# Patient Record
Sex: Female | Born: 1955
Health system: Southern US, Community
[De-identification: ages and names within clinical notes are randomized; demographics above are authoritative.]

## PROBLEM LIST (undated history)

## (undated) DIAGNOSIS — T4145XA Adverse effect of unspecified anesthetic, initial encounter: Secondary | ICD-10-CM

## (undated) DIAGNOSIS — Z9889 Other specified postprocedural states: Secondary | ICD-10-CM

## (undated) DIAGNOSIS — G839 Paralytic syndrome, unspecified: Secondary | ICD-10-CM

## (undated) DIAGNOSIS — G5620 Lesion of ulnar nerve, unspecified upper limb: Secondary | ICD-10-CM

## (undated) DIAGNOSIS — H269 Unspecified cataract: Secondary | ICD-10-CM

## (undated) DIAGNOSIS — F329 Major depressive disorder, single episode, unspecified: Secondary | ICD-10-CM

## (undated) DIAGNOSIS — G14 Postpolio syndrome: Secondary | ICD-10-CM

## (undated) DIAGNOSIS — R2241 Localized swelling, mass and lump, right lower limb: Secondary | ICD-10-CM

## (undated) DIAGNOSIS — G709 Myoneural disorder, unspecified: Secondary | ICD-10-CM

## (undated) DIAGNOSIS — M359 Systemic involvement of connective tissue, unspecified: Secondary | ICD-10-CM

## (undated) DIAGNOSIS — F32A Depression, unspecified: Secondary | ICD-10-CM

## (undated) DIAGNOSIS — G5600 Carpal tunnel syndrome, unspecified upper limb: Secondary | ICD-10-CM

## (undated) DIAGNOSIS — Z8781 Personal history of (healed) traumatic fracture: Secondary | ICD-10-CM

## (undated) DIAGNOSIS — M8580 Other specified disorders of bone density and structure, unspecified site: Secondary | ICD-10-CM

## (undated) DIAGNOSIS — M542 Cervicalgia: Secondary | ICD-10-CM

## (undated) DIAGNOSIS — K219 Gastro-esophageal reflux disease without esophagitis: Secondary | ICD-10-CM

## (undated) DIAGNOSIS — E785 Hyperlipidemia, unspecified: Secondary | ICD-10-CM

## (undated) DIAGNOSIS — R011 Cardiac murmur, unspecified: Secondary | ICD-10-CM

## (undated) DIAGNOSIS — M1990 Unspecified osteoarthritis, unspecified site: Secondary | ICD-10-CM

## (undated) DIAGNOSIS — I2510 Atherosclerotic heart disease of native coronary artery without angina pectoris: Secondary | ICD-10-CM

## (undated) DIAGNOSIS — Z780 Asymptomatic menopausal state: Secondary | ICD-10-CM

## (undated) DIAGNOSIS — J45998 Other asthma: Secondary | ICD-10-CM

## (undated) DIAGNOSIS — Z952 Presence of prosthetic heart valve: Secondary | ICD-10-CM

## (undated) DIAGNOSIS — I350 Nonrheumatic aortic (valve) stenosis: Secondary | ICD-10-CM

## (undated) DIAGNOSIS — G4700 Insomnia, unspecified: Secondary | ICD-10-CM

## (undated) DIAGNOSIS — F419 Anxiety disorder, unspecified: Secondary | ICD-10-CM

## (undated) DIAGNOSIS — Z9989 Dependence on other enabling machines and devices: Secondary | ICD-10-CM

## (undated) DIAGNOSIS — G4733 Obstructive sleep apnea (adult) (pediatric): Secondary | ICD-10-CM

## (undated) DIAGNOSIS — K5730 Diverticulosis of large intestine without perforation or abscess without bleeding: Secondary | ICD-10-CM

## (undated) DIAGNOSIS — K5792 Diverticulitis of intestine, part unspecified, without perforation or abscess without bleeding: Secondary | ICD-10-CM

## (undated) DIAGNOSIS — G4730 Sleep apnea, unspecified: Secondary | ICD-10-CM

## (undated) DIAGNOSIS — T7840XA Allergy, unspecified, initial encounter: Secondary | ICD-10-CM

## (undated) DIAGNOSIS — R609 Edema, unspecified: Secondary | ICD-10-CM

## (undated) DIAGNOSIS — R112 Nausea with vomiting, unspecified: Secondary | ICD-10-CM

## (undated) DIAGNOSIS — T8859XA Other complications of anesthesia, initial encounter: Secondary | ICD-10-CM

## (undated) HISTORY — DX: Asymptomatic menopausal state: Z78.0

## (undated) HISTORY — PX: APPENDECTOMY: SHX54

## (undated) HISTORY — PX: ANKLE FUSION: SHX881

## (undated) HISTORY — DX: Lesion of ulnar nerve, unspecified upper limb: G56.20

## (undated) HISTORY — DX: Allergy, unspecified, initial encounter: T78.40XA

## (undated) HISTORY — PX: UMBILICAL HERNIA REPAIR: SHX196

## (undated) HISTORY — DX: Postpolio syndrome: G14

## (undated) HISTORY — DX: Anxiety disorder, unspecified: F41.9

## (undated) HISTORY — DX: Hyperlipidemia, unspecified: E78.5

## (undated) HISTORY — DX: Carpal tunnel syndrome, unspecified upper limb: G56.00

## (undated) HISTORY — DX: Other asthma: J45.998

## (undated) HISTORY — DX: Unspecified cataract: H26.9

## (undated) HISTORY — DX: Cervicalgia: M54.2

## (undated) HISTORY — DX: Systemic involvement of connective tissue, unspecified: M35.9

## (undated) HISTORY — DX: Insomnia, unspecified: G47.00

## (undated) HISTORY — DX: Edema, unspecified: R60.9

## (undated) HISTORY — PX: KNEE ARTHROPLASTY: SHX992

## (undated) HISTORY — DX: Personal history of (healed) traumatic fracture: Z87.81

## (undated) HISTORY — PX: COLONOSCOPY: SHX174

## (undated) HISTORY — DX: Diverticulitis of intestine, part unspecified, without perforation or abscess without bleeding: K57.92

## (undated) HISTORY — DX: Unspecified osteoarthritis, unspecified site: M19.90

## (undated) HISTORY — PX: CHOLECYSTECTOMY: SHX55

## (undated) HISTORY — PX: LEG SURGERY: SHX1003

## (undated) HISTORY — DX: Diverticulosis of large intestine without perforation or abscess without bleeding: K57.30

## (undated) HISTORY — DX: Sleep apnea, unspecified: G47.30

## (undated) HISTORY — PX: TOTAL HIP ARTHROPLASTY: SHX124

## (undated) HISTORY — DX: Depression, unspecified: F32.A

## (undated) HISTORY — DX: Other specified disorders of bone density and structure, unspecified site: M85.80

## (undated) HISTORY — DX: Major depressive disorder, single episode, unspecified: F32.9

## (undated) HISTORY — DX: Gastro-esophageal reflux disease without esophagitis: K21.9

---
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
## 1999-04-02 ENCOUNTER — Encounter: Payer: Self-pay | Admitting: Orthopedic Surgery

## 1999-04-06 ENCOUNTER — Encounter: Payer: Self-pay | Admitting: Orthopedic Surgery

## 1999-04-06 ENCOUNTER — Inpatient Hospital Stay (HOSPITAL_COMMUNITY): Admission: RE | Admit: 1999-04-06 | Discharge: 1999-04-09 | Payer: Self-pay | Admitting: Orthopedic Surgery

## 1999-04-09 ENCOUNTER — Inpatient Hospital Stay (HOSPITAL_COMMUNITY)
Admission: RE | Admit: 1999-04-09 | Discharge: 1999-04-21 | Payer: Self-pay | Admitting: Physical Medicine & Rehabilitation

## 2000-01-14 ENCOUNTER — Emergency Department (HOSPITAL_COMMUNITY): Admission: EM | Admit: 2000-01-14 | Discharge: 2000-01-14 | Payer: Self-pay | Admitting: Emergency Medicine

## 2000-01-14 ENCOUNTER — Encounter: Payer: Self-pay | Admitting: Emergency Medicine

## 2000-03-29 ENCOUNTER — Other Ambulatory Visit: Admission: RE | Admit: 2000-03-29 | Discharge: 2000-03-29 | Payer: Self-pay | Admitting: Internal Medicine

## 2000-10-05 ENCOUNTER — Inpatient Hospital Stay (HOSPITAL_COMMUNITY): Admission: EM | Admit: 2000-10-05 | Discharge: 2000-10-07 | Payer: Self-pay | Admitting: Emergency Medicine

## 2000-10-05 ENCOUNTER — Encounter: Payer: Self-pay | Admitting: Emergency Medicine

## 2000-10-05 ENCOUNTER — Encounter: Payer: Self-pay | Admitting: Orthopedic Surgery

## 2001-03-23 ENCOUNTER — Observation Stay (HOSPITAL_COMMUNITY): Admission: EM | Admit: 2001-03-23 | Discharge: 2001-03-24 | Payer: Self-pay | Admitting: Emergency Medicine

## 2001-03-23 ENCOUNTER — Encounter: Payer: Self-pay | Admitting: Orthopedic Surgery

## 2001-03-23 ENCOUNTER — Encounter: Payer: Self-pay | Admitting: *Deleted

## 2001-06-14 HISTORY — PX: JOINT REPLACEMENT: SHX530

## 2001-07-05 ENCOUNTER — Encounter: Payer: Self-pay | Admitting: Orthopedic Surgery

## 2001-07-05 ENCOUNTER — Inpatient Hospital Stay (HOSPITAL_COMMUNITY): Admission: RE | Admit: 2001-07-05 | Discharge: 2001-07-08 | Payer: Self-pay | Admitting: Orthopedic Surgery

## 2001-07-08 ENCOUNTER — Inpatient Hospital Stay (HOSPITAL_COMMUNITY)
Admission: RE | Admit: 2001-07-08 | Discharge: 2001-07-14 | Payer: Self-pay | Admitting: Physical Medicine & Rehabilitation

## 2002-01-27 ENCOUNTER — Inpatient Hospital Stay (HOSPITAL_COMMUNITY): Admission: EM | Admit: 2002-01-27 | Discharge: 2002-01-30 | Payer: Self-pay | Admitting: Emergency Medicine

## 2002-01-27 ENCOUNTER — Encounter: Payer: Self-pay | Admitting: Emergency Medicine

## 2002-03-14 ENCOUNTER — Encounter: Payer: Self-pay | Admitting: Physical Medicine & Rehabilitation

## 2002-03-14 ENCOUNTER — Ambulatory Visit (HOSPITAL_COMMUNITY)
Admission: RE | Admit: 2002-03-14 | Discharge: 2002-03-14 | Payer: Self-pay | Admitting: Physical Medicine & Rehabilitation

## 2002-03-19 ENCOUNTER — Encounter
Admission: RE | Admit: 2002-03-19 | Discharge: 2002-04-11 | Payer: Self-pay | Admitting: Physical Medicine & Rehabilitation

## 2002-07-26 ENCOUNTER — Encounter: Admission: RE | Admit: 2002-07-26 | Discharge: 2002-10-24 | Payer: Self-pay | Admitting: Psychology

## 2002-09-27 ENCOUNTER — Encounter: Payer: Self-pay | Admitting: Orthopedic Surgery

## 2002-10-03 ENCOUNTER — Ambulatory Visit (HOSPITAL_COMMUNITY): Admission: RE | Admit: 2002-10-03 | Discharge: 2002-10-04 | Payer: Self-pay | Admitting: Orthopedic Surgery

## 2002-11-09 ENCOUNTER — Encounter: Payer: Self-pay | Admitting: Gastroenterology

## 2002-11-09 ENCOUNTER — Ambulatory Visit (HOSPITAL_COMMUNITY): Admission: RE | Admit: 2002-11-09 | Discharge: 2002-11-09 | Payer: Self-pay | Admitting: Gastroenterology

## 2002-11-26 ENCOUNTER — Encounter: Payer: Self-pay | Admitting: Gastroenterology

## 2002-11-26 ENCOUNTER — Ambulatory Visit (HOSPITAL_COMMUNITY): Admission: RE | Admit: 2002-11-26 | Discharge: 2002-11-26 | Payer: Self-pay | Admitting: Gastroenterology

## 2002-12-03 ENCOUNTER — Encounter
Admission: RE | Admit: 2002-12-03 | Discharge: 2003-03-03 | Payer: Self-pay | Admitting: Physical Medicine & Rehabilitation

## 2002-12-14 ENCOUNTER — Encounter
Admission: RE | Admit: 2002-12-14 | Discharge: 2002-12-14 | Payer: Self-pay | Admitting: Physical Medicine & Rehabilitation

## 2003-01-22 ENCOUNTER — Encounter
Admission: RE | Admit: 2003-01-22 | Discharge: 2003-02-20 | Payer: Self-pay | Admitting: Physical Medicine & Rehabilitation

## 2003-08-16 ENCOUNTER — Encounter
Admission: RE | Admit: 2003-08-16 | Discharge: 2003-11-14 | Payer: Self-pay | Admitting: Physical Medicine & Rehabilitation

## 2003-09-25 ENCOUNTER — Other Ambulatory Visit: Admission: RE | Admit: 2003-09-25 | Discharge: 2003-09-25 | Payer: Self-pay | Admitting: Obstetrics and Gynecology

## 2004-05-30 ENCOUNTER — Encounter: Admission: RE | Admit: 2004-05-30 | Discharge: 2004-05-30 | Payer: Self-pay | Admitting: Orthopedic Surgery

## 2004-06-23 ENCOUNTER — Ambulatory Visit (HOSPITAL_COMMUNITY): Admission: RE | Admit: 2004-06-23 | Discharge: 2004-06-23 | Payer: Self-pay | Admitting: Gastroenterology

## 2004-06-23 ENCOUNTER — Encounter (INDEPENDENT_AMBULATORY_CARE_PROVIDER_SITE_OTHER): Payer: Self-pay | Admitting: *Deleted

## 2004-07-23 ENCOUNTER — Encounter
Admission: RE | Admit: 2004-07-23 | Discharge: 2004-10-21 | Payer: Self-pay | Admitting: Physical Medicine & Rehabilitation

## 2004-07-23 ENCOUNTER — Ambulatory Visit: Payer: Self-pay | Admitting: Physical Medicine & Rehabilitation

## 2004-10-29 ENCOUNTER — Encounter
Admission: RE | Admit: 2004-10-29 | Discharge: 2005-01-27 | Payer: Self-pay | Admitting: Physical Medicine & Rehabilitation

## 2004-11-02 ENCOUNTER — Ambulatory Visit: Payer: Self-pay | Admitting: Physical Medicine & Rehabilitation

## 2004-11-11 ENCOUNTER — Inpatient Hospital Stay (HOSPITAL_COMMUNITY): Admission: EM | Admit: 2004-11-11 | Discharge: 2004-11-17 | Payer: Self-pay | Admitting: Emergency Medicine

## 2004-11-11 ENCOUNTER — Ambulatory Visit: Payer: Self-pay | Admitting: Physical Medicine & Rehabilitation

## 2004-11-17 ENCOUNTER — Ambulatory Visit: Payer: Self-pay | Admitting: Physical Medicine & Rehabilitation

## 2004-11-17 ENCOUNTER — Inpatient Hospital Stay
Admission: RE | Admit: 2004-11-17 | Discharge: 2004-11-28 | Payer: Self-pay | Admitting: Physical Medicine & Rehabilitation

## 2004-12-17 ENCOUNTER — Encounter: Admission: RE | Admit: 2004-12-17 | Discharge: 2004-12-17 | Payer: Self-pay | Admitting: Orthopedic Surgery

## 2005-03-21 ENCOUNTER — Emergency Department (HOSPITAL_COMMUNITY): Admission: EM | Admit: 2005-03-21 | Discharge: 2005-03-21 | Payer: Self-pay | Admitting: Emergency Medicine

## 2005-04-14 ENCOUNTER — Other Ambulatory Visit: Admission: RE | Admit: 2005-04-14 | Discharge: 2005-04-14 | Payer: Self-pay | Admitting: Obstetrics and Gynecology

## 2005-04-15 ENCOUNTER — Ambulatory Visit: Admission: RE | Admit: 2005-04-15 | Discharge: 2005-04-15 | Payer: Self-pay | Admitting: Orthopedic Surgery

## 2005-05-26 ENCOUNTER — Emergency Department (HOSPITAL_COMMUNITY): Admission: EM | Admit: 2005-05-26 | Discharge: 2005-05-26 | Payer: Self-pay | Admitting: Emergency Medicine

## 2005-11-15 ENCOUNTER — Encounter: Admission: RE | Admit: 2005-11-15 | Discharge: 2005-11-15 | Payer: Self-pay | Admitting: Gastroenterology

## 2005-11-22 ENCOUNTER — Ambulatory Visit (HOSPITAL_COMMUNITY): Admission: RE | Admit: 2005-11-22 | Discharge: 2005-11-22 | Payer: Self-pay | Admitting: Gastroenterology

## 2006-03-23 ENCOUNTER — Inpatient Hospital Stay (HOSPITAL_COMMUNITY): Admission: AD | Admit: 2006-03-23 | Discharge: 2006-03-23 | Payer: Self-pay | Admitting: Obstetrics & Gynecology

## 2006-03-25 ENCOUNTER — Emergency Department (HOSPITAL_COMMUNITY): Admission: EM | Admit: 2006-03-25 | Discharge: 2006-03-25 | Payer: Self-pay | Admitting: Emergency Medicine

## 2006-06-01 ENCOUNTER — Encounter: Admission: RE | Admit: 2006-06-01 | Discharge: 2006-06-01 | Payer: Self-pay | Admitting: Internal Medicine

## 2006-07-04 ENCOUNTER — Encounter: Admission: RE | Admit: 2006-07-04 | Discharge: 2006-07-04 | Payer: Self-pay | Admitting: Internal Medicine

## 2006-07-24 ENCOUNTER — Emergency Department (HOSPITAL_COMMUNITY): Admission: EM | Admit: 2006-07-24 | Discharge: 2006-07-24 | Payer: Self-pay | Admitting: Emergency Medicine

## 2006-08-08 ENCOUNTER — Emergency Department (HOSPITAL_COMMUNITY): Admission: EM | Admit: 2006-08-08 | Discharge: 2006-08-08 | Payer: Self-pay | Admitting: Emergency Medicine

## 2006-08-16 ENCOUNTER — Ambulatory Visit: Payer: Self-pay | Admitting: Critical Care Medicine

## 2006-08-19 ENCOUNTER — Ambulatory Visit (HOSPITAL_COMMUNITY): Admission: RE | Admit: 2006-08-19 | Discharge: 2006-08-19 | Payer: Self-pay | Admitting: Critical Care Medicine

## 2006-08-29 ENCOUNTER — Inpatient Hospital Stay (HOSPITAL_COMMUNITY): Admission: EM | Admit: 2006-08-29 | Discharge: 2006-09-05 | Payer: Self-pay | Admitting: Emergency Medicine

## 2006-08-29 ENCOUNTER — Encounter (INDEPENDENT_AMBULATORY_CARE_PROVIDER_SITE_OTHER): Payer: Self-pay | Admitting: Specialist

## 2006-09-01 ENCOUNTER — Ambulatory Visit: Payer: Self-pay | Admitting: Physical Medicine & Rehabilitation

## 2006-10-07 ENCOUNTER — Encounter: Admission: RE | Admit: 2006-10-07 | Discharge: 2006-10-07 | Payer: Self-pay | Admitting: Surgery

## 2006-10-17 ENCOUNTER — Encounter: Admission: RE | Admit: 2006-10-17 | Discharge: 2007-01-15 | Payer: Self-pay | Admitting: Surgery

## 2006-10-28 ENCOUNTER — Inpatient Hospital Stay (HOSPITAL_COMMUNITY): Admission: RE | Admit: 2006-10-28 | Discharge: 2006-11-04 | Payer: Self-pay | Admitting: Surgery

## 2007-06-18 ENCOUNTER — Emergency Department (HOSPITAL_COMMUNITY): Admission: EM | Admit: 2007-06-18 | Discharge: 2007-06-18 | Payer: Self-pay | Admitting: Emergency Medicine

## 2007-06-28 ENCOUNTER — Inpatient Hospital Stay (HOSPITAL_COMMUNITY): Admission: RE | Admit: 2007-06-28 | Discharge: 2007-07-05 | Payer: Self-pay | Admitting: General Surgery

## 2007-08-08 ENCOUNTER — Observation Stay (HOSPITAL_COMMUNITY): Admission: AD | Admit: 2007-08-08 | Discharge: 2007-08-10 | Payer: Self-pay

## 2008-02-27 ENCOUNTER — Ambulatory Visit (HOSPITAL_COMMUNITY): Admission: RE | Admit: 2008-02-27 | Discharge: 2008-02-27 | Payer: Self-pay | Admitting: General Surgery

## 2008-03-15 ENCOUNTER — Encounter: Admission: RE | Admit: 2008-03-15 | Discharge: 2008-03-15 | Payer: Self-pay | Admitting: General Surgery

## 2008-07-07 ENCOUNTER — Emergency Department (HOSPITAL_COMMUNITY): Admission: EM | Admit: 2008-07-07 | Discharge: 2008-07-07 | Payer: Self-pay | Admitting: *Deleted

## 2009-02-28 ENCOUNTER — Encounter: Admission: RE | Admit: 2009-02-28 | Discharge: 2009-02-28 | Payer: Self-pay | Admitting: General Surgery

## 2009-03-05 ENCOUNTER — Encounter: Admission: RE | Admit: 2009-03-05 | Discharge: 2009-03-05 | Payer: Self-pay | Admitting: General Surgery

## 2009-03-10 ENCOUNTER — Encounter (INDEPENDENT_AMBULATORY_CARE_PROVIDER_SITE_OTHER): Payer: Self-pay | Admitting: General Surgery

## 2009-03-10 ENCOUNTER — Ambulatory Visit (HOSPITAL_COMMUNITY): Admission: RE | Admit: 2009-03-10 | Discharge: 2009-03-11 | Payer: Self-pay | Admitting: General Surgery

## 2009-09-15 ENCOUNTER — Encounter: Admission: RE | Admit: 2009-09-15 | Discharge: 2009-12-14 | Payer: Self-pay | Admitting: Internal Medicine

## 2009-10-08 ENCOUNTER — Ambulatory Visit: Payer: Self-pay | Admitting: Cardiology

## 2009-10-08 ENCOUNTER — Emergency Department (HOSPITAL_COMMUNITY): Admission: EM | Admit: 2009-10-08 | Discharge: 2009-10-09 | Payer: Self-pay | Admitting: Emergency Medicine

## 2009-10-09 ENCOUNTER — Encounter (INDEPENDENT_AMBULATORY_CARE_PROVIDER_SITE_OTHER): Payer: Self-pay | Admitting: Emergency Medicine

## 2010-06-03 ENCOUNTER — Inpatient Hospital Stay (HOSPITAL_COMMUNITY)
Admission: EM | Admit: 2010-06-03 | Discharge: 2010-06-05 | Payer: Self-pay | Source: Home / Self Care | Attending: Internal Medicine | Admitting: Internal Medicine

## 2010-07-07 ENCOUNTER — Encounter
Admission: RE | Admit: 2010-07-07 | Discharge: 2010-07-14 | Payer: Self-pay | Source: Home / Self Care | Attending: Orthopedic Surgery | Admitting: Orthopedic Surgery

## 2010-07-11 ENCOUNTER — Encounter
Admission: RE | Admit: 2010-07-11 | Discharge: 2010-07-11 | Payer: Self-pay | Source: Home / Self Care | Attending: Orthopedic Surgery | Admitting: Orthopedic Surgery

## 2010-08-24 LAB — HEPATIC FUNCTION PANEL
ALT: 18 U/L (ref 0–35)
AST: 22 U/L (ref 0–37)
Alkaline Phosphatase: 80 U/L (ref 39–117)
Bilirubin, Direct: 0.1 mg/dL (ref 0.0–0.3)
Indirect Bilirubin: 1 mg/dL — ABNORMAL HIGH (ref 0.3–0.9)

## 2010-08-24 LAB — CBC
HCT: 39 % (ref 36.0–46.0)
HCT: 45.7 % (ref 36.0–46.0)
Hemoglobin: 13 g/dL (ref 12.0–15.0)
Hemoglobin: 15.5 g/dL — ABNORMAL HIGH (ref 12.0–15.0)
MCH: 31.6 pg (ref 26.0–34.0)
MCH: 31.9 pg (ref 26.0–34.0)
MCHC: 33.3 g/dL (ref 30.0–36.0)
MCV: 94 fL (ref 78.0–100.0)
MCV: 94.7 fL (ref 78.0–100.0)
Platelets: 311 10*3/uL (ref 150–400)
RBC: 4.12 MIL/uL (ref 3.87–5.11)
RBC: 4.86 MIL/uL (ref 3.87–5.11)
WBC: 13 10*3/uL — ABNORMAL HIGH (ref 4.0–10.5)

## 2010-08-24 LAB — DIFFERENTIAL
Basophils Absolute: 0 10*3/uL (ref 0.0–0.1)
Eosinophils Absolute: 0.3 10*3/uL (ref 0.0–0.7)
Eosinophils Relative: 2 % (ref 0–5)
Lymphocytes Relative: 18 % (ref 12–46)
Lymphs Abs: 2.3 10*3/uL (ref 0.7–4.0)
Monocytes Absolute: 1 10*3/uL (ref 0.1–1.0)
Monocytes Relative: 8 % (ref 3–12)

## 2010-08-24 LAB — POCT I-STAT, CHEM 8
BUN: 10 mg/dL (ref 6–23)
Calcium, Ion: 1.03 mmol/L — ABNORMAL LOW (ref 1.12–1.32)
Chloride: 105 mEq/L (ref 96–112)
Creatinine, Ser: 0.7 mg/dL (ref 0.4–1.2)
Glucose, Bld: 124 mg/dL — ABNORMAL HIGH (ref 70–99)
Hemoglobin: 15.3 g/dL — ABNORMAL HIGH (ref 12.0–15.0)
Potassium: 4.1 mEq/L (ref 3.5–5.1)

## 2010-08-24 LAB — URINALYSIS, ROUTINE W REFLEX MICROSCOPIC
Glucose, UA: NEGATIVE mg/dL
Hgb urine dipstick: NEGATIVE
Ketones, ur: NEGATIVE mg/dL
Protein, ur: NEGATIVE mg/dL
pH: 5.5 (ref 5.0–8.0)

## 2010-08-24 LAB — COMPREHENSIVE METABOLIC PANEL
BUN: 6 mg/dL (ref 6–23)
CO2: 26 mEq/L (ref 19–32)
Calcium: 8.3 mg/dL — ABNORMAL LOW (ref 8.4–10.5)
Chloride: 106 mEq/L (ref 96–112)
Creatinine, Ser: 0.66 mg/dL (ref 0.4–1.2)
GFR calc non Af Amer: >60 mL/min (ref >60)
Glucose, Bld: 124 mg/dL — ABNORMAL HIGH (ref 70–99)
Total Bilirubin: 0.6 mg/dL (ref 0.3–1.2)

## 2010-09-01 LAB — DIFFERENTIAL
Basophils Absolute: 0 10*3/uL (ref 0.0–0.1)
Basophils Relative: 1 % (ref 0–1)
Eosinophils Absolute: 0.3 K/uL (ref 0.0–0.7)
Eosinophils Relative: 4 % (ref 0–5)
Lymphocytes Relative: 31 % (ref 12–46)
Lymphs Abs: 2.3 10*3/uL (ref 0.7–4.0)
Monocytes Absolute: 0.5 10*3/uL (ref 0.1–1.0)
Monocytes Relative: 7 % (ref 3–12)
Neutro Abs: 4.3 10*3/uL (ref 1.7–7.7)
Neutrophils Relative %: 57 % (ref 43–77)

## 2010-09-01 LAB — POCT CARDIAC MARKERS
CKMB, poc: 2.3 ng/mL (ref 1.0–8.0)
CKMB, poc: 3 ng/mL (ref 1.0–8.0)
CKMB, poc: 3 ng/mL (ref 1.0–8.0)
Myoglobin, poc: 128 ng/mL (ref 12–200)
Myoglobin, poc: 139 ng/mL (ref 12–200)
Myoglobin, poc: 158 ng/mL (ref 12–200)
Troponin i, poc: <0.05 ng/mL (ref 0.00–0.09)
Troponin i, poc: <0.05 ng/mL (ref 0.00–0.09)
Troponin i, poc: <0.05 ng/mL (ref 0.00–0.09)

## 2010-09-01 LAB — CBC
HCT: 45.6 % (ref 36.0–46.0)
Hemoglobin: 16 g/dL — ABNORMAL HIGH (ref 12.0–15.0)
MCHC: 35.2 g/dL (ref 30.0–36.0)
MCV: 92.7 fL (ref 78.0–100.0)
Platelets: 288 K/uL (ref 150–400)
RBC: 4.92 MIL/uL (ref 3.87–5.11)
RDW: 12.9 % (ref 11.5–15.5)
WBC: 7.5 10*3/uL (ref 4.0–10.5)

## 2010-09-01 LAB — POCT I-STAT, CHEM 8
BUN: 10 mg/dL (ref 6–23)
Calcium, Ion: 1.08 mmol/L — ABNORMAL LOW (ref 1.12–1.32)
Chloride: 108 mEq/L (ref 96–112)
Creatinine, Ser: 0.3 mg/dL — ABNORMAL LOW (ref 0.4–1.2)
Glucose, Bld: 101 mg/dL — ABNORMAL HIGH (ref 70–99)
HCT: 49 % — ABNORMAL HIGH (ref 36.0–46.0)
Hemoglobin: 16.7 g/dL — ABNORMAL HIGH (ref 12.0–15.0)
Potassium: 4.2 meq/L (ref 3.5–5.1)
Sodium: 140 meq/L (ref 135–145)
TCO2: 24 mmol/L (ref 0–100)

## 2010-09-18 LAB — CBC
HCT: 43.2 % (ref 36.0–46.0)
Hemoglobin: 14.8 g/dL (ref 12.0–15.0)
MCHC: 34.3 g/dL (ref 30.0–36.0)
MCV: 93.9 fL (ref 78.0–100.0)
Platelets: 336 K/uL (ref 150–400)
RBC: 4.6 MIL/uL (ref 3.87–5.11)
RDW: 13 % (ref 11.5–15.5)
WBC: 7.7 K/uL (ref 4.0–10.5)

## 2010-09-18 LAB — COMPREHENSIVE METABOLIC PANEL
Alkaline Phosphatase: 91 U/L (ref 39–117)
BUN: 10 mg/dL (ref 6–23)
Chloride: 103 mEq/L (ref 96–112)
Glucose, Bld: 99 mg/dL (ref 70–99)
Potassium: 4 mEq/L (ref 3.5–5.1)
Total Bilirubin: 0.6 mg/dL (ref 0.3–1.2)

## 2010-09-18 LAB — DIFFERENTIAL
Basophils Absolute: 0 K/uL (ref 0.0–0.1)
Basophils Relative: 0 % (ref 0–1)
Eosinophils Absolute: 0.3 K/uL (ref 0.0–0.7)
Eosinophils Relative: 4 % (ref 0–5)
Lymphocytes Relative: 30 % (ref 12–46)
Lymphs Abs: 2.3 K/uL (ref 0.7–4.0)
Monocytes Absolute: 0.5 K/uL (ref 0.1–1.0)
Monocytes Relative: 6 % (ref 3–12)
Neutro Abs: 4.6 K/uL (ref 1.7–7.7)
Neutrophils Relative %: 59 % (ref 43–77)

## 2010-10-27 NOTE — Op Note (Signed)
NAMECALLE, SCHADER             ACCOUNT NO.:  [id number]   MEDICAL RECORD NO.:  [id number]          PATIENT TYPE:  INP   LOCATION:  0003                         FACILITY:  [facility]   PHYSICIAN:  Sharlet Salina T. Hoxworth, M.D.DATE OF BIRTH:  12-07-1955   DATE OF PROCEDURE:  06/28/2007  DATE OF DISCHARGE:                               OPERATIVE REPORT   PREOPERATIVE DIAGNOSIS:  Recurrent ventral incisional hernia.   POSTOPERATIVE DIAGNOSIS:  Recurrent ventral incisional hernia.   SURGICAL PROCEDURES:  Repair of recurrent ventral incisional hernia.   SURGEON:  Lorne Skeens. Hoxworth, M.D.   ANESTHESIA:  General.   BRIEF HISTORY:  [name] is a 55 year old female with  comorbidities of postpolio syndrome and obesity, who is status post  emergency appendectomy requiring an open incision.  Early on following  this procedure, she developed wound dehiscence that was repaired with  mesh by Dr. Daphine Deutscher.  She did well for a number of months, but now  presents with a painful recurrent bulge in the right lower quadrant of  her abdomen.  Examination reveals a fullness and tenderness here and CT  scan has shown a recurrent ventral hernia containing fat with no bowel  or obstruction.  I have recommended proceeding with repair using mesh.  The nature of the procedure, indications, risks of bleeding, infection,  recurrence and cardiorespiratory complications have been discussed and  understood.  She is now brought to the operating room for this  procedure.   DESCRIPTION OF OPERATION:  The patient was brought to the operating room  and placed in supine position on the operating table and general  endotracheal anesthesia was induced.  She received preoperative IV  antibiotics.  Lovenox 40 mg was given subcutaneously.  Foley catheter  was placed.  The abdomen was widely sterilely prepped and draped.  Correct patient and procedure were verified.  The previous transverse  right lower quadrant  incision was used and extended somewhat and  dissection was carried down through the subcutaneous tissue and scar.  A  hernia sac was encountered and was opened and contained omentum.  The  mesh was essentially floating on the top of the hernia sac, having come  away from the abdominal wall.  The hernia sac and mesh were completely  excised back to healthy fascial edges.  Omental adhesions were taken  down from the wound edges and then completely lysed from the abdominal  wall intra-abdominally.  There were no significant bowel adhesions.  This left a defect of approximately 5 x 6 cm in diameter.  I elected to  repair this with an intra-abdominal piece of Kugel mesh.  A 17.5 x 13.5-  cm Kugel mesh was chosen and placed intra-abdominal and oriented  transversely; this allowed coverage far back from the edges in all  directions.  The mesh was then sutured up to the abdominal wall  internally with a number of #1 Prolene pop-offs circumferentially around  the edge of the mesh.  Following this, the Endo tacker was then used to  further secure the edge of the mesh up to the anterior abdominal wall  circumferentially with  no gaps remaining.  Again, this provided nice  broad coverage a number centimeters back in all directions.  Following  this, the fascia closed without any tension over the mesh and this was  performed with running #1 PDS, incorporating the mesh into a few bites  of the suture to close any dead space and anchor the center of the mesh.  The subcu tissue was  irrigated with antibiotic solution.  The subcu was closed with a running  2-0 Monocryl.  Skin was closed with staples.  Sponge, needle and  instrument count were correct.  Dry sterile dressing and a binder were  placed and the patient taken to Recovery in good condition.      Lorne Skeens. Hoxworth, M.D.  Electronically Signed     BTH/MEDQ  D:  06/28/2007  T:  06/28/2007  Job:  [id number]

## 2010-10-27 NOTE — Discharge Summary (Signed)
[name]             ACCOUNT NO.:  [id number]   MEDICAL RECORD NO.:  [id number]          PATIENT TYPE:  INP   LOCATION:  1536                         FACILITY:  [facility]   PHYSICIAN:  Sharlet Salina T. Hoxworth, M.D.DATE OF BIRTH:  July 01, 1955   DATE OF ADMISSION:  06/28/2007  DATE OF DISCHARGE:  07/05/2007                               DISCHARGE SUMMARY   DISCHARGE DIAGNOSIS:  Recurrent ventral incisional hernia.   SURGICAL PROCEDURES:  Repair of recurrent ventral incisional hernia on  January 14.   HISTORY OF PRESENT ILLNESS:  [name] is a 55 year old female  with post polio syndrome and limited use of her lower extremities.  She  had an open appendectomy with Dr. Daphine Deutscher in March 2008 and developed an  early incisional hernia, repaired open with mesh in May 2008.  About 2  or 3 weeks prior to this admission, when transferring herself, which  requires a fair amount of abdominal force, she felt the sudden onset of  an uncomfortable bulge in her right lower quadrant which has been  prominent ever since.  CT scan was performed when she presented to the  emergency room, which showed a recurrent incisional hernia in the right  lower quadrant.  Examination in the office confirmed this and she is now  electively admitted for repair.   PAST MEDICAL HISTORY:  History of polio as an infant with post polio  syndrome.  She has essentially complete paralysis of her left lower  extremity and very limited use of her right lower extremity due to hip  fusion and multiple procedures.  Other medical problems include obesity,  mild depression.  Previous surgery includes 3 hip replacements on the  right, 2 ankle fusions of the left lower extremity, 4 knee surgeries of  the left lower extremity, and as described in the HPI.   Medications are Cymbalta 60 daily, trazodone 150 at night.   ALLERGIES:  SULFA ANTIBIOTIC.   For social history, family history, review of systems, see detailed  H&P.   PERTINENT PHYSICAL EXAMINATION:  VITAL SIGNS:  Afebrile.  Vital signs  within normal limits.  GENERAL:  Obese female in no acute distress.  ABDOMEN:  Shows a well-healed right lower quadrant incision with a  fairly large palpable mass at the abdominal wall level.   CT scan has shown a large recurrent ventral incisional hernia in the  right lower quadrant containing omentum, preperitoneal fat with no  bowel.   HOSPITAL COURSE:  The patient was admitted on the morning of her  procedure and underwent open repair of her recurrent ventral hernia with  intra-abdominal composite, Kugel mesh.  She tolerated the procedure  well.  There were no complications postoperatively.  Her discharge was  somewhat slow due to her post polio syndrome and difficulty with  mobility.  She received some help from physical therapy and gradually  was able to become mobile on transfer  in and out of bed to her chair.  Her wound is healing primarily.  She is  discharged home on July 04, 2007.  Discharge medications are the same  as admission plus  Tylox for pain.  Followup will be in my office in 2 to  3 weeks.      Lorne Skeens. Hoxworth, M.D.  Electronically Signed     BTH/MEDQ  D:  07/27/2007  T:  07/28/2007  Job:  [id number]

## 2010-10-27 NOTE — Discharge Summary (Signed)
[name], [name]             ACCOUNT NO.:  [id number]   MEDICAL RECORD NO.:  [id number]          PATIENT TYPE:  OBV   LOCATION:  6706                         FACILITY:  [facility]   PHYSICIAN:  Hettie Holstein, D.O.    DATE OF BIRTH:  03/14/1956   DATE OF ADMISSION:  08/08/2007  DATE OF DISCHARGE:  08/10/2007                               DISCHARGE SUMMARY   PRIMARY CARE PHYSICIAN:  Cala Bradford R. Renae Gloss, M.D.   FINAL DIAGNOSES:  1. Inability to perform ADLs due to severe tendonitis and underlying      physical disabilities.  2. 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  3. History of anxiety syndrome.  4. Cervical radiculopathy.  5. Right arm tendonitis, due to overuse.  6. Recent abdominal surgeries over the past 10 months, relating to      recurrent ventral incisional hernia following a complicated course      for acute appendicitis in March 2008.   HISTORY OF PRESENT ILLNESS:  Please refer to the full history and  physical, as dictated by Dr. Andi Devon on August 08, 2007.  However briefly, [name] was admitted directly to 6700 unit from  Dr. Augustin Schooling office, with full H&P and orders on the record  for an observation stay; due to [name]'s inability to perform her  ADLs due to overuse syndrome and tendonitis of her right arm; followed  in the outpatient setting by Dr. Penni Bombard as well as Dr. Renae Gloss.  Symptoms have been refractory and difficult to manage, due to her  wheelchair-bound status and overall dependence on this upper extremity  for the performance of her ADLs.   HOSPITAL COURSE:  [name] was admitted with admission orders from  the office.  In any event, we are asked to follow her daily course, and  this was essentially uneventful.  Her arm remained in a sling.  We  requested the assistance of and evaluation from physical therapy,  occupational therapy.  At this juncture, she is scheduled  for transition  to [facility].   MEDICATIONS:  1. Prilosec 40 mg one to two daily.  2. Ibuprofen 800 mg p.o. t.i.d. p.r.n.  3. Darvocet N 100 one to two tablets p.o. q.6 h. p.r.n.  4. Cymbalta 60 mg daily.  5. Desyrel 150 mg p.o. q.h.s.  6. She states she is on an over-the-counter medication Alizn, which      she can also continue on a daily basis.     Hettie Holstein, D.O.  Electronically Signed    ESS/MEDQ  D:  08/10/2007  T:  08/10/2007  Job:  [id number]   cc:   Merlene Laughter. Renae Gloss, M.D.

## 2010-10-30 NOTE — Op Note (Signed)
[facility]  Patient:    [name], [name] Visit Number: [id number] MRN: [id number]          Service Type: [facility] Location: [id number] Attending Physician:  Faith Rogue T Dictated by:   Ollen Gross, M.D. Proc. Date: 07/05/01 Admit Date:  07/08/2001                             Operative Report  PREOPERATIVE DIAGNOSIS:  Recurrent dislocation, right hip.  POSTOPERATIVE DIAGNOSIS:  Recurrent dislocation, right hip.  OPERATION PERFORMED:  Right acetabular revision to constrained system.  SURGEON:  Ollen Gross, M.D.  ASSISTANT:  ANESTHESIA:  General.  ESTIMATED BLOOD LOSS:  800 cc.  DRAINS:  Hemovac x 1.  COMPLICATIONS:  None.  CONDITION:  Stable to recovery.  INDICATIONS FOR PROCEDURE:  [name] is a 55 year old female who has postpolio syndrome in the right lower extremity.  She initially had a total hip done by Dr. Simonne Gonzales in 1992 and subsequently had acetabular polyethylene wear and femoral loosening requiring a femoral revision three years ago. The acetabulum was not changed at that point.  She has had two dislocations secondary to increased weakness in the leg.  She presents now for revision.  DESCRIPTION OF PROCEDURE:  After successful administration of general anesthetic, the patient was placed in left lateral decubitus position with the right side up and help with hip positioner.   The right lower extremity was isolated from the perineum with plastic drapes and prepped and draped in the usual sterile fashion.  A posterolateral incision was made with a 10 blade through the subcutaneous tissue to the level of the fascia lata which was incised in line with the skin incision.  The sciatic nerve was palpated and protected.  Charnley retractor placed and soft tissue isolated off the posterior femur.  The pseudocapsule was excised to get circumferential exposure around the acetabulum.  The hip was dislocated and the femoral  head removed.  The whole thing was retracted anteriorly.  We removed the acetabular liner and the cut was really in excellent position.  Given that it is only a 48 mm cup, it is too small for a constrained liner and thus we had to go to a larger head with a thin liner or revise the entire acetabular shell to a larger one and put a constrained liner.  Given her significant motor muscle weakness, I decided to go ahead and removed the metal shell and replace it with a new one and put the constrained liner in.  The shell was circumferentially exposed and then the Moreland osteotomes were used to disrupt the bone prosthesis interface and the prosthesis was removed.  There was minimal bone loss.  She does have a small central defect but the anterior and posterior columns were fine.  We then reamed the rim up to 58 mm.  We used central allograft bone grafting for the acetabular floor and then I put a 56 mm reamer in reverse which led to a great concentric acetabular appearance. The 58 mm PSL acetabular shell was then impacted into anatomic position and transfixed with four domed screws. We had a great stable fit.  The constrained liner for the 28 mm head was then impacted into the acetabular shell and found to be well seated.  We then put a 28 +0 head on to her SROM femoral stem and reduced it.  There was no evidence of any torquing  of the composite.  The wound was then copiously irrigated.  Fascia lata closed over the Hemovac drain with #1 Vicryl subcutaneous closed with #1 and 2-0 Vicryl.  Skin closed with staples.  Drains left to suction.  Incision was clean and dry and a sterile bulky dressing was applied.  The patient was subsequently awakened and transported to recovery in stable condition. Dictated by:   Ollen Gross, M.D. Attending Physician:  Faith Rogue T DD:  07/05/01 TD:  07/06/01 Job: [id number]

## 2010-10-30 NOTE — Assessment & Plan Note (Signed)
[facility]                             PULMONARY OFFICE NOTE   NAME:Helmuth, CHIVONNE                      MRN:          [id number]  DATE:08/16/2006                            DOB:          12/08/1955    CHIEF COMPLAINT:  Evaluate dyspnea.   HISTORY OF PRESENT ILLNESS:  A 55 year old, white, female, wheelchair-  bound patient with post-polio syndrome whose had dyspnea with exertion  and at rest and a productive cough. The symptoms began in November of  2007 after an upper respiratory tract infection and there has been no  change every since. Initially she was treated with prednisone,  antibiotics and cough syrup, later given an inhaler Spiriva. Two rounds  of prednisone were 1 week and 2 weeks respectively. No change with this.  She did have increased acid symptomatology and took Prevacid regularly  on a once daily basis. She is still having breakthrough reflux with the  Prevacid despite being on this and having her head of bed elevated. She  has been worked up by Dr. Charna Elizabeth in the past for esophageal  dysmotility and dysphagia. She has had an informal swallowing evaluation  in the past about a year ago but none recently. She has been wheelchair-  bound because of her post-poli syndrome. She had polio at the age of 1  and had recurrence of her symptoms in 9 with increased fatigue. She  has had no real dietary changes. She has headaches, sneezing, itching,  earaches, hand and foot swelling. She is referred for further  evaluation. She is a lifelong never smoker.   PAST MEDICAL HISTORY:  No history of hypertension, heart disease,  asthma, diabetes, hypercholesterolemia, stroke or other significant  abnormalities. The patient is noted to have increased hypersomnolence  and excess snoring is noted.   PAST SURGICAL HISTORY:  Eight left leg surgeries in the past and 3  surgeries on the right leg and hip replacements are noted.   MEDICATION  ALLERGIES:  SULFA DRUGS.   CURRENT MEDICATIONS:  1. Cymbalta 60 mg daily.  2. Prevacid 30 mg daily.  3. Prempro daily.  4. Trazodone 150 mg daily.   SOCIAL HISTORY:  The patient is a lifelong never smoker, lives alone,  disabled.   FAMILY HISTORY:  Asthma in her father, heart disease in her father,  mother who had breast cancer.   PHYSICAL EXAMINATION:  VITAL SIGNS:  Temperature 97.9, blood pressure  120/88, pulse 91, saturation 98% on room air.  CHEST:  Showed distant breath sounds with no wheeze or rhonchi.  CARDIAC:  Showed a regular rate and rhythm without S3, normal S1, S2.  ABDOMEN:  Soft, nontender.  EXTREMITIES:  Showed no edema, clubbing or venous disease.  SKIN:  Clear.  NEUROLOGIC:  Intact except for paresis in the lower extremities.  HEENT:  Showed nares to be clear, oropharynx clear.   LABORATORY DATA:  Chest x-ray was obtained and reviewed and showed lower  lung atelectasis with diaphragm elevation. CT scan of the chest had  already been obtained on August 09, 2006 and showed no evidence of  pulmonary embolism but a small amount of right lung atelectasis.  Pulmonary functions are not available.   IMPRESSION:  1. Hypersomnolence, rule out obstructive sleep apnea with morbid      obesity.  2. Rule out chronic aspiration with dysphagia and bulbar dysfunction      or post-polio syndrome.  3. Significant acid reflux with vocal cord dysfunction syndrome likely      contributing to the levels of dyspnea.   RECOMMENDATIONS:  Obtain a full sleep study, modified barium swallow,  full pulmonary function studies and switch Prevacid to Zegerid at 40 mg  daily. Once the results of the above studies are available, further  recommendations will follow.     Charlcie Cradle Delford Field, MD, [facility]  Electronically Signed    PEW/MedQ  DD: 08/17/2006  DT: 08/17/2006  Job #: [id number]   cc:   Merlene Laughter. Renae Gloss, M.D.

## 2010-10-30 NOTE — Discharge Summary (Signed)
[name], [name]             ACCOUNT NO.:  [id number]   MEDICAL RECORD NO.:  [id number]          PATIENT TYPE:  INP   LOCATION:  1612                         FACILITY:  [facility]   PHYSICIAN:  Alfonse Ras, MD   DATE OF BIRTH:  06-Sep-1955   DATE OF ADMISSION:  10/28/2006  DATE OF DISCHARGE:  11/04/2006                               DISCHARGE SUMMARY   ADMISSION DIAGNOSIS:  Incisional hernia.   DISPOSITION:  Discharged to home.   CONDITION:  Improved.   HOSPITAL COURSE:  The patient was admitted and underwent laparoscopic-  assisted right lower quadrant incisional hernia repair.  The patient  suffers from postpolio syndrome and so was maintained in the hospital  for physical therapy to help her mobilize.  She was tolerating a regular  diet.  IV fluids have been removed.  She was tolerating p.o. pain  medication and by postoperative day #7, she was ready for discharge  home.  She can follow up with Dr. Daphine Deutscher in 2 weeks.      Alfonse Ras, MD  Electronically Signed     KRE/MEDQ  D:  11/04/2006  T:  11/04/2006  Job:  [id number]

## 2010-10-30 NOTE — Op Note (Signed)
NAMEVIKTORIA, Gonzales             ACCOUNT NO.:  [id number]   MEDICAL RECORD NO.:  [id number]          PATIENT TYPE:  INP   LOCATION:  5727                         FACILITY:  [facility]   PHYSICIAN:  Thornton Park. Daphine Deutscher, MD  DATE OF BIRTH:  [birth year]   DATE OF PROCEDURE:  08/29/2006  DATE OF DISCHARGE:                               OPERATIVE REPORT   PREOPERATIVE DIAGNOSIS:  Acute retrocecal appendicitis.   POSTOPERATIVE DIAGNOSIS:  Acute retrocecal appendicitis.   PROCEDURE:  Laparoscopic appendectomy followed by open right lower  quadrant laparotomy to expose and control retrocecal bleeding.   SURGEON:  Thornton Park. Daphine Deutscher, MD   ANESTHESIA:  General endotracheal.   ESTIMATED BLOOD LOSS:  600 mL cumulative.   DESCRIPTION OF PROCEDURE:  [name] is a 55 year old lady with post  polio syndrome who had about a day long history of increasing abdominal  pain and a retrocecal appendix seen on CT scan.  It looked like it went  more medial and up toward the right kidney.  I prepped her with [facility] and draped her sterilely.  I went through the umbilicus and through  a little hernia that I found.  I out the Hasson in. I insufflated and  placed a five in the right upper quadrant and 11 obliquely in the left  lower quadrant.  I then went over to the cecum and grasped it and  followed it to the base and found the base of the appendix and then with  gentle traction began mobilizing it.  I came down along the side with a  Harmonic and did begin mobilizing it and then found my way to create a  window below the stump.  When I did that I then was able to fire across  the stump with the Endo-GIA.   With the stump of the appendix elevated.  I then went to the mesentery.  This where it was very fatty and very stuck posteriorly and as I was  coming through with the Harmonic I began getting into some active  bleeding in the retrocecal region.  I controlled this with multiple  clips and one  proved to be more elusive.  I eventually felt like I had  controlled it and continued and was able to mobilize the tip and get the  appendix out and put it in a bag.  I went back and studied it and there  was no evidence of bleeding.  I then went back and looked again, no  bleeding.  I irrigated and cleared out the hemorrhage which had been  there during the initial bleeding episode.  After I had closed my  umbilical port, I went back for a third look and there was some fresh  blood so I then mobilized it again and found that this was very  retrocecal and despite every best attempt with the 30 degrees scope and  with an extra port in the right lower quadrant, I was unable to  satisfactorily control this with the Harmonic scalpel or with clips.  I  then elected making a right lower quadrant incision.  Unfortunately, she  is obese woman and I had to create a Fowler-Weir type incision and then  once doing this, I opened and roll the cecum over and ligated two  parallel longitudinal vessels coursing what would be beneath the  appendix and appendiceal mesentery.  I also applied FloSeal and this  seemed to control the retroperitoneal hemorrhage.  I irrigated and  looked at it again and it looked dry.  I brought an omental pack down  over this region and then closed the wound with interrupted #1 Novofils.  Wounds were irrigated and closed with staples.  The patient was taken to  recovery room in satisfactory addition.      Thornton Park Daphine Deutscher, MD  Electronically Signed     MBM/MEDQ  D:  08/29/2006  T:  08/30/2006  Job:  [id number]

## 2010-10-30 NOTE — Op Note (Signed)
NAME:  [name], [name]             ACCOUNT NO.:  [id number]   MEDICAL RECORD NO.:  [id number]          PATIENT TYPE:  AMB   LOCATION:  ENDO                         FACILITY:  [facility]   PHYSICIAN:  Anselmo Rod, M.D.  DATE OF BIRTH:  02-05-1956   DATE OF PROCEDURE:  11/22/2005  DATE OF DISCHARGE:                                 OPERATIVE REPORT   PROCEDURE:  Esophagogastroduodenoscopy.   ENDOSCOPIST:  Anselmo Rod, M.D.   INSTRUMENT USED:  Olympus video panendoscope.   INDICATIONS FOR PROCEDURE:  This is a 55 year old white female with a  history of polio, undergoing an EGD for an abnormal barium swallow, question  distal esophageal stricture.  There was some question of an esophageal spasm  and dysmotility on a barium swallow.   PRE-PROCEDURE PREPARATION:  An informed consent was procured form the  patient and the patient was fasted for four hours prior to the procedure.  The risks and benefits of the procedure were discussed in great detail with  the patient, including perforation, esophageal dilatation, bleeding, etc.   PRE-PROCEDURE PHYSICAL EXAMINATION:  VITAL SIGNS:  Stable.  NECK:  Supple.  CHEST:  Clear to auscultation.  HEART:  S1 and S2 regular.  ABDOMEN:  Soft with normal bowel sounds.   DESCRIPTION OF PROCEDURE:  The patient was placed in the left lateral  decubitus position and sedated with 5 mcg of fentanyl and 5 mg of Versed in  slow incremental doses.  Once the patient was adequately sedated and  maintained on low-flow oxygen and continuous cardiac monitoring, the Olympus  video pan-endoscope was advanced through the mouth piece, over the tongue  and into the esophagus under direct vision.  The entire esophagus was widely  patent with no evidence of ring, stricture, masses or esophagitis or  Barrett's mucosa noted.  The scope was then advanced into the stomach and  the entire gastric mucosa and proximal stomach appeared normal.  Retroflexion in the high  cardia revealed no abnormalities.   IMPRESSION:  1.Normal esophagogastroduodenoscopy with a widely patent  esophagus.  No strictures, esophagitis or Barrett's mucosa noted.  2.No abnormalities identified in the esophagus.  3.Normal-appearing stomach and proximal stomach.   RECOMMENDATIONS:  1.Avoid very hot or very cold liquids.  2.If the patient's symptoms do not improve, an esophageal manometry will be  considered.  3.Outpatient followup as the need arises in the future.      Anselmo Rod, M.D.  Electronically Signed     JNM/MEDQ  D:  11/22/2005  T:  11/22/2005  Job:  [id number]   cc:   Merlene Laughter. Renae Gloss, M.D.  Fax: [phone]

## 2010-10-30 NOTE — Discharge Summary (Signed)
[name], [name]             ACCOUNT NO.:  [id number]   MEDICAL RECORD NO.:  [id number]          PATIENT TYPE:  INP   LOCATION:  5713                         FACILITY:  [facility]   PHYSICIAN:  Angelia Mould. Derrell Lolling, M.D.DATE OF BIRTH:  08-Oct-1955   DATE OF ADMISSION:  08/29/2006  DATE OF DISCHARGE:  09/05/2006                               DISCHARGE SUMMARY   FINAL DIAGNOSIS:  Acute appendicitis.   OPERATION PERFORMED:  Open appendectomy August 29, 2006.   HISTORY OF PRESENT ILLNESS:  This is a 55 year old woman who presented  with a 12-24 hours history of abdominal pain.  She has a past history of  left leg polio syndrome.  She came to the emergency room where a CT scan  suggested a retrocecal appendicitis.  She was seen by Dr. Luretha Murphy  and admitted.   PHYSICAL EXAMINATION:  VITAL SIGNS:  On physical exam she is slightly  overweight, temperature 97.0, blood pressure 137/85, pulse 80.  ABDOMEN:  Significant physical findings:  Abdomen showed tenderness and  guarding in the right lower quadrant.   ADMISSION LABORATORY DATA:  Lipase 92.  Electrolytes normal.  Hemoglobin  15.5, white blood cell count 16,500.   HOSPITAL COURSE:  The patient was admitted to the hospital by Dr.  Luretha Murphy and was taken to operating room by Dr. Daphine Deutscher and  underwent a laparoscopic appendectomy.  This had to be converted to an  open laparotomy to control bleeding behind the cecum.  The patient  stabilized thereafter.  Because of her polio syndrome, she had some  disability and inability to ambulate independently.  She had a mild  ileus for 2-3 days, but that did resolve.  We had physical  medicine/rehabilitation see her and they were involved in her care.  They initially thought that she would be a good candidate for  comprehensive inpatient rehab.   On September 02, 2006 I felt that she was doing well and she was having  bowel movements and was tolerating her diet.  We stopped her antibiotics  and felt she was medically stable for transfer to comprehensive  inpatient rehab but there really wasn't a  bed available for her.   She continued to be cared for by the [facility].  She improved slowly over the next few days and the  decision was made that she would not go to rehab but would go home.  She  was discharged on September 05, 2006.  She was given ibuprofen to take for a  mild superficial phlebitis.  Staples were removed and Steri-Strips were  applied.  Follow-up with Dr. Luretha Murphy was arranged.      Angelia Mould. Derrell Lolling, M.D.  Electronically Signed     HMI/MEDQ  D:  10/10/2006  T:  10/10/2006  Job:  [id number]

## 2010-10-30 NOTE — Op Note (Signed)
   NAME:  [name], [name]                       ACCOUNT NO.:  [id number]   MEDICAL RECORD NO.:  [id number]                   PATIENT TYPE:  OIB   LOCATION:  2899                                 FACILITY:  [facility]   PHYSICIAN:  Ollen Gross, M.D.                 DATE OF BIRTH:  1955-08-27   DATE OF PROCEDURE:  10/03/2002  DATE OF DISCHARGE:                                 OPERATIVE REPORT   PREOPERATIVE DIAGNOSIS:  Painful hardware right hip.   POSTOPERATIVE DIAGNOSIS:  Painful hardware right hip.   OPERATION:  Hardware removal right hip.   SURGEON:  Ollen Gross, M.D.   ANESTHESIA:  General   ESTIMATED BLOOD LOSS:  Minimal.   DRAINS:  Hemovac times one.   COMPLICATIONS:  None.   DISPOSITION:  Stable to recovery.   BRIEF CLINICAL NOTE:  The patient is a 55 year old female who has had  multiple hip procedures performed, primary total hip and then two revisions.  She has cerclage wires in her proximal femur and has been getting irritation  from those wires.  She presents now for hardware removal.   DESCRIPTION OF PROCEDURE:  After the successful administration of general  anesthetic, the patient was placed in the left lateral decubitus position  with the right side up and held at the hip positioner.  The right lower  extremity was isolated, prepped and draped in the usual sterile fashion.  I  palpated the area where the hardware was prominent and made a small 3 inch  incision.  Skin was cut with #10 blade through thick layer, subcutaneous  tissue and the fascia lata was incised along the skin incision.  Some bursa  fluid was encountered when entering the fascia lata.  The cables are  identified and cut with a cable cutter.  The three 18 gauge wires were  subsequently removed.  The wound was then copiously irrigated with  antibiotic solution and a Hemovac drain placed.  Fascia lata has been closed  with #1 Vicryl, subcutaneous closed with #1 and 2-0 Vicryl, and skin with  staples.  Incision clean and dry and bulky sterile dressing applied.  Drains  were hooked to suction.  She was awakened and transported to the recovery  room in stable condition.                                               Ollen Gross, M.D.    FA/MEDQ  D:  10/03/2002  T:  10/03/2002  Job:  [id number]

## 2010-10-30 NOTE — Discharge Summary (Signed)
NAME:  [name], [name]                       ACCOUNT NO.:  [id number]   MEDICAL RECORD NO.:  [id number]                   PATIENT TYPE:  INP   LOCATION:  5016                                 FACILITY:  [facility]   PHYSICIAN:  Robert A. Thomasena Edis, M.D.             DATE OF BIRTH:  Oct 26, 1955   DATE OF ADMISSION:  01/27/2002  DATE OF DISCHARGE:  01/30/2002                                 DISCHARGE SUMMARY   PRIMARY DIAGNOSIS:  Right hip pain status post fall and multiple previous  surgeries.   SECONDARY DIAGNOSIS:  Polio, left leg.   SURGICAL PROCEDURES:  None.   CONSULTATIONS:  None.   LABORATORY DATA:  X-rays of the right through the ER on admission showed  anatomic alignment and negative for fracture.  Right femur x-rays showed  right hip prosthesis intact and no acute fracture.  Right hip two views with  comparison to July 05, 2001, showed total hip replacement on the right  without change in the prosthesis from July 05, 2001.   CHIEF COMPLAINT:  Right hip pain.   HISTORY OF PRESENT ILLNESS:  The patient is a 55 year old female who had an  extensive prior surgical history in regard to her right hip.  She has  undergone total hip arthroplasties x 3 of that right hip and then sustained  a periprosthetic femur fracture in 2000 on that leg.  She has been treated  in the past by Gus Rankin. Aluisio, M.D.  On the day of admission, the patient  was at a gathering when the leg suddenly just went out beneath her.  She  reported growing pain afterwards.  The pain radiated to her back and to her  thigh.  She came to the emergency department at [facility] because of her discomfort.  X-rays demonstrated no evidence of  fracture.  Because of difficulty controlling her pain in the emergency  department, it was felt that she would be best served being admitted for  pain control.   HOSPITAL COURSE:  The patient was admitted to the orthopedic floor.  Physical therapy  and occupational therapy were consulted for progressive  ambulation and weightbearing as tolerated.  The patient had difficulty  initially getting up out of bed secondary to pain.  She was followed each  day and did start to make slow, but steady progress with new mid to mild  pain control.  On January 29, 2002, Gus Rankin. Aluisio, M.D., the patient's  regular treating orthopedist, was available and evaluated the patient.  He  notes that she has had no muscular weakness of both of her lower extremities  and felt that this was probably the cause of her fall.  He looked over the x-  rays also and felt that they looked fine.  He recommended that she follow up  with him in about two weeks after discharge.  On January 30, 2002, she had  made good progress with therapy, she had pain control, and was ready for  discharge home.   DISPOSITION:  The patient is being discharge home.   DIET:  Regular.   FOLLOW UP:  Follow up with Gus Rankin. Aluisio, M.D., in about two weeks.  Call  [phone] for an appointment.   ACTIVITY:  Weightbearing as tolerated with a walker.   DISCHARGE MEDICATIONS:  She is to resume all of her medications and was  given a prescription for Vicodin one or two every four to six hours as  needed for pain.   CONDITION ON DISCHARGE:  Good and improved.     Marcie Bal Troncale, P.A.-C.              Robert A. Thomasena Edis, M.D.    ADT/MEDQ  D:  02/08/2002  T:  02/11/2002  Job:  [id number]   cc:   Molly Maduro A. Thomasena Edis, M.D.

## 2010-10-30 NOTE — H&P (Signed)
NAMEDEMPSEY, [name]             ACCOUNT NO.:  [id number]   MEDICAL RECORD NO.:  [id number]          PATIENT TYPE:  ORB   LOCATION:  4531                         FACILITY:  [facility]   PHYSICIAN:  Erick Colace, M.D.DATE OF BIRTH:  1955/07/28   DATE OF ADMISSION:  11/17/2004  DATE OF DISCHARGE:                                HISTORY & PHYSICAL   REASON FOR ADMISSION:  Decreased mobility after left greater trochanteric  fracture status post fall, Nov 11, 2004.   This is a 55 year old female with history of post polio syndrome, chronic  left lower extremity paralysis, as well as prior hip replacements.  Went to  [facility] x3 for these.  Admitted Nov 11, 2004 with a fall while getting out of  the bathtub.  She sustained a nondisplaced left greater trochanteric  fracture.  She was seen by Dr. Despina Hick from orthopedics who advised  conservative care and weightbearing as tolerated.  She was placed in hinge  brace.  She had her Foley tube discontinued on November 15, 2004.  She is now  admitted for subacute rehabilitation.   REVIEW OF SYSTEMS:  Positive for reflux, joint swelling, and weakness in the  left lower extremity.  She has chronic left foot drop and wears and an anti-  foot drop orthosis.   Past history as noted above, also with depression.  Also, with effused ankle  x2 on the left side.   FAMILY HISTORY:  Noncontributory.   SOCIAL HISTORY:  Lives alone in [place] in one-level home with ramp to  enter.  She was wheelchair bound prior to admission except for transfers.  She wears left AFO.  Local family assist as needed.  Recurrent functional  status is min assist bed mobility and transfers.  She is nonambulatory.   MEDICATIONS PRIOR TO ADMISSION:  1.  Wellbutrin.  2.  Trazodone.  3.  Prevacid.  4.  Vicodin.   ALLERGIES:  SULFA.   CURRENT MEDICATIONS:  1.  Prempro 0.45/1.5 p.o. daily.  The patient has her home supply.  2.  Trazodone 100 mg q.h.s.  3.  Wellbutrin XL 150 mg  p.o. daily.  4.  Vicodin 5/500 one to two p.o. q.4h p.r.n. pain.  5.  Lamisil to be applied to skin folds.  6.  Tylenol p.r.n.  7.  Phenergan p.r.n.  8.  Sorbitol p.r.n.   Last hemoglobin 14.5, last white count 7.0, last BUN 10, creatinine 0.6.   PHYSICAL EXAMINATION:  VITAL SIGNS:  Blood pressure 109/73, pulse 84,  respirations 20, temperature 97.4.  GENERAL:  Moderately obese female appearing her stated age.  HEENT:  Eyes:  Anicteric, not injected.  External ENT and dentition is  normal.  NECK:  Supple without adenopathy.  LUNGS:  Respirations unlabored.  Lungs are clear.  HEART:  Regular rate and rhythm.  ABDOMEN:  Positive bowel sounds.  Soft, nontender.  Sensation is normal.  NEUROLOGIC:  Cranial nerves II-XII intact.  Memory, mood, affect, and  orientation are all normal.  Motor strength is 4/5 strength bilateral upper  extremity, deltoid, biceps, triceps, finger flexors in right lower  extremity.  Hip flexor 4, guard 4, TA 4, gastroc 4.  Left lower extremity  not tested.  States that she cannot move this lower extremity since her  polio.   IMPRESSION:  1.  Functional deficits due to left greater trochanteric fracture status      post fall.  2.  History of polio with left lower extremity monoplegia.  3.  Depression on Wellbutrin and Trazodone.   PLAN:  1.  Admit for SACU level rehab, PT, OT.  2.  Estimated stay is 7-10 days.   The patient is good rehab candidate.       AEK/MEDQ  D:  11/17/2004  T:  11/17/2004  Job:  [id number]   cc:   Merlene Laughter. Renae Gloss, M.D.  [address]  [address]  [place]  [place] [address]  Fax: [phone]   Ollen Gross, M.D.  [facility]  [address]  [address]  [place]  [place] [address]  Fax: [phone]

## 2010-10-30 NOTE — Discharge Summary (Signed)
[place]  Patient:    [name], [name] Visit Number: [id number] MRN: [id number]          Service Type: Attending:  Ollen Gross, M.D. Dictated by:   Marcie Bal Troncale, P.A.C. Adm. Date:  07/05/01 Disc. Date: 07/08/01                             Discharge Summary  ADMITTING DIAGNOSES: 1. Recurrent right total hip dislocations. 2. Status post right total hip. 3. Polio, left lower extremity, with weakness. 4. Gastroesophageal reflux disease. 5. Anxiety.  DISCHARGE DIAGNOSES: 1. Recurrent dislocation, right hip, status post right acetabular revision    to extra strength cup. 2. Status post right total hip. 3. Polio, left lower extremity, with weakness. 4. Gastroesophageal reflux disease. 5. Anxiety. 6. Mild postoperative blood loss anemia.  PROCEDURE:  He was taken to the OR on July 05, 2001 and underwent a right acetabular revision with conversion to a constrained system; surgeon Dr. Trudee Grip.  Surgery done under general anesthesia.  CONSULTS:  Rehabilitation services, Dr. Riley Kill.  BRIEF HISTORY:  Patient is a 55 year old female with recurrent right total arthroplasty dislocation.  She has undergone a primary right total hip in 1991, underwent revision surgery in October of 2000.  She did fairly well up until April of 2002 when she dislocated her hip,  She had a second dislocation in October 2002.  She has had multiple dislocations and it was felt she would benefit undergoing conversion to a constrained cup.  Risks and benefits were discussed, and she has elected to proceed with surgery.  LABORATORY DATA:  CBC on admission:  hemoglobin of 15.3, hematocrit of 44.6, white cell count 9.0, red cell count 4.95, and differential neutrophils 58, lymphs 31, monos 5, eos 6, and basos 1.  Postop H&H 11.6 and 33.3.  Last known H&H prior to transfer was 11.3 and 31.6.  PT and PTT on admission were 13.8 and 31 respectively with INR of  1.1.  Serial pro times were followed, the last noted PT and INR prior to transfer were 14.9 and 1.2.  Chem panel on admission; all within normal limits.  Patient had a slight drop in sodium from 138 to 133, slight increase in glucose from 93 to 132, drop in calcium from 9.7 to 8.0.  Urinalysis on admission negative.  Blood group type O positive.  Chest x-ray dated July 05, 2001; no active disease.  Hip films and pelvic films taken on July 05, 2001; status post revision right acetabular component.  There are no adverse features.  EKG dated July 05, 2001; normal sinus rhythm; normal EKG.  No specific changes from last tracing of March 23, 2001 confirmed by Dr. Olga Millers.  HOSPITAL COURSE:  The patient was admitted to [facility], taken to the OR and underwent the above-stated procedure without complications. Patient tolerated the procedure well; was later transferred to the recovery room and then to the orthopedic floor for continued postop care.  Hemovac drain placed at the time of surgery was pulled on postop day one.  Patient was placed on PCA analgesics for pain control following surgery.  Also given appropriate postoperative antibiotics for 24 hours.  Started on total hip protocol, touchdown weightbearing.  PT/OT were consulted to assess for gait training ambulation.  During the hospital course her labs were followed.  Hemovac drain was pulled on postop day one.  Dressing change was initiated on  postop day two.  She did develop some cellulitis in and about her right index finger from a bite.  She was placed on Keflex for this.  Patient was slow to progress in physical therapy, therefore a rehab consult was called.  Patient was seen in consultation by Dr. Riley Kill who felt she would be appropriate for inpatient rehab.  It was decided she would be transferred at which time a bed became available.  On postop day three patient had been weaned off her PCA  analgesics over to p.o. meds.  She was doing quite well.  Her right index finger was better.  The hip was doing well.  It was noted a bed became available in the rehab unit and it was decided the patient would be transferred at that time.  DISCHARGE PLAN:  Discharged to [facility].  DISCHARGE DIAGNOSES:   Same as above.  DISCHARGE MEDICATIONS:  1. Colace 100 mg p.o. b.i.d.  2. Trinsicon 1 p.o. t.i.d.  3. Percocet 1 or 2 every 4-6 hours as needed for pain.  4. Tylenol 1 or 2 every 4-6 hours as needed for mild pain or temp greater     than 101.  5. Robaxin 500 mg p.o. q6. hours p.r.n. spasm.  6. Restoril 15-30 mg p.o. q.h.s. p.r.n. sleep or trazodone 50 mg p.o. q.h.s.     for sleep.  7. Alprazolam 1 mg p.o. q.d.  8. Prevacid 30 mg p.o. q.d.  9. Coumadin as per pharmacy protocol. 10. Keflex 500 p.o. q.i.d. times seven days initiated on July 07, 2001.  DIET:  Diet as tolerated.  ACTIVITIES:  She is touchdown weightbearing, gait training ambulation as per PT and OT on rehab.  Total hip precautions at all times.  Dressing change daily on rehab.  DISPOSITION:  [facility].  FOLLOW-UP:  Dr. Despina Hick in two weeks from surgery or call me following the discharge from the rehab unit.  CONDITION ON DISCHARGE:  Improved. Dictated by:   Marcie Bal Troncale, P.A.C. Attending:  Ollen Gross, M.D. DD:  10/16/01 TD:  10/18/01 Job: [id number]

## 2010-10-30 NOTE — Op Note (Signed)
[place]  Patient:    [name], [name]                      MRN: [id number] Proc. Date: 10/05/00 Attending:  Georges Lynch. Darrelyn Hillock, M.D. CC:         Ollen Gross, M.D.   Operative Report  PREOPERATIVE DIAGNOSIS:  Posterior dislocation of a right revised total hip. This lady had polio.  She had a total hip done initially in 1990, and Dr. Lequita Halt revised this two years ago.  She has polio.  She was leaning over in her wheelchair and dislocated her hip.  Just prior to that, she has been feeling her hip snapping "sort of in and out."  PROCEDURE:  Closed manipulation under general anesthesia.  I reduced the hip. X-ray was taken.  SURGEON:  Georges Lynch. Darrelyn Hillock, M.D.  ASSISTANT:  Della Goo, P.A.  DESCRIPTION OF PROCEDURE:  Under general anesthesia, closed manipulation of the right hip dislocation was carried out.  I simply flexed her hip, abducted her hip, and exerted some side pressure, and reduced the hip with ease.  X-ray was taken and showed anatomical position.  She was then placed in a knee immobilizer and sent to the floor. DD:  10/05/00 TD:  10/06/00 Job: [id number]

## 2010-10-30 NOTE — Op Note (Signed)
[name], [name]             ACCOUNT NO.:  [id number]   MEDICAL RECORD NO.:  [id number]          PATIENT TYPE:  INP   LOCATION:  1612                         FACILITY:  [facility]   PHYSICIAN:  Thornton Park. Daphine Deutscher, MD  DATE OF BIRTH:  [birth year]   DATE OF PROCEDURE:  10/28/2006  DATE OF DISCHARGE:                               OPERATIVE REPORT   PREOPERATIVE DIAGNOSIS:  Hernia in open appendectomy incision --  traumatic.   POSTOPERATIVE DIAGNOSIS:  Hernia in open appendectomy incision --  traumatic, incarcerated cecum.   PROCEDURE:  Laparoscopically assisted takedown of incarcerated right  lower quadrant hernia with open closure using Marlex-type polypropylene  mesh to approximate the fascia.   SURGEON:  Thornton Park. Daphine Deutscher, MD   ASSISTANT:  Ardeth Sportsman, MD   ANESTHESIA:  General.   DESCRIPTION OF PROCEDURE:  [name] was given general anesthesia  and prepped with Techni-Care and draped sterilely.  Access was gained  through the left upper quadrant with a OptiVu 10 mm and abdomen was  insufflated.  I found a lot of omentum stuck up into the hernia along  with the sliding loop of the cecum up in the hernia as well.  I went  ahead and mobilized that and transected it with a harmonic scalpel.  I  then opened it along the old incision and dissected free the sac and  then removed most of sac and then found the component of the cecum,  which was on the lateral aspect, and took that down with gentle sharp  dissection.  Once it was freed up, it fell away.  I then repaired this  primarily using a piece of Marlex mesh, placing sutures in a simple  horizontal fashion through and through the mesh, fascia, fascia, mesh  and tying it down.  When I had completed that I had completely  obliterated the space.  It looked good from the inside and I placed a  couple of extra sutures to made to secure it adequately.  The wound  after irrigating was closed with staples.  The patient was  awakened and  taken to the recovery room in satisfactory condition.      Thornton Park Daphine Deutscher, MD  Electronically Signed     MBM/MEDQ  D:  10/28/2006  T:  10/29/2006  Job:  [id number]

## 2010-10-30 NOTE — Assessment & Plan Note (Signed)
HISTORY:  [name] is back regarding her neck and arm pain and post-polio  syndrome.  She needs a new power wheelchair.  Her current one is falling  apart.  She has had it repaired on multiple occasions.  She is worrying  about the gear box malfunctioning permanently.  She does complain of some  recurrent bilateral hand and wrist pain and weakness.  She has been more  active I think as of late around the home.  She states that the pain is not  related to her neck directly.  It seems to bother her more in the evening  hours and particularly after certain activities.  She controls her power  wheelchair with the back of her wrist actually, rather than using the hand  to control it, because her pain was constant and aching.  She is made worse  with general activities of the hands.   REVIEW OF SYSTEMS:  The patient reports tingling in the hands.  No other  changes are noted in her review of system items.  A full review of systems  is in the health and history portion of the chart.   SOCIAL HISTORY:  The patient has no significant change today.   PHYSICAL EXAMINATION:  VITAL SIGNS:  Blood pressure 98/60, pulse 82,  respirations 18, saturation 93% on room air.  GENERAL:  The patient is obese, in no apparent distress.  She is alert and  oriented x3.  Affect is bright and appropriate.  NEUROLOGIC:  Strength is stable in both upper extremities.  She is 4+/5 on  the right and 4 to 4+/5 on the left.  The neck areas are mildly tender.  She  had positive Tinel's sign on both wrists today.  Sensory exam appeared  grossly intact.  Reflexes were 1+ to 1++ bilaterally.  Legs were stable from  a motor standpoint.  The patient has a full passive range of motion today.  Cognitively she is appropriate.   ASSESSMENT:  1.  Post-polio syndrome.  2.  Left leg length discrepancy.  3.  Cervical degenerative disk disease and degenerative joint disease with      radiculopathic symptoms.  4.  Myofascial pain,  secondary to number three.   PLAN:  1.  Will continue home exercise program.  2.  Recommend splinting of the wrist when able for rest.  She may have some      mild carpal tunnel syndrome symptoms.  3.  I completed the paper work regarding her powered wheelchair today.  4.  Continue Vicodin for break-through pain.  5.  I will see the patient back in September, as part of her routine      followup.      ZTS/MedQ  D:  11/02/2004 15:31:27  T:  11/02/2004 16:13:39  Job #:  [id number]

## 2010-10-30 NOTE — H&P (Signed)
NAME:  [name], [name]                       ACCOUNT NO.:  [id number]   MEDICAL RECORD NO.:  [id number]                   PATIENT TYPE:  EMS   LOCATION:  MINO                                 FACILITY:  [facility]   PHYSICIAN:  Robert A. Thomasena Edis, M.D.             DATE OF BIRTH:  10/19/1955   DATE OF ADMISSION:  01/27/2002  DATE OF DISCHARGE:                                HISTORY & PHYSICAL   CHIEF COMPLAINT:  Right hip pain.   HISTORY OF PRESENT ILLNESS:  The patient is a 55 year-old female with an  extensive past medical history in regard to her right hip who presents with  complaints of right hip pain after a fall today.  She has previously  undergone right total hip arthroplasty as well as revision surgeries x3 and  a subsequent periprosthetic femur fracture treated with cables back in 2000.  All surgeries were performed by Dr. Sherlean Foot.  Today the patient was at a  gathering when she says her leg just went out from beneath her and she fell  to the ground.  She immediately had pain in that right hip.  She presented  to the emergency department at [facility].  X-rays were obtained  and Dr. Thomasena Edis in Orthopedics was consulted. The patient's x-rays were read  by the radiologist as negative, but the patient had intractable groin and  hip pain that was unrelieved with IV pain medications.  The patient reports  groin pain primarily.  Any attempts at movement she says causes her pain.  The pain radiates to her thigh and her back.  She denies any numbness or  tingling. She denies any real back pain.   REVIEW OF SYMPTOMS:  As per history of present illness.  GENERAL:  She  denies any fever or chills.  HEENT:  No diplopia, blurred vision or  headaches.  She does report some rhinorrhea. No sore throat or earache.  CHEST:  No chest pain, shortness of breath or cough.  ABDOMEN:  No abdominal  pain, nausea, vomiting, diarrhea or constipation. GU:  No urinary frequency,  hematuria oro  dysuria.  GI:  No melena or bright red blood per rectum.  EXTREMITIES:  No numbness or tingling in her extremities.   PAST MEDICAL HISTORY:  1. Polio in her left leg.  2. She denies all other medical complaints.   PAST SURGICAL HISTORY:  As per history of present illness.   FAMILY HISTORY:  Noncontributory.   ALLERGIES:  SULFA.   CURRENT MEDICATIONS:  1. Vicodin p.r.n..  2. Trazodone 150 mg q. h.s.  3. Xanax 1 mg q. a.m.   SOCIAL HISTORY:  She lives alone in [place]. She is not currently  working.  She denies tobacco or alcohol use.   PHYSICAL EXAMINATION:  VITAL SIGNS:  See our records.  GENERAL:  Well-developed, well-nourished female in mild distress from her  hip pain.  HEENT:  Head  is normocephalic, atraumatic.  Pupils equal, round and reactive  to light.  Extraocular movements are grossly intact.  Oropharynx is clear  without redness, exudate or lesions.  NECK:  Supple with no cervical lymphadenopathy.  CHEST:  Clear to auscultation bilaterally.  No wheezes or crackles.  HEART:  Regular rate and rhythm with no murmurs, rubs or gallops.  ABDOMEN:  Soft, nontender and nondistended with active bowel sounds and no  masses.  BREASTS AND GENITO-URINARY:  Not examined, not pertinent to present illness.  SKIN:  Intact without rashes, lesions or abrasions.  PULSES:  Dorsalis pedis pulse +2 in the affected leg.  NEUROLOGICAL:  Motor and sensory are grossly intact in the affected leg.  EXTREMITIES:  She has no clinical deformity. She is tender over the soft  tissues of the right thigh and right hip.  She has mild discomfort in the  right hip with internal and external rotation maneuvers of the hip.  She  reports pain in the groin with any attempts at hip flexion beyond 20  degrees.  No tenderness to palpation along the midline of the lumbar spine.   RADIOGRAPHIC STUDIES:  X-rays of the right hip, pelvis, femur and knee are  all reviewed with Dr. Thomasena Edis and read as negative.   There is no evidence of  fracture, dislocation or hardware failure.  The radiologist concurs on his  reading.   ASSESSMENT:  1. Right hip pain after a fall without evidence of fracture or dislocation.  2. History of polio, left leg.   PLAN:  The patient will be admitted for pain control.  We will obtain PT and  OT consults for progressive ambulation and weightbearing as tolerated. We  will see how she does over the next day or two.  We have discussed this at  length with the patient and she is accompanied by her brother and sister-in-  law.  They agree that she probably does need admission for pain control.     Marcie Bal Troncale, P.A.-C.              Robert A. Thomasena Edis, M.D.    ADT/MEDQ  D:  01/27/2002  T:  01/30/2002  Job:  [id number]

## 2010-10-30 NOTE — Assessment & Plan Note (Signed)
MEDICAL RECORD NUMBER:  [id number].   [name] is back regarding her neck and arm pain. She tells me that the  cervical traction has been helpful, at least when she is using it. Does not  have any long lasting effects more than an hour or two afterwards. She has  got up to the 10-pound level and feels that this is helpful. She was unable  to tolerate the Neurontin. Celebrex caused some GI upset and did not  ultimately help her. Right elbow has improved with ice. She has not used ice  or heat regularly on the neck. She has tried to maintain a certain extent a  range of motion in the neck with home exercises. She is not always regular  with these. Vicodin helps the patient's pain during breakthrough periods.  She uses a half to one pill at a time. She generally uses one pill every day  or so.   SOCIAL HISTORY:  Unchanged. She continues to live independently with her  power wheelchair.   REVIEW OF SYSTEMS:  The patient reports some reflux. She has lost 8 pounds  since I last saw her. She reports occasional numbness in the neck and arm,  associated weakness. She rates her pain at a 4-5/10.   PHYSICAL EXAMINATION:  Blood pressure is 112/69, pulse 75, respiratory rate  16. She is saturating 95% on room air. The patient is in good spirits. Moves  arms in the 4 to 4+/5 range. She is a bit weaker in the left arm today. She  had a taut band of muscle in the left mid trapezius/supraspinatus muscle  today. She had less pain over the right lateral epicondyle. Neck area and  shoulder region was painful with twisting to the right side today. Sensory  exam was grossly intact in the upper extremities. No focal reflex  differences were noted although she was about 1+ bilaterally. The patient  seems to have lost some weight since last visit.   ASSESSMENT:  1.  Post polio syndrome.  2.  Left length discrepancy on the left.  3.  Cervical degenerative disk disease and degenerative joint disease with  radiculopathic symptoms.  4.  Myofascial pain secondary to #3.   PLAN:  1.  Continue with home traction.  2.  Recommend ice and heat to the left trapezius/supraspinatus area.  3.  I gave the patient a trigger point injection to the left supraspinatus      muscle today without any adverse effects. She has had good results with      these in the past.  4.  Continue Vicodin for breakthrough pain.  5.  Continue range of motion program.  6.  See the patient back in about six months' time.      ZTS/MedQ  D:  08/25/2004 15:05:41  T:  08/25/2004 15:44:09  Job #:  [id number]

## 2010-10-30 NOTE — Assessment & Plan Note (Signed)
HISTORY:  The patient is back for her post-polio syndrome.  She has been  doing well from the standpoint of her ribs and back pain.  She did have some  neck discomfort.  She saw an orthopedic surgeon, who found some spurs and  arthritis on her MRI of the neck.  She went to physical therapy, which has  been helpful.  She has also been doing some traction.  She is still having  some pain in the left shoulder region,.  She rates it as a 2/10.  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   REVIEW OF SYSTEMS:  The patient denies any chest pain, heart murmurs, or  heart troubles.  She has some occasional swelling in the legs still.  Weakness and numbness ongoing in the lower extremities.  No dizziness,  spasm, or confusion.  She occasionally has depression and poor sleep, and  decreased endurance.  She denies headaches, nausea, vomiting, reflux,  constipation, or bowel or bladder incontinence.  She has had no fever or  chills.  No recent weight loss or gain.   PHYSICAL EXAMINATION:  VITAL SIGNS:  Blood pressure 128/82, pulse 71,  respirations 16.  She is saturating 96% on room air.  MUSCULOSKELETAL/NEUROLOGIC:  The patient has an intact lower extremity  examination today without significant change.  On examination of her neck  she had some loss of cervical flexion to the right and to a lesser extent  the left.  Forward flexion was also mildly inhibited today.  Extension was  less of a problem today.  There was a painful knot in the mid-trapezius on  the left side today, which reproduced her pain.  She had some generalized  tenderness elsewhere in the cervical paraspinals, etc, but did not have  severe pain with palpation.   PLAN:  1. Post-polio syndrome.  2. History of leg length discrepancy on the left.  3. Recent neck pain, which appears to be myofascial in nature, with a    degenerative joint component as well.   PLAN:  1. The patient will continue with her home exercise program and traction.  2. I offered to perform a trigger-point injection today, but the patient     declined, secondary to monitory reasons.  3. We did give her samples of _________ today to apply to the area.  4. I discussed some counter strain techniques that the patient can perform     on herself, to help assist with these myofascial areas of discomfort.  5. I will see the patient back in approximately one year's time, and sooner     if needed.      Ranelle Oyster, M.D.   ZTS/MedQ  D:  08/19/2003 16:23:38  T:  08/19/2003 17:33:30  Job #:  [id number]

## 2010-10-30 NOTE — Assessment & Plan Note (Signed)
DATE OF VISIT:  July 27, 2004.   MEDICAL RECORD NUMBER:  [id number].   DATE OF BIRTH:  1955-11-23.   [name] is back with increased neck and arm pain.  She had an MRI of the  cervical spine, which had a chance to review today and revealed multilevel  spondylosis.  The patient had significant degenerative disk disease and  spondylosis at C4-5, C6-5 and C6-7.  C5-6 appeared to be most effected by my  recollection.  The patient has had some numbness and weakness along the  right arm and pain in the left neck area really increasing since November.  Dr. Newell Coral saw the patient in consult, who felt that she may need surgery  at some point.  The patient is not anxious to pursue surgery at this time,  although she feels that she may ultimately need it.  Her pain is ranging on  average from 5-10.  It is made worse with movements of the head.  It is also  exacerbated by repetitive use of her upper extremities.  She has had some  relief in the past with trigger point injections, but these are not  longstanding.  I last saw approximately in April of last year.   SOCIAL HISTORY:  Unchanged.  The patient is single and lives alone with some  assistance of her mother.   REVIEW OF SYSTEMS:  The patient reports cold and flu symptoms, weakness,  numbness, problems with sleep and headaches.  Other pertinent positives are  listed above.  The full review of systems section is in the health and  history portion of the chart.   PHYSICAL EXAMINATION:  The blood pressure is 105/54, pulse 76 and  respiratory rate 20.  She is saturating 98% on room air.  The patient is in  a wheelchair and did not ambulate for me today.  Affect was bright and  appropriate.  Appearance is well kept.  On examination of the neck, Spurling  signs were positive bilaterally.  She did have pain and a taut band in the  left mid trapezius area.  There was not an area evident on the right side.  The patient had more weakness in  the left arm today at 4/5.  She was 4+ to  5/5 on the right side.  The patient did have pain over the right lateral  epicondyle and extensor tendons, as well as over the lateral triceps tendon  distally towards the elbow.  Impingement maneuvers and bicipital tests were  equivocal to negative.  The patient had 1+ reflexes bilaterally.  No  discrete sensory loss on the right or left side.  The patient remains  moderately obese, but seems to have lost weight since her last visit.  The  extremities showed no cyanosis or clubbing and 1+ edema.   ASSESSMENT:  1.  Post polio syndrome.  2.  History of leg length discrepancy on the left.  3.  Cervical degenerative disk disease/degenerative joint disease with some      radiculopathic symptoms.  4.  Myofascial pain secondary to cervical degenerative disk      disease/degenerative joint disease.   PLAN:  1.  The patient is to continue with her general exercises at home.  I would      like to see move her traction up a bit to the 10-15 pound range.  She      was advised to go up very slowly to tolerance (ie in 2lb increments).  2.  Recommend ice two to three times a day to the right elbow, along the      extensor tendon, as well as to the mid to lower third of the arm.  Will      see if this has a benefit for any tendonitis symptoms that she may be      experiencing near the elbow.  3.  Recommend a Celebrex trial 100 mg daily for one to two weeks.  4.  We had a lengthy discussion regarding the pros and cons of surgery.  The      patient may ultimately need some type of fixation surgery to the neck,      but I believe as long as she can hold off on this, the better off she      will be.  5.  For neuropathic pain, I added Neurontin low dose at 100 mg q.h.s. for      three days, titrating up to t.i.d. over six days' time.  6.  I refilled the Vicodin 5/500 mg one q.12h. p.r.n. for breakthrough pain,      #60.  7.  I will see the patient back in one  month's time.      ZTS/MedQ  D:  07/27/2004 13:02:03  T:  07/27/2004 14:13:35  Job #:  [id number]   cc:   Marlowe Kays, M.D.  [address]  [place]  [place] [address]  Fax: [phone]

## 2010-10-30 NOTE — Op Note (Signed)
[place]  Patient:    Debra, Gonzales Visit Number: [id number] MRN: [id number]          Service Type: MED Location: [id number] Attending Physician:  Verlee Rossetti Proc. Date: 03/23/01 Admit Date:  03/23/2001                             Operative Report  PREOPERATIVE DIAGNOSIS:  Dislocated right total hip.  POSTOPERATIVE DIAGNOSIS:  Dislocated right total hip.  PROCEDURE:  Closed reduction right total hip.  SURGEON:  Trudee Grip, M.D.  ASSISTANT:  None.  ANESTHESIA:  General anesthesia.  COMPLICATIONS:  None.  INDICATIONS:  [name] is a 55 year old female who underwent a right total hip revision approximately two years ago and has done extremely well.  She unfortunately has postpolio syndrome in her right leg and has very poor muscle control.  She sustained a dislocation approximately six months ago treated with closed reduction.  Today she got into an unusual position and again dislocated.  She presents now for closed reduction.  DESCRIPTION OF PROCEDURE:  After successful administration of general anesthesia, the patient was placed on the operating table and reduction performed.  This was a very difficult reduction.  We did it under fluoroscopic guidance.  She was posterior and superior and with gentle traction superiorly in a distal direction, reduction is performed.  This is confirmed to be reduced by fluoroscopy in two planes.  I was able to flex her 95, rotate in 30, out 40, and abduct 40 after reducing her.  She was subsequently awakened and transported to the recovery room in stable condition. Attending Physician:  Malon Kindle R. DD:  03/23/01 TD:  03/24/01 Job: [id number]

## 2010-10-30 NOTE — Discharge Summary (Signed)
NAMECARLEI, HUANG             ACCOUNT NO.:  [id number]   MEDICAL RECORD NO.:  [id number]          PATIENT TYPE:  ORB   LOCATION:  4531                         FACILITY:  [facility]   PHYSICIAN:  Ranelle Oyster, M.D.DATE OF BIRTH:  05-14-1956   DATE OF ADMISSION:  11/17/2004  DATE OF DISCHARGE:  11/28/2004                                 DISCHARGE SUMMARY   DISCHARGE DIAGNOSES:  1.  Left greater trochanteric fracture after fall with conservative care and      K-AFO brace.  2.  Pain management.  3.  Poliomyelitis with left lower extremity paralysis.  4.  Depression.   HISTORY OF PRESENT ILLNESS:  A 54 year old white female with history of  polio, left lower extremity paralysis, also three right hip surgeries in the  past, of which she received inpatient rehab services for, now admitted Nov 11, 2004, after a fall while getting out of the bath.  Sustained a  nondisplaced left trochanteric fracture.  Orthopedic service, Dr. Ollen Gross, conservative care, weightbearing as tolerated, fitted with a hinged  knee brace.  She was admitted to subacute care service.   PAST MEDICAL HISTORY:  See discharge diagnoses.   ALLERGIES:  SULFA.   SOCIAL HISTORY:  Lives alone in [place].  One-level home with a ramp.  Wheelchair-bound prior to admission except for transfers.  Local family to  check as needed.   MEDICATIONS PRIOR TO ADMISSION:  Wellbutrin, trazodone, Prevacid and  Vicodin.   HOSPITAL COURSE:  Patient with progressive gains while on rehab services  with therapies initiated daily.  The following issues were followed during  the patient's rehab course.   Pertaining to Ms. Mach's left greater trochanteric fracture after a  fall, conservative care.  Neurovascular sensation intact.  Her hinged knee  brace was changed to a K-AFO for overall comfort.  She was essentially  wheelchair-bound from a history of polio with left lower extremity  paralysis.  She was supervision  for bed mobility and transfers, ambulating  20 feet minimal contact guard.  Home health therapies have been arranged.  Her blood pressure has remained well-controlled without the use of  antihypertensive medication.  She remained on her Vicodin as needed for  pain.   Latest labs showed a sodium 140, potassium 3.7, BUN 8, creatinine 0.6.  Hemoglobin 12.3, hematocrit 36.4.   DISCHARGE MEDICATIONS:  1.  Wellbutrin 150 mg daily.  2.  Trazodone 100 mg at bedtime.  3.  Prempro p.o. daily.  4.  Prevacid as needed.  5.  Vicodin as needed, pain.   ACTIVITY:  K-AFO brace in place, left lower extremity.  Home health physical  and occupational therapy.   Follow-up with Dr. Ollen Gross as advised, Dr. Riley Kill, pain management.       DA/MEDQ  D:  11/27/2004  T:  11/28/2004  Job:  [id number]   cc:   Merlene Laughter. Renae Gloss, M.D.  [address]  [address]  [place]  [place] [address]  Fax: [phone]   Ollen Gross, M.D.  [facility]  [address]  [address]  [place]  [place]  [address]  Fax: [phone]

## 2010-10-30 NOTE — Discharge Summary (Signed)
[place]. [facility]  Patient:    KEANNA, TUGWELL Visit Number: [id number] MRN: [id number]          Service Type: [facility] Location: [id number] Attending Physician:  Faith Rogue T Dictated by:   Mcarthur Rossetti. Angiulli, P.A. Admit Date:  07/08/2001 Disc. Date: 07/14/01   CC:         Ollen Gross, M.D.  Merlene Laughter. Renae Gloss, M.D.   Discharge Summary  DISCHARGE DIAGNOSES: 1. 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 2. Anemia. 3. History of polio with left lower extremity weakness. 4. Gastroesophageal reflux disease. 5. Anxiety. 6. Left ankle fusion x3.  HISTORY OF PRESENT ILLNESS:  A 55 year old white female with history of polio and left lower extremity weakness, right total hip arthroplasty in 1991 with revision in 2000.  Recurrent dislocation x2, she had received inpatient rehab services for in the past. She was now admitted July 05, 2001, for right hip acetabular revision per Ollen Gross, M.D.  She was placed on Coumadin for deep venous thrombosis prophylaxis and weightbearing as tolerated.  Hospital course uneventful. She did have some mild erythema of the right index MCP joint of which she received Keflex for.  She was max assist for bed mobility and transfers.  Latest chemistries were unremarkable.  She was admitted for comprehensive rehab program.  PAST MEDICAL HISTORY:  See discharge diagnoses.  PAST SURGICAL HISTORY:  Right total hip arthroplasty in 1991 with revision and recurrent dislocation. She had a left ankle fusion x3.  ALLERGIES:  SULFA.  MEDICATIONS PRIOR TO ADMISSION: 1. Zyrtec daily. 2. Alprazolam 1 mg daily. 3. Trazodone 50 mg at bedtime. 4. Vioxx daily. 5. Prevacid 30 mg daily.  PRIMARY M.D.:  Merlene Laughter. Renae Gloss, M.D.  SOCIAL HISTORY:  No alcohol or tobacco.  She lives alone in [place], [place].  Independent via power wheelchair x2 years.  Also handicap Zenaida Niece for driving. She lives in one level  home with a ramp. She used a crutch for transfers and sliding board transfers.  Mother can assist on discharge.  She does use a shoe lift for left lower extremity for history of polio.  She does not work outside the home.  HOSPITAL COURSE:  The patient did well while in rehabilitation services with therapies initiated on a b.i.d. basis.  The following issues were followed during the patients rehab course.  Pertaining to Ms. McIntyres right hip acetabular revision, remained stable.  Surgical site healing nicely.  No signs of infection. She did have staples intact.  She would follow up with Dr. Ollen Gross, orthopedic services, on her discharge.  Arrangements have been made for follow-up in his office on Tuesday, July 18, 2001, for removal of clips.  She remained on Coumadin for deep venous thrombosis prophylaxis. Latest INR of 2.9. [facility] would follow up for Coumadin protocol.  Postoperative anemia with latest hemoglobin of 10.6, hematocrit 30.3. History of polio with left lower extremity weakness. She did use a shoe lift.  She continued on her Prevacid for gastroesophageal reflux disease. Anxiety remained controlled throughout her course on Xanax.  Pain management was ongoing with some minor adjustments due to some nausea. She was changed from Tylox to Vicodin with good results.  Her iron had been discontinued as this was suspect causing her nausea.  Overall for her functional mobility, she was now ambulating minimal assistance for her ambulation. Endurance had greatly improved. She was encouraged with her overall progress. She was minimal assist for  transfers.  Family would provide supervision and follow-up. Home health therapies had been arranged.  Latest labs showed INR 2.9.  Hemoglobin 10.6, hematocrit 30.3.  Sodium 138, potassium 3.7, BUN 7, creatinine 0.5.  DISCHARGE MEDICATIONS AT TIME OF DICTATION: 1. Coumadin 6 mg daily. 2. Vicodin one to two tablets  every four to six hours as needed for pain. 3. Desyrel 50 mg at bedtime. 4. Xanax 1 mg daily. 5. Claritin 10 mg daily. 6. Prevacid 30 mg daily. 7. Tylenol as needed.  ACTIVITY: Weightbearing as tolerated.  DIET:  Regular.  WOUND CARE:  Cleanse incision daily with warm soap and water.  FOLLOW-UP:  She will follow up with Dr. Lequita Halt July 18, 2001.  Home health physical and occupational therapy.  Home health nurse via [facility] for Coumadin protocol. Dictated by:   Mcarthur Rossetti. Angiulli, P.A. Attending Physician:  Faith Rogue T DD:  07/13/01 TD:  07/13/01 Job: [id number]

## 2010-10-30 NOTE — H&P (Signed)
[place]. [facility]  Patient:    [name], [name] Visit Number: [id number] MRN: [id number]          Service Type: Attending:  Ollen Gross, M.D. Dictated by:   Marcie Bal Troncale, P.A.C. Adm. Date:  07/05/01   CC:         Merlene Laughter. Renae Gloss, M.D.                         History and Physical  DATE OF BIRTH:  2055-10-01  CHIEF COMPLAINT:  Recurrent right total hip arthroplasty dislocation.  HISTORY OF PRESENT ILLNESS:  [name] is a 55 year old female who has recurrent right total hip arthroplasty dislocations.  She had undergone a primary right total hip in 1991.  She underwent revision surgery in October of 2000.  She had done fairly well until April of 2002, at which time she dislocated that right hip.  She had another dislocation in October of 2002. Because of the multiple dislocations, she has elected to undergo surgical intervention.  REVIEW OF SYSTEMS:  She denies any recent fever or chills.  She reports an occasional headache.  No diplopia or blurred vision.  She reports some rhinorrhea.  No sore throat or earache.  No chest pain, shortness of breath, or cough.  No abdominal pain, nausea, vomiting, diarrhea, or constipation.  No melena or bright red blood per rectum.  No urinary frequency, hematuria, or dysuria.  She does report bilateral upper extremity numbness in the mornings which she believes is secondary to her polio.  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  Positive history for gastroesophageal reflux disease, anxiety disorder, and after anesthesia, she gets "very sick."  Denies any history of strokes, seizures, cancer, diabetes, hypertension, and heart, lung, or kidney disease.  PAST SURGICAL HISTORY:  As per the HPI, as well as multiple surgeries for muscle transfers in her left lower extremity and left ankle fusion x 2.  FAMILY HISTORY:  Significant for CHF  in her father and diabetes in her brother.  ALLERGIES:  Allergy to SULFA causing her to be "sick."  CURRENT MEDICATIONS: 1. Trazodone 50 mg q.h.s. 2. Alprazolam 1 mg q.d. 3. Prevacid 30 mg q.d. 4. Vioxx 25 mg q.d., which she will stop five days before surgery. 5. Zyrtec.  SOCIAL HISTORY:  Merlene Laughter. Renae Gloss, M.D., is her primary medical physician. The patient lives alone.  She does have ramps leading into her one-level home. She denies tobacco or alcohol use.  She is not currently working.  PHYSICAL EXAMINATION:  She is afebrile.  The pulse is 76, respiratory rate 20 and unlabored, and blood pressure 128/100.  GENERAL APPEARANCE:  This is a well-developed, well-nourished female in no acute distress.  HEENT:  The head is normocephalic and atraumatic.  The pupils equal, round, and reactive to light.  Extraocular movements grossly intact.  The oropharynx is clear without redness, exudate, or lesions.  NECK:  Supple with no cervical lymphadenopathy.  CHEST:  Clear to auscultation bilaterally with no wheezes or crackles.  HEART:  Regular rate and rhythm.  No murmurs, rubs, or gallops.  ABDOMEN:  Soft, nontender, and nondistended with active bowel sounds.  BREASTS:  Not examined as not pertinent to present illness.  GENITOURINARY:  Not examined as not pertinent to present illness.  EXTREMITIES:  She has pain-free range of motion of the hip.  Motor and sensory are grossly intact in  the effected extremity.  The DP and PT pulses are 2+.  X-RAYS:  X-rays demonstrate a prosthesis in good position.  IMPRESSION: 1. Recurrent right total hip dislocations. 2. Polio with left lower extremity weakness. 3. Gastroesophageal reflux disease. 4. Anxiety.  PLAN:  The patient will be admitted to [facility] to undergo a right hip acetabular revision by Ollen Gross, M.D., on July 05, 2001.  We have received a letter from [place] [facility]. Renae Gloss, M.D., feeling that the  patient was medically cleared to undergo her surgery and felt there were no cardiovascular disorders that would pose any perioperative risks.  All questions have been encouraged and answered.  Preoperative labs and signed surgical consents will be obtained. Dictated by:   Marcie Bal Troncale, P.A.C. Attending:  Ollen Gross, M.D. DD:  06/28/01 TD:  06/28/01 Job: [id number]

## 2011-03-04 LAB — CBC
Hemoglobin: 13.1
MCHC: 34.7
MCV: 90.3
Platelets: 372
Platelets: 327
RBC: 4.78
RDW: 13.4
WBC: 9.9

## 2011-03-04 LAB — URINALYSIS, ROUTINE W REFLEX MICROSCOPIC
Hgb urine dipstick: NEGATIVE
Protein, ur: NEGATIVE
Urobilinogen, UA: 0.2

## 2011-03-04 LAB — COMPREHENSIVE METABOLIC PANEL
ALT: 18
AST: 21
Albumin: 3.6
CO2: 25
Chloride: 106
GFR calc Af Amer: >60
GFR calc non Af Amer: >60
Potassium: 3.6
Sodium: 140
Total Bilirubin: 1

## 2011-03-04 LAB — BASIC METABOLIC PANEL
BUN: 11
CO2: 28
Calcium: 9.4
Creatinine, Ser: 0.55
GFR calc Af Amer: >60

## 2011-03-04 LAB — URINE CULTURE
Colony Count: >=100000
Special Requests: NEGATIVE

## 2011-03-04 LAB — DIFFERENTIAL
Eosinophils Absolute: 0.4
Lymphocytes Relative: 25
Lymphs Abs: 2.5
Monocytes Relative: 6
Neutro Abs: 6.4
Neutrophils Relative %: 65

## 2011-03-04 LAB — LIPASE, BLOOD: Lipase: 36

## 2011-03-05 LAB — COMPREHENSIVE METABOLIC PANEL
Albumin: 3.4 — ABNORMAL LOW
Alkaline Phosphatase: 91
BUN: 11
Chloride: 104
Creatinine, Ser: 0.81
Glucose, Bld: 99
Potassium: 3.9
Total Bilirubin: 0.3

## 2011-03-05 LAB — CBC
HCT: 38.7
Hemoglobin: 13.4
MCV: 90.6
Platelets: 294
RDW: 13.3
WBC: 6.9

## 2013-02-13 ENCOUNTER — Ambulatory Visit: Payer: Self-pay | Admitting: Dietician

## 2013-02-21 ENCOUNTER — Ambulatory Visit
Admission: RE | Admit: 2013-02-21 | Discharge: 2013-02-21 | Disposition: A | Discharge status: Home / Self Care | Payer: Medicare Other | Source: Ambulatory Visit | Attending: Family Medicine | Admitting: Family Medicine

## 2013-02-21 ENCOUNTER — Other Ambulatory Visit: Payer: Self-pay | Admitting: Family Medicine

## 2013-02-21 DIAGNOSIS — R05 Cough: Secondary | ICD-10-CM

## 2013-02-21 DIAGNOSIS — R059 Cough, unspecified: Secondary | ICD-10-CM

## 2013-03-04 ENCOUNTER — Emergency Department (HOSPITAL_BASED_OUTPATIENT_CLINIC_OR_DEPARTMENT_OTHER): Payer: Medicare Other

## 2013-03-04 ENCOUNTER — Emergency Department (HOSPITAL_BASED_OUTPATIENT_CLINIC_OR_DEPARTMENT_OTHER)
Admission: EM | Admit: 2013-03-04 | Discharge: 2013-03-04 | Disposition: A | Discharge status: Home / Self Care | Payer: Medicare Other | Attending: Emergency Medicine | Admitting: Emergency Medicine

## 2013-03-04 ENCOUNTER — Encounter (HOSPITAL_BASED_OUTPATIENT_CLINIC_OR_DEPARTMENT_OTHER): Payer: Self-pay | Admitting: *Deleted

## 2013-03-04 DIAGNOSIS — R05 Cough: Secondary | ICD-10-CM

## 2013-03-04 DIAGNOSIS — R059 Cough, unspecified: Secondary | ICD-10-CM | POA: Insufficient documentation

## 2013-03-04 DIAGNOSIS — Z79899 Other long term (current) drug therapy: Secondary | ICD-10-CM | POA: Insufficient documentation

## 2013-03-04 LAB — CBC WITH DIFFERENTIAL/PLATELET
Basophils Absolute: 0 10*3/uL (ref 0.0–0.1)
Lymphocytes Relative: 35 % (ref 12–46)
Lymphs Abs: 3.5 10*3/uL (ref 0.7–4.0)
Neutro Abs: 5.1 10*3/uL (ref 1.7–7.7)
Neutrophils Relative %: 51 % (ref 43–77)
Platelets: 267 10*3/uL (ref 150–400)
RBC: 4.56 MIL/uL (ref 3.87–5.11)
RDW: 13.5 % (ref 11.5–15.5)
WBC: 10 10*3/uL (ref 4.0–10.5)

## 2013-03-04 LAB — D-DIMER, QUANTITATIVE: D-Dimer, Quant: 0.55 ug/mL-FEU — ABNORMAL HIGH (ref 0.00–0.48)

## 2013-03-04 NOTE — ED Notes (Signed)
Patient has been having asthma like symptoms.  Saw her MD and was given antibiotics, steroids and finished all of them last week but feels like she is getting worse.

## 2013-03-04 NOTE — ED Provider Notes (Signed)
CSN: [id number]     Arrival date & time 03/04/13  2059 History   First MD Initiated Contact with Patient 03/04/13 2200     Chief Complaint  Patient presents with  . Cough   (Consider location/radiation/quality/duration/timing/severity/associated sxs/prior Treatment) HPI 57 year old Gonzales with a history of left lower extremity weakness secondary to polio who presents today with a several week history of feeling dyspneic. She has had coughing associated with this. She has been seen by her primary care doctor several times and has had 2 rounds of steroids and has been placed on inhalers. She states she's also had multiple rounds of antibiotics. She states that it feels like she cannot take a good deep breath. She voices some concern that this could be post polio syndrome. She had an episode of coughing this evening and came in secondary to increased coughing. She describes mucous as white in nature. She's also had some hoarseness although no definitive source throat. She denies any difficulty swallowing. She has had some reflux in the past but states she has not had problems with this and she has been on Protonix. She denies fever, chest pain, vomiting, or diarrhea. She denies any swelling beyond normal and her left leg. She states her left leg has been swollen her entire life she has multiple surgeries secondary to polio.  History reviewed. No pertinent past medical history. History reviewed. No pertinent past surgical history. History reviewed. No pertinent family history. History  Substance Use Topics  . Smoking status: Never Smoker   . Smokeless tobacco: Not on file  . Alcohol Use: No   OB History   Grav Para Term Preterm Abortions TAB SAB Ect Mult Living                 Review of Systems  All other systems reviewed and are negative.    Allergies  Amoxicillin; Percocet; and Sulfa antibiotics  Home Medications   Current Outpatient Rx  Name  Route  Sig  Dispense  Refill  .  pantoprazole (PROTONIX) 40 MG tablet   Oral   Take 40 mg by mouth daily.         . traZODone (DESYREL) 50 MG tablet   Oral   Take 50 mg by mouth at bedtime.         Marland Kitchen venlafaxine (EFFEXOR) 37.5 MG tablet   Oral   Take 37.5 mg by mouth every morning.          BP 137/98  Pulse 78  Temp(Src) 98.3 F (36.8 C) (Oral)  Resp 20  Ht 5\' 8"  (1.727 m)  Wt 260 lb (117.935 kg)  BMI 39.54 kg/m2  SpO2 98% Physical Exam  Nursing note and vitals reviewed. Constitutional: She is oriented to person, place, and time. She appears well-developed and well-nourished.  HENT:  Head: Normocephalic and atraumatic.  Right Ear: External ear normal.  Left Ear: External ear normal.  Nose: Nose normal.  Mouth/Throat: Oropharynx is clear and moist.  Eyes: Conjunctivae and EOM are normal. Pupils are equal, round, and reactive to light.  Neck: Normal range of motion. Neck supple.  Cardiovascular: Normal rate, regular rhythm, normal heart sounds and intact distal pulses.   Pulmonary/Chest: Effort normal and breath sounds normal. No respiratory distress. She has no wheezes. She has no rales.  Abdominal: Soft. Bowel sounds are normal.  Musculoskeletal: Normal range of motion.  Left lower extremity is larger than the right lower extremity. No cords are noted  Neurological: She is alert and  oriented to person, place, and time.  Skin: Skin is warm and dry.  Psychiatric: She has a normal mood and affect. Her behavior is normal. Judgment and thought content normal.    ED Course  Procedures (including critical care time) Labs Review Labs Reviewed  D-DIMER, QUANTITATIVE  CBC WITH DIFFERENTIAL   Imaging Review Dg Chest 2 View  03/04/2013   CLINICAL DATA:  Cough and wheezing.  EXAM: CHEST  2 VIEW  COMPARISON:  02/21/2013 and 10/08/2009.  FINDINGS: The heart size and mediastinal contours are stable. There is stable mild chronic lung disease. No hyperinflation, confluent airspace opacity or pleural effusion  is seen. Degenerative changes are present throughout the thoracic spine. Cholecystectomy clips are noted.  IMPRESSION: Stable chronic lung disease. No acute cardiopulmonary process.   Electronically Signed   By: Roxy Horseman   On: 03/04/2013 22:05   Results for orders placed during the hospital encounter of 03/04/13  D-DIMER, QUANTITATIVE      Result Value Range   D-Dimer, Quant 0.55 (*) 0.00 - 0.48 ug/mL-FEU  CBC WITH DIFFERENTIAL      Result Value Range   WBC 10.0  4.0 - 10.5 K/uL   RBC 4.56  3.87 - 5.11 MIL/uL   Hemoglobin 14.4  12.0 - 15.0 g/dL   HCT 14.7  82.9 - 56.2 %   MCV 92.8  78.0 - 100.0 fL   MCH 31.6  26.0 - 34.0 pg   MCHC 34.0  30.0 - 36.0 g/dL   RDW 13.0  86.5 - 78.4 %   Platelets 267  150 - 400 K/uL   Neutrophils Relative % 51  43 - Debra %   Neutro Abs 5.1  1.7 - 7.7 K/uL   Lymphocytes Relative 35  12 - 46 %   Lymphs Abs 3.5  0.7 - 4.0 K/uL   Monocytes Relative 8  3 - 12 %   Monocytes Absolute 0.8  0.1 - 1.0 K/uL   Eosinophils Relative 6 (*) 0 - 5 %   Eosinophils Absolute 0.6  0.0 - 0.7 K/uL   Basophils Relative 0  0 - 1 %   Basophils Absolute 0.0  0.0 - 0.1 K/uL    MDM  No diagnosis found. 57 y.o. Gonzales wi;th history of polio with cough and dysnpea for several weeks.  SHe has been seen multiple times and evaluated by her pmd.  She has some chronic lung changes on her cxr.  D-dimer is 0.55 which  Is minimally elevated and given otherwise negative work up is likely within normal limits for this patient.  She will follow up with her pmd and continue nebs.    Hilario Quarry, MD 03/04/13 2252

## 2013-04-10 ENCOUNTER — Encounter: Payer: Self-pay | Admitting: Family Medicine

## 2013-04-10 ENCOUNTER — Ambulatory Visit (INDEPENDENT_AMBULATORY_CARE_PROVIDER_SITE_OTHER): Payer: Medicare Other | Admitting: Family Medicine

## 2013-04-10 DIAGNOSIS — E669 Obesity, unspecified: Secondary | ICD-10-CM

## 2013-04-10 NOTE — Progress Notes (Signed)
Medical Nutrition Therapy:  Appt start time: 1000 end time:  1100.  Assessment:  Primary concerns today: Weight management.  [name] has had 3 hip replacements, and she has post-polio syndrome; has been wheelchair bound for 8 years.  We did not check height or weight today b/c of her wheelchair use.  Just last week she was diagnosed with lupus as well as sleep apnea.  She has had a cholecystectomy.  She wants to lose weight to get healthier.   Usual eating pattern is erratic. Usual physical activity is limited due to being wheelchair bound.   [name] said she just has not made food and nutrition a priority, which she recognizes is a problem, but also recognizes that food has been a coping mechanism to some degree with so many limitations due to her health condition.    Frequent foods include sweets, 32-64 oz caf-free tea, cheese on toast, and 12 oz skim milk daily.  Avoided foods include caffeinated drinks, spicy and acidic foods, most vegetables.    24-hr recall: (Up at 5:30 AM) B (11:30 AM)-  [employer] steak biscuit, diet Coke Snk ( AM)-   none L ( PM)-  none Snk ( PM)-  none D (8 PM)-  1 c tomato soup, pimiento cheese sandwich, sweetened green tea Fell asleep ~9 PM Snk (11:30)-  6 pb crackers (Nabs) Yesterday was atypical b/c [name] was busy and running errands.    Progress Towards Goal(s):  In progress.   Nutritional Diagnosis:  NI-1.5 Excessive energy intake As related to expenditure.  As evidenced by BMI >39 as measured in ER on 03/04/13.    Intervention:  Nutrition education.  Monitoring/Evaluation:  Dietary intake, exercise, and body weight in 6 week(s).

## 2013-04-10 NOTE — Patient Instructions (Addendum)
-   Eat at least 3 meals and 1-2 snacks per day.  Aim for no more than 5 hours between eating.   - This will mean making sure you have the right foods on hand.    - It will also be helpful to keep sweets out of the house.    - Include a source of protein with each meal.    - Breakfast:  Eat within 1 hour of getting up.  If you use cereal, choose one with at least 5 g of fiber per serving.    - TASTE PREFERENCES ARE LEARNED.  This means that it will get easier to choose foods you know are good for you if you are exposed to them enough.    - To learn to like tea without sweetener, MEASURE carefully the amount of sweetener you use, and cut back by 1 tsp for a month, then another tsp the next            month, etc.  (1 tbsp = 3 tsp.)  This process will work faster and better for you if you are CONSISTENT with tea that is less sweet.     - TOLERATING - ACCEPTING - "IT'S OK" - LIKING IT - LOVING/PREFERRING IT.    - Make three lists of vegetables: (1) those you like and eat now; (2) vegetables you won't even consider; and (3) vegetables you might consider trying if they are prepared a certain way.  Continue to eat veg's you currently eat, but from this last list, choose a vegetable to try at least 3 times a week.  Use small amounts of this vegetable, cut small, combined with foods or seasonings you like.    - The best fat to use in food preparation is X-virgin olive oil.  Try stir-frying some veg's (like greens!) and roasting with olive oil spray.    - Obtain twice as many veg's as protein or carbohydrate foods for both lunch and dinner.   - A suggested meal:  Sweet potato, chicken, vegetable (cooked, raw, or salad).  The chicken and potatoes can be done ahead of time.    - Consider the 6-week Weigh to Wellness class that begins Jan 6 (follow-up session Feb 24).

## 2013-04-12 DIAGNOSIS — E669 Obesity, unspecified: Secondary | ICD-10-CM | POA: Insufficient documentation

## 2013-05-08 ENCOUNTER — Ambulatory Visit: Payer: Medicare Other | Admitting: Family Medicine

## 2013-05-29 ENCOUNTER — Ambulatory Visit: Payer: Medicare Other | Admitting: Family Medicine

## 2013-10-19 ENCOUNTER — Encounter (INDEPENDENT_AMBULATORY_CARE_PROVIDER_SITE_OTHER): Payer: Self-pay | Admitting: General Surgery

## 2013-10-19 ENCOUNTER — Ambulatory Visit (INDEPENDENT_AMBULATORY_CARE_PROVIDER_SITE_OTHER): Payer: Medicare Other | Admitting: General Surgery

## 2013-10-19 VITALS — BP 128/78 | HR 74 | Temp 98.5°F | Resp 68 | Ht 68.0 in | Wt 260.0 lb

## 2013-10-19 DIAGNOSIS — R229 Localized swelling, mass and lump, unspecified: Secondary | ICD-10-CM

## 2013-10-19 NOTE — Progress Notes (Signed)
Chief complaint: Painful subcutaneous nodule left arm  History: Patient returns to the office. I know her well from previous surgery including umbilical hernia repair and laparoscopic cholecystectomy. She has a history of polio and is wheelchair-bound. She for several months has noted increasingly painful small hard nodule which is on the posterior aspect of her proximal left forearm. It is a particular problem for her as she has to rest her arm on her wheelchair or rest right at this point. It has gotten a little bit larger and more painful over time.  Past Medical History  Diagnosis Date  . Arthritis    History reviewed. No pertinent past surgical history. Current Outpatient Prescriptions  Medication Sig Dispense Refill  . ALPRAZolam (XANAX) 0.5 MG tablet Take 0.5 mg by mouth at bedtime as needed for sleep.      Marland Kitchen aspirin 81 MG tablet Take 81 mg by mouth daily.      . Cholecalciferol (D3 HIGH POTENCY) 1000 UNITS capsule Take 1,000 Units by mouth daily.      . furosemide (LASIX) 20 MG tablet       . hydroxychloroquine (PLAQUENIL) 200 MG tablet Take 400 mg by mouth daily.      . pantoprazole (PROTONIX) 40 MG tablet Take 40 mg by mouth daily.      . traZODone (DESYREL) 50 MG tablet Take 150 mg by mouth at bedtime.       Marland Kitchen venlafaxine (EFFEXOR) 37.5 MG tablet Take 37.5 mg by mouth every morning.      . vitamin C (ASCORBIC ACID) 500 MG tablet Take 500 mg by mouth daily.       No current facility-administered medications for this visit.   Allergies  Allergen Reactions  . Amoxicillin   . Percocet [Oxycodone-Acetaminophen]   . Sulfa Antibiotics    Exam: BP 128/78  Pulse 74  Temp(Src) 98.5 F (36.9 C)  Resp 68  Ht 5\' 8"  (1.727 m)  Wt 260 lb (117.935 kg)  BMI 39.54 kg/m2 General: Obese Caucasian female wheelchair-bound Skin: Rash infection Lungs: Clear equal breath sounds bilaterally Cardiac: Regular rate and rhythm no murmurs Abdomen: Well-healed incisions. No evidence of recurrent  hernia Extremities: On the volar aspect of the left proximal forearm is a 1 cm rounded somewhat firm freely movable subcutaneous mass  Assessment and plan: Symptomatic and enlarging subcutaneous mass left forearm. Possible fibroma or fibrous lipoma or neuroma. I've recommended excision under local anesthesia of 4 treatment a diagnosis. We discussed risks of wound healing or infection or persistent pain and slight risk of recurrence. She is in agreement.

## 2013-11-08 ENCOUNTER — Encounter: Payer: Self-pay | Admitting: Cardiology

## 2013-11-08 ENCOUNTER — Ambulatory Visit (INDEPENDENT_AMBULATORY_CARE_PROVIDER_SITE_OTHER): Payer: Medicare Other | Admitting: Cardiology

## 2013-11-08 ENCOUNTER — Ambulatory Visit: Payer: Medicare Other | Admitting: Internal Medicine

## 2013-11-08 VITALS — BP 118/68 | HR 71 | Ht 68.0 in | Wt 260.0 lb

## 2013-11-08 DIAGNOSIS — I350 Nonrheumatic aortic (valve) stenosis: Secondary | ICD-10-CM

## 2013-11-08 DIAGNOSIS — I359 Nonrheumatic aortic valve disorder, unspecified: Secondary | ICD-10-CM

## 2013-11-08 DIAGNOSIS — R609 Edema, unspecified: Secondary | ICD-10-CM

## 2013-11-08 NOTE — Progress Notes (Signed)
HPI The patient has no prior cardiac history. However, she's had some increased lower extremity swelling. This has been going on for several weeks. She has had some mild swelling in her left leg for some time but this has involved her right leg which is unusual. She's had the Lasix. This is quite inconvenient for her and she is in a wheelchair and post polio syndrome. It does go down if she takes her diuretic and she has the last couple of days. The swelling is actually gone on the right leg. She did as part of workup for cough have a CT and we are told that it describe "aortic stenosis". However, we don't have this result. She's never been told she had any cardiac issues. She's never had any chest pressure, neck or arm discomfort. He does not routine shortness of breath and doesn't describe PND or orthopnea. She gets around in a wheelchair and sleeps in a hospital. She's not any palpitations, presyncope or syncope.  Allergies  Allergen Reactions  . Flagyl [Metronidazole] Anaphylaxis  . Amoxicillin Swelling  . Other Nausea Only    Tylenol with Codeine #3  . Percocet [Oxycodone-Acetaminophen] Nausea Only  . Plaquenil [Hydroxychloroquine Sulfate] Other (See Comments)    Blurred vision, joint pain  . Sulfa Antibiotics Nausea Only    Current Outpatient Prescriptions  Medication Sig Dispense Refill  . ALPRAZolam (XANAX) 0.5 MG tablet Take 0.5 mg by mouth 2 (two) times daily as needed for sleep.       Marland Kitchen aspirin 81 MG tablet Take 81 mg by mouth daily.      . Cholecalciferol (D3 HIGH POTENCY) 1000 UNITS capsule Take 1,000 Units by mouth daily.      . clotrimazole-betamethasone (LOTRISONE) cream Apply 1 application topically 2 (two) times daily.      . furosemide (LASIX) 20 MG tablet Take 20 mg by mouth daily as needed.       . pantoprazole (PROTONIX) 40 MG tablet Take 40 mg by mouth daily.      . potassium chloride (K-DUR,KLOR-CON) 10 MEQ tablet Take 10 mEq by mouth daily as needed.      .  traZODone (DESYREL) 100 MG tablet Take 150 mg by mouth at bedtime.      Marland Kitchen venlafaxine XR (EFFEXOR-XR) 37.5 MG 24 hr capsule Take 37.5 mg by mouth daily with breakfast.      . vitamin C (ASCORBIC ACID) 500 MG tablet Take 500 mg by mouth daily.       No current facility-administered medications for this visit.    Past Medical History  Diagnosis Date  . Arthritis   . Post-polio syndrome     With left leg paralysis  . Paralysis     Left leg, from post polio syndrome  . Sigmoid diverticulosis   . Connective tissue disease   . Aortic stenosis     On CT  . Peripheral edema   . Diverticulitis   . Cervical pain (neck)   . Depression   . GERD (gastroesophageal reflux disease)   . Insomnia   . History of fracture of left hip   . Hyperlipidemia     LDL goal >130  . Anxiety   . Postmenopausal   . Family history of diabetes mellitus   . Post-viral reactive airway disease     No past surgical history on file.  Family History  Problem Relation Age of Onset  . Cancer Mother   . Heart disease Father   . CAD  Other     FAM Hx OF CAD  . Diabetes Other     FAM Hx of DM    History   Social History  . Marital Status: Single    Spouse Name: N/A    Number of Children: N/A  . Years of Education: N/A   Occupational History  . Not on file.   Social History Main Topics  . Smoking status: Never Smoker   . Smokeless tobacco: Not on file  . Alcohol Use: No  . Drug Use: Not on file  . Sexual Activity: Not on file   Other Topics Concern  . Not on file   Social History Narrative  . No narrative on file    ROS:  Positive for reflux.  Otherwise as stated in the HPI and negative for all other systems.  PHYSICAL EXAM BP 118/68  Ht 5\' 8"  (1.727 m)  Wt 260 lb (117.935 kg)  BMI 39.54 kg/m2 PHYSICAL EXAM GEN:  No distress NECK:  No jugular venous distention at 90 degrees, waveform within normal limits, carotid upstroke brisk and symmetric, no bruits, no thyromegaly LYMPHATICS:  No  cervical adenopathy LUNGS:  Clear to auscultation bilaterally BACK:  No CVA tenderness CHEST:  Unremarkable HEART:  S1 and S2 within normal limits, no S3, no S4, no clicks, no rubs, 2/6 systolic murmur at the right upper sternal border, no diastolic murmurs ABD:  Positive bowel sounds normal in frequency in pitch, no bruits, no rebound, no guarding, unable to assess midline mass or bruit with the patient seated. EXT:  2 plus pulses throughout, mild left leg edema, no cyanosis no clubbing SKIN:  No rashes no nodules NEURO:  Cranial nerves II through XII grossly intact, motor grossly intact throughout PSYCH:  Cognitively intact, oriented to person place and time   EKG: sinus rhythm, rate 71, axis within normal limits, intervals within normal limits, no acute ST-T wave changes. 11/08/2013  ASSESSMENT AND PLAN  EDEMA:  I suspect that this is related to her inability to walk, the fact that her feet are on the ground most of the day and probably some venous insufficiency. I do not strongly suspect heart failure. I will however check an echocardiogram. She would like to avoid the diuretic and we discussed conservative mechanical therapies for this as well as salt and fluid restriction.  MURMUR:  I suspect some mild aortic sclerosis. However, I would not suspect significant valvular disease. We will follow this with the echo.  OBESITY:  Unfortunately this will be difficult and she can be mobile. She will continue to work on this with calorie restriction.

## 2013-11-08 NOTE — Patient Instructions (Signed)
The current medical regimen is effective;  continue present plan and medications.  Your physician has requested that you have an echocardiogram. Echocardiography is a painless test that uses sound waves to create images of your heart. It provides your doctor with information about the size and shape of your heart and how well your heart's chambers and valves are working. This procedure takes approximately one hour. There are no restrictions for this procedure.  Follow up as needed. 

## 2013-11-09 DIAGNOSIS — R609 Edema, unspecified: Secondary | ICD-10-CM | POA: Insufficient documentation

## 2013-11-14 ENCOUNTER — Other Ambulatory Visit (INDEPENDENT_AMBULATORY_CARE_PROVIDER_SITE_OTHER): Payer: Self-pay | Admitting: General Surgery

## 2013-11-14 ENCOUNTER — Other Ambulatory Visit (INDEPENDENT_AMBULATORY_CARE_PROVIDER_SITE_OTHER): Payer: Self-pay

## 2013-11-14 DIAGNOSIS — D1739 Benign lipomatous neoplasm of skin and subcutaneous tissue of other sites: Secondary | ICD-10-CM

## 2013-11-16 ENCOUNTER — Telehealth (INDEPENDENT_AMBULATORY_CARE_PROVIDER_SITE_OTHER): Payer: Self-pay | Admitting: General Surgery

## 2013-11-16 NOTE — Telephone Encounter (Signed)
Tried to palpation with path report, left message

## 2013-11-23 ENCOUNTER — Telehealth (INDEPENDENT_AMBULATORY_CARE_PROVIDER_SITE_OTHER): Payer: Self-pay | Admitting: General Surgery

## 2013-11-23 ENCOUNTER — Telehealth: Payer: Self-pay | Admitting: Cardiology

## 2013-11-23 DIAGNOSIS — I350 Nonrheumatic aortic (valve) stenosis: Secondary | ICD-10-CM

## 2013-11-23 NOTE — Telephone Encounter (Signed)
Spoke with pt who has the CT scan report from Dr Sharyon Medicus that shows "Aortic Stenosis" - test was done 03/2013.  She is going to bring it to the office on Monday to be reviewed and to determined when she should have a follow 2 D Echo.

## 2013-11-23 NOTE — Telephone Encounter (Signed)
New Message:  Pt is requesting Pam call her back... She states it is in reference to what Dr. Percival Spanish told her.. States she came to our office b/c of what was seen on a CT.Marland Kitchen She states Dr. Percival Spanish told her that one cannot conclude that diagnosis based on a CT... She is requesting to spea kto PAmm

## 2013-11-23 NOTE — Telephone Encounter (Signed)
Called the patient to discuss path report. She is doing well following surgery.

## 2013-11-28 NOTE — Telephone Encounter (Signed)
Received CT report - will forward to Dr Percival Spanish for review and recommendations.

## 2013-11-29 ENCOUNTER — Other Ambulatory Visit (HOSPITAL_COMMUNITY): Payer: Medicare Other

## 2013-12-07 NOTE — Telephone Encounter (Signed)
Pt aware the need for a 2 D Echo to follow up AS - aware she will be called with an appt.

## 2013-12-25 ENCOUNTER — Other Ambulatory Visit (HOSPITAL_COMMUNITY): Payer: Medicare Other

## 2013-12-27 ENCOUNTER — Ambulatory Visit (HOSPITAL_COMMUNITY)
Admission: RE | Admit: 2013-12-27 | Discharge: 2013-12-27 | Disposition: A | Discharge status: Home / Self Care | Payer: Medicare Other | Source: Ambulatory Visit | Attending: Family Medicine | Admitting: Family Medicine

## 2013-12-27 DIAGNOSIS — I359 Nonrheumatic aortic valve disorder, unspecified: Secondary | ICD-10-CM | POA: Insufficient documentation

## 2013-12-27 DIAGNOSIS — A809 Acute poliomyelitis, unspecified: Secondary | ICD-10-CM | POA: Insufficient documentation

## 2013-12-27 DIAGNOSIS — I350 Nonrheumatic aortic (valve) stenosis: Secondary | ICD-10-CM

## 2013-12-27 NOTE — Progress Notes (Signed)
Echocardiogram 2D Echocardiogram has been performed.  [name] 12/27/2013, 12:28 PM

## 2013-12-31 ENCOUNTER — Telehealth: Payer: Self-pay | Admitting: Cardiology

## 2013-12-31 NOTE — Telephone Encounter (Signed)
New message ° ° ° ° ° °Want echo results °

## 2013-12-31 NOTE — Telephone Encounter (Signed)
Pt. Informed of her echo results 

## 2014-01-18 ENCOUNTER — Ambulatory Visit (INDEPENDENT_AMBULATORY_CARE_PROVIDER_SITE_OTHER): Payer: Medicare Other | Admitting: General Surgery

## 2014-01-18 ENCOUNTER — Encounter (INDEPENDENT_AMBULATORY_CARE_PROVIDER_SITE_OTHER): Payer: Self-pay | Admitting: General Surgery

## 2014-01-18 VITALS — BP 128/72 | HR 75 | Temp 97.0°F

## 2014-01-18 DIAGNOSIS — IMO0001 Reserved for inherently not codable concepts without codable children: Secondary | ICD-10-CM

## 2014-01-18 DIAGNOSIS — IMO0002 Reserved for concepts with insufficient information to code with codable children: Secondary | ICD-10-CM

## 2014-01-18 DIAGNOSIS — T888XXA Other specified complications of surgical and medical care, not elsewhere classified, initial encounter: Principal | ICD-10-CM

## 2014-01-18 DIAGNOSIS — T792XXA Traumatic secondary and recurrent hemorrhage and seroma, initial encounter: Principal | ICD-10-CM

## 2014-01-18 NOTE — Progress Notes (Signed)
History: Patient is about 2 months following removal of a small lipoma with fat necrosis from the back of her left arm. This was causing a lot of pain because where she has to rest her arm or her wheelchair. She noticed initially a little swelling this has gotten larger and harder and tender again when she rests her arm.  Exam: Incision just below the left elbow appears well healed but there is an approximately 1-1/2 cm seroma palpable. After explaining the procedure I aspirated this with an 18-gauge needle and got about 2 cc of serosanguineous fluid with near resolution of the swelling.  Assessment and plan: Postop seroma, improved today after aspiration. She will monitor the area and if she has recurrent swelling will call for a return appointment.

## 2014-09-24 ENCOUNTER — Other Ambulatory Visit: Payer: Self-pay | Admitting: Gastroenterology

## 2014-12-28 ENCOUNTER — Emergency Department (HOSPITAL_COMMUNITY): Payer: Medicare Other

## 2014-12-28 ENCOUNTER — Emergency Department (HOSPITAL_COMMUNITY)
Admission: EM | Admit: 2014-12-28 | Discharge: 2014-12-28 | Disposition: A | Discharge status: Home / Self Care | Payer: Medicare Other | Attending: Emergency Medicine | Admitting: Emergency Medicine

## 2014-12-28 DIAGNOSIS — Z7982 Long term (current) use of aspirin: Secondary | ICD-10-CM | POA: Diagnosis not present

## 2014-12-28 DIAGNOSIS — M1990 Unspecified osteoarthritis, unspecified site: Secondary | ICD-10-CM | POA: Diagnosis not present

## 2014-12-28 DIAGNOSIS — F329 Major depressive disorder, single episode, unspecified: Secondary | ICD-10-CM | POA: Diagnosis not present

## 2014-12-28 DIAGNOSIS — Z8639 Personal history of other endocrine, nutritional and metabolic disease: Secondary | ICD-10-CM | POA: Insufficient documentation

## 2014-12-28 DIAGNOSIS — Z880 Allergy status to penicillin: Secondary | ICD-10-CM | POA: Insufficient documentation

## 2014-12-28 DIAGNOSIS — Z8781 Personal history of (healed) traumatic fracture: Secondary | ICD-10-CM | POA: Insufficient documentation

## 2014-12-28 DIAGNOSIS — Z780 Asymptomatic menopausal state: Secondary | ICD-10-CM | POA: Diagnosis not present

## 2014-12-28 DIAGNOSIS — F419 Anxiety disorder, unspecified: Secondary | ICD-10-CM | POA: Insufficient documentation

## 2014-12-28 DIAGNOSIS — M25532 Pain in left wrist: Secondary | ICD-10-CM | POA: Diagnosis present

## 2014-12-28 DIAGNOSIS — Z79899 Other long term (current) drug therapy: Secondary | ICD-10-CM | POA: Diagnosis not present

## 2014-12-28 DIAGNOSIS — K219 Gastro-esophageal reflux disease without esophagitis: Secondary | ICD-10-CM | POA: Insufficient documentation

## 2014-12-28 DIAGNOSIS — G4700 Insomnia, unspecified: Secondary | ICD-10-CM | POA: Diagnosis not present

## 2014-12-28 DIAGNOSIS — J45909 Unspecified asthma, uncomplicated: Secondary | ICD-10-CM | POA: Diagnosis not present

## 2014-12-28 MED ORDER — HYDROCODONE-ACETAMINOPHEN 5-325 MG PO TABS
1.0000 | ORAL_TABLET | Freq: Once | ORAL | Status: AC
  Administered 2014-12-28: 1 via ORAL
  Filled 2014-12-28: qty 1

## 2014-12-28 MED ORDER — HYDROCODONE-ACETAMINOPHEN 5-325 MG PO TABS: 1.0000 | ORAL_TABLET | Freq: Four times a day (QID) | ORAL | Status: DC | PRN

## 2014-12-28 NOTE — ED Provider Notes (Signed)
CSN: [id number]     Arrival date & time 12/28/14  1652 History  This chart was scribed for non-physician practitioner, Okey Regal, PA-C, working with Wandra Arthurs, MD, by Stephania Fragmin, ED Scribe. This patient was seen in room [place] and the patient's care was started at 5:15 PM.    Chief Complaint  Patient presents with  . Wrist Pain   The history is provided by the patient. No language interpreter was used.    HPI Comments: [name] is a 59 y.o. female with a history of post-polio syndrome and arthritis, who presents to the Emergency Department complaining of constant, gradual onset, worsening, severe, sore left wrist pain occasionally radiating up towards her left elbow that began 2 days ago and has been "unbearable" today. She notes associated swelling and some occasional tingling to the area. She denies any known trauma, injury, or any recent change in activity, but she states it feels like her left wrist is broken, although she adds she does not think she broke it. She states her wrist has been sore like this in the past, but the pain has never been this severe. She states she is unable to rotate her wrist secondary to pain. Movement exacerbates the pain. Nothing makes it better. Patient has taken 4 ibuprofen tablets, last taken last night, and Tylenol with no relief. She also tried applying heat with no relief. Patient sees a rheumatologist for arthritis who she has not contacted yet, as patient received a letter in the mail that her doctor will soon leave the practice and did not think she could schedule an appointment. She denies a history of gout. She denies EtOH consumption, but notes she did eat a large steak last week. She reports a history of post-polio syndrome with left leg paralysis and ambulates with a wheelchair. Patient states she is "scared to death" that her injury will rob her of her independence, as she worries it will debilitate her from being able to push off her chair.  She denies left elbow pain. She denies having any narcotic pain medication at home. PCP: FULP, CAMMIE, MD patient denies systemic symptoms including fever, chills, nausea, vomiting, dizziness. No other joints involved.   Past Medical History  Diagnosis Date  . Arthritis     low back  . Post-polio syndrome     With left leg paralysis  . Sigmoid diverticulosis   . Connective tissue disease     questionable Lupus (she has yet to see a rheumatologist)  . Peripheral edema   . Diverticulitis   . Cervical pain (neck)   . Depression   . GERD (gastroesophageal reflux disease)   . Insomnia   . History of fracture of left hip   . Hyperlipidemia   . Anxiety   . Postmenopausal   . Post-viral reactive airway disease    Past Surgical History  Procedure Laterality Date  . Total hip arthroplasty      right x 3  . Ankle fusion      left  . Leg surgery      muscle surgery related to polio  . Knee arthroplasty    . Cholecystectomy    . Umbilical hernia repair    . Appendectomy     Family History  Problem Relation Age of Onset  . Cancer Mother   . Heart disease Father   . CAD Other     FAM Hx OF CAD  . Diabetes Other     FAM Hx  of DM   History  Substance Use Topics  . Smoking status: Never Smoker   . Smokeless tobacco: Not on file  . Alcohol Use: No   OB History    No data available     Review of Systems  All other systems reviewed and are negative.  Allergies  Flagyl; Amoxicillin; Other; Percocet; Plaquenil; and Sulfa antibiotics  Home Medications   Prior to Admission medications   Medication Sig Start Date End Date Taking? Authorizing Provider  ALPRAZolam Duanne Moron) 0.5 MG tablet Take 0.5 mg by mouth 2 (two) times daily as needed for sleep.     Historical Provider, MD  aspirin 81 MG tablet Take 81 mg by mouth daily.    Historical Provider, MD  Cholecalciferol (D3 HIGH POTENCY) 1000 UNITS capsule Take 1,000 Units by mouth daily.    Historical Provider, MD   clotrimazole-betamethasone (LOTRISONE) cream Apply 1 application topically 2 (two) times daily.    Historical Provider, MD  furosemide (LASIX) 20 MG tablet Take 20 mg by mouth daily as needed.  10/17/13   Historical Provider, MD  HYDROcodone-acetaminophen (NORCO/VICODIN) 5-325 MG per tablet Take 1 tablet by mouth every 6 (six) hours as needed. 12/28/14   Okey Regal, PA-C  pantoprazole (PROTONIX) 40 MG tablet Take 40 mg by mouth daily.    Historical Provider, MD  potassium chloride (K-DUR,KLOR-CON) 10 MEQ tablet Take 10 mEq by mouth daily as needed.    Historical Provider, MD  traZODone (DESYREL) 100 MG tablet Take 150 mg by mouth at bedtime.    Historical Provider, MD  venlafaxine XR (EFFEXOR-XR) 37.5 MG 24 hr capsule Take 37.5 mg by mouth daily with breakfast.    Historical Provider, MD  vitamin C (ASCORBIC ACID) 500 MG tablet Take 500 mg by mouth daily.    Historical Provider, MD   BP 150/69 mmHg  Pulse 72  Temp(Src) 97.7 F (36.5 C) (Oral)  Resp 16  SpO2 98%   Physical Exam  Constitutional: She is oriented to person, place, and time. She appears well-developed and well-nourished. No distress.  HENT:  Head: Normocephalic and atraumatic.  Eyes: Conjunctivae and EOM are normal.  Neck: Neck supple. No tracheal deviation present.  Cardiovascular: Normal rate.   Pulmonary/Chest: Effort normal. No respiratory distress.  Musculoskeletal: Normal range of motion.  Very minimal swelling to the distal aspect of the left wrist. Patient was moving hand back and forth while discussing her history, but did not push against my hand when I asked her to do so during exam. Joint is not warm to touch, no redness. Radial pulses 2+, Capillary refill less than 3 seconds. No tenderness to palpation of medial nerve or remainder of hand   Neurological: She is alert and oriented to person, place, and time.  Skin: Skin is warm and dry.  Psychiatric: She has a normal mood and affect. Her behavior is normal.   Nursing note and vitals reviewed.   ED Course  Procedures (including critical care time)  DIAGNOSTIC STUDIES: Oxygen Saturation is 98% on RA, normal by my interpretation.    COORDINATION OF CARE: 5:24 PM - Suspect arthritis. Possibly gout. Doubt carpal tunnel. Discussed treatment plan with pt at bedside which includes ibuprofen, placement in a wrist splint, and Rx Percocet. Pt declines XR at this time. She verbalized understanding and agreed to plan.   Imaging Review Dg Wrist Complete Left  12/28/2014   CLINICAL DATA:  Left wrist pain for 3 days, no known trauma, initial encounter  EXAM: LEFT  WRIST - COMPLETE 3+ VIEW  COMPARISON:  None.  FINDINGS: No acute fracture is noted. Mild degenerative changes in the first [facility] joint are seen. There is some suggestion of widening of the scapholunate space likely related to ligamentous injury.  IMPRESSION: Degenerative changes without acute abnormality.   Electronically Signed   By: Inez Catalina M.D.   On: 12/28/2014 18:02    MDM   Final diagnoses:  Left wrist pain   Labs:   Imaging: DG Wrist Left- no significant findings  Consults:  Therapeutics: Norco/Vicodin 5-325, apply left wrist splint  Discharge Meds: Norco/Vicodin 5-325 x 20 tablets  Assessment/Plan: Patient presents with left wrist pain. History of autoimmune disorder uncertain diagnosis.highly doubt septic or gouty arthritis at this time, patient discharged home with instructions to use ibuprofen, narcotic pain medication only as needed for pain. Close follow-up and encouraged with primary care for further evaluation or management. Patient given strict return precautions, verbalized understanding and agreement for today's plan and had no further questions or concerns at the time of discharge.    I personally performed the services described in this documentation, which was scribed in my presence. The recorded information has been reviewed and is accurate.    Okey Regal,  PA-C 12/28/14 1921  Wandra Arthurs, MD 12/28/14 [phone]

## 2014-12-28 NOTE — Discharge Instructions (Signed)
Arthralgia °Your caregiver has diagnosed you as suffering from an arthralgia. Arthralgia means there is pain in a joint. This can come from many reasons including: °· Bruising the joint which causes soreness (inflammation) in the joint. °· Wear and tear on the joints which occur as we grow older (osteoarthritis). °· Overusing the joint. °· Various forms of arthritis. °· Infections of the joint. °Regardless of the cause of pain in your joint, most of these different pains respond to anti-inflammatory drugs and rest. The exception to this is when a joint is infected, and these cases are treated with antibiotics, if it is a bacterial infection. °HOME CARE INSTRUCTIONS  °· Rest the injured area for as long as directed by your caregiver. Then slowly start using the joint as directed by your caregiver and as the pain allows. Crutches as directed may be useful if the ankles, knees or hips are involved. If the knee was splinted or casted, continue use and care as directed. If an stretchy or elastic wrapping bandage has been applied today, it should be removed and re-applied every 3 to 4 hours. It should not be applied tightly, but firmly enough to keep swelling down. Watch toes and feet for swelling, bluish discoloration, coldness, numbness or excessive pain. If any of these problems (symptoms) occur, remove the ace bandage and re-apply more loosely. If these symptoms persist, contact your caregiver or return to this location. °· For the first 24 hours, keep the injured extremity elevated on pillows while lying down. °· Apply ice for 15-20 minutes to the sore joint every couple hours while awake for the first half day. Then 03-04 times per day for the first 48 hours. Put the ice in a plastic bag and place a towel between the bag of ice and your skin. °· Wear any splinting, casting, elastic bandage applications, or slings as instructed. °· Only take over-the-counter or prescription medicines for pain, discomfort, or fever as  directed by your caregiver. Do not use aspirin immediately after the injury unless instructed by your physician. Aspirin can cause increased bleeding and bruising of the tissues. °· If you were given crutches, continue to use them as instructed and do not resume weight bearing on the sore joint until instructed. °Persistent pain and inability to use the sore joint as directed for more than 2 to 3 days are warning signs indicating that you should see a caregiver for a follow-up visit as soon as possible. Initially, a hairline fracture (break in bone) may not be evident on X-rays. Persistent pain and swelling indicate that further evaluation, non-weight bearing or use of the joint (use of crutches or slings as instructed), or further X-rays are indicated. X-rays may sometimes not show a small fracture until a week or 10 days later. Make a follow-up appointment with your own caregiver or one to whom we have referred you. A radiologist (specialist in reading X-rays) may read your X-rays. Make sure you know how you are to obtain your X-ray results. Do not assume everything is normal if you do not hear from us. °SEEK MEDICAL CARE IF: °Bruising, swelling, or pain increases. °SEEK IMMEDIATE MEDICAL CARE IF:  °· Your fingers or toes are numb or blue. °· The pain is not responding to medications and continues to stay the same or get worse. °· The pain in your joint becomes severe. °· You develop a fever over 102° F (38.9° C). °· It becomes impossible to move or use the joint. °MAKE SURE YOU:  °·   Understand these instructions.  Will watch your condition.  Will get help right away if you are not doing well or get worse. Document Released: 05/31/2005 Document Revised: 08/23/2011 Document Reviewed: 01/17/2008 [facility] Patient Information 2015 [place], [place]. This information is not intended to replace advice given to you by your health care provider. Make sure you discuss any questions you have with your health care  provider.  Lease follow-up with your primary care provider for further evaluation and management. Please return immediately if new worsening signs or symptoms present.

## 2014-12-28 NOTE — ED Notes (Signed)
Patient transported to X-ray 

## 2014-12-28 NOTE — ED Notes (Signed)
Pt reports she began to have L wrist pain last night. Denies any injury. Hx of carpel tunnel, but does not feel the same as her carpel tunnel. L wrist has some visible swelling.

## 2016-03-30 ENCOUNTER — Emergency Department (HOSPITAL_COMMUNITY): Payer: Medicare Other

## 2016-03-30 ENCOUNTER — Encounter (HOSPITAL_COMMUNITY): Payer: Self-pay

## 2016-03-30 ENCOUNTER — Emergency Department (HOSPITAL_COMMUNITY)
Admission: EM | Admit: 2016-03-30 | Discharge: 2016-03-31 | Disposition: A | Discharge status: Home / Self Care | Payer: Medicare Other | Attending: Emergency Medicine | Admitting: Emergency Medicine

## 2016-03-30 DIAGNOSIS — R1030 Lower abdominal pain, unspecified: Secondary | ICD-10-CM | POA: Diagnosis present

## 2016-03-30 DIAGNOSIS — Z79899 Other long term (current) drug therapy: Secondary | ICD-10-CM | POA: Insufficient documentation

## 2016-03-30 DIAGNOSIS — K5732 Diverticulitis of large intestine without perforation or abscess without bleeding: Secondary | ICD-10-CM

## 2016-03-30 DIAGNOSIS — Z7982 Long term (current) use of aspirin: Secondary | ICD-10-CM | POA: Insufficient documentation

## 2016-03-30 LAB — COMPREHENSIVE METABOLIC PANEL
ALBUMIN: 3.7 g/dL (ref 3.5–5.0)
ALK PHOS: 88 U/L (ref 38–126)
ALT: 15 U/L (ref 14–54)
ANION GAP: 14 (ref 5–15)
AST: 27 U/L (ref 15–41)
BILIRUBIN TOTAL: 0.9 mg/dL (ref 0.3–1.2)
BUN: 13 mg/dL (ref 6–20)
CALCIUM: 9 mg/dL (ref 8.9–10.3)
CO2: 18 mmol/L — ABNORMAL LOW (ref 22–32)
Chloride: 108 mmol/L (ref 101–111)
Creatinine, Ser: 0.67 mg/dL (ref 0.44–1.00)
GLUCOSE: 112 mg/dL — AB (ref 65–99)
POTASSIUM: 4.3 mmol/L (ref 3.5–5.1)
Sodium: 140 mmol/L (ref 135–145)
TOTAL PROTEIN: 7.7 g/dL (ref 6.5–8.1)

## 2016-03-30 LAB — LIPASE, BLOOD: LIPASE: 33 U/L (ref 11–51)

## 2016-03-30 LAB — CBC
HEMATOCRIT: 43.9 % (ref 36.0–46.0)
Hemoglobin: 15 g/dL (ref 12.0–15.0)
MCH: 31.5 pg (ref 26.0–34.0)
MCHC: 34.2 g/dL (ref 30.0–36.0)
MCV: 92.2 fL (ref 78.0–100.0)
Platelets: 293 10*3/uL (ref 150–400)
RBC: 4.76 MIL/uL (ref 3.87–5.11)
RDW: 13 % (ref 11.5–15.5)
WBC: 16.4 10*3/uL — ABNORMAL HIGH (ref 4.0–10.5)

## 2016-03-30 LAB — URINALYSIS, ROUTINE W REFLEX MICROSCOPIC
Bilirubin Urine: NEGATIVE
GLUCOSE, UA: NEGATIVE mg/dL
Hgb urine dipstick: NEGATIVE
KETONES UR: NEGATIVE mg/dL
Leukocytes, UA: NEGATIVE
Nitrite: NEGATIVE
PH: 5.5 (ref 5.0–8.0)
Protein, ur: NEGATIVE mg/dL
Specific Gravity, Urine: 1.013 (ref 1.005–1.030)

## 2016-03-30 MED ORDER — IOPAMIDOL (ISOVUE-300) INJECTION 61%
100.0000 mL | Freq: Once | INTRAVENOUS | Status: AC | PRN
  Administered 2016-03-30: 100 mL via INTRAVENOUS

## 2016-03-30 MED ORDER — HYDROCODONE-ACETAMINOPHEN 5-325 MG PO TABS
2.0000 | ORAL_TABLET | Freq: Once | ORAL | Status: AC
  Administered 2016-03-30: 2 via ORAL
  Filled 2016-03-30: qty 2

## 2016-03-30 NOTE — ED Notes (Signed)
Bed: WA01 Expected date:  Expected time:  Means of arrival:  Comments: 60 yo F  N/V/D

## 2016-03-30 NOTE — ED Triage Notes (Signed)
Patient was having cramping when initially took BP, retook BP 136/74

## 2016-03-30 NOTE — ED Triage Notes (Signed)
Patient c/o abdominal pain since today.  Patient states that feels like she is having strong menstrual cramps.  States "I have never had a baby but, feels like a contraction"  Patient denies chest pain and SOB, denies any vaginal bleeding.  States that she feels "sluggish".

## 2016-03-30 NOTE — ED Provider Notes (Signed)
[facility] DEPT Provider Note   CSN: [id number] Arrival date & time: 03/30/16  1823  By signing my name below, I, [name], attest that this documentation has been prepared under the direction and in the presence of Charlann Lange, PA-C Electronically Signed: Soijett Gonzales, ED Scribe. 03/30/16. 9:14 PM.   History   Chief Complaint Chief Complaint  Patient presents with  . Abdominal Pain    HPI  [name] is a 60 y.o. female with a PMHx of post-polio syndrome with left leg paralysis, diverticulitis, sigmoid diverticulosis, who presents to the Emergency Department complaining of lower abdominal pain onset yesterday. Pt reports that her symptoms began with abdominal spasms that she has had in the past. Pt states that her abdominal pain feels like intense menstrual cramps. Pt denies that her lower abdominal pain is any worse with urinating or radiating to her back. Pt notes that she has had appendectomy and cholecystectomy, but denies having a hysterectomy.She states that she is having associated symptoms of vaginal pressure and "it feels like something is falling out". She states that she has not tried any medications for the relief of her symptoms. She denies vaginal discharge, vaginal pain, fever, chills, color change, rash, wound, and any other symptoms.   The history is provided by the patient. No language interpreter was used.    Past Medical History:  Diagnosis Date  . Anxiety   . Arthritis    low back  . Cervical pain (neck)   . Connective tissue disease ([place])    questionable Lupus (she has yet to see a rheumatologist)  . Depression   . Diverticulitis   . GERD (gastroesophageal reflux disease)   . History of fracture of left hip   . Hyperlipidemia   . Insomnia   . Peripheral edema   . Post-polio syndrome    With left leg paralysis  . Post-viral reactive airway disease   . Postmenopausal   . Sigmoid diverticulosis     Patient Active Problem List   Diagnosis Date Noted  . Edema 11/09/2013  . Obesity (BMI 35.0-39.9 without comorbidity) 04/12/2013    Past Surgical History:  Procedure Laterality Date  . ANKLE FUSION     left  . APPENDECTOMY    . CHOLECYSTECTOMY    . KNEE ARTHROPLASTY    . LEG SURGERY     muscle surgery related to polio  . TOTAL HIP ARTHROPLASTY     right x 3  . UMBILICAL HERNIA REPAIR      OB History    No data available       Home Medications    Prior to Admission medications   Medication Sig Start Date End Date Taking? Authorizing Provider  ALPRAZolam Duanne Moron) 0.5 MG tablet Take 0.5 mg by mouth 2 (two) times daily as needed for sleep.    Yes Historical Provider, MD  aspirin 81 MG tablet Take 81 mg by mouth daily.   Yes Historical Provider, MD  buPROPion (WELLBUTRIN) 75 MG tablet Take 75 mg by mouth daily.  02/11/16  Yes Historical Provider, MD  Cholecalciferol (D3 HIGH POTENCY) 1000 UNITS capsule Take 1,000 Units by mouth daily.   Yes Historical Provider, MD  pantoprazole (PROTONIX) 40 MG tablet Take 40 mg by mouth daily.   Yes Historical Provider, MD  traZODone (DESYREL) 100 MG tablet Take 100 mg by mouth at bedtime.    Yes Historical Provider, MD  vitamin B-12 (CYANOCOBALAMIN) 250 MCG tablet Take 250 mcg by mouth daily.  Yes Historical Provider, MD  vitamin C (ASCORBIC ACID) 500 MG tablet Take 500 mg by mouth daily.   Yes Historical Provider, MD  clotrimazole-betamethasone (LOTRISONE) cream Apply 1 application topically 2 (two) times daily.    Historical Provider, MD  furosemide (LASIX) 20 MG tablet Take 20 mg by mouth daily as needed for fluid or edema.  10/17/13   Historical Provider, MD  HYDROcodone-acetaminophen (NORCO/VICODIN) 5-325 MG per tablet Take 1 tablet by mouth every 6 (six) hours as needed. Patient not taking: Reported on 03/30/2016 12/28/14   Okey Regal, PA-C    Family History Family History  Problem Relation Age of Onset  . Cancer Mother   . Heart disease Father   . CAD Other      FAM Hx OF CAD  . Diabetes Other     FAM Hx of DM    Social History Social History  Substance Use Topics  . Smoking status: Never Smoker  . Smokeless tobacco: Never Used  . Alcohol use No     Allergies   Flagyl [metronidazole]; Amoxicillin; Other; Percocet [oxycodone-acetaminophen]; Plaquenil [hydroxychloroquine sulfate]; and Sulfa antibiotics   Review of Systems Review of Systems  Constitutional: Negative for chills and fever.  Respiratory: Negative for shortness of breath.   Cardiovascular: Negative for chest pain.  Gastrointestinal: Positive for abdominal pain (lower). Negative for blood in stool, constipation, nausea and vomiting.  Genitourinary: Positive for pelvic pain. Negative for vaginal discharge and vaginal pain.       +Vaginal pressure  Musculoskeletal: Negative for myalgias.  Skin: Negative for color change, rash and wound.  Neurological: Negative.      Physical Exam Updated Vital Signs BP 136/74 (BP Location: Left Arm)   Pulse 99   Temp 98.9 F (37.2 C) (Oral)   Resp 20   SpO2 95%   Physical Exam  Constitutional: She is oriented to person, place, and time. She appears well-developed and well-nourished. No distress.  HENT:  Head: Normocephalic and atraumatic.  Eyes: EOM are normal.  Neck: Neck supple.  Cardiovascular: Normal rate.   Pulmonary/Chest: Effort normal. No respiratory distress.  Abdominal: She exhibits no distension. There is tenderness (suprapubic abdominal tenderness. ). There is no guarding.  Genitourinary:  Genitourinary Comments: External vagina unremarkable. Bimanual exam shows no CMT, palpable FB. There is no visualized uterine prolapse.   Musculoskeletal: Normal range of motion.  Neurological: She is alert and oriented to person, place, and time.  Skin: Skin is warm and dry.  Psychiatric: She has a normal mood and affect. Her behavior is normal.  Nursing note and vitals reviewed.    ED Treatments / Results  DIAGNOSTIC  STUDIES: Oxygen Saturation is 95% on RA, adequate by my interpretation.    COORDINATION OF CARE: 9:10 PM Discussed treatment plan with pt at bedside which includes labs, UA, pelvic exam, and pt agreed to plan.   Labs (all labs ordered are listed, but only abnormal results are displayed) Labs Reviewed  CBC - Abnormal; Notable for the following:       Result Value   WBC 16.4 (*)    All other components within normal limits  COMPREHENSIVE METABOLIC PANEL - Abnormal; Notable for the following:    CO2 18 (*)    Glucose, Bld 112 (*)    All other components within normal limits  URINALYSIS, ROUTINE W REFLEX MICROSCOPIC (NOT AT [facility])  LIPASE, BLOOD    Radiology Ct Abdomen Pelvis W Contrast  Result Date: 03/30/2016 CLINICAL DATA:  Acute onset of  lower abdominal pain and spasms. Initial encounter. EXAM: CT ABDOMEN AND PELVIS WITH CONTRAST TECHNIQUE: Multidetector CT imaging of the abdomen and pelvis was performed using the standard protocol following bolus administration of intravenous contrast. CONTRAST:  152mL ISOVUE-300 IOPAMIDOL (ISOVUE-300) INJECTION 61% COMPARISON:  CT of the abdomen and pelvis performed 06/03/2010, and abdominal ultrasound performed 03/05/2009 FINDINGS: Lower chest: Minimal bibasilar atelectasis or scarring is noted. The visualized portions of the mediastinum are grossly unremarkable. Hepatobiliary: The liver is unremarkable in appearance. The patient is status post cholecystectomy, with clips noted at the gallbladder fossa. The common bile duct remains normal in caliber. Pancreas: The pancreas is within normal limits. Spleen: The spleen is unremarkable in appearance. Adrenals/Urinary Tract: The adrenal glands are unremarkable in appearance. The kidneys are within normal limits. There is no evidence of hydronephrosis. No renal or ureteral stones are identified. No perinephric stranding is seen. Stomach/Bowel: The stomach is unremarkable in appearance. The small bowel is within  normal limits. The patient is status post appendectomy. Diverticulosis is noted along the descending and sigmoid colon. There is diffuse wall thickening at the mid sigmoid colon, with inflammatory change about diverticula, compatible with acute diverticulitis. There is no definite evidence of perforation or abscess formation at this time. Vascular/Lymphatic: The abdominal aorta is unremarkable in appearance. The inferior vena cava is grossly unremarkable. No retroperitoneal lymphadenopathy is seen. No pelvic sidewall lymphadenopathy is identified. Reproductive: The bladder is mildly distended and grossly unremarkable. The uterus is grossly unremarkable in appearance. No suspicious adnexal masses are seen. The ovaries are relatively symmetric. Other: A small to moderate umbilical hernia is noted, containing only fat. A right lower quadrant anterior abdominal wall mesh is grossly unremarkable in appearance. Musculoskeletal: No acute osseous abnormalities are identified. There is grade 1 retrolisthesis of L2 on L3 and of L3 on L4, with underlying vacuum phenomenon. The patient's right hip arthroplasty is grossly unremarkable in appearance, with 2 screws extending minimally into the soft tissues. There is chronic atrophy of the left iliopsoas musculature. IMPRESSION: 1. Diffuse wall thickening at the mid sigmoid colon, with inflammatory change about diverticula, compatible with acute diverticulitis. No evidence of perforation or abscess formation at this time. 2. Diverticulosis along the descending and sigmoid colon. 3. Small to moderate umbilical hernia, containing only fat. 4. Mild degenerative change along the lumbar spine. Electronically Signed   By: Garald Balding M.D.   On: 03/30/2016 23:49    Procedures Procedures (including critical care time)  Medications Ordered in ED Medications  HYDROcodone-acetaminophen (NORCO/VICODIN) 5-325 MG per tablet 2 tablet (2 tablets Oral Given 03/30/16 2150)  iopamidol  (ISOVUE-300) 61 % injection 100 mL (100 mLs Intravenous Contrast Given 03/30/16 2301)     Initial Impression / Assessment and Plan / ED Course  I have reviewed the triage vital signs and the nursing notes.  Pertinent labs & imaging results that were available during my care of the patient were reviewed by me and considered in my medical decision making (see chart for details).  Clinical Course    Pt presents with severe lower abdominal pain and pressure. She is found to have sigmoid diverticulitis on CT exam without perforation or abscess. Pain is controlled with percocet. No nausea. She is drinking without difficulty.  She is allergic to Flagyl. Cipro started for infection. She is felt to be stable for outpatient treatment of her diverticulitis and close PCP follow up.  Final Clinical Impressions(s) / ED Diagnoses   Final diagnoses:  None  1. Sigmoid diverticulitis  New Prescriptions New Prescriptions   No medications on file    I personally performed the services described in this documentation, which was scribed in my presence. The recorded information has been reviewed and is accurate.      Charlann Lange, PA-C 03/31/16 0127    Drenda Freeze, MD 03/31/16 1130

## 2016-03-31 MED ORDER — HYDROCODONE-ACETAMINOPHEN 5-325 MG PO TABS: 1.0000 | ORAL_TABLET | ORAL | 0 refills | Status: DC | PRN

## 2016-03-31 MED ORDER — CIPROFLOXACIN HCL 500 MG PO TABS
500.0000 mg | ORAL_TABLET | Freq: Once | ORAL | Status: AC
  Administered 2016-03-31: 500 mg via ORAL
  Filled 2016-03-31: qty 1

## 2016-03-31 MED ORDER — CIPROFLOXACIN HCL 500 MG PO TABS: 500.0000 mg | ORAL_TABLET | Freq: Two times a day (BID) | ORAL | 0 refills | Status: DC

## 2017-09-25 ENCOUNTER — Emergency Department (HOSPITAL_COMMUNITY): Payer: Medicare Other

## 2017-09-25 ENCOUNTER — Encounter (HOSPITAL_COMMUNITY): Payer: Self-pay | Admitting: *Deleted

## 2017-09-25 ENCOUNTER — Emergency Department (HOSPITAL_COMMUNITY)
Admission: EM | Admit: 2017-09-25 | Discharge: 2017-09-25 | Disposition: A | Discharge status: Home / Self Care | Payer: Medicare Other | Attending: Emergency Medicine | Admitting: Emergency Medicine

## 2017-09-25 ENCOUNTER — Other Ambulatory Visit: Payer: Self-pay

## 2017-09-25 DIAGNOSIS — J301 Allergic rhinitis due to pollen: Secondary | ICD-10-CM | POA: Insufficient documentation

## 2017-09-25 DIAGNOSIS — R05 Cough: Secondary | ICD-10-CM | POA: Diagnosis present

## 2017-09-25 DIAGNOSIS — Z7982 Long term (current) use of aspirin: Secondary | ICD-10-CM | POA: Diagnosis not present

## 2017-09-25 DIAGNOSIS — Z79899 Other long term (current) drug therapy: Secondary | ICD-10-CM | POA: Diagnosis not present

## 2017-09-25 DIAGNOSIS — R059 Cough, unspecified: Secondary | ICD-10-CM

## 2017-09-25 DIAGNOSIS — G14 Postpolio syndrome: Secondary | ICD-10-CM | POA: Diagnosis not present

## 2017-09-25 MED ORDER — LORATADINE 10 MG PO TABS: 10.0000 mg | ORAL_TABLET | Freq: Every day | ORAL | 0 refills | Status: DC

## 2017-09-25 MED ORDER — OXYMETAZOLINE HCL 0.05 % NA SOLN
1.0000 | Freq: Once | NASAL | Status: AC
  Administered 2017-09-25: 1 via NASAL
  Filled 2017-09-25: qty 15

## 2017-09-25 MED ORDER — BENZONATATE 100 MG PO CAPS: 100.0000 mg | ORAL_CAPSULE | Freq: Three times a day (TID) | ORAL | 0 refills | Status: DC

## 2017-09-25 MED ORDER — OXYMETAZOLINE HCL 0.05 % NA SOLN: 1.0000 | Freq: Two times a day (BID) | NASAL | 0 refills | Status: DC

## 2017-09-25 MED ORDER — SALINE SPRAY 0.65 % NA SOLN: 1.0000 | NASAL | 0 refills | Status: DC | PRN

## 2017-09-25 MED ORDER — ALBUTEROL SULFATE (2.5 MG/3ML) 0.083% IN NEBU
5.0000 mg | INHALATION_SOLUTION | Freq: Once | RESPIRATORY_TRACT | Status: AC
  Administered 2017-09-25: 5 mg via RESPIRATORY_TRACT
  Filled 2017-09-25: qty 6

## 2017-09-25 MED ORDER — BENZONATATE 100 MG PO CAPS
100.0000 mg | ORAL_CAPSULE | Freq: Once | ORAL | Status: AC
  Administered 2017-09-25: 100 mg via ORAL
  Filled 2017-09-25: qty 1

## 2017-09-25 NOTE — ED Notes (Signed)
Patient transported to X-ray 

## 2017-09-25 NOTE — ED Triage Notes (Signed)
Pt stated "I've had a cough has been going on since Thursday.  My doctor told me to use Flonase and Sudafed but it's not helping.  I can't wear my CPAP because of it.  I cough up white."

## 2017-09-25 NOTE — Discharge Instructions (Addendum)
Limit Afrin use to 3 days.  Add nasal saline and Tessalon Perles.  Start Claritin.  Follow-up with PCP if not improving.

## 2017-09-25 NOTE — ED Provider Notes (Signed)
[facility] DEPT Provider Note   CSN: [id number] Arrival date & time: 09/25/17  0035     History   Chief Complaint Chief Complaint  Patient presents with  . Cough    HPI [name] is a 62 y.o. female.  HPI  This is a 62 year old female with a history of post polio syndrome, seasonal allergies, hyperlipidemia who presents with nasal congestion, cough, shortness of breath.  Patient reports onset of symptoms earlier this week.  She has been taking Sudafed and Flonase.  She thought she was doing somewhat better.  However over the last 12 hours she reports "I have not been able to breathe out of my nose."  This is prohibiting her from using her BiPAP.  She states she was unable to get any deep breaths tonight while sleeping because she was unable to use her BiPAP.  She reports cough productive of white sputum.  Denies any recent fevers.  Denies any chest pain.. She reports sinus pressure and ear pain bilaterally.  She states "I just was unable to sleep."  Denies leg swelling or history of PEs.  Patient relates her symptoms to seasonal allergies.  Past Medical History:  Diagnosis Date  . Anxiety   . Arthritis    low back  . Cervical pain (neck)   . Connective tissue disease ([place])    questionable Lupus (she has yet to see a rheumatologist)  . Depression   . Diverticulitis   . GERD (gastroesophageal reflux disease)   . History of fracture of left hip   . Hyperlipidemia   . Insomnia   . Peripheral edema   . Post-polio syndrome    With left leg paralysis  . Post-viral reactive airway disease   . Postmenopausal   . Sigmoid diverticulosis     Patient Active Problem List   Diagnosis Date Noted  . Edema 11/09/2013  . Obesity (BMI 35.0-39.9 without comorbidity) 04/12/2013    Past Surgical History:  Procedure Laterality Date  . ANKLE FUSION     left  . APPENDECTOMY    . CHOLECYSTECTOMY    . KNEE ARTHROPLASTY    . LEG SURGERY     muscle  surgery related to polio  . TOTAL HIP ARTHROPLASTY     right x 3  . UMBILICAL HERNIA REPAIR       OB History   None      Home Medications    Prior to Admission medications   Medication Sig Start Date End Date Taking? Authorizing Provider  ALPRAZolam Duanne Moron) 0.5 MG tablet Take 0.5 mg by mouth 2 (two) times daily as needed for sleep.    Yes [provider]  aspirin 81 MG tablet Take 81 mg by mouth daily.   Yes [provider]  Cholecalciferol (D3 HIGH POTENCY) 1000 UNITS capsule Take 1,000 Units by mouth daily.   Yes [provider]  docusate sodium (COLACE) 100 MG capsule Take 100 mg by mouth daily.   Yes [provider]  pantoprazole (PROTONIX) 40 MG tablet Take 40 mg by mouth daily.   Yes [provider]  traZODone (DESYREL) 100 MG tablet Take 100 mg by mouth at bedtime.    Yes [provider]  venlafaxine XR (EFFEXOR-XR) 75 MG 24 hr capsule Take 75 mg by mouth daily with breakfast.   Yes [provider]  vitamin B-12 (CYANOCOBALAMIN) 250 MCG tablet Take 250 mcg by mouth daily.   Yes [provider]  benzonatate (TESSALON)  100 MG capsule Take 1 capsule (100 mg total) by mouth every 8 (eight) hours. 09/25/17   Horton, Barbette Hair, MD  loratadine (CLARITIN) 10 MG tablet Take 1 tablet (10 mg total) by mouth daily. 09/25/17   Horton, Barbette Hair, MD  oxymetazoline (AFRIN NASAL SPRAY) 0.05 % nasal spray Place 1 spray into both nostrils 2 (two) times daily. 09/25/17   Horton, Barbette Hair, MD  sodium chloride (OCEAN) 0.65 % SOLN nasal spray Place 1 spray into both nostrils as needed for congestion. 09/25/17   Horton, Barbette Hair, MD    Family History Family History  Problem Relation Age of Onset  . Cancer Mother   . Heart disease Father   . CAD Other        FAM Hx OF CAD  . Diabetes Other        FAM Hx of DM    Social History Social History   Tobacco Use  . Smoking status: Never Smoker  . Smokeless tobacco: Never  Used  Substance Use Topics  . Alcohol use: No  . Drug use: Not on file     Allergies   Flagyl [metronidazole]; Amoxicillin; Other; Percocet [oxycodone-acetaminophen]; Plaquenil [hydroxychloroquine sulfate]; and Sulfa antibiotics   Review of Systems Review of Systems  Constitutional: Negative for fever.  HENT: Positive for congestion and ear pain. Negative for sore throat.   Respiratory: Positive for cough and shortness of breath.   Cardiovascular: Negative for chest pain.  Gastrointestinal: Negative for abdominal pain, nausea and vomiting.  Genitourinary: Negative for dysuria.  All other systems reviewed and are negative.    Physical Exam Updated Vital Signs BP 125/68 (BP Location: Right Arm)   Pulse 84   Temp 98 F (36.7 C) (Oral)   Resp 17   Ht 5\' 8"  (1.727 m)   Wt 113.4 kg (250 lb)   SpO2 95%   BMI 38.01 kg/m   Physical Exam  Constitutional: She is oriented to person, place, and time.  Chronically ill-appearing, obese, no acute distress  HENT:  Head: Normocephalic and atraumatic.  Mouth/Throat: Oropharynx is clear and moist.  Fluid behind bilateral TMs, no significant erythema or bulging, nasal turbulence swollen  Eyes: Pupils are equal, round, and reactive to light.  Cardiovascular: Normal rate, regular rhythm and normal heart sounds.  Pulmonary/Chest: Effort normal and breath sounds normal. No respiratory distress. She has no wheezes.  Abdominal: Soft. There is no tenderness.  Musculoskeletal: She exhibits no edema or tenderness.  Neurological: She is alert and oriented to person, place, and time.  Skin: Skin is warm and dry.  Psychiatric: She has a normal mood and affect.  Nursing note and vitals reviewed.    ED Treatments / Results  Labs (all labs ordered are listed, but only abnormal results are displayed) Labs Reviewed - No data to display  EKG None  Radiology Dg Chest 2 View  Result Date: 09/25/2017 CLINICAL DATA:  Productive cough and  dyspnea EXAM: CHEST - 2 VIEW COMPARISON:  03/04/2013 FINDINGS: The heart size and mediastinal contours are within normal limits. Mild central vascular congestion without acute pulmonary consolidation, effusion or pneumothorax. Degenerative changes are present throughout the thoracic spine. IMPRESSION: No active cardiopulmonary disease. Mild central vascular congestion without acute pneumonic consolidation. Electronically Signed   By: Ashley Royalty M.D.   On: 09/25/2017 01:58    Procedures Procedures (including critical care time)  Medications Ordered in ED Medications  albuterol (PROVENTIL) (2.5 MG/3ML) 0.083% nebulizer solution 5 mg (5 mg Nebulization Given  09/25/17 0218)  benzonatate (TESSALON) capsule 100 mg (100 mg Oral Given 09/25/17 0259)  oxymetazoline (AFRIN) 0.05 % nasal spray 1 spray (1 spray Each Nare Given 09/25/17 0300)     Initial Impression / Assessment and Plan / ED Course  I have reviewed the triage vital signs and the nursing notes.  Pertinent labs & imaging results that were available during my care of the patient were reviewed by me and considered in my medical decision making (see chart for details).    Patient presents with upper respiratory symptoms.  She is overall nontoxic appearing and vital signs are reassuring.  Pulse ox is 95%.  She is in no acute distress.  She has upper respiratory symptoms.  Likely viral versus allergic.  Chest x-ray shows no evidence of pneumonia.  Given absence of fever and clear pulmonary exam, would not elect to treat with antibiotics at this time.  EKG shows no evidence of ischemia.  We will add Tessalon Perles, Afrin and daily Claritin to the patient's regimen.  Follow-up with primary physician.  Doubt pneumonia, pneumothorax, ACS.  After history, exam, and medical workup I feel the patient has been appropriately medically screened and is safe for discharge home. Pertinent diagnoses were discussed with the patient. Patient was given return  precautions.    Final Clinical Impressions(s) / ED Diagnoses   Final diagnoses:  Seasonal allergic rhinitis due to pollen  Cough    ED Discharge Orders        Ordered    oxymetazoline (AFRIN NASAL SPRAY) 0.05 % nasal spray  2 times daily     09/25/17 0349    sodium chloride (OCEAN) 0.65 % SOLN nasal spray  As needed     09/25/17 0349    benzonatate (TESSALON) 100 MG capsule  Every 8 hours     09/25/17 0349    loratadine (CLARITIN) 10 MG tablet  Daily     09/25/17 0350       Merryl Hacker, MD 09/25/17 [phone]

## 2017-12-30 DIAGNOSIS — M25551 Pain in right hip: Secondary | ICD-10-CM | POA: Insufficient documentation

## 2018-01-12 ENCOUNTER — Encounter: Payer: Medicare Other | Attending: Family Medicine | Admitting: Registered"

## 2018-01-12 ENCOUNTER — Encounter: Payer: Self-pay | Admitting: Registered"

## 2018-01-12 DIAGNOSIS — E669 Obesity, unspecified: Secondary | ICD-10-CM | POA: Insufficient documentation

## 2018-01-12 DIAGNOSIS — Z713 Dietary counseling and surveillance: Secondary | ICD-10-CM | POA: Insufficient documentation

## 2018-01-12 NOTE — Progress Notes (Signed)
Medical Nutrition Therapy:  Appt start time: 10:00 end time:  11:10.  Prefers to be called [name].   Assessment:  Primary concerns today: Pt states she had polio as a child and began experiencing complications again about 13 years ago. Pt states she has had 3 hip replacements, is paralyzed from knee down, has a pulled hamstring, and arthritis in lower back. Pt states she has strong arms, but cannot do any physical activity. Pt states she has been running out of energy.   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 Pt states she lives alone. Pt states she is strong-willed and has supportive family and friends.   Pt states her recent A1c was 5.7, glucose 130; does not have diabetes, hypertension, or high cholesterol.   Pt states her physical therapist wants her to lose 2 lbs/month. Pt states she sleeps 9pm-4am a night but sometimes skips breakfast on days when she does not have to get up early.   Pt states she likes some vegetables but not a big vegetable person. Pt states she likes green breans, corn, lima beans, squash, pinto beans, cabbage, broccoli, carrots, tomatoes, and cucumbers. Pt states she adds a little bacon grease to vegetables. Pt states she also likes potato salad and chicken salad with lite mayo, egg, pickle chips, celery, onion, and splenda.  Pt is hard on herself and thinks she is being lazy with food. Pt reports preparing all of her meals and sometimes she gets tired. Pt uses "good", "bad", and "terrible" when referring to food choices. Pt says that seeing obese/overweight on medical paperwork makes her feel like something is wrong with her. Pt states she does not want to be on a diet and have more things taken away from her that she enjoys.    Preferred Learning Style:   No preference indicated   Learning  Readiness:   Ready  Change in progress   MEDICATIONS: See list   DIETARY INTAKE:  Usual eating pattern includes 2-3 meals and 1-2 snacks per day.  Everyday foods include sandwiches, chips, chicken, cereal, and fruit.  Avoided foods include none.    24-hr recall:  B ( AM): sometimes skips or breakfast muffins (sausage, cheese, eggs, bread) or raisin bran or fruit Snk ( AM): none  L ( PM): raisin bran or leftovers or sandwich + chips Snk ( PM): none D ( PM): panko chicken  Snk ( PM): peanut m&m's or cookies Beverages: skim milk, decaf tea with sweet and low, diet soda (sometimes), water (~20 oz), juice   Usual physical activity: unable to exercise due to polio post-  Estimated energy needs: 1500-1600 calories 170-180 g carbohydrates 112-120 g protein 42-44 g fat  Progress Towards Goal(s):  In progress.   Nutritional Diagnosis:  NB-1.1 Food and nutrition-related knowledge deficit As related to lack of prior nutrition-related information.  As evidenced by pt verbalizes inaccurate information.    Intervention:  Nutrition education and counseling. Pt was educated and counseled on the importance of consistently eating 3 meals a day, well-balanced meals, MyPlate, eating to nourish her body, increasing fluid intake, and positive self-talk. Pt was in agreement with goals listed.  Goals: - Increase water intake by adding a 3rd water bottle.  - Aim to eat 3 meals a day consistently.  - Aim for well-balanced meals. See handouts.  - Practice  positive affirmations daily.   Teaching Method Utilized:  Visual Auditory Hands on  Handouts given during visit include:  MyPlate  Meal Plan Ideas  Barriers to learning/adherence to lifestyle change: limited physical abilities but pt is very motivated to make nutritional changes  Demonstrated degree of understanding via:  Teach Back   Monitoring/Evaluation:  Dietary intake, exercise, and body weight prn.

## 2018-01-12 NOTE — Patient Instructions (Signed)
-   Increase water intake by adding a 3rd water bottle.   - Aim to eat 3 meals a day consistently.   - Aim for well-balanced meals. See handouts.   - Practice positive affirmations daily.

## 2018-06-14 DIAGNOSIS — R011 Cardiac murmur, unspecified: Secondary | ICD-10-CM

## 2018-06-14 HISTORY — PX: RECTAL PROLAPSE REPAIR: SHX759

## 2018-06-14 HISTORY — PX: EYE SURGERY: SHX253

## 2018-06-14 HISTORY — DX: Cardiac murmur, unspecified: R01.1

## 2018-08-15 DIAGNOSIS — G5622 Lesion of ulnar nerve, left upper limb: Secondary | ICD-10-CM | POA: Insufficient documentation

## 2018-08-15 DIAGNOSIS — M25522 Pain in left elbow: Secondary | ICD-10-CM | POA: Insufficient documentation

## 2018-08-15 DIAGNOSIS — M79672 Pain in left foot: Secondary | ICD-10-CM | POA: Insufficient documentation

## 2019-02-18 ENCOUNTER — Emergency Department (HOSPITAL_BASED_OUTPATIENT_CLINIC_OR_DEPARTMENT_OTHER)
Admission: EM | Admit: 2019-02-18 | Discharge: 2019-02-18 | Disposition: A | Discharge status: Home / Self Care | Payer: Medicare Other | Attending: Emergency Medicine | Admitting: Emergency Medicine

## 2019-02-18 ENCOUNTER — Encounter (HOSPITAL_BASED_OUTPATIENT_CLINIC_OR_DEPARTMENT_OTHER): Payer: Self-pay | Admitting: Emergency Medicine

## 2019-02-18 ENCOUNTER — Other Ambulatory Visit: Payer: Self-pay

## 2019-02-18 DIAGNOSIS — Z888 Allergy status to other drugs, medicaments and biological substances status: Secondary | ICD-10-CM | POA: Diagnosis not present

## 2019-02-18 DIAGNOSIS — Z880 Allergy status to penicillin: Secondary | ICD-10-CM | POA: Insufficient documentation

## 2019-02-18 DIAGNOSIS — Z79899 Other long term (current) drug therapy: Secondary | ICD-10-CM | POA: Diagnosis not present

## 2019-02-18 DIAGNOSIS — N899 Noninflammatory disorder of vagina, unspecified: Secondary | ICD-10-CM | POA: Diagnosis present

## 2019-02-18 DIAGNOSIS — G8194 Hemiplegia, unspecified affecting left nondominant side: Secondary | ICD-10-CM | POA: Insufficient documentation

## 2019-02-18 DIAGNOSIS — Z7982 Long term (current) use of aspirin: Secondary | ICD-10-CM | POA: Insufficient documentation

## 2019-02-18 DIAGNOSIS — G14 Postpolio syndrome: Secondary | ICD-10-CM | POA: Insufficient documentation

## 2019-02-18 DIAGNOSIS — Z882 Allergy status to sulfonamides status: Secondary | ICD-10-CM | POA: Insufficient documentation

## 2019-02-18 DIAGNOSIS — N813 Complete uterovaginal prolapse: Secondary | ICD-10-CM | POA: Insufficient documentation

## 2019-02-18 DIAGNOSIS — Z885 Allergy status to narcotic agent status: Secondary | ICD-10-CM | POA: Diagnosis not present

## 2019-02-18 DIAGNOSIS — E785 Hyperlipidemia, unspecified: Secondary | ICD-10-CM | POA: Insufficient documentation

## 2019-02-18 DIAGNOSIS — N8110 Cystocele, unspecified: Secondary | ICD-10-CM

## 2019-02-18 LAB — WET PREP, GENITAL
Clue Cells Wet Prep HPF POC: NONE SEEN
Sperm: NONE SEEN
Trich, Wet Prep: NONE SEEN
Yeast Wet Prep HPF POC: NONE SEEN

## 2019-02-18 NOTE — ED Triage Notes (Signed)
Pt reports vaginal pressure x 2 days. Pt sent from [facility]. UA was neg. Denies discharge.

## 2019-02-18 NOTE — Discharge Instructions (Signed)
You can take ibuprofen and Tylenol as needed for your pain.  Please follow-up with your OB/GYN for further evaluation and treatment of your suspected bladder prolapse.  Please return to the emergency department if you develop any new or worsening symptoms.

## 2019-02-18 NOTE — ED Provider Notes (Signed)
[facility] EMERGENCY DEPARTMENT Provider Note   CSN: [id number] Arrival date & time: 02/18/19  1206     History   Chief Complaint Chief Complaint  Patient presents with  . Vaginal Pain    HPI [name] is a 63 y.o. female with history of post polio syndrome with left leg hemiparesis, GERD, diverticulosis who presents with a 2-day history of vaginal pressure.  It is improved when she urinates.  She is having some pressure in urgency.  She denies any vaginal discharge.  She is not sexually active.  She was evaluated at [facility] and sent for further evaluation, as they could not do a pelvic exam in office.  Patient denies any fevers, chest pain, shortness of breath, abdominal pain, nausea, vomiting.  Patient reports she has had a headache because she had a cataract surgery last week and is supposed to have the other side done this week and her vision is uneven.     HPI  Past Medical History:  Diagnosis Date  . Anxiety   . Arthritis    low back  . Cervical pain (neck)   . Connective tissue disease ([place])    questionable Lupus (she has yet to see a rheumatologist)  . Depression   . Diverticulitis   . GERD (gastroesophageal reflux disease)   . History of fracture of left hip   . Hyperlipidemia   . Insomnia   . Peripheral edema   . Post-polio syndrome    With left leg paralysis  . Post-viral reactive airway disease   . Postmenopausal   . Sigmoid diverticulosis     Patient Active Problem List   Diagnosis Date Noted  . Edema 11/09/2013  . Obesity (BMI 35.0-39.9 without comorbidity) 04/12/2013    Past Surgical History:  Procedure Laterality Date  . ANKLE FUSION     left  . APPENDECTOMY    . CHOLECYSTECTOMY    . KNEE ARTHROPLASTY    . LEG SURGERY     muscle surgery related to polio  . TOTAL HIP ARTHROPLASTY     right x 3  . UMBILICAL HERNIA REPAIR       OB History   No obstetric history on file.      Home Medications    Prior to Admission  medications   Medication Sig Start Date End Date Taking? Authorizing Provider  ALPRAZolam Duanne Moron) 0.5 MG tablet Take 0.5 mg by mouth 2 (two) times daily as needed for sleep.     [provider]  aspirin 81 MG tablet Take 81 mg by mouth daily.    [provider]  benzonatate (TESSALON) 100 MG capsule Take 1 capsule (100 mg total) by mouth every 8 (eight) hours. 09/25/17   Horton, Barbette Hair, MD  Cholecalciferol (D3 HIGH POTENCY) 1000 UNITS capsule Take 1,000 Units by mouth daily.    [provider]  docusate sodium (COLACE) 100 MG capsule Take 100 mg by mouth daily.    [provider]  loratadine (CLARITIN) 10 MG tablet Take 1 tablet (10 mg total) by mouth daily. 09/25/17   Horton, Barbette Hair, MD  meloxicam (MOBIC) 7.5 MG tablet Take 7.5 mg by mouth daily.    [provider]  oxymetazoline (AFRIN NASAL SPRAY) 0.05 % nasal spray Place 1 spray into both nostrils 2 (two) times daily. 09/25/17   Horton, Barbette Hair, MD  pantoprazole (PROTONIX) 40 MG tablet Take 40 mg by mouth daily.    [provider]  sodium chloride (  OCEAN) 0.65 % SOLN nasal spray Place 1 spray into both nostrils as needed for congestion. 09/25/17   Horton, Barbette Hair, MD  traZODone (DESYREL) 100 MG tablet Take 100 mg by mouth at bedtime.     [provider]  venlafaxine XR (EFFEXOR-XR) 75 MG 24 hr capsule Take 75 mg by mouth daily with breakfast.    [provider]  vitamin B-12 (CYANOCOBALAMIN) 250 MCG tablet Take 250 mcg by mouth daily.    [provider]    Family History Family History  Problem Relation Age of Onset  . Cancer Mother   . Heart disease Father   . CAD Other        FAM Hx OF CAD  . Diabetes Other        FAM Hx of DM  . Hypertension Other     Social History Social History   Tobacco Use  . Smoking status: Never Smoker  . Smokeless tobacco: Never Used  Substance Use Topics  . Alcohol use: No  . Drug use: Not on file      Allergies   Flagyl [metronidazole], Amoxicillin, Other, Percocet [oxycodone-acetaminophen], Plaquenil [hydroxychloroquine sulfate], and Sulfa antibiotics   Review of Systems Review of Systems  Constitutional: Negative for chills and fever.  HENT: Negative for facial swelling and sore throat.   Respiratory: Negative for shortness of breath.   Cardiovascular: Negative for chest pain.  Gastrointestinal: Negative for abdominal pain, nausea and vomiting.  Genitourinary: Negative for dysuria.  Musculoskeletal: Negative for back pain.  Skin: Negative for rash and wound.  Neurological: Positive for headaches.  Psychiatric/Behavioral: The patient is not nervous/anxious.      Physical Exam Updated Vital Signs BP 129/68 (BP Location: Right Arm)   Pulse 75   Temp 99 F (37.2 C) (Oral)   Resp 16   Ht 5\' 8"  (1.727 m)   Wt 113.4 kg   SpO2 100%   BMI 38.01 kg/m   Physical Exam Vitals signs and nursing note reviewed. Exam conducted with a chaperone present.  Constitutional:      General: She is not in acute distress.    Appearance: She is well-developed. She is not diaphoretic.  HENT:     Head: Normocephalic and atraumatic.     Mouth/Throat:     Pharynx: No oropharyngeal exudate.  Eyes:     General: No scleral icterus.       Right eye: No discharge.        Left eye: No discharge.     Conjunctiva/sclera: Conjunctivae normal.     Pupils: Pupils are equal, round, and reactive to light.  Neck:     Musculoskeletal: Normal range of motion and neck supple.     Thyroid: No thyromegaly.  Cardiovascular:     Rate and Rhythm: Normal rate and regular rhythm.     Heart sounds: Normal heart sounds. No murmur. No friction rub. No gallop.   Pulmonary:     Effort: Pulmonary effort is normal. No respiratory distress.     Breath sounds: Normal breath sounds. No stridor. No wheezing or rales.  Abdominal:     General: Bowel sounds are normal. There is no distension.     Palpations: Abdomen is  soft.     Tenderness: There is no abdominal tenderness. There is no guarding or rebound.  Genitourinary:    Comments: Patient has what appears to be a stage I cystocele; patient cannot tolerate a full speculum exam and I could not visualize cervix; bimanual  exam shows tenderness anteriorly and a fullness; small amount of white discharge, probably physiologic Musculoskeletal:     Comments: Left leg paralysis  Lymphadenopathy:     Cervical: No cervical adenopathy.  Skin:    General: Skin is warm and dry.     Coloration: Skin is not pale.     Findings: No rash.  Neurological:     Mental Status: She is alert.     Coordination: Coordination normal.      ED Treatments / Results  Labs (all labs ordered are listed, but only abnormal results are displayed) Labs Reviewed  WET PREP, GENITAL - Abnormal; Notable for the following components:      Result Value   WBC, Wet Prep HPF POC FEW (*)    All other components within normal limits    EKG None  Radiology No results found.  Procedures Procedures (including critical care time)  Medications Ordered in ED Medications - No data to display   Initial Impression / Assessment and Plan / ED Course  I have reviewed the triage vital signs and the nursing notes.  Pertinent labs & imaging results that were available during my care of the patient were reviewed by me and considered in my medical decision making (see chart for details).        Patient with pelvic organ prolapse, suspect stage I cystocele.  No emergent indications for intervention today, however will refer to patient's OB/GYN for further evaluation.  UA at primary care was negative.  Wet prep is negative here.  Patient advised to take Tylenol and Motrin for pain, as needed.  She is also advised to try to keep her bladder is empty as possible, as this will help with the pressure.  Return precautions discussed.  Patient understands and agrees with plan.  Patient vital stable  throughout ED course and discharged in satisfactory condition.  Patient also evaluated by my attending, Dr. Roslynn Amble, who guided the patient's management and agrees with plan.  Final Clinical Impressions(s) / ED Diagnoses   Final diagnoses:  Pelvic organ prolapse quantification stage 1 cystocele    ED Discharge Orders    None       Frederica Kuster, PA-C 02/18/19 1530    Lucrezia Starch, MD 02/19/19 [phone]

## 2019-03-30 ENCOUNTER — Emergency Department (HOSPITAL_COMMUNITY): Payer: Medicare Other

## 2019-03-30 ENCOUNTER — Inpatient Hospital Stay (HOSPITAL_COMMUNITY)
Admission: EM | Admit: 2019-03-30 | Discharge: 2019-04-03 | DRG: 280 | Disposition: A | Discharge status: Home / Self Care | Payer: Medicare Other | Attending: Family Medicine | Admitting: Family Medicine

## 2019-03-30 DIAGNOSIS — Z881 Allergy status to other antibiotic agents status: Secondary | ICD-10-CM

## 2019-03-30 DIAGNOSIS — Z6841 Body Mass Index (BMI) 40.0 and over, adult: Secondary | ICD-10-CM

## 2019-03-30 DIAGNOSIS — I5043 Acute on chronic combined systolic (congestive) and diastolic (congestive) heart failure: Secondary | ICD-10-CM | POA: Diagnosis not present

## 2019-03-30 DIAGNOSIS — E669 Obesity, unspecified: Secondary | ICD-10-CM | POA: Diagnosis present

## 2019-03-30 DIAGNOSIS — Z888 Allergy status to other drugs, medicaments and biological substances status: Secondary | ICD-10-CM

## 2019-03-30 DIAGNOSIS — G8314 Monoplegia of lower limb affecting left nondominant side: Secondary | ICD-10-CM | POA: Diagnosis present

## 2019-03-30 DIAGNOSIS — F418 Other specified anxiety disorders: Secondary | ICD-10-CM | POA: Diagnosis present

## 2019-03-30 DIAGNOSIS — M47816 Spondylosis without myelopathy or radiculopathy, lumbar region: Secondary | ICD-10-CM | POA: Diagnosis present

## 2019-03-30 DIAGNOSIS — Z20828 Contact with and (suspected) exposure to other viral communicable diseases: Secondary | ICD-10-CM | POA: Diagnosis present

## 2019-03-30 DIAGNOSIS — G14 Postpolio syndrome: Secondary | ICD-10-CM | POA: Diagnosis present

## 2019-03-30 DIAGNOSIS — Z885 Allergy status to narcotic agent status: Secondary | ICD-10-CM

## 2019-03-30 DIAGNOSIS — R072 Precordial pain: Secondary | ICD-10-CM

## 2019-03-30 DIAGNOSIS — G4700 Insomnia, unspecified: Secondary | ICD-10-CM | POA: Diagnosis present

## 2019-03-30 DIAGNOSIS — Z96641 Presence of right artificial hip joint: Secondary | ICD-10-CM | POA: Diagnosis present

## 2019-03-30 DIAGNOSIS — I214 Non-ST elevation (NSTEMI) myocardial infarction: Secondary | ICD-10-CM | POA: Diagnosis not present

## 2019-03-30 DIAGNOSIS — K5730 Diverticulosis of large intestine without perforation or abscess without bleeding: Secondary | ICD-10-CM | POA: Diagnosis present

## 2019-03-30 DIAGNOSIS — Z8249 Family history of ischemic heart disease and other diseases of the circulatory system: Secondary | ICD-10-CM

## 2019-03-30 DIAGNOSIS — R778 Other specified abnormalities of plasma proteins: Secondary | ICD-10-CM

## 2019-03-30 DIAGNOSIS — Z96659 Presence of unspecified artificial knee joint: Secondary | ICD-10-CM | POA: Diagnosis present

## 2019-03-30 DIAGNOSIS — I200 Unstable angina: Secondary | ICD-10-CM

## 2019-03-30 DIAGNOSIS — E785 Hyperlipidemia, unspecified: Secondary | ICD-10-CM | POA: Diagnosis present

## 2019-03-30 DIAGNOSIS — K219 Gastro-esophageal reflux disease without esophagitis: Secondary | ICD-10-CM | POA: Diagnosis present

## 2019-03-30 DIAGNOSIS — Z809 Family history of malignant neoplasm, unspecified: Secondary | ICD-10-CM

## 2019-03-30 DIAGNOSIS — E782 Mixed hyperlipidemia: Secondary | ICD-10-CM | POA: Diagnosis present

## 2019-03-30 DIAGNOSIS — Z981 Arthrodesis status: Secondary | ICD-10-CM

## 2019-03-30 DIAGNOSIS — R079 Chest pain, unspecified: Secondary | ICD-10-CM | POA: Diagnosis present

## 2019-03-30 DIAGNOSIS — I350 Nonrheumatic aortic (valve) stenosis: Secondary | ICD-10-CM

## 2019-03-30 DIAGNOSIS — Z993 Dependence on wheelchair: Secondary | ICD-10-CM

## 2019-03-30 DIAGNOSIS — Z882 Allergy status to sulfonamides status: Secondary | ICD-10-CM

## 2019-03-30 HISTORY — DX: Morbid (severe) obesity due to excess calories: E66.01

## 2019-03-30 HISTORY — DX: Obstructive sleep apnea (adult) (pediatric): G47.33

## 2019-03-30 HISTORY — DX: Dependence on other enabling machines and devices: Z99.89

## 2019-03-30 HISTORY — DX: Nonrheumatic aortic (valve) stenosis: I35.0

## 2019-03-30 LAB — BASIC METABOLIC PANEL
Anion gap: 10 (ref 5–15)
BUN: 13 mg/dL (ref 8–23)
CO2: 25 mmol/L (ref 22–32)
Calcium: 9.1 mg/dL (ref 8.9–10.3)
Chloride: 98 mmol/L (ref 98–111)
Creatinine, Ser: 0.64 mg/dL (ref 0.44–1.00)
GFR calc Af Amer: >60 mL/min (ref >60)
GFR calc non Af Amer: >60 mL/min (ref >60)
Glucose, Bld: 103 mg/dL — ABNORMAL HIGH (ref 70–99)
Potassium: 4.1 mmol/L (ref 3.5–5.1)
Sodium: 133 mmol/L — ABNORMAL LOW (ref 135–145)

## 2019-03-30 LAB — TROPONIN I (HIGH SENSITIVITY)
Troponin I (High Sensitivity): 41 ng/L — ABNORMAL HIGH (ref <18)
Troponin I (High Sensitivity): 46 ng/L — ABNORMAL HIGH (ref <18)

## 2019-03-30 LAB — CBC
HCT: 44.5 % (ref 36.0–46.0)
Hemoglobin: 14.8 g/dL (ref 12.0–15.0)
MCH: 31.6 pg (ref 26.0–34.0)
MCHC: 33.3 g/dL (ref 30.0–36.0)
MCV: 94.9 fL (ref 80.0–100.0)
Platelets: 296 10*3/uL (ref 150–400)
RBC: 4.69 MIL/uL (ref 3.87–5.11)
RDW: 13.2 % (ref 11.5–15.5)
WBC: 7.7 10*3/uL (ref 4.0–10.5)
nRBC: 0 % (ref 0.0–0.2)

## 2019-03-30 MED ORDER — SODIUM CHLORIDE 0.9% FLUSH: 3.0000 mL | Freq: Once | INTRAVENOUS | Status: DC

## 2019-03-30 NOTE — ED Triage Notes (Signed)
pt states last night at 9pm she had a sudden onset of CP that lasted for about 10-15 minutes. The "bad" pain has resolved but now has a constant ache in the center of her chest and mid back.

## 2019-03-31 ENCOUNTER — Other Ambulatory Visit: Payer: Self-pay

## 2019-03-31 ENCOUNTER — Encounter (HOSPITAL_COMMUNITY): Payer: Self-pay

## 2019-03-31 ENCOUNTER — Emergency Department (HOSPITAL_COMMUNITY): Payer: Medicare Other

## 2019-03-31 ENCOUNTER — Observation Stay (HOSPITAL_BASED_OUTPATIENT_CLINIC_OR_DEPARTMENT_OTHER): Payer: Medicare Other

## 2019-03-31 DIAGNOSIS — I200 Unstable angina: Secondary | ICD-10-CM | POA: Diagnosis not present

## 2019-03-31 DIAGNOSIS — I214 Non-ST elevation (NSTEMI) myocardial infarction: Secondary | ICD-10-CM | POA: Diagnosis present

## 2019-03-31 DIAGNOSIS — K219 Gastro-esophageal reflux disease without esophagitis: Secondary | ICD-10-CM | POA: Diagnosis not present

## 2019-03-31 DIAGNOSIS — E785 Hyperlipidemia, unspecified: Secondary | ICD-10-CM | POA: Diagnosis present

## 2019-03-31 DIAGNOSIS — R778 Other specified abnormalities of plasma proteins: Secondary | ICD-10-CM | POA: Insufficient documentation

## 2019-03-31 DIAGNOSIS — I351 Nonrheumatic aortic (valve) insufficiency: Secondary | ICD-10-CM | POA: Diagnosis not present

## 2019-03-31 DIAGNOSIS — F418 Other specified anxiety disorders: Secondary | ICD-10-CM | POA: Diagnosis present

## 2019-03-31 DIAGNOSIS — R079 Chest pain, unspecified: Secondary | ICD-10-CM | POA: Diagnosis not present

## 2019-03-31 DIAGNOSIS — I350 Nonrheumatic aortic (valve) stenosis: Secondary | ICD-10-CM

## 2019-03-31 LAB — RAPID URINE DRUG SCREEN, HOSP PERFORMED
Amphetamines: NOT DETECTED
Barbiturates: NOT DETECTED
Benzodiazepines: POSITIVE — AB
Cocaine: NOT DETECTED
Opiates: NOT DETECTED
Tetrahydrocannabinol: NOT DETECTED

## 2019-03-31 LAB — ECHOCARDIOGRAM COMPLETE
Height: 68 in
Weight: 4000 oz

## 2019-03-31 LAB — LIPID PANEL
Cholesterol: 201 mg/dL — ABNORMAL HIGH (ref 0–200)
HDL: 44 mg/dL (ref >40)
LDL Cholesterol: 138 mg/dL — ABNORMAL HIGH (ref 0–99)
Total CHOL/HDL Ratio: 4.6 RATIO
Triglycerides: 93 mg/dL (ref <150)
VLDL: 19 mg/dL (ref 0–40)

## 2019-03-31 LAB — HIV ANTIBODY (ROUTINE TESTING W REFLEX): HIV Screen 4th Generation wRfx: NONREACTIVE

## 2019-03-31 LAB — TROPONIN I (HIGH SENSITIVITY)
Troponin I (High Sensitivity): 50 ng/L — ABNORMAL HIGH (ref <18)
Troponin I (High Sensitivity): 51 ng/L — ABNORMAL HIGH (ref <18)
Troponin I (High Sensitivity): 49 ng/L — ABNORMAL HIGH (ref <18)

## 2019-03-31 LAB — HEMOGLOBIN A1C
Hgb A1c MFr Bld: 5.6 % (ref 4.8–5.6)
Mean Plasma Glucose: 114.02 mg/dL

## 2019-03-31 LAB — SARS CORONAVIRUS 2 (TAT 6-24 HRS): SARS Coronavirus 2: NEGATIVE

## 2019-03-31 LAB — HEPARIN LEVEL (UNFRACTIONATED)
Heparin Unfractionated: 0.15 IU/mL — ABNORMAL LOW (ref 0.30–0.70)
Heparin Unfractionated: 0.4 IU/mL (ref 0.30–0.70)

## 2019-03-31 MED ORDER — TRAZODONE HCL 100 MG PO TABS
100.0000 mg | ORAL_TABLET | Freq: Every day | ORAL | Status: DC
  Administered 2019-03-31 – 2019-04-02 (×3): 100 mg via ORAL
  Filled 2019-03-31 (×3): qty 1

## 2019-03-31 MED ORDER — VITAMIN B-12 250 MCG PO TABS: 250.0000 ug | ORAL_TABLET | Freq: Every day | ORAL | Status: DC

## 2019-03-31 MED ORDER — VENLAFAXINE HCL ER 75 MG PO CP24
75.0000 mg | ORAL_CAPSULE | Freq: Every day | ORAL | Status: DC
  Administered 2019-03-31 – 2019-04-03 (×3): 75 mg via ORAL
  Filled 2019-03-31 (×4): qty 1

## 2019-03-31 MED ORDER — DOCUSATE SODIUM 100 MG PO CAPS
100.0000 mg | ORAL_CAPSULE | Freq: Every day | ORAL | Status: DC
  Administered 2019-03-31 – 2019-04-03 (×3): 100 mg via ORAL
  Filled 2019-03-31 (×4): qty 1

## 2019-03-31 MED ORDER — ACETAMINOPHEN 325 MG PO TABS
650.0000 mg | ORAL_TABLET | ORAL | Status: DC | PRN
  Administered 2019-03-31 – 2019-04-03 (×3): 650 mg via ORAL
  Filled 2019-03-31 (×3): qty 2

## 2019-03-31 MED ORDER — FENTANYL CITRATE (PF) 100 MCG/2ML IJ SOLN
50.0000 ug | Freq: Once | INTRAMUSCULAR | Status: AC
  Administered 2019-03-31: 50 ug via INTRAVENOUS
  Filled 2019-03-31: qty 2

## 2019-03-31 MED ORDER — GABAPENTIN 300 MG PO CAPS
300.0000 mg | ORAL_CAPSULE | Freq: Every day | ORAL | Status: DC
  Administered 2019-03-31 – 2019-04-03 (×3): 300 mg via ORAL
  Filled 2019-03-31 (×3): qty 1

## 2019-03-31 MED ORDER — MORPHINE SULFATE (PF) 2 MG/ML IV SOLN: 2.0000 mg | INTRAVENOUS | Status: DC | PRN

## 2019-03-31 MED ORDER — CYANOCOBALAMIN 500 MCG PO TABS
250.0000 ug | ORAL_TABLET | Freq: Every day | ORAL | Status: DC
  Administered 2019-03-31 – 2019-04-01 (×2): 250 ug via ORAL
  Filled 2019-03-31 (×4): qty 1

## 2019-03-31 MED ORDER — ONDANSETRON HCL 4 MG/2ML IJ SOLN: 4.0000 mg | Freq: Four times a day (QID) | INTRAMUSCULAR | Status: DC | PRN

## 2019-03-31 MED ORDER — SODIUM CHLORIDE 0.9 % IV SOLN
INTRAVENOUS | Status: DC
  Administered 2019-03-31 – 2019-04-01 (×2): via INTRAVENOUS

## 2019-03-31 MED ORDER — HEPARIN (PORCINE) 25000 UT/250ML-% IV SOLN
1350.0000 [IU]/h | INTRAVENOUS | Status: DC
  Administered 2019-03-31: 1100 [IU]/h via INTRAVENOUS
  Administered 2019-04-01: 1350 [IU]/h via INTRAVENOUS
  Filled 2019-03-31 (×3): qty 250

## 2019-03-31 MED ORDER — SODIUM CHLORIDE 0.9 % IV BOLUS
500.0000 mL | Freq: Once | INTRAVENOUS | Status: AC
  Administered 2019-03-31: 500 mL via INTRAVENOUS

## 2019-03-31 MED ORDER — ALPRAZOLAM 0.5 MG PO TABS
0.5000 mg | ORAL_TABLET | Freq: Two times a day (BID) | ORAL | Status: DC | PRN
  Administered 2019-03-31 – 2019-04-02 (×2): 0.5 mg via ORAL
  Filled 2019-03-31: qty 1
  Filled 2019-03-31: qty 2

## 2019-03-31 MED ORDER — HEPARIN BOLUS VIA INFUSION
4000.0000 [IU] | Freq: Once | INTRAVENOUS | Status: AC
  Administered 2019-03-31: 4000 [IU] via INTRAVENOUS
  Filled 2019-03-31: qty 4000

## 2019-03-31 MED ORDER — ASPIRIN EC 325 MG PO TBEC
325.0000 mg | DELAYED_RELEASE_TABLET | Freq: Once | ORAL | Status: AC
  Administered 2019-03-31: 325 mg via ORAL
  Filled 2019-03-31: qty 1

## 2019-03-31 MED ORDER — ASPIRIN EC 325 MG PO TBEC
325.0000 mg | DELAYED_RELEASE_TABLET | Freq: Every day | ORAL | Status: DC
  Administered 2019-04-01: 325 mg via ORAL
  Filled 2019-03-31 (×2): qty 1

## 2019-03-31 MED ORDER — IOHEXOL 350 MG/ML SOLN
100.0000 mL | Freq: Once | INTRAVENOUS | Status: AC | PRN
  Administered 2019-03-31: 100 mL via INTRAVENOUS

## 2019-03-31 MED ORDER — VITAMIN D 25 MCG (1000 UNIT) PO TABS
1000.0000 [IU] | ORAL_TABLET | Freq: Every day | ORAL | Status: DC
  Administered 2019-03-31 – 2019-04-03 (×3): 1000 [IU] via ORAL
  Filled 2019-03-31 (×4): qty 1

## 2019-03-31 MED ORDER — PERFLUTREN LIPID MICROSPHERE
1.0000 mL | INTRAVENOUS | Status: AC | PRN
  Administered 2019-03-31: 2 mL via INTRAVENOUS
  Filled 2019-03-31: qty 10

## 2019-03-31 MED ORDER — NITROGLYCERIN 2 % TD OINT
1.0000 [in_us] | TOPICAL_OINTMENT | Freq: Once | TRANSDERMAL | Status: AC
  Administered 2019-03-31: 1 [in_us] via TOPICAL
  Filled 2019-03-31: qty 1

## 2019-03-31 MED ORDER — PANTOPRAZOLE SODIUM 40 MG PO TBEC
40.0000 mg | DELAYED_RELEASE_TABLET | Freq: Every day | ORAL | Status: DC
  Administered 2019-03-31 – 2019-04-03 (×3): 40 mg via ORAL
  Filled 2019-03-31 (×3): qty 1
  Filled 2019-03-31: qty 2

## 2019-03-31 MED ORDER — ATORVASTATIN CALCIUM 40 MG PO TABS
40.0000 mg | ORAL_TABLET | Freq: Every day | ORAL | Status: DC
  Administered 2019-03-31 – 2019-04-02 (×3): 40 mg via ORAL
  Filled 2019-03-31 (×3): qty 1

## 2019-03-31 MED ORDER — NITROGLYCERIN 0.4 MG SL SUBL: 0.4000 mg | SUBLINGUAL_TABLET | SUBLINGUAL | Status: DC | PRN

## 2019-03-31 MED ORDER — HEPARIN BOLUS VIA INFUSION
2500.0000 [IU] | Freq: Once | INTRAVENOUS | Status: AC
  Administered 2019-03-31: 2500 [IU] via INTRAVENOUS
  Filled 2019-03-31: qty 2500

## 2019-03-31 NOTE — ED Notes (Signed)
ED TO INPATIENT HANDOFF REPORT  ED Nurse Name and Phone #: Marguarite Arbour [phone]   S Name/Age/Gender [name] 63 y.o. female Room/Bed: 040C/040C  Code Status   Code Status: Full Code  Home/SNF/Other Home Patient oriented to: self, place, time and situation Is this baseline? Yes   Triage Complete: Triage complete  Chief Complaint enzyme test needed per PCP  Triage Note pt states last night at 9pm she had a sudden onset of CP that lasted for about 10-15 minutes. The "bad" pain has resolved but now has a constant ache in the center of her chest and mid back.    Allergies Allergies  Allergen Reactions  . Flagyl [Metronidazole] Anaphylaxis  . Amoxicillin Swelling  . Other Nausea Only    Tylenol with Codeine #3  . Percocet [Oxycodone-Acetaminophen] Nausea Only  . Plaquenil [Hydroxychloroquine Sulfate] Other (See Comments)    Blurred vision, joint pain  . Sulfa Antibiotics Nausea Only    Level of Care/Admitting Diagnosis ED Disposition    ED Disposition Condition [facility] Area: [place] [[id number]]  Level of Care: Telemetry Cardiac [103]  I expect the patient will be discharged within 24 hours: No (not a candidate for 5C-Observation unit)  Covid Evaluation: Asymptomatic Screening Protocol (No Symptoms)  Diagnosis: Chest pain [id number]  Admitting Physician: Ivor Costa [[id number]]  Attending Physician: Ivor Costa [[id number]]  PT Class (Do Not Modify): Observation [104]  PT Acc Code (Do Not Modify): Observation [10022]       B Medical/Surgery History Past Medical History:  Diagnosis Date  . Anxiety   . Arthritis    low back  . Cervical pain (neck)   . Connective tissue disease ([place])    questionable Lupus (she has yet to see a rheumatologist)  . Depression   . Diverticulitis   . GERD (gastroesophageal reflux disease)   . History of fracture of left hip   . Hyperlipidemia   . Insomnia   . Peripheral edema   . Post-polio syndrome     With left leg paralysis  . Post-viral reactive airway disease   . Postmenopausal   . Sigmoid diverticulosis    Past Surgical History:  Procedure Laterality Date  . ANKLE FUSION     left  . APPENDECTOMY    . CHOLECYSTECTOMY    . KNEE ARTHROPLASTY    . LEG SURGERY     muscle surgery related to polio  . TOTAL HIP ARTHROPLASTY     right x 3  . UMBILICAL HERNIA REPAIR       A IV Location/Drains/Wounds Patient Lines/Drains/Airways Status   Active Line/Drains/Airways    Name:   Placement date:   Placement time:   Site:   Days:   Peripheral IV 03/31/19 Right Antecubital   03/31/19    0155    Antecubital   less than 1          Intake/Output Last 24 hours  Intake/Output Summary (Last 24 hours) at 03/31/2019 1235 Last data filed at 03/31/2019 0011 Gross per 24 hour  Intake 0 ml  Output 0 ml  Net 0 ml    Labs/Imaging Results for orders placed or performed during the hospital encounter of 03/30/19 (from the past 48 hour(s))  Basic metabolic panel     Status: Abnormal   Collection Time: 03/30/19  6:58 PM  Result Value Ref Range   Sodium 133 (L) 135 - 145 mmol/L   Potassium 4.1 3.5 - 5.1 mmol/L  Chloride 98 98 - 111 mmol/L   CO2 25 22 - 32 mmol/L   Glucose, Bld 103 (H) 70 - 99 mg/dL   BUN 13 8 - 23 mg/dL   Creatinine, Ser 0.64 0.44 - 1.00 mg/dL   Calcium 9.1 8.9 - 10.3 mg/dL   GFR calc non Af Amer >60 >60 mL/min   GFR calc Af Amer >60 >60 mL/min   Anion gap 10 5 - 15    Comment: Performed at [facility] [address], [place], [place] [address]  CBC     Status: None   Collection Time: 03/30/19  6:58 PM  Result Value Ref Range   WBC 7.7 4.0 - 10.5 K/uL   RBC 4.69 3.87 - 5.11 MIL/uL   Hemoglobin 14.8 12.0 - 15.0 g/dL   HCT 44.5 36.0 - 46.0 %   MCV 94.9 80.0 - 100.0 fL   MCH 31.6 26.0 - 34.0 pg   MCHC 33.3 30.0 - 36.0 g/dL   RDW 13.2 11.5 - 15.5 %   Platelets 296 150 - 400 K/uL   nRBC 0.0 0.0 - 0.2 %    Comment: Performed at [facility],  [place] [address], [place], [place] [address]  Troponin I (High Sensitivity)     Status: Abnormal   Collection Time: 03/30/19  6:58 PM  Result Value Ref Range   Troponin I (High Sensitivity) 41 (H) <18 ng/L    Comment: (NOTE) Elevated high sensitivity troponin I (hsTnI) values and significant  changes across serial measurements may suggest ACS but many other  chronic and acute conditions are known to elevate hsTnI results.  Refer to the "Links" section for chest pain algorithms and additional  guidance. Performed at [facility], [place] [address] [place], [place] [address]   Troponin I (High Sensitivity)     Status: Abnormal   Collection Time: 03/30/19  9:34 PM  Result Value Ref Range   Troponin I (High Sensitivity) 46 (H) <18 ng/L    Comment: (NOTE) Elevated high sensitivity troponin I (hsTnI) values and significant  changes across serial measurements may suggest ACS but many other  chronic and acute conditions are known to elevate hsTnI results.  Refer to the "Links" section for chest pain algorithms and additional  guidance. Performed at [facility], [place] [address], [place], [place] [address]   SARS CORONAVIRUS 2 (TAT 6-24 HRS) Nasopharyngeal Nasopharyngeal Swab     Status: None   Collection Time: 03/31/19  4:35 AM   Specimen: Nasopharyngeal Swab  Result Value Ref Range   SARS Coronavirus 2 NEGATIVE NEGATIVE    Comment: (NOTE) SARS-CoV-2 target nucleic acids are NOT DETECTED. The SARS-CoV-2 RNA is generally detectable in upper and lower respiratory specimens during the acute phase of infection. Negative results do not preclude SARS-CoV-2 infection, do not rule out co-infections with other pathogens, and should not be used as the sole basis for treatment or other patient management decisions. Negative results must be combined with clinical observations, patient history, and epidemiological information. The expected result is Negative. Fact Sheet for  Patients: SugarRoll.be Fact Sheet for Healthcare Providers: https://www.woods-mathews.com/ This test is not yet approved or cleared by the [place] FDA and  has been authorized for detection and/or diagnosis of SARS-CoV-2 by FDA under an Emergency Use Authorization (EUA). This EUA will remain  in effect (meaning this test can be used) for the duration of the COVID-19 declaration under Section 56 4(b)(1) of the Act, 21  U.S.C. section 360bbb-3(b)(1), unless the authorization is terminated or revoked sooner. Performed at [facility], [place] [address] [place], [place] [address]   Troponin I (High Sensitivity)     Status: Abnormal   Collection Time: 03/31/19  6:07 AM  Result Value Ref Range   Troponin I (High Sensitivity) 50 (H) <18 ng/L    Comment: (NOTE) Elevated high sensitivity troponin I (hsTnI) values and significant  changes across serial measurements may suggest ACS but many other  chronic and acute conditions are known to elevate hsTnI results.  Refer to the "Links" section for chest pain algorithms and additional  guidance. Performed at [facility], [place] [address] [place], [place] [address]   Hemoglobin A1c     Status: None   Collection Time: 03/31/19  6:07 AM  Result Value Ref Range   Hgb A1c MFr Bld 5.6 4.8 - 5.6 %    Comment: (NOTE) Pre diabetes:          5.7%-6.4% Diabetes:              >6.4% Glycemic control for   <7.0% adults with diabetes    Mean Plasma Glucose 114.02 mg/dL    Comment: Performed at [facility] [address], [place], [place] [address]  HIV Antibody (routine testing w rflx)     Status: None   Collection Time: 03/31/19  6:07 AM  Result Value Ref Range   HIV Screen 4th Generation wRfx NON REACTIVE NON REACTIVE    Comment: Performed at [facility] [address], [place], [place] [address]  Lipid panel     Status: Abnormal   Collection Time: 03/31/19  6:07 AM  Result  Value Ref Range   Cholesterol 201 (H) 0 - 200 mg/dL   Triglycerides 93 <150 mg/dL   HDL 44 >40 mg/dL   Total CHOL/HDL Ratio 4.6 RATIO   VLDL 19 0 - 40 mg/dL   LDL Cholesterol 138 (H) 0 - 99 mg/dL    Comment:        Total Cholesterol/HDL:CHD Risk Coronary Heart Disease Risk Table                     Men   Women  1/2 Average Risk   3.4   3.3  Average Risk       5.0   4.4  2 X Average Risk   9.6   7.1  3 X Average Risk  23.4   11.0        Use the calculated Patient Ratio above and the CHD Risk Table to determine the patient's CHD Risk.        ATP III CLASSIFICATION (LDL):  <100     mg/dL   Optimal  100-129  mg/dL   Near or Above                    Optimal  130-159  mg/dL   Borderline  160-189  mg/dL   High  >190     mg/dL   Very High Performed at [facility] [address] [place], [place] [address]   Rapid urine drug screen (hospital performed)     Status: Abnormal   Collection Time: 03/31/19  7:10 AM  Result Value Ref Range   Opiates NONE DETECTED NONE DETECTED   Cocaine NONE DETECTED NONE DETECTED   Benzodiazepines POSITIVE (A) NONE DETECTED   Amphetamines NONE DETECTED NONE DETECTED  Tetrahydrocannabinol NONE DETECTED NONE DETECTED   Barbiturates NONE DETECTED NONE DETECTED    Comment: (NOTE) DRUG SCREEN FOR MEDICAL PURPOSES ONLY.  IF CONFIRMATION IS NEEDED FOR ANY PURPOSE, NOTIFY LAB WITHIN 5 DAYS. LOWEST DETECTABLE LIMITS FOR URINE DRUG SCREEN Drug Class                     Cutoff (ng/mL) Amphetamine and metabolites    1000 Barbiturate and metabolites    200 Benzodiazepine                 [id number] Tricyclics and metabolites     300 Opiates and metabolites        300 Cocaine and metabolites        300 THC                            50 Performed at [facility], [place] [address] [place], [place] [address]   Troponin I (High Sensitivity)     Status: Abnormal   Collection Time: 03/31/19  8:50 AM  Result Value Ref Range   Troponin I (High  Sensitivity) 51 (H) <18 ng/L    Comment: (NOTE) Elevated high sensitivity troponin I (hsTnI) values and significant  changes across serial measurements may suggest ACS but many other  chronic and acute conditions are known to elevate hsTnI results.  Refer to the "Links" section for chest pain algorithms and additional  guidance. Performed at [facility], [place] [address] [place], [place] [address]   Troponin I (High Sensitivity)     Status: Abnormal   Collection Time: 03/31/19 11:13 AM  Result Value Ref Range   Troponin I (High Sensitivity) 49 (H) <18 ng/L    Comment: (NOTE) Elevated high sensitivity troponin I (hsTnI) values and significant  changes across serial measurements may suggest ACS but many other  chronic and acute conditions are known to elevate hsTnI results.  Refer to the "Links" section for chest pain algorithms and additional  guidance. Performed at [facility], [place] [address], [place], [place] [address]   Heparin level (unfractionated)     Status: Abnormal   Collection Time: 03/31/19 11:54 AM  Result Value Ref Range   Heparin Unfractionated 0.15 (L) 0.30 - 0.70 IU/mL    Comment: (NOTE) If heparin results are below expected values, and patient dosage has  been confirmed, suggest follow up testing of antithrombin III levels. Performed at [facility], [place] [address], [place], [place] [address]    Dg Chest 2 View  Result Date: 03/30/2019 CLINICAL DATA:  Acute chest pain for 1 day. EXAM: CHEST - 2 VIEW COMPARISON:  09/25/2017 FINDINGS: The cardiomediastinal silhouette is unremarkable. There is no evidence of focal airspace disease, pulmonary edema, suspicious pulmonary nodule/mass, pleural effusion, or pneumothorax. No acute bony abnormalities are identified. IMPRESSION: No active cardiopulmonary disease. Electronically Signed   By: Margarette Canada M.D.   On: 03/30/2019 19:28   Ct Angio Chest/abd/pel For Dissection W And/or Wo Contrast  Result  Date: 03/31/2019 CLINICAL DATA:  Chest/back pain, acute, aortic dissection suspect EXAM: CT ANGIOGRAPHY CHEST, ABDOMEN AND PELVIS TECHNIQUE: Multidetector CT imaging through the chest, abdomen and pelvis was performed using the standard protocol during bolus administration of intravenous contrast. Multiplanar reconstructed images and MIPs were obtained and reviewed to evaluate the vascular anatomy. CONTRAST:  162mL OMNIPAQUE IOHEXOL 350 MG/ML SOLN COMPARISON:  Chest radiograph yesterday. Abdominopelvic CT 03/31/2016  FINDINGS: CTA CHEST FINDINGS Cardiovascular: Thoracic aorta is normal in caliber. No aortic dissection, acute aortic syndrome or aneurysm. Conventional branching pattern from the aortic arch. Aortic valvular calcifications. No central pulmonary embolus to the lobar level. Heart is normal in size. No pericardial effusion. Mediastinum/Nodes: Calcified right hilar nodes consistent with prior granulomatous disease. No suspicious noncalcified adenopathy. Subcentimeter left thyroid nodule does not meet size criteria for further evaluation. Patulous esophagus. Small hiatal hernia. Lungs/Pleura: Heterogeneous pulmonary parenchyma. 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 Clustered nodules in the subpleural right middle lobe, series 8, image 104, chronic and unchanged from 2017 abdominal CT. No pulmonary edema. No pleural fluid. The trachea and mainstem bronchi are patent. Musculoskeletal: Mild multilevel degenerative change in the spine. There are no acute or suspicious osseous abnormalities. Degenerative change in the right shoulder. Review of the MIP images confirms the above findings. CTA ABDOMEN AND PELVIS FINDINGS VASCULAR Aorta: Normal caliber aorta without aneurysm, dissection, vasculitis or significant stenosis. Mild atherosclerosis. Celiac: Patent without evidence of aneurysm, dissection, vasculitis or significant stenosis. SMA:  Patent without evidence of aneurysm, dissection, vasculitis or significant stenosis. Renals: Both renal arteries are patent without evidence of aneurysm, dissection, vasculitis, fibromuscular dysplasia or significant stenosis. IMA: Patent without evidence of aneurysm, dissection, vasculitis or significant stenosis. Inflow: Patent without evidence of aneurysm, dissection, vasculitis or significant stenosis. Veins: Limited assessment on this arterial phase exam. Review of the MIP images confirms the above findings. NON-VASCULAR Hepatobiliary: No focal liver abnormality is seen. Status post cholecystectomy. No biliary dilatation. Pancreas: No ductal dilatation or inflammation. Spleen: Normal in size and arterial enhancement. Adrenals/Urinary Tract: No adrenal nodule. Suggestion of slight left renal atrophy. No hydronephrosis. Tiny cortical hypodensities in the left kidney are too small to characterize. Urinary bladder partially distended. No bladder wall thickening. Stomach/Bowel: Small hiatal hernia. Stomach is decompressed and not well assessed. No small bowel wall thickening or inflammatory change. Prior appendectomy with surgical clips in the right lower quadrant. Small volume of stool throughout the colon. Colonic diverticulosis from the splenic flexure distally, prominent in the sigmoid. No evidence of diverticulitis. Lymphatic: No abdominopelvic adenopathy. Reproductive: Status post hysterectomy. No adnexal masses. Other: Postsurgical change of the right anterior abdominal wall with mesh. Fat containing umbilical hernia. No free air or free fluid. Musculoskeletal: Right hip arthroplasty. Degenerative change in the spine with multilevel endplate changes. There are no acute or suspicious osseous abnormalities. Review of the MIP images confirms the above findings. IMPRESSION: 1. No aortic dissection or acute aortic abnormality. Mild thoracoabdominal aortic atherosclerosis. Aortic valvular calcifications, consider  further evaluation with echocardiogram. 2. No acute abnormality in the chest, abdomen, or pelvis. 3. Incidental colonic diverticulosis without diverticulitis. Small hiatal hernia. 4. Right abdominal wall hernia repair with mesh. Fat containing umbilical hernia. Aortic Atherosclerosis (ICD10-I70.0). Electronically Signed   By: Keith Rake M.D.   On: 03/31/2019 02:42    Pending Labs Unresulted Labs (From admission, onward)    Start     Ordered   04/01/19 0500  Heparin level (unfractionated)  Daily,   R     03/31/19 0502   04/01/19 0500  CBC  Daily,   R     03/31/19 0502   03/31/19 0507  HIV4GL Save Tube  (HIV Antibody (Routine testing w reflex) panel)  Once,   STAT     03/31/19 0506          Vitals/Pain Today's Vitals   03/31/19 1045 03/31/19 1100 03/31/19  1105 03/31/19 1115  BP:  104/66 104/66   Pulse: 71 73 74   Resp: 13 (!) 22 15   Temp:      TempSrc:      SpO2: 100% 95% 100%   Weight:      Height:      PainSc:    4     Isolation Precautions No active isolations  Medications Medications  sodium chloride flush (NS) 0.9 % injection 3 mL (3 mLs Intravenous Not Given 03/31/19 0429)  morphine 2 MG/ML injection 2 mg (has no administration in time range)  nitroGLYCERIN (NITROSTAT) SL tablet 0.4 mg (has no administration in time range)  ALPRAZolam (XANAX) tablet 0.5 mg (0.5 mg Oral Given 03/31/19 0531)  traZODone (DESYREL) tablet 100 mg (has no administration in time range)  venlafaxine XR (EFFEXOR-XR) 24 hr capsule 75 mg (75 mg Oral Given 03/31/19 0820)  docusate sodium (COLACE) capsule 100 mg (100 mg Oral Given 03/31/19 0939)  pantoprazole (PROTONIX) EC tablet 40 mg (40 mg Oral Given 03/31/19 0939)  gabapentin (NEURONTIN) capsule 300 mg (300 mg Oral Given 03/31/19 0939)  cholecalciferol (VITAMIN D3) tablet 1,000 Units (1,000 Units Oral Given 03/31/19 0939)  atorvastatin (LIPITOR) tablet 40 mg (40 mg Oral Not Given 03/31/19 0517)  0.9 %  sodium chloride infusion (  Intravenous New Bag/Given 03/31/19 0535)  acetaminophen (TYLENOL) tablet 650 mg (650 mg Oral Given 03/31/19 0939)  ondansetron (ZOFRAN) injection 4 mg (has no administration in time range)  heparin ADULT infusion 100 units/mL (25000 units/260mL sodium chloride 0.45%) (1,100 Units/hr Intravenous New Bag/Given 03/31/19 0542)  aspirin EC tablet 325 mg (0 mg Oral Hold 03/31/19 1106)  vitamin B-12 (CYANOCOBALAMIN) tablet 250 mcg (250 mcg Oral Given 03/31/19 1108)  sodium chloride 0.9 % bolus 500 mL (0 mLs Intravenous Stopped 03/31/19 0543)  fentaNYL (SUBLIMAZE) injection 50 mcg (50 mcg Intravenous Given 03/31/19 0201)  iohexol (OMNIPAQUE) 350 MG/ML injection 100 mL (100 mLs Intravenous Contrast Given 03/31/19 0211)  nitroGLYCERIN (NITROGLYN) 2 % ointment 1 inch (1 inch Topical Given 03/31/19 0412)  aspirin EC tablet 325 mg (325 mg Oral Given 03/31/19 0531)  heparin bolus via infusion 4,000 Units (4,000 Units Intravenous Bolus from Bag 03/31/19 0543)    Mobility power wheelchair Low fall risk   Focused Assessments Cardiac Assessment Handoff:  Cardiac Rhythm: Normal sinus rhythm No results found for: CKTOTAL, CKMB, CKMBINDEX, TROPONINI Lab Results  Component Value Date   DDIMER 0.55 (H) 03/04/2013   Does the Patient currently have chest pain? occasional chest pressure    R Recommendations: See Admitting Provider Note  Report given to:   Additional Notes:

## 2019-03-31 NOTE — H&P (Signed)
History and Physical    [name] [id number] DOB: 1955/10/03 DOA: 03/30/2019  Referring MD/NP/PA:   PCP: [facility]   Patient coming from:  The patient is coming from home.  At baseline, pt is independent for most of ADL.        Chief Complaint: chest pain  HPI: [name] is a 63 y.o. female with medical history significant of HLD, GERD, depression with anxiety, diverticulitis, wheelchair bond due to hx of polio, obesity,  who presents with chest pain.  Patient states that her chest pain started since Thursday night.  It is located in substernal area, moderate, constantly tight feeling, radiating to the mid back.  She had mild shortness of breath earlier, which has resolved.  No cough, fever or chills.  Patient does not have nausea, vomiting, diarrhea, abdominal pain.  No symptoms of UTI or unilateral weakness.  Patient was seen by PCP Friday afternoon, and had EKG in office which was abnormal, therefore patient was sent o ED for further evaluation treatment. Of note, there is diagnosis of connective tissue disease in her medical list, but patient denies this diagnosis.  ED Course: pt was found to have troponin 41, 46, WBC 7.1, pending COVID-19 test, electrolytes renal function okay, temperature 99.1, blood pressure 138/74, heart rate 67, oxygen saturation 99% on room air.  Chest x-ray negative.  CT angiogram is negative for dissection and central PE.  Patient is placed on telemetry bed for observation.  Review of Systems:   General: no fevers, chills, no body weight gain, has fatigue HEENT: no blurry vision, hearing changes or sore throat Respiratory: no dyspnea, coughing, wheezing CV: has chest pain, no palpitations GI: no nausea, vomiting, abdominal pain, diarrhea, constipation GU: no dysuria, burning on urination, increased urinary frequency, hematuria  Ext: no leg edema Neuro: no unilateral weakness, numbness, or tingling, no vision change  or hearing loss Skin: no rash, no skin tear. MSK: No muscle spasm, no deformity, no limitation of range of movement in spin Heme: No easy bruising.  Travel history: No recent long distant travel.  Allergy:  Allergies  Allergen Reactions  . Flagyl [Metronidazole] Anaphylaxis  . Amoxicillin Swelling  . Other Nausea Only    Tylenol with Codeine #3  . Percocet [Oxycodone-Acetaminophen] Nausea Only  . Plaquenil [Hydroxychloroquine Sulfate] Other (See Comments)    Blurred vision, joint pain  . Sulfa Antibiotics Nausea Only    Past Medical History:  Diagnosis Date  . Anxiety   . Arthritis    low back  . Cervical pain (neck)   . Connective tissue disease ([place])    questionable Lupus (she has yet to see a rheumatologist)  . Depression   . Diverticulitis   . GERD (gastroesophageal reflux disease)   . History of fracture of left hip   . Hyperlipidemia   . Insomnia   . Peripheral edema   . Post-polio syndrome    With left leg paralysis  . Post-viral reactive airway disease   . Postmenopausal   . Sigmoid diverticulosis     Past Surgical History:  Procedure Laterality Date  . ANKLE FUSION     left  . APPENDECTOMY    . CHOLECYSTECTOMY    . KNEE ARTHROPLASTY    . LEG SURGERY     muscle surgery related to polio  . TOTAL HIP ARTHROPLASTY     right x 3  . UMBILICAL HERNIA REPAIR      Social History:  reports that  she has never smoked. She has never used smokeless tobacco. She reports that she does not drink alcohol. No history on file for drug.  Family History:  Family History  Problem Relation Age of Onset  . Cancer Mother   . Heart disease Father   . CAD Other        FAM Hx OF CAD  . Diabetes Other        FAM Hx of DM  . Hypertension Other      Prior to Admission medications   Medication Sig Start Date End Date Taking? Authorizing Provider  ALPRAZolam Duanne Moron) 0.5 MG tablet Take 0.5 mg by mouth 2 (two) times daily as needed for sleep.    Yes [provider]  Cholecalciferol (D3 HIGH POTENCY) 1000 UNITS capsule Take 1,000 Units by mouth daily.   Yes [provider]  docusate sodium (COLACE) 100 MG capsule Take 100 mg by mouth daily.   Yes [provider]  gabapentin (NEURONTIN) 300 MG capsule Take 300 mg by mouth daily.   Yes [provider]  pantoprazole (PROTONIX) 40 MG tablet Take 40 mg by mouth daily.   Yes [provider]  traZODone (DESYREL) 100 MG tablet Take 100 mg by mouth at bedtime.    Yes [provider]  venlafaxine XR (EFFEXOR-XR) 75 MG 24 hr capsule Take 75 mg by mouth daily with breakfast.   Yes [provider]  vitamin B-12 (CYANOCOBALAMIN) 250 MCG tablet Take 250 mcg by mouth daily.   Yes [provider]  benzonatate (TESSALON) 100 MG capsule Take 1 capsule (100 mg total) by mouth every 8 (eight) hours. Patient not taking: Reported on 03/31/2019 09/25/17   Horton, Barbette Hair, MD  loratadine (CLARITIN) 10 MG tablet Take 1 tablet (10 mg total) by mouth daily. Patient not taking: Reported on 03/31/2019 09/25/17   Horton, Barbette Hair, MD  oxymetazoline (AFRIN NASAL SPRAY) 0.05 % nasal spray Place 1 spray into both nostrils 2 (two) times daily. Patient not taking: Reported on 03/31/2019 09/25/17   Horton, Barbette Hair, MD  sodium chloride (OCEAN) 0.65 % SOLN nasal spray Place 1 spray into both nostrils as needed for congestion. Patient not taking: Reported on 03/31/2019 09/25/17   Merryl Hacker, MD    Physical Exam: Vitals:   03/31/19 0013 03/31/19 0130 03/31/19 0200 03/31/19 0400  BP: (!) 142/62 (!) 151/78 (!) 134/30 138/74  Pulse: 69 71 71 67  Resp: 17 19 16 14   Temp: 98 F (36.7 C)     TempSrc: Oral     SpO2: 99% 98% 98% 99%  Weight:    113.4 kg  Height:    5\' 8"  (1.727 m)   General: Not in acute distress HEENT:       Eyes: PERRL, EOMI, no scleral icterus.       ENT: No discharge from the ears and nose, no pharynx injection, no tonsillar enlargement.         Neck: No JVD, no bruit, no mass felt. Heme: No neck lymph node enlargement. Cardiac: S1/S2, RRR, No murmurs, No gallops or rubs. Respiratory:  No rales, wheezing, rhonchi or rubs. GI: Soft, nondistended, nontender, no rebound pain, no organomegaly, BS present. GU: No hematuria Ext: No pitting leg edema bilaterally. 2+DP/PT pulse bilaterally. Musculoskeletal: No joint deformities, No joint redness or warmth, no limitation of ROM in spin. Skin: No rashes.  Neuro: Alert, oriented X3, cranial nerves II-XII grossly intact, moves all extremities normally. Psych: Patient is not  psychotic, no suicidal or hemocidal ideation.  Labs on Admission: I have personally reviewed following labs and imaging studies  CBC: Recent Labs  Lab 03/30/19 1858  WBC 7.7  HGB 14.8  HCT 44.5  MCV 94.9  PLT 0000000   Basic Metabolic Panel: Recent Labs  Lab 03/30/19 1858  NA 133*  K 4.1  CL 98  CO2 25  GLUCOSE 103*  BUN 13  CREATININE 0.64  CALCIUM 9.1   GFR: Estimated Creatinine Clearance: 92.6 mL/min (by C-G formula based on SCr of 0.64 mg/dL). Liver Function Tests: No results for input(s): AST, ALT, ALKPHOS, BILITOT, PROT, ALBUMIN in the last 168 hours. No results for input(s): LIPASE, AMYLASE in the last 168 hours. No results for input(s): AMMONIA in the last 168 hours. Coagulation Profile: No results for input(s): INR, PROTIME in the last 168 hours. Cardiac Enzymes: No results for input(s): CKTOTAL, CKMB, CKMBINDEX, TROPONINI in the last 168 hours. BNP (last 3 results) No results for input(s): PROBNP in the last 8760 hours. HbA1C: No results for input(s): HGBA1C in the last 72 hours. CBG: No results for input(s): GLUCAP in the last 168 hours. Lipid Profile: No results for input(s): CHOL, HDL, LDLCALC, TRIG, CHOLHDL, LDLDIRECT in the last 72 hours. Thyroid Function Tests: No results for input(s): TSH, T4TOTAL, FREET4, T3FREE, THYROIDAB in the last 72 hours. Anemia Panel: No results for  input(s): VITAMINB12, FOLATE, FERRITIN, TIBC, IRON, RETICCTPCT in the last 72 hours. Urine analysis:    Component Value Date/Time   COLORURINE YELLOW 03/30/2016 2015   APPEARANCEUR CLEAR 03/30/2016 2015   LABSPEC 1.013 03/30/2016 2015   PHURINE 5.5 03/30/2016 2015   GLUCOSEU NEGATIVE 03/30/2016 2015   HGBUR NEGATIVE 03/30/2016 2015   BILIRUBINUR NEGATIVE 03/30/2016 2015   KETONESUR NEGATIVE 03/30/2016 2015   PROTEINUR NEGATIVE 03/30/2016 2015   UROBILINOGEN 0.2 06/03/2010 0623   NITRITE NEGATIVE 03/30/2016 2015   LEUKOCYTESUR NEGATIVE 03/30/2016 2015   Sepsis Labs: @LABRCNTIP (procalcitonin:4,lacticidven:4) )No results found for this or any previous visit (from the past 240 hour(s)).   Radiological Exams on Admission: Dg Chest 2 View  Result Date: 03/30/2019 CLINICAL DATA:  Acute chest pain for 1 day. EXAM: CHEST - 2 VIEW COMPARISON:  09/25/2017 FINDINGS: The cardiomediastinal silhouette is unremarkable. There is no evidence of focal airspace disease, pulmonary edema, suspicious pulmonary nodule/mass, pleural effusion, or pneumothorax. No acute bony abnormalities are identified. IMPRESSION: No active cardiopulmonary disease. Electronically Signed   By: Margarette Canada M.D.   On: 03/30/2019 19:28   Ct Angio Chest/abd/pel For Dissection W And/or Wo Contrast  Result Date: 03/31/2019 CLINICAL DATA:  Chest/back pain, acute, aortic dissection suspect EXAM: CT ANGIOGRAPHY CHEST, ABDOMEN AND PELVIS TECHNIQUE: Multidetector CT imaging through the chest, abdomen and pelvis was performed using the standard protocol during bolus administration of intravenous contrast. Multiplanar reconstructed images and MIPs were obtained and reviewed to evaluate the vascular anatomy. CONTRAST:  118mL OMNIPAQUE IOHEXOL 350 MG/ML SOLN COMPARISON:  Chest radiograph yesterday. Abdominopelvic CT 03/31/2016 FINDINGS: CTA CHEST FINDINGS Cardiovascular: Thoracic aorta is normal in caliber. No aortic dissection, acute aortic  syndrome or aneurysm. Conventional branching pattern from the aortic arch. Aortic valvular calcifications. No central pulmonary embolus to the lobar level. Heart is normal in size. No pericardial effusion. Mediastinum/Nodes: Calcified right hilar nodes consistent with prior granulomatous disease. No suspicious noncalcified adenopathy. Subcentimeter left thyroid nodule does not meet size criteria for further evaluation. Patulous esophagus. Small hiatal hernia. Lungs/Pleura: Heterogeneous pulmonary parenchyma. 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 Clustered nodules in the subpleural right middle lobe, series 8, image 104, chronic and unchanged from 2017 abdominal CT. No pulmonary edema. No pleural fluid. The trachea and mainstem bronchi are patent. Musculoskeletal: Mild multilevel degenerative change in the spine. There are no acute or suspicious osseous abnormalities. Degenerative change in the right shoulder. Review of the MIP images confirms the above findings. CTA ABDOMEN AND PELVIS FINDINGS VASCULAR Aorta: Normal caliber aorta without aneurysm, dissection, vasculitis or significant stenosis. Mild atherosclerosis. Celiac: Patent without evidence of aneurysm, dissection, vasculitis or significant stenosis. SMA: Patent without evidence of aneurysm, dissection, vasculitis or significant stenosis. Renals: Both renal arteries are patent without evidence of aneurysm, dissection, vasculitis, fibromuscular dysplasia or significant stenosis. IMA: Patent without evidence of aneurysm, dissection, vasculitis or significant stenosis. Inflow: Patent without evidence of aneurysm, dissection, vasculitis or significant stenosis. Veins: Limited assessment on this arterial phase exam. Review of the MIP images confirms the above findings. NON-VASCULAR Hepatobiliary: No focal liver abnormality is seen. Status post cholecystectomy. No biliary dilatation.  Pancreas: No ductal dilatation or inflammation. Spleen: Normal in size and arterial enhancement. Adrenals/Urinary Tract: No adrenal nodule. Suggestion of slight left renal atrophy. No hydronephrosis. Tiny cortical hypodensities in the left kidney are too small to characterize. Urinary bladder partially distended. No bladder wall thickening. Stomach/Bowel: Small hiatal hernia. Stomach is decompressed and not well assessed. No small bowel wall thickening or inflammatory change. Prior appendectomy with surgical clips in the right lower quadrant. Small volume of stool throughout the colon. Colonic diverticulosis from the splenic flexure distally, prominent in the sigmoid. No evidence of diverticulitis. Lymphatic: No abdominopelvic adenopathy. Reproductive: Status post hysterectomy. No adnexal masses. Other: Postsurgical change of the right anterior abdominal wall with mesh. Fat containing umbilical hernia. No free air or free fluid. Musculoskeletal: Right hip arthroplasty. Degenerative change in the spine with multilevel endplate changes. There are no acute or suspicious osseous abnormalities. Review of the MIP images confirms the above findings. IMPRESSION: 1. No aortic dissection or acute aortic abnormality. Mild thoracoabdominal aortic atherosclerosis. Aortic valvular calcifications, consider further evaluation with echocardiogram. 2. No acute abnormality in the chest, abdomen, or pelvis. 3. Incidental colonic diverticulosis without diverticulitis. Small hiatal hernia. 4. Right abdominal wall hernia repair with mesh. Fat containing umbilical hernia. Aortic Atherosclerosis (ICD10-I70.0). Electronically Signed   By: Keith Rake M.D.   On: 03/31/2019 02:42     EKG: Independently reviewed.  Sinus rhythm, QTC 452, LAE, new T wave inversion in lateral leads, V4-V6, and II.  Assessment/Plan Principal Problem:   Chest pain Active Problems:   NSTEMI (non-ST elevated myocardial infarction) (HCC)    Hyperlipidemia   GERD (gastroesophageal reflux disease)   Depression with anxiety   Chest pain and possible NSTEMI: Patient chest pain has been going on since Thursday night.  She has new to reversible EKG.  Troponin is +41 --> 46.  Clinically consistent with a possible non-STEMI.  CT angiogram is negative for dissection and central PE.  - will place on Tele bed for obs - Trend Trop - Repeat EKG in the am  - prn Nitroglycerin, Morphine - start aspirin, lipitor  - Risk factor stratification: will check FLP and A1C  - check UDS - inpt card consult was requested via Epic  Hyperlipidemia: not taking meds now -started lipitor 40 mg daily  GERD (gastroesophageal reflux disease): -protonix  Depression with anxiety: no SI or HI. -Continue home Xanax and Effexor   DVT ppx: on IV Heparin  Code Status: Full code Family Communication: None at bed side.    Disposition Plan:  Anticipate discharge back to previous home environment Consults called:  none Admission status: Obs / tele     Date of Service 03/31/2019    [facility]   If 7PM-7AM, please contact night-coverage [email or web address] Password [email or web address] 03/31/2019, 5:02 AM

## 2019-03-31 NOTE — Progress Notes (Signed)
  Echocardiogram 2D Echocardiogram has been performed.  [name] 03/31/2019, 3:53 PM

## 2019-03-31 NOTE — ED Notes (Signed)
Lunch Tray Ordered @ 1100. 

## 2019-03-31 NOTE — ED Notes (Signed)
Pt refused both morphine and nitro for chest pain

## 2019-03-31 NOTE — ED Provider Notes (Addendum)
[facility] Provider Note  CSN: [id number] Arrival date & time: 03/30/19 [id number]  Chief Complaint(s) Chest Pain  HPI [name] is a 63 y.o. female the past medical history listed below who presents to the emergency department with sharp stabbing chest pain which radiated to her back.  Pain was severe and lasted approximately 15 to 20 minutes.  Since then she has had a dull substernal aching.  Pain nonexertional.  Nonpleuritic.  No alleviating or aggravating factors.  No associated shortness of breath.  No recent fevers or infections.  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  No prior history of DVTs or PEs.  Seen by her PCP who noted new EKG changes.    HPI  Past Medical History Past Medical History:  Diagnosis Date   Anxiety    Arthritis    low back   Cervical pain (neck)    Connective tissue disease ([place])    questionable Lupus (she has yet to see a rheumatologist)   Depression    Diverticulitis    GERD (gastroesophageal reflux disease)    History of fracture of left hip    Hyperlipidemia    Insomnia    Peripheral edema    Post-polio syndrome    With left leg paralysis   Post-viral reactive airway disease    Postmenopausal    Sigmoid diverticulosis    Patient Active Problem List   Diagnosis Date Noted   Chest pain 03/31/2019   NSTEMI (non-ST elevated myocardial infarction) ([place]) 03/31/2019   Hyperlipidemia    GERD (gastroesophageal reflux disease)    Depression with anxiety    Edema 11/09/2013   Obesity (BMI 35.0-39.9 without comorbidity) 04/12/2013   Home Medication(s) Prior to Admission medications   Medication Sig Start Date End Date Taking? Authorizing Provider  ALPRAZolam Duanne Moron) 0.5 MG tablet Take 0.5 mg by mouth 2 (two) times daily as needed for sleep.    Yes [provider]  Cholecalciferol (D3 HIGH POTENCY) 1000 UNITS capsule Take 1,000 Units by mouth daily.    Yes [provider]  docusate sodium (COLACE) 100 MG capsule Take 100 mg by mouth daily.   Yes [provider]  gabapentin (NEURONTIN) 300 MG capsule Take 300 mg by mouth daily.   Yes [provider]  pantoprazole (PROTONIX) 40 MG tablet Take 40 mg by mouth daily.   Yes [provider]  traZODone (DESYREL) 100 MG tablet Take 100 mg by mouth at bedtime.    Yes [provider]  venlafaxine XR (EFFEXOR-XR) 75 MG 24 hr capsule Take 75 mg by mouth daily with breakfast.   Yes [provider]  vitamin B-12 (CYANOCOBALAMIN) 250 MCG tablet Take 250 mcg by mouth daily.   Yes [provider]  benzonatate (TESSALON) 100 MG capsule Take 1 capsule (100 mg total) by mouth every 8 (eight) hours. Patient not taking: Reported on 03/31/2019 09/25/17   Horton, Barbette Hair, MD  loratadine (CLARITIN) 10 MG tablet Take 1 tablet (10 mg total) by mouth daily. Patient not taking: Reported on 03/31/2019 09/25/17   Horton, Barbette Hair, MD  oxymetazoline (AFRIN NASAL SPRAY) 0.05 % nasal spray Place 1 spray into both nostrils 2 (two) times daily. Patient not taking: Reported on 03/31/2019 09/25/17   Horton, Barbette Hair, MD  sodium chloride (OCEAN) 0.65 % SOLN nasal spray Place 1 spray into both nostrils as needed for congestion. Patient not taking: Reported on 03/31/2019 09/25/17   Horton, Barbette Hair,  MD                                                                                                                                    Past Surgical History Past Surgical History:  Procedure Laterality Date   ANKLE FUSION     left   APPENDECTOMY     CHOLECYSTECTOMY     KNEE ARTHROPLASTY     LEG SURGERY     muscle surgery related to polio   TOTAL HIP ARTHROPLASTY     right x 3   UMBILICAL HERNIA REPAIR     Family History Family History  Problem Relation Age of Onset   Cancer Mother    Heart disease Father    CAD Other        FAM Hx OF CAD    Diabetes Other        FAM Hx of DM   Hypertension Other     Social History Social History   Tobacco Use   Smoking status: Never Smoker   Smokeless tobacco: Never Used  Substance Use Topics   Alcohol use: No   Drug use: Not on file   Allergies Flagyl [metronidazole], Amoxicillin, Other, Percocet [oxycodone-acetaminophen], Plaquenil [hydroxychloroquine sulfate], and Sulfa antibiotics  Review of Systems Review of Systems All other systems are reviewed and are negative for acute change except as noted in the HPI  Physical Exam Vital Signs  I have reviewed the triage vital signs BP (!) 142/62 (BP Location: Left Arm)    Pulse 69    Temp 98 F (36.7 C) (Oral)    Resp 17    SpO2 99%   Physical Exam Vitals signs reviewed.  Constitutional:      General: She is not in acute distress.    Appearance: She is well-developed. She is not diaphoretic.  HENT:     Head: Normocephalic and atraumatic.     Nose: Nose normal.  Eyes:     General: No scleral icterus.       Right eye: No discharge.        Left eye: No discharge.     Conjunctiva/sclera: Conjunctivae normal.     Pupils: Pupils are equal, round, and reactive to light.  Neck:     Musculoskeletal: Normal range of motion and neck supple.  Cardiovascular:     Rate and Rhythm: Normal rate and regular rhythm.     Heart sounds: No murmur. No friction rub. No gallop.   Pulmonary:     Effort: Pulmonary effort is normal. No respiratory distress.     Breath sounds: Normal breath sounds. No stridor. No rales.  Abdominal:     General: There is no distension.     Palpations: Abdomen is soft.     Tenderness: There is no abdominal tenderness.  Musculoskeletal:        General: No tenderness.  Skin:    General: Skin is warm and  dry.     Findings: No erythema or rash.  Neurological:     Mental Status: She is alert and oriented to person, place, and time.     Comments: Left lower extremity atrophy     ED Results and  Treatments Labs (all labs ordered are listed, but only abnormal results are displayed) Labs Reviewed  BASIC METABOLIC PANEL - Abnormal; Notable for the following components:      Result Value   Sodium 133 (*)    Glucose, Bld 103 (*)    All other components within normal limits  TROPONIN I (HIGH SENSITIVITY) - Abnormal; Notable for the following components:   Troponin I (High Sensitivity) 41 (*)    All other components within normal limits  TROPONIN I (HIGH SENSITIVITY) - Abnormal; Notable for the following components:   Troponin I (High Sensitivity) 46 (*)    All other components within normal limits  SARS CORONAVIRUS 2 (TAT 6-24 HRS)  CBC  HEMOGLOBIN A1C  LIPID PANEL  RAPID URINE DRUG SCREEN, HOSP PERFORMED  TROPONIN I (HIGH SENSITIVITY)                                                                                                                         EKG  EKG Interpretation  Date/Time:  Friday March 30 2019 18:48:43 EDT Ventricular Rate:  76 PR Interval:  162 QRS Duration: 92 QT Interval:  402 QTC Calculation: 452 R Axis:   -16 Text Interpretation:  Normal sinus rhythm Left ventricular hypertrophy with repolarization abnormality ( R in aVL , Cornell product ) Abnormal ECG new ST dep in I and aVL, II Confirmed by Addison Lank [phone]) on 03/31/2019 12:42:47 AM      Radiology Dg Chest 2 View  Result Date: 03/30/2019 CLINICAL DATA:  Acute chest pain for 1 day. EXAM: CHEST - 2 VIEW COMPARISON:  09/25/2017 FINDINGS: The cardiomediastinal silhouette is unremarkable. There is no evidence of focal airspace disease, pulmonary edema, suspicious pulmonary nodule/mass, pleural effusion, or pneumothorax. No acute bony abnormalities are identified. IMPRESSION: No active cardiopulmonary disease. Electronically Signed   By: Margarette Canada M.D.   On: 03/30/2019 19:28   Ct Angio Chest/abd/pel For Dissection W And/or Wo Contrast  Result Date: 03/31/2019 CLINICAL DATA:  Chest/back  pain, acute, aortic dissection suspect EXAM: CT ANGIOGRAPHY CHEST, ABDOMEN AND PELVIS TECHNIQUE: Multidetector CT imaging through the chest, abdomen and pelvis was performed using the standard protocol during bolus administration of intravenous contrast. Multiplanar reconstructed images and MIPs were obtained and reviewed to evaluate the vascular anatomy. CONTRAST:  156mL OMNIPAQUE IOHEXOL 350 MG/ML SOLN COMPARISON:  Chest radiograph yesterday. Abdominopelvic CT 03/31/2016 FINDINGS: CTA CHEST FINDINGS Cardiovascular: Thoracic aorta is normal in caliber. No aortic dissection, acute aortic syndrome or aneurysm. Conventional branching pattern from the aortic arch. Aortic valvular calcifications. No central pulmonary embolus to the lobar level. Heart is normal in size. No pericardial effusion. Mediastinum/Nodes: Calcified right hilar nodes consistent with prior granulomatous disease. No suspicious noncalcified  adenopathy. Subcentimeter left thyroid nodule does not meet size criteria for further evaluation. Patulous esophagus. Small hiatal hernia. Lungs/Pleura: Heterogeneous pulmonary parenchyma. 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 Clustered nodules in the subpleural right middle lobe, series 8, image 104, chronic and unchanged from 2017 abdominal CT. No pulmonary edema. No pleural fluid. The trachea and mainstem bronchi are patent. Musculoskeletal: Mild multilevel degenerative change in the spine. There are no acute or suspicious osseous abnormalities. Degenerative change in the right shoulder. Review of the MIP images confirms the above findings. CTA ABDOMEN AND PELVIS FINDINGS VASCULAR Aorta: Normal caliber aorta without aneurysm, dissection, vasculitis or significant stenosis. Mild atherosclerosis. Celiac: Patent without evidence of aneurysm, dissection, vasculitis or significant stenosis. SMA: Patent without evidence of aneurysm,  dissection, vasculitis or significant stenosis. Renals: Both renal arteries are patent without evidence of aneurysm, dissection, vasculitis, fibromuscular dysplasia or significant stenosis. IMA: Patent without evidence of aneurysm, dissection, vasculitis or significant stenosis. Inflow: Patent without evidence of aneurysm, dissection, vasculitis or significant stenosis. Veins: Limited assessment on this arterial phase exam. Review of the MIP images confirms the above findings. NON-VASCULAR Hepatobiliary: No focal liver abnormality is seen. Status post cholecystectomy. No biliary dilatation. Pancreas: No ductal dilatation or inflammation. Spleen: Normal in size and arterial enhancement. Adrenals/Urinary Tract: No adrenal nodule. Suggestion of slight left renal atrophy. No hydronephrosis. Tiny cortical hypodensities in the left kidney are too small to characterize. Urinary bladder partially distended. No bladder wall thickening. Stomach/Bowel: Small hiatal hernia. Stomach is decompressed and not well assessed. No small bowel wall thickening or inflammatory change. Prior appendectomy with surgical clips in the right lower quadrant. Small volume of stool throughout the colon. Colonic diverticulosis from the splenic flexure distally, prominent in the sigmoid. No evidence of diverticulitis. Lymphatic: No abdominopelvic adenopathy. Reproductive: Status post hysterectomy. No adnexal masses. Other: Postsurgical change of the right anterior abdominal wall with mesh. Fat containing umbilical hernia. No free air or free fluid. Musculoskeletal: Right hip arthroplasty. Degenerative change in the spine with multilevel endplate changes. There are no acute or suspicious osseous abnormalities. Review of the MIP images confirms the above findings. IMPRESSION: 1. No aortic dissection or acute aortic abnormality. Mild thoracoabdominal aortic atherosclerosis. Aortic valvular calcifications, consider further evaluation with  echocardiogram. 2. No acute abnormality in the chest, abdomen, or pelvis. 3. Incidental colonic diverticulosis without diverticulitis. Small hiatal hernia. 4. Right abdominal wall hernia repair with mesh. Fat containing umbilical hernia. Aortic Atherosclerosis (ICD10-I70.0). Electronically Signed   By: Keith Rake M.D.   On: 03/31/2019 02:42    Pertinent labs & imaging results that were available during my care of the patient were reviewed by me and considered in my medical decision making (see chart for details).  Medications Ordered in ED Medications  sodium chloride flush (NS) 0.9 % injection 3 mL (3 mLs Intravenous Not Given 03/31/19 0429)  morphine 2 MG/ML injection 2 mg (has no administration in time range)  nitroGLYCERIN (NITROSTAT) SL tablet 0.4 mg (has no administration in time range)  aspirin EC tablet 325 mg (has no administration in time range)  ALPRAZolam (XANAX) tablet 0.5 mg (has no administration in time range)  traZODone (DESYREL) tablet 100 mg (has no administration in time range)  venlafaxine XR (EFFEXOR-XR) 24 hr capsule 75 mg (has no administration in time range)  docusate sodium (COLACE) capsule 100 mg (has no administration in time range)  pantoprazole (PROTONIX) EC tablet 40 mg (has no administration in time range)  vitamin B-12 (CYANOCOBALAMIN) tablet 250 mcg (has no administration in time range)  gabapentin (NEURONTIN) capsule 300 mg (has no administration in time range)  Cholecalciferol 1,000 Units (has no administration in time range)  atorvastatin (LIPITOR) tablet 40 mg (has no administration in time range)  0.9 %  sodium chloride infusion (has no administration in time range)  sodium chloride 0.9 % bolus 500 mL (500 mLs Intravenous New Bag/Given 03/31/19 0201)  fentaNYL (SUBLIMAZE) injection 50 mcg (50 mcg Intravenous Given 03/31/19 0201)  iohexol (OMNIPAQUE) 350 MG/ML injection 100 mL (100 mLs Intravenous Contrast Given 03/31/19 0211)  nitroGLYCERIN  (NITROGLYN) 2 % ointment 1 inch (1 inch Topical Given 03/31/19 0412)                                                                                                                                    Procedures .Critical Care Performed by: Fatima Blank, MD Authorized by: Fatima Blank, MD    Ultrasound ED Peripheral IV (Provider)  Date/Time: 03/31/2019 7:22 AM Performed by: Fatima Blank, MD Authorized by: Fatima Blank, MD   Procedure details:    Indications: multiple failed IV attempts     Skin Prep: chlorhexidine gluconate     Location:  Right AC   Angiocath:  20 G   Bedside Ultrasound Guided: Yes     Images: archived     Patient tolerated procedure without complications: Yes     Dressing applied: Yes     CRITICAL CARE Performed by: Grayce Sessions Becky Berberian Total critical care time: 50 minutes Critical care time was exclusive of separately billable procedures and treating other patients. Critical care was necessary to treat or prevent imminent or life-threatening deterioration. Critical care was time spent personally by me on the following activities: development of treatment plan with patient and/or surrogate as well as nursing, discussions with consultants, evaluation of patient's response to treatment, examination of patient, obtaining history from patient or surrogate, ordering and performing treatments and interventions, ordering and review of laboratory studies, ordering and review of radiographic studies, pulse oximetry and re-evaluation of patient's condition.   (including critical care time)  Medical Decision Making / ED Course I have reviewed the nursing notes for this encounter and the patient's prior records (if available in EHR or on provided paperwork).   [name] was evaluated in Emergency Department on 03/31/2019 for the symptoms described in the history of present illness. She was evaluated in the context of the  global COVID-19 pandemic, which necessitated consideration that the patient might be at risk for infection with the SARS-CoV-2 virus that causes COVID-19. Institutional protocols and algorithms that pertain to the evaluation of patients at risk for COVID-19 are in a state of rapid change based on information released by regulatory bodies including the CDC and federal and state organizations. These policies and algorithms were followed during the patient's care in the ED.  EKG with  new ST segment depressions in high lateral leads. Initial troponin mildly elevated at 41.  Has already obtain a delta troponin which increased by 5 points.  Dissection versus PE versus ACS.  CT angio negative for dissection or PE.  Pain improved with nitroglycerin.  Will admit to medicine for continued trending and likely stress test with cardiology consult.      Final Clinical Impression(s) / ED Diagnoses Final diagnoses:  Precordial chest pain  Elevated troponin      This chart was dictated using voice recognition software.  Despite best efforts to proofread,  errors can occur which can change the documentation meaning.     Fatima Blank, MD 03/31/19 [phone]

## 2019-03-31 NOTE — ED Notes (Signed)
Ordered a hospital bed per RN Jennifer--Yani Lal

## 2019-03-31 NOTE — Progress Notes (Signed)
Patient seen and examined.  She still in the ER.  She states that although she feels better but she sometimes still feels chest pressure.  No shortness of breath or other complaint.  General exam: Appears calm and comfortable  Respiratory system: Clear to auscultation. Respiratory effort normal. Cardiovascular system: S1 & S2 heard, RRR. No JVD, 4/6 systolic murmur, rubs, gallops or clicks. No pedal edema. Gastrointestinal system: Abdomen is nondistended, soft and nontender. No organomegaly or masses felt. Normal bowel sounds heard. Central nervous system: Alert and oriented. Skin: No rashes, lesions or ulcers.  Psychiatry: Judgement and insight appear normal. Mood & affect appropriate.   Seen by cardiology.  Plan to get transthoracic echo and eventually cardiac catheterization on Monday.  Has significant noticeable EKG changes, T wave inversion in lateral leads.  Troponin only slightly elevated which are flat.  Appreciate cardiology help.

## 2019-03-31 NOTE — Progress Notes (Signed)
[facility] for heparin Indication: chest pain/ACS  Allergies  Allergen Reactions  . Flagyl [Metronidazole] Anaphylaxis  . Amoxicillin Swelling  . Other Nausea Only    Tylenol with Codeine #3  . Percocet [Oxycodone-Acetaminophen] Nausea Only  . Plaquenil [Hydroxychloroquine Sulfate] Other (See Comments)    Blurred vision, joint pain  . Sulfa Antibiotics Nausea Only    Patient Measurements: Height: 5\' 8"  (172.7 cm) Weight: 250 lb 14.1 oz (113.8 kg) IBW/kg (Calculated) : 63.9 Heparin Dosing Weight: 89.9 kg  Vital Signs: Temp: 98.1 F (36.7 C) (10/17 1639) Temp Source: Oral (10/17 1639) BP: 106/64 (10/17 1639) Pulse Rate: 76 (10/17 1639)  Labs: Recent Labs    03/30/19 1858  03/31/19 0607 03/31/19 0850 03/31/19 1113 03/31/19 1154 03/31/19 2000  HGB 14.8  --   --   --   --   --   --   HCT 44.5  --   --   --   --   --   --   PLT 296  --   --   --   --   --   --   HEPARINUNFRC  --   --   --   --   --  0.15* 0.40  CREATININE 0.64  --   --   --   --   --   --   TROPONINIHS 41*   < > 50* 51* 49*  --   --    < > = values in this interval not displayed.    Estimated Creatinine Clearance: 92.9 mL/min (by C-G formula based on SCr of 0.64 mg/dL).  Assessment: 64 yof presented to the ED with CP. She was started on IV heparin and initial heparin level is low at 0.15. No bleeding noted.   Repeat heparin level now therapeutic.  Goal of Therapy:  Heparin level 0.3-0.7 units/ml Monitor platelets by anticoagulation protocol: Yes   Plan:  -Continue heparin 1350 units/hr -Daily heparin level and CBC   Arrie Senate, PharmD, BCPS Clinical Pharmacist [phone] Please check AMION for all [facility] numbers 03/31/2019

## 2019-03-31 NOTE — Progress Notes (Signed)
[facility] for heparin Indication: chest pain/ACS  Allergies  Allergen Reactions  . Flagyl [Metronidazole] Anaphylaxis  . Amoxicillin Swelling  . Other Nausea Only    Tylenol with Codeine #3  . Percocet [Oxycodone-Acetaminophen] Nausea Only  . Plaquenil [Hydroxychloroquine Sulfate] Other (See Comments)    Blurred vision, joint pain  . Sulfa Antibiotics Nausea Only    Patient Measurements: Height: 5\' 8"  (172.7 cm) Weight: 250 lb (113.4 kg) IBW/kg (Calculated) : 63.9 Heparin Dosing Weight: 89.9 kg  Vital Signs: BP: 104/66 (10/17 1105) Pulse Rate: 74 (10/17 1105)  Labs: Recent Labs    03/30/19 1858  03/31/19 0607 03/31/19 0850 03/31/19 1113 03/31/19 1154  HGB 14.8  --   --   --   --   --   HCT 44.5  --   --   --   --   --   PLT 296  --   --   --   --   --   HEPARINUNFRC  --   --   --   --   --  0.15*  CREATININE 0.64  --   --   --   --   --   TROPONINIHS 41*   < > 50* 51* 49*  --    < > = values in this interval not displayed.    Estimated Creatinine Clearance: 92.6 mL/min (by C-G formula based on SCr of 0.64 mg/dL).  Assessment: 89 yof presented to the ED with CP. She was started on IV heparin and initial heparin level is low at 0.15. No bleeding noted.   Goal of Therapy:  Heparin level 0.3-0.7 units/ml Monitor platelets by anticoagulation protocol: Yes   Plan:  Heparin bolus 2500 units IV x 1 Increase heparin gtt to 1350 units/hr Check a 6 hr heparin level Daily heparin level and CBC  Salome Arnt, PharmD, BCPS Please see AMION for all pharmacy numbers 03/31/2019 12:41 PM

## 2019-03-31 NOTE — ED Notes (Signed)
Contacted CT - pt is ready

## 2019-03-31 NOTE — ED Notes (Signed)
Patient transported to MRI 

## 2019-03-31 NOTE — ED Notes (Signed)
Patient transported to CT 

## 2019-03-31 NOTE — Progress Notes (Signed)
ANTICOAGULATION CONSULT NOTE - Initial Consult  Pharmacy Consult for heparin Indication: chest pain/ACS  Allergies  Allergen Reactions  . Flagyl [Metronidazole] Anaphylaxis  . Amoxicillin Swelling  . Other Nausea Only    Tylenol with Codeine #3  . Percocet [Oxycodone-Acetaminophen] Nausea Only  . Plaquenil [Hydroxychloroquine Sulfate] Other (See Comments)    Blurred vision, joint pain  . Sulfa Antibiotics Nausea Only    Patient Measurements: Height: 5\' 8"  (172.7 cm) Weight: 250 lb (113.4 kg) IBW/kg (Calculated) : 63.9 Heparin Dosing Weight: 89.9 kg  Vital Signs: Temp: 98 F (36.7 C) (10/17 0013) Temp Source: Oral (10/17 0013) BP: 138/74 (10/17 0400) Pulse Rate: 67 (10/17 0400)  Labs: Recent Labs    03/30/19 1858 03/30/19 2134  HGB 14.8  --   HCT 44.5  --   PLT 296  --   CREATININE 0.64  --   TROPONINIHS 41* 46*    Estimated Creatinine Clearance: 92.6 mL/min (by C-G formula based on SCr of 0.64 mg/dL).   Medical History: Past Medical History:  Diagnosis Date  . Anxiety   . Arthritis    low back  . Cervical pain (neck)   . Connective tissue disease ([place])    questionable Lupus (she has yet to see a rheumatologist)  . Depression   . Diverticulitis   . GERD (gastroesophageal reflux disease)   . History of fracture of left hip   . Hyperlipidemia   . Insomnia   . Peripheral edema   . Post-polio syndrome    With left leg paralysis  . Post-viral reactive airway disease   . Postmenopausal   . Sigmoid diverticulosis     Medications:  See medication history  Assessment: 63 yo lady to start heparin for CP.  Hg 14.8, PTLC 296 Goal of Therapy:  Heparin level 0.3-0.7 units/ml Monitor platelets by anticoagulation protocol: Yes   Plan:  Heparin bolus 4000 units and drip at 1100 units/hr Check heparin level 6-8 hours after start Daily HL and CBC while on heparin Monitor for bleeding complications  Thanks for allowing pharmacy to be a part of this  patient's care.  Excell Seltzer, PharmD Clinical Pharmacist 03/31/2019,5:03 AM

## 2019-03-31 NOTE — Consult Note (Addendum)
Cardiology Consultation:   Patient ID: [name] MRN: [id number]; DOB: September 01, 1955  Admit date: 03/30/2019 Date of Consult: 03/31/2019  Primary Care Provider: [facility] Primary Cardiologist: Minus Breeding, MD Remotely saw Dr. Percival Spanish in 2015  Primary Electrophysiologist:  None    Patient Profile:   [name] is a 63 y.o. female with a hx of mild aortic stenosis, HLD, GERD, wheelchair bound due to hx of polio, depression, and anxiety who is being seen today for the evaluation of chest pain at the request of DR Pahwani.  History of Present Illness:   [name] with above hx including mild aortic stenosis on echo 2015 presented to ER last evening with chest pain.  Pain began Thursday night -substernal, moderate and tight feeling.  Mild SOB originally.   Stated she was talking on the phone and had sharp pain from back to chest and into jaw, sharp and a tightness/heaviness.  She almost called 911, but after 5 min or so pain improved but Heaviness/tightness was still present. She was able to sleep but some discomfort on waking and saw her PCP.   No cough or fever.  Pt. went to PCP Friday and had abnormal EKG and sent to ER.   She has had some dizziness lightheadedness lately.  No palpitations, no rapid HR.   EKG:  The EKG was personally reviewed and demonstrates:  SR with LVH, Twave inversion I, II, AVL, and V4-6, the V4-6 changes are new and todays EKG with Deeper T wave inversions. Telemetry:  Telemetry was personally reviewed and demonstrates:  SR Troponin 41, 46, 50  Na 133, k+ 4.1 Cr 0,64 hgb 14.8, WBC 7.7, plts 296  hgb a1c 5.6 TChol 201, LDL 138, HDL 44  COVID pending 2V CXR NAD CT angio of chest:    IMPRESSION: 1. No aortic dissection or acute aortic abnormality. Mild thoracoabdominal aortic atherosclerosis. Aortic valvular calcifications, consider further evaluation with echocardiogram. 2. No acute abnormality in the chest, abdomen,  or pelvis. 3. Incidental colonic diverticulosis without diverticulitis. Small hiatal hernia. 4. Right abdominal wall hernia repair with mesh. Fat containing umbilical hernia.  Currently with mild discomfort, IV heparin infusing.  She did not want any morphine.  BP 135/67 to 96/52 P in 60s   Heart Pathway Score:     Past Medical History:  Diagnosis Date  . Anxiety   . Arthritis    low back  . Cervical pain (neck)   . Connective tissue disease ([place])    questionable Lupus (she has yet to see a rheumatologist)  . Depression   . Diverticulitis   . GERD (gastroesophageal reflux disease)   . History of fracture of left hip   . Hyperlipidemia   . Insomnia   . Peripheral edema   . Post-polio syndrome    With left leg paralysis  . Post-viral reactive airway disease   . Postmenopausal   . Sigmoid diverticulosis     Past Surgical History:  Procedure Laterality Date  . ANKLE FUSION     left  . APPENDECTOMY    . CHOLECYSTECTOMY    . KNEE ARTHROPLASTY    . LEG SURGERY     muscle surgery related to polio  . TOTAL HIP ARTHROPLASTY     right x 3  . UMBILICAL HERNIA REPAIR       Home Medications:  Prior to Admission medications   Medication Sig Start Date End Date Taking? Authorizing Provider  ALPRAZolam Duanne Moron) 0.5 MG tablet  Take 0.5 mg by mouth 2 (two) times daily as needed for sleep.    Yes [provider]  Cholecalciferol (D3 HIGH POTENCY) 1000 UNITS capsule Take 1,000 Units by mouth daily.   Yes [provider]  docusate sodium (COLACE) 100 MG capsule Take 100 mg by mouth daily.   Yes [provider]  gabapentin (NEURONTIN) 300 MG capsule Take 300 mg by mouth daily.   Yes [provider]  pantoprazole (PROTONIX) 40 MG tablet Take 40 mg by mouth daily.   Yes [provider]  traZODone (DESYREL) 100 MG tablet Take 100 mg by mouth at bedtime.    Yes [provider]  venlafaxine XR (EFFEXOR-XR) 75 MG 24 hr capsule Take 75 mg  by mouth daily with breakfast.   Yes [provider]  vitamin B-12 (CYANOCOBALAMIN) 250 MCG tablet Take 250 mcg by mouth daily.   Yes [provider]  benzonatate (TESSALON) 100 MG capsule Take 1 capsule (100 mg total) by mouth every 8 (eight) hours. Patient not taking: Reported on 03/31/2019 09/25/17   Horton, Barbette Hair, MD  loratadine (CLARITIN) 10 MG tablet Take 1 tablet (10 mg total) by mouth daily. Patient not taking: Reported on 03/31/2019 09/25/17   Horton, Barbette Hair, MD  oxymetazoline (AFRIN NASAL SPRAY) 0.05 % nasal spray Place 1 spray into both nostrils 2 (two) times daily. Patient not taking: Reported on 03/31/2019 09/25/17   Horton, Barbette Hair, MD  sodium chloride (OCEAN) 0.65 % SOLN nasal spray Place 1 spray into both nostrils as needed for congestion. Patient not taking: Reported on 03/31/2019 09/25/17   Merryl Hacker, MD    Inpatient Medications: Scheduled Meds: . atorvastatin  40 mg Oral q1800  . cholecalciferol  1,000 Units Oral Daily  . docusate sodium  100 mg Oral Daily  . gabapentin  300 mg Oral Daily  . pantoprazole  40 mg Oral Daily  . sodium chloride flush  3 mL Intravenous Once  . traZODone  100 mg Oral QHS  . venlafaxine XR  75 mg Oral Q breakfast  . vitamin B-12  250 mcg Oral Daily   Continuous Infusions: . sodium chloride 75 mL/hr at 03/31/19 0535  . heparin 1,100 Units/hr (03/31/19 0542)   PRN Meds: acetaminophen, ALPRAZolam, morphine injection, nitroGLYCERIN, ondansetron (ZOFRAN) IV  Allergies:    Allergies  Allergen Reactions  . Flagyl [Metronidazole] Anaphylaxis  . Amoxicillin Swelling  . Other Nausea Only    Tylenol with Codeine #3  . Percocet [Oxycodone-Acetaminophen] Nausea Only  . Plaquenil [Hydroxychloroquine Sulfate] Other (See Comments)    Blurred vision, joint pain  . Sulfa Antibiotics Nausea Only    Social History:   Social History   Socioeconomic History  . Marital status: Single    Spouse name: Not on file   . Number of children: Not on file  . Years of education: Not on file  . Highest education level: Not on file  Occupational History  . Not on file  Social Needs  . Financial resource strain: Not on file  . Food insecurity    Worry: Not on file    Inability: Not on file  . Transportation needs    Medical: Not on file    Non-medical: Not on file  Tobacco Use  . Smoking status: Never Smoker  . Smokeless tobacco: Never Used  Substance and Sexual Activity  . Alcohol use: No  . Drug use: Not on file  . Sexual activity: Not on file  Lifestyle  .  Physical activity    Days per week: Not on file    Minutes per session: Not on file  . Stress: Not on file  Relationships  . Social Herbalist on phone: Not on file    Gets together: Not on file    Attends religious service: Not on file    Active member of club or organization: Not on file    Attends meetings of clubs or organizations: Not on file    Relationship status: Not on file  . Intimate partner violence    Fear of current or ex partner: Not on file    Emotionally abused: Not on file    Physically abused: Not on file    Forced sexual activity: Not on file  Other Topics Concern  . Not on file  Social History Narrative   Lives alone.    Family History:    Family History  Problem Relation Age of Onset  . Cancer Mother   . Heart disease Father   . CAD Other        FAM Hx OF CAD  . Diabetes Other        FAM Hx of DM  . Hypertension Other      ROS:  Please see the history of present illness.  General:no colds or fevers, no weight changes Skin:no rashes or ulcers HEENT:no blurred vision, no congestion CV:see HPI PUL:see HPI GI:no diarrhea constipation or melena, no indigestion GU:no hematuria, no dysuria MS:no joint pain, no claudication Neuro:no syncope, no lightheadedness Endo:no diabetes, no thyroid disease  All other ROS reviewed and negative.     Physical Exam/Data:   Vitals:   03/31/19 0500  03/31/19 0530 03/31/19 0630 03/31/19 0700  BP: 121/71 135/67 110/63 (!) 111/41  Pulse: 72 67 67 66  Resp:    (!) 22  Temp:      TempSrc:      SpO2: 98% 95% 95% 95%  Weight:      Height:        Intake/Output Summary (Last 24 hours) at 03/31/2019 0948 Last data filed at 03/31/2019 0011 Gross per 24 hour  Intake 0 ml  Output 0 ml  Net 0 ml   Last 3 Weights 03/31/2019 02/18/2019 09/25/2017  Weight (lbs) 250 lb 250 lb 250 lb  Weight (kg) 113.399 kg 113.399 kg 113.399 kg     Body mass index is 38.01 kg/m.  General:  Well nourished, well developed, in no acute distress though continues with discomfort  HEENT: normal Lymph: no adenopathy Neck: no JVD Endocrine:  No thryomegaly Vascular: No carotid bruits; pedal pulses 2+ bilaterallyCardiac:  normal S1, S2; RRR; [id number] systolic murmur no gallup or rub Lungs:  clear to auscultation bilaterally, no wheezing, rhonchi or rales  Abd: soft, nontender, no hepatomegaly  Ext: some edema of feet  Musculoskeletal:  Some foot drop, lower ext weakness due to post polio weakness Skin: warm and dry  Neuro: alert and oriented X 3 MAE, follows commands  no focal abnormalities noted Psych:  Normal affect     Relevant CV Studies: Echo 2015  The study  was technically difficult, as a result of poor acoustic windows,  poor sound wave transmission, restricted patient mobility, and  body habitus.  - Left ventricle: The cavity size was normal. There was moderate  concentric hypertrophy. Systolic function was normal. The  estimated ejection fraction was in the range of 55% to 60%.  Doppler parameters are consistent with abnormal left ventricular  relaxation (grade 1 diastolic dysfunction). The E/e&' ratio is  between 8-15, suggesting indetermine LV filling pressure.  - Aortic valve: Calcified with restricted leaflet motion. Doppler  envelopes were challenging - the peak and mean gradients are at  least 38 and 24 mmHg, however,  LVOT diameter measures 2.2 cm,  therefore the calculatd AVA is 1.8 cm2, consistent with mild  aortic stenosis. Valve area (VTI): 1.82 cm^2. Valve area (Vmax):  1.8 cm^2.  - Mitral valve: Calcified annulus.  - Left atrium: The atrium was at the upper limits of normal in  size.   Impressions:   - Technically difficult study - probably mild to moderate aortic  stenosis. Normal systolic function, diastolic dysfunction.   Laboratory Data:  High Sensitivity Troponin:   Recent Labs  Lab 03/30/19 1858 03/30/19 2134 03/31/19 0607 03/31/19 0850  TROPONINIHS 41* 46* 50* 51*     Chemistry Recent Labs  Lab 03/30/19 1858  NA 133*  K 4.1  CL 98  CO2 25  GLUCOSE 103*  BUN 13  CREATININE 0.64  CALCIUM 9.1  GFRNONAA >60  GFRAA >60  ANIONGAP 10    No results for input(s): PROT, ALBUMIN, AST, ALT, ALKPHOS, BILITOT in the last 168 hours. Hematology Recent Labs  Lab 03/30/19 1858  WBC 7.7  RBC 4.69  HGB 14.8  HCT 44.5  MCV 94.9  MCH 31.6  MCHC 33.3  RDW 13.2  PLT 296   BNPNo results for input(s): BNP, PROBNP in the last 168 hours.  DDimer No results for input(s): DDIMER in the last 168 hours.   Radiology/Studies:  Dg Chest 2 View  Result Date: 03/30/2019 CLINICAL DATA:  Acute chest pain for 1 day. EXAM: CHEST - 2 VIEW COMPARISON:  09/25/2017 FINDINGS: The cardiomediastinal silhouette is unremarkable. There is no evidence of focal airspace disease, pulmonary edema, suspicious pulmonary nodule/mass, pleural effusion, or pneumothorax. No acute bony abnormalities are identified. IMPRESSION: No active cardiopulmonary disease. Electronically Signed   By: Margarette Canada M.D.   On: 03/30/2019 19:28   Ct Angio Chest/abd/pel For Dissection W And/or Wo Contrast  Result Date: 03/31/2019 CLINICAL DATA:  Chest/back pain, acute, aortic dissection suspect EXAM: CT ANGIOGRAPHY CHEST, ABDOMEN AND PELVIS TECHNIQUE: Multidetector CT imaging through the chest, abdomen and pelvis was  performed using the standard protocol during bolus administration of intravenous contrast. Multiplanar reconstructed images and MIPs were obtained and reviewed to evaluate the vascular anatomy. CONTRAST:  116mL OMNIPAQUE IOHEXOL 350 MG/ML SOLN COMPARISON:  Chest radiograph yesterday. Abdominopelvic CT 03/31/2016 FINDINGS: CTA CHEST FINDINGS Cardiovascular: Thoracic aorta is normal in caliber. No aortic dissection, acute aortic syndrome or aneurysm. Conventional branching pattern from the aortic arch. Aortic valvular calcifications. No central pulmonary embolus to the lobar level. Heart is normal in size. No pericardial effusion. Mediastinum/Nodes: Calcified right hilar nodes consistent with prior granulomatous disease. No suspicious noncalcified adenopathy. Subcentimeter left thyroid nodule does not meet size criteria for further evaluation. Patulous esophagus. Small hiatal hernia. Lungs/Pleura: Heterogeneous pulmonary parenchyma. 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 Clustered nodules in the subpleural right middle lobe, series 8, image 104, chronic and unchanged from 2017 abdominal CT. No pulmonary edema. No pleural fluid. The trachea and mainstem bronchi are patent. Musculoskeletal: Mild multilevel degenerative change in the spine. There are no acute or suspicious osseous abnormalities. Degenerative change in the right shoulder. Review of the MIP images confirms the above findings. CTA ABDOMEN AND PELVIS FINDINGS VASCULAR Aorta: Normal caliber aorta without  aneurysm, dissection, vasculitis or significant stenosis. Mild atherosclerosis. Celiac: Patent without evidence of aneurysm, dissection, vasculitis or significant stenosis. SMA: Patent without evidence of aneurysm, dissection, vasculitis or significant stenosis. Renals: Both renal arteries are patent without evidence of aneurysm, dissection, vasculitis, fibromuscular dysplasia or  significant stenosis. IMA: Patent without evidence of aneurysm, dissection, vasculitis or significant stenosis. Inflow: Patent without evidence of aneurysm, dissection, vasculitis or significant stenosis. Veins: Limited assessment on this arterial phase exam. Review of the MIP images confirms the above findings. NON-VASCULAR Hepatobiliary: No focal liver abnormality is seen. Status post cholecystectomy. No biliary dilatation. Pancreas: No ductal dilatation or inflammation. Spleen: Normal in size and arterial enhancement. Adrenals/Urinary Tract: No adrenal nodule. Suggestion of slight left renal atrophy. No hydronephrosis. Tiny cortical hypodensities in the left kidney are too small to characterize. Urinary bladder partially distended. No bladder wall thickening. Stomach/Bowel: Small hiatal hernia. Stomach is decompressed and not well assessed. No small bowel wall thickening or inflammatory change. Prior appendectomy with surgical clips in the right lower quadrant. Small volume of stool throughout the colon. Colonic diverticulosis from the splenic flexure distally, prominent in the sigmoid. No evidence of diverticulitis. Lymphatic: No abdominopelvic adenopathy. Reproductive: Status post hysterectomy. No adnexal masses. Other: Postsurgical change of the right anterior abdominal wall with mesh. Fat containing umbilical hernia. No free air or free fluid. Musculoskeletal: Right hip arthroplasty. Degenerative change in the spine with multilevel endplate changes. There are no acute or suspicious osseous abnormalities. Review of the MIP images confirms the above findings. IMPRESSION: 1. No aortic dissection or acute aortic abnormality. Mild thoracoabdominal aortic atherosclerosis. Aortic valvular calcifications, consider further evaluation with echocardiogram. 2. No acute abnormality in the chest, abdomen, or pelvis. 3. Incidental colonic diverticulosis without diverticulitis. Small hiatal hernia. 4. Right abdominal wall  hernia repair with mesh. Fat containing umbilical hernia. Aortic Atherosclerosis (ICD10-I70.0). Electronically Signed   By: Keith Rake M.D.   On: 03/31/2019 02:42    Assessment and Plan:   1. Chest pain with abnormal EKG with T wave inversion in lat leads that are new.  Troponin pk 46.  Still with ongoing chest heaviness. ASA added and on IV heparin - she is NPO will allow to eat and plan cardiac cath on Monday.  2. Mld AS on prior echo will recheck Echo  3. HLD added statin, recheck in 6-8 weeks 4. Post polio syndrome wheelchair bound. 5. Neg CTA for PE or dissection.      For questions or updates, please contact [place] Please consult [email or web address] for contact info under     Signed, Cecilie Kicks, NP 03/31/2019 9:48 AM     Attending note:  Patient seen and examined.  I reviewed her records and discussed the case with Ms. Ingold NP.  Ms. Vanblaricum has a history of mild aortic stenosis as of 2015, hyperlipidemia, GERD, and post polio syndrome, uses a wheelchair.  She presents to the hospital describing recent onset chest tightness at rest associated with shortness of breath and radiating into the back and jaw.  Symptoms are new onset within the last few days.  On examination she appears comfortable.  Heart rate is in the 70s in sinus rhythm by telemetry which I personally reviewed.  Systolic blood pressure ranging high 90s to 140s.  Lungs are clear.  Cardiac exam reveals RRR with 2/6 to 3/6 systolic murmur.  Lab work shows potassium 4.1, BUN 13, creatinine 0.64, high-sensitivity troponin increasing from 41-51, LDL 138, hemoglobin 14.8, platelets 296.  CT angiography of the  chest/abdomen/pelvis showed no acute findings.  Incidentally noted thoracoabdominal atherosclerosis and valvular calcifications.  ECG today shows sinus rhythm with high lateral and anterolateral ST-T wave abnormalities, more prominent anterolaterally compared to last tracings.  Patient presents with  chest pain concerning for unstable angina with recent onset at rest.  ECG does show progressive ST-T wave abnormalities in the anterolateral leads but high-sensitivity troponin I levels are in nondiagnostic range for ACS.  With history of aortic stenosis a follow-up echocardiogram will be obtained.  Suspect she will ultimately proceed to diagnostic cardiac catheterization on Monday for definitive assessment of coronary anatomy.  We will continue to follow.  Satira Sark, M.D., F.A.C.C.

## 2019-04-01 DIAGNOSIS — G8314 Monoplegia of lower limb affecting left nondominant side: Secondary | ICD-10-CM | POA: Diagnosis present

## 2019-04-01 DIAGNOSIS — Z888 Allergy status to other drugs, medicaments and biological substances status: Secondary | ICD-10-CM | POA: Diagnosis not present

## 2019-04-01 DIAGNOSIS — G14 Postpolio syndrome: Secondary | ICD-10-CM | POA: Diagnosis present

## 2019-04-01 DIAGNOSIS — I200 Unstable angina: Secondary | ICD-10-CM

## 2019-04-01 DIAGNOSIS — I214 Non-ST elevation (NSTEMI) myocardial infarction: Secondary | ICD-10-CM | POA: Diagnosis present

## 2019-04-01 DIAGNOSIS — K5730 Diverticulosis of large intestine without perforation or abscess without bleeding: Secondary | ICD-10-CM | POA: Diagnosis present

## 2019-04-01 DIAGNOSIS — Z96641 Presence of right artificial hip joint: Secondary | ICD-10-CM | POA: Diagnosis present

## 2019-04-01 DIAGNOSIS — E785 Hyperlipidemia, unspecified: Secondary | ICD-10-CM | POA: Diagnosis not present

## 2019-04-01 DIAGNOSIS — Z20828 Contact with and (suspected) exposure to other viral communicable diseases: Secondary | ICD-10-CM | POA: Diagnosis present

## 2019-04-01 DIAGNOSIS — I5043 Acute on chronic combined systolic (congestive) and diastolic (congestive) heart failure: Secondary | ICD-10-CM | POA: Diagnosis not present

## 2019-04-01 DIAGNOSIS — Z993 Dependence on wheelchair: Secondary | ICD-10-CM | POA: Diagnosis not present

## 2019-04-01 DIAGNOSIS — E669 Obesity, unspecified: Secondary | ICD-10-CM | POA: Diagnosis present

## 2019-04-01 DIAGNOSIS — Z882 Allergy status to sulfonamides status: Secondary | ICD-10-CM | POA: Diagnosis not present

## 2019-04-01 DIAGNOSIS — I350 Nonrheumatic aortic (valve) stenosis: Secondary | ICD-10-CM

## 2019-04-01 DIAGNOSIS — Z6841 Body Mass Index (BMI) 40.0 and over, adult: Secondary | ICD-10-CM | POA: Diagnosis not present

## 2019-04-01 DIAGNOSIS — F418 Other specified anxiety disorders: Secondary | ICD-10-CM | POA: Diagnosis present

## 2019-04-01 DIAGNOSIS — M47816 Spondylosis without myelopathy or radiculopathy, lumbar region: Secondary | ICD-10-CM | POA: Diagnosis present

## 2019-04-01 DIAGNOSIS — Z881 Allergy status to other antibiotic agents status: Secondary | ICD-10-CM | POA: Diagnosis not present

## 2019-04-01 DIAGNOSIS — Z981 Arthrodesis status: Secondary | ICD-10-CM | POA: Diagnosis not present

## 2019-04-01 DIAGNOSIS — Z01810 Encounter for preprocedural cardiovascular examination: Secondary | ICD-10-CM | POA: Diagnosis not present

## 2019-04-01 DIAGNOSIS — R079 Chest pain, unspecified: Secondary | ICD-10-CM | POA: Diagnosis not present

## 2019-04-01 DIAGNOSIS — K219 Gastro-esophageal reflux disease without esophagitis: Secondary | ICD-10-CM | POA: Diagnosis present

## 2019-04-01 DIAGNOSIS — Z809 Family history of malignant neoplasm, unspecified: Secondary | ICD-10-CM | POA: Diagnosis not present

## 2019-04-01 DIAGNOSIS — Z8249 Family history of ischemic heart disease and other diseases of the circulatory system: Secondary | ICD-10-CM | POA: Diagnosis not present

## 2019-04-01 DIAGNOSIS — Z885 Allergy status to narcotic agent status: Secondary | ICD-10-CM | POA: Diagnosis not present

## 2019-04-01 DIAGNOSIS — R072 Precordial pain: Secondary | ICD-10-CM | POA: Diagnosis present

## 2019-04-01 DIAGNOSIS — Z96659 Presence of unspecified artificial knee joint: Secondary | ICD-10-CM | POA: Diagnosis present

## 2019-04-01 DIAGNOSIS — G4700 Insomnia, unspecified: Secondary | ICD-10-CM | POA: Diagnosis present

## 2019-04-01 LAB — CBC
HCT: 41.1 % (ref 36.0–46.0)
Hemoglobin: 14 g/dL (ref 12.0–15.0)
MCH: 31.7 pg (ref 26.0–34.0)
MCHC: 34.1 g/dL (ref 30.0–36.0)
MCV: 93 fL (ref 80.0–100.0)
Platelets: 273 10*3/uL (ref 150–400)
RBC: 4.42 MIL/uL (ref 3.87–5.11)
RDW: 13.1 % (ref 11.5–15.5)
WBC: 11.3 10*3/uL — ABNORMAL HIGH (ref 4.0–10.5)
nRBC: 0 % (ref 0.0–0.2)

## 2019-04-01 LAB — HEPARIN LEVEL (UNFRACTIONATED): Heparin Unfractionated: 0.59 IU/mL (ref 0.30–0.70)

## 2019-04-01 MED ORDER — ASPIRIN 81 MG PO CHEW
81.0000 mg | CHEWABLE_TABLET | ORAL | Status: AC
  Administered 2019-04-02: 81 mg via ORAL
  Filled 2019-04-01: qty 1

## 2019-04-01 MED ORDER — SODIUM CHLORIDE 0.9 % IV SOLN: 250.0000 mL | INTRAVENOUS | Status: DC | PRN

## 2019-04-01 MED ORDER — SODIUM CHLORIDE 0.9 % WEIGHT BASED INFUSION
3.0000 mL/kg/h | INTRAVENOUS | Status: DC
  Administered 2019-04-02: 3 mL/kg/h via INTRAVENOUS

## 2019-04-01 MED ORDER — SODIUM CHLORIDE 0.9% FLUSH: 3.0000 mL | Freq: Two times a day (BID) | INTRAVENOUS | Status: DC

## 2019-04-01 MED ORDER — SODIUM CHLORIDE 0.9 % WEIGHT BASED INFUSION
1.0000 mL/kg/h | INTRAVENOUS | Status: DC
  Administered 2019-04-02: 1 mL/kg/h via INTRAVENOUS

## 2019-04-01 MED ORDER — SODIUM CHLORIDE 0.9% FLUSH: 3.0000 mL | INTRAVENOUS | Status: DC | PRN

## 2019-04-01 NOTE — Progress Notes (Signed)
PROGRESS NOTE    [name]  [id number] DOB: 10/22/1955 DOA: 03/30/2019 PCP: [facility]   Brief Narrative:  [name] is a 63 y.o. female with medical history significant of HLD, GERD, depression with anxiety, diverticulitis, wheelchair bond due to hx of polio, obesity,  who presented with chest pain on 03/31/2019.  The pain has been ongoing since Thursday..  It is substernal, pressure-like and radiating to the mid back.  No shortness of breath or any other complaint.  Patient was seen by PCP Friday afternoon, and had EKG in office which was abnormal, therefore patient was sent o ED for further evaluation treatment.   Upon arrival to ED, pt was found to have troponin 41, 46, WBC 7.1, negative COVID-19 test, electrolytes renal function okay, temperature 99.1, blood pressure 138/74, heart rate 67, oxygen saturation 99% on room air.  Chest x-ray negative.  CT angiogram is negative for dissection and central PE.    Patient was admitted to hospital service and cardiology consulted.  Assessment & Plan:   Principal Problem:   Chest pain Active Problems:   NSTEMI (non-ST elevated myocardial infarction) ([place])   Hyperlipidemia   GERD (gastroesophageal reflux disease)   Depression with anxiety  Chest pain/unstable angina/severe aortic stenosis: Repeat echo once again shows severe aortic stenosis.  Troponin mildly elevated and flat does not indicate ACS.  Her pain could be due to aortic stenosis versus unstable angina.  Cardiology on board.  Plan for likely heart catheterization tomorrow.  She was also started on heparin drip.  Her chest pain has resolved since she has been placed on Nitropatch last night.  Appreciate cardiology help.  Hyperlipidemia: Continue Lipitor  GERD: Continue PPI  Depression with anxiety: Continue Xanax and Effexor.  DVT prophylaxis: Heparin drip Code Status: Full code Family Communication:  None present at bedside.  Plan  of care discussed with patient in length and he verbalized understanding and agreed with it. Disposition Plan: TBD  Estimated body mass index is 39.84 kg/m as calculated from the following:   Height as of this encounter: 5\' 8"  (1.727 m).   Weight as of this encounter: 118.8 kg.      Nutritional status:               Consultants:   Cardiology  Procedures:   None  Antimicrobials:   None   Subjective: Patient seen and examined.  She has no more chest pain since she has been placed on Nitropatch.  No other complaint today.  Objective: Vitals:   03/31/19 1639 03/31/19 2127 04/01/19 0149 04/01/19 0530  BP: 106/64 (!) 108/59 128/73 107/61  Pulse: 76 81 79 78  Resp:  16 16 16   Temp: 98.1 F (36.7 C) 97.9 F (36.6 C) 98.2 F (36.8 C) 98.5 F (36.9 C)  TempSrc: Oral Oral Oral Oral  SpO2: 98% 97% 97% 92%  Weight:    118.8 kg  Height:        Intake/Output Summary (Last 24 hours) at 04/01/2019 1024 Last data filed at 04/01/2019 0900 Gross per 24 hour  Intake 1846.04 ml  Output 650 ml  Net 1196.04 ml   Filed Weights   03/31/19 0400 03/31/19 1334 04/01/19 0530  Weight: 113.4 kg 113.8 kg 118.8 kg    Examination:  General exam: Appears calm and comfortable  Respiratory system: Clear to auscultation. Respiratory effort normal. Cardiovascular system: S1 & S2 heard, RRR. No JVD, murmurs, rubs, gallops or clicks. No pedal edema. Gastrointestinal  system: Abdomen is nondistended, soft and nontender. No organomegaly or masses felt. Normal bowel sounds heard. Central nervous system: Alert and oriented.  Paraplegia Extremities: Paraplegia Skin: No rashes, lesions or ulcers Psychiatry: Judgement and insight appear normal. Mood & affect appropriate.    Data Reviewed: I have personally reviewed following labs and imaging studies  CBC: Recent Labs  Lab 03/30/19 1858 04/01/19 0601  WBC 7.7 11.3*  HGB 14.8 14.0  HCT 44.5 41.1  MCV 94.9 93.0  PLT 296 [id number]    Basic Metabolic Panel: Recent Labs  Lab 03/30/19 1858  NA 133*  K 4.1  CL 98  CO2 25  GLUCOSE 103*  BUN 13  CREATININE 0.64  CALCIUM 9.1   GFR: Estimated Creatinine Clearance: 95.1 mL/min (by C-G formula based on SCr of 0.64 mg/dL). Liver Function Tests: No results for input(s): AST, ALT, ALKPHOS, BILITOT, PROT, ALBUMIN in the last 168 hours. No results for input(s): LIPASE, AMYLASE in the last 168 hours. No results for input(s): AMMONIA in the last 168 hours. Coagulation Profile: No results for input(s): INR, PROTIME in the last 168 hours. Cardiac Enzymes: No results for input(s): CKTOTAL, CKMB, CKMBINDEX, TROPONINI in the last 168 hours. BNP (last 3 results) No results for input(s): PROBNP in the last 8760 hours. HbA1C: Recent Labs    03/31/19 0607  HGBA1C 5.6   CBG: No results for input(s): GLUCAP in the last 168 hours. Lipid Profile: Recent Labs    03/31/19 0607  CHOL 201*  HDL 44  LDLCALC 138*  TRIG 93  CHOLHDL 4.6   Thyroid Function Tests: No results for input(s): TSH, T4TOTAL, FREET4, T3FREE, THYROIDAB in the last 72 hours. Anemia Panel: No results for input(s): VITAMINB12, FOLATE, FERRITIN, TIBC, IRON, RETICCTPCT in the last 72 hours. Sepsis Labs: No results for input(s): PROCALCITON, LATICACIDVEN in the last 168 hours.  Recent Results (from the past 240 hour(s))  SARS CORONAVIRUS 2 (TAT 6-24 HRS) Nasopharyngeal Nasopharyngeal Swab     Status: None   Collection Time: 03/31/19  4:35 AM   Specimen: Nasopharyngeal Swab  Result Value Ref Range Status   SARS Coronavirus 2 NEGATIVE NEGATIVE Final    Comment: (NOTE) SARS-CoV-2 target nucleic acids are NOT DETECTED. The SARS-CoV-2 RNA is generally detectable in upper and lower respiratory specimens during the acute phase of infection. Negative results do not preclude SARS-CoV-2 infection, do not rule out co-infections with other pathogens, and should not be used as the sole basis for treatment or other  patient management decisions. Negative results must be combined with clinical observations, patient history, and epidemiological information. The expected result is Negative. Fact Sheet for Patients: SugarRoll.be Fact Sheet for Healthcare Providers: https://www.woods-mathews.com/ This test is not yet approved or cleared by the [place] FDA and  has been authorized for detection and/or diagnosis of SARS-CoV-2 by FDA under an Emergency Use Authorization (EUA). This EUA will remain  in effect (meaning this test can be used) for the duration of the COVID-19 declaration under Section 56 4(b)(1) of the Act, 21 U.S.C. section 360bbb-3(b)(1), unless the authorization is terminated or revoked sooner. Performed at [facility], [place] [address] [place], [place] [address]       Radiology Studies: Dg Chest 2 View  Result Date: 03/30/2019 CLINICAL DATA:  Acute chest pain for 1 day. EXAM: CHEST - 2 VIEW COMPARISON:  09/25/2017 FINDINGS: The cardiomediastinal silhouette is unremarkable. There is no evidence of focal airspace disease, pulmonary edema, suspicious pulmonary nodule/mass, pleural effusion, or pneumothorax. No  acute bony abnormalities are identified. IMPRESSION: No active cardiopulmonary disease. Electronically Signed   By: Margarette Canada M.D.   On: 03/30/2019 19:28   Ct Angio Chest/abd/pel For Dissection W And/or Wo Contrast  Result Date: 03/31/2019 CLINICAL DATA:  Chest/back pain, acute, aortic dissection suspect EXAM: CT ANGIOGRAPHY CHEST, ABDOMEN AND PELVIS TECHNIQUE: Multidetector CT imaging through the chest, abdomen and pelvis was performed using the standard protocol during bolus administration of intravenous contrast. Multiplanar reconstructed images and MIPs were obtained and reviewed to evaluate the vascular anatomy. CONTRAST:  140mL OMNIPAQUE IOHEXOL 350 MG/ML SOLN COMPARISON:  Chest radiograph yesterday. Abdominopelvic CT  03/31/2016 FINDINGS: CTA CHEST FINDINGS Cardiovascular: Thoracic aorta is normal in caliber. No aortic dissection, acute aortic syndrome or aneurysm. Conventional branching pattern from the aortic arch. Aortic valvular calcifications. No central pulmonary embolus to the lobar level. Heart is normal in size. No pericardial effusion. Mediastinum/Nodes: Calcified right hilar nodes consistent with prior granulomatous disease. No suspicious noncalcified adenopathy. Subcentimeter left thyroid nodule does not meet size criteria for further evaluation. Patulous esophagus. Small hiatal hernia. Lungs/Pleura: Heterogeneous pulmonary parenchyma. 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 Clustered nodules in the subpleural right middle lobe, series 8, image 104, chronic and unchanged from 2017 abdominal CT. No pulmonary edema. No pleural fluid. The trachea and mainstem bronchi are patent. Musculoskeletal: Mild multilevel degenerative change in the spine. There are no acute or suspicious osseous abnormalities. Degenerative change in the right shoulder. Review of the MIP images confirms the above findings. CTA ABDOMEN AND PELVIS FINDINGS VASCULAR Aorta: Normal caliber aorta without aneurysm, dissection, vasculitis or significant stenosis. Mild atherosclerosis. Celiac: Patent without evidence of aneurysm, dissection, vasculitis or significant stenosis. SMA: Patent without evidence of aneurysm, dissection, vasculitis or significant stenosis. Renals: Both renal arteries are patent without evidence of aneurysm, dissection, vasculitis, fibromuscular dysplasia or significant stenosis. IMA: Patent without evidence of aneurysm, dissection, vasculitis or significant stenosis. Inflow: Patent without evidence of aneurysm, dissection, vasculitis or significant stenosis. Veins: Limited assessment on this arterial phase exam. Review of the MIP images confirms the above  findings. NON-VASCULAR Hepatobiliary: No focal liver abnormality is seen. Status post cholecystectomy. No biliary dilatation. Pancreas: No ductal dilatation or inflammation. Spleen: Normal in size and arterial enhancement. Adrenals/Urinary Tract: No adrenal nodule. Suggestion of slight left renal atrophy. No hydronephrosis. Tiny cortical hypodensities in the left kidney are too small to characterize. Urinary bladder partially distended. No bladder wall thickening. Stomach/Bowel: Small hiatal hernia. Stomach is decompressed and not well assessed. No small bowel wall thickening or inflammatory change. Prior appendectomy with surgical clips in the right lower quadrant. Small volume of stool throughout the colon. Colonic diverticulosis from the splenic flexure distally, prominent in the sigmoid. No evidence of diverticulitis. Lymphatic: No abdominopelvic adenopathy. Reproductive: Status post hysterectomy. No adnexal masses. Other: Postsurgical change of the right anterior abdominal wall with mesh. Fat containing umbilical hernia. No free air or free fluid. Musculoskeletal: Right hip arthroplasty. Degenerative change in the spine with multilevel endplate changes. There are no acute or suspicious osseous abnormalities. Review of the MIP images confirms the above findings. IMPRESSION: 1. No aortic dissection or acute aortic abnormality. Mild thoracoabdominal aortic atherosclerosis. Aortic valvular calcifications, consider further evaluation with echocardiogram. 2. No acute abnormality in the chest, abdomen, or pelvis. 3. Incidental colonic diverticulosis without diverticulitis. Small hiatal hernia. 4. Right abdominal wall hernia repair with mesh. Fat containing umbilical hernia. Aortic Atherosclerosis (ICD10-I70.0). Electronically Signed   By: Keith Rake  M.D.   On: 03/31/2019 02:42    Scheduled Meds: . aspirin EC  325 mg Oral Daily  . atorvastatin  40 mg Oral q1800  . cholecalciferol  1,000 Units Oral Daily  .  docusate sodium  100 mg Oral Daily  . gabapentin  300 mg Oral Daily  . pantoprazole  40 mg Oral Daily  . sodium chloride flush  3 mL Intravenous Once  . traZODone  100 mg Oral QHS  . venlafaxine XR  75 mg Oral Q breakfast  . vitamin B-12  250 mcg Oral Daily   Continuous Infusions: . sodium chloride 75 mL/hr at 03/31/19 0535  . heparin 1,350 Units/hr (03/31/19 1302)     LOS: 0 days   Time spent: 30 minutes   Darliss Cheney, MD [facility]  04/01/2019, 10:24 AM   To contact the attending provider between 7A-7P or the covering provider during after hours 7P-7A, please log into the web site [email or web address] and use password [email or web address].

## 2019-04-01 NOTE — H&P (View-Only) (Signed)
Progress Note  Patient Name: [name] Date of Encounter: 04/01/2019  Primary Cardiologist: Minus Breeding, MD  Subjective   Feels better today.  No chest pain or breathlessness at rest.  No palpitations.  Inpatient Medications    Scheduled Meds: . aspirin EC  325 mg Oral Daily  . atorvastatin  40 mg Oral q1800  . cholecalciferol  1,000 Units Oral Daily  . docusate sodium  100 mg Oral Daily  . gabapentin  300 mg Oral Daily  . pantoprazole  40 mg Oral Daily  . sodium chloride flush  3 mL Intravenous Once  . traZODone  100 mg Oral QHS  . venlafaxine XR  75 mg Oral Q breakfast  . vitamin B-12  250 mcg Oral Daily   Continuous Infusions: . sodium chloride 75 mL/hr at 03/31/19 0535  . heparin 1,350 Units/hr (03/31/19 1302)   PRN Meds: acetaminophen, ALPRAZolam, morphine injection, nitroGLYCERIN, ondansetron (ZOFRAN) IV   Vital Signs    Vitals:   03/31/19 1639 03/31/19 2127 04/01/19 0149 04/01/19 0530  BP: 106/64 (!) 108/59 128/73 107/61  Pulse: 76 81 79 78  Resp:  16 16 16   Temp: 98.1 F (36.7 C) 97.9 F (36.6 C) 98.2 F (36.8 C) 98.5 F (36.9 C)  TempSrc: Oral Oral Oral Oral  SpO2: 98% 97% 97% 92%  Weight:    118.8 kg  Height:        Intake/Output Summary (Last 24 hours) at 04/01/2019 1019 Last data filed at 04/01/2019 0900 Gross per 24 hour  Intake 1846.04 ml  Output 650 ml  Net 1196.04 ml   Filed Weights   03/31/19 0400 03/31/19 1334 04/01/19 0530  Weight: 113.4 kg 113.8 kg 118.8 kg    Telemetry    Sinus rhythm.  Personally reviewed.  ECG    An ECG dated 03/31/2019 was personally reviewed today and demonstrated:  Sinus rhythm with LVH and diffuse repolarization abnormalities.  Physical Exam   GEN:  Obese woman. No acute distress.   Neck: No JVD. Cardiac: RRR, 3/6 systolic murmur, rub, or gallop.  Respiratory: Nonlabored. Clear to auscultation bilaterally. GI: Soft, nontender, bowel sounds present. MS:  Mild lower leg edema; No  deformity. 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 Psych: Alert and oriented x 3. Normal affect.  Labs    Chemistry Recent Labs  Lab 03/30/19 1858  NA 133*  K 4.1  CL 98  CO2 25  GLUCOSE 103*  BUN 13  CREATININE 0.64  CALCIUM 9.1  GFRNONAA >60  GFRAA >60  ANIONGAP 10     Hematology Recent Labs  Lab 03/30/19 1858 04/01/19 0601  WBC 7.7 11.3*  RBC 4.69 4.42  HGB 14.8 14.0  HCT 44.5 41.1  MCV 94.9 93.0  MCH 31.6 31.7  MCHC 33.3 34.1  RDW 13.2 13.1  PLT 296 273    Cardiac Enzymes Recent Labs  Lab 03/30/19 1858 03/30/19 2134 03/31/19 0607 03/31/19 0850 03/31/19 1113  TROPONINIHS 41* 46* 50* 51* 49*    Radiology    Dg Chest 2 View  Result Date: 03/30/2019 CLINICAL DATA:  Acute chest pain for 1 day. EXAM: CHEST - 2 VIEW COMPARISON:  09/25/2017 FINDINGS: The cardiomediastinal silhouette is unremarkable. There is no evidence of focal airspace disease, pulmonary edema, suspicious pulmonary nodule/mass, pleural effusion, or pneumothorax. No acute bony abnormalities are identified. IMPRESSION: No active cardiopulmonary disease. Electronically Signed   By: Margarette Canada M.D.   On: 03/30/2019 19:28   Ct Angio  Chest/abd/pel For Dissection W And/or Wo Contrast  Result Date: 03/31/2019 CLINICAL DATA:  Chest/back pain, acute, aortic dissection suspect EXAM: CT ANGIOGRAPHY CHEST, ABDOMEN AND PELVIS TECHNIQUE: Multidetector CT imaging through the chest, abdomen and pelvis was performed using the standard protocol during bolus administration of intravenous contrast. Multiplanar reconstructed images and MIPs were obtained and reviewed to evaluate the vascular anatomy. CONTRAST:  12mL OMNIPAQUE IOHEXOL 350 MG/ML SOLN COMPARISON:  Chest radiograph yesterday. Abdominopelvic CT 03/31/2016 FINDINGS: CTA CHEST FINDINGS Cardiovascular: Thoracic aorta is normal in caliber. No aortic dissection, acute aortic syndrome or aneurysm. Conventional branching pattern from the  aortic arch. Aortic valvular calcifications. No central pulmonary embolus to the lobar level. Heart is normal in size. No pericardial effusion. Mediastinum/Nodes: Calcified right hilar nodes consistent with prior granulomatous disease. No suspicious noncalcified adenopathy. Subcentimeter left thyroid nodule does not meet size criteria for further evaluation. Patulous esophagus. Small hiatal hernia. Lungs/Pleura: Heterogeneous pulmonary parenchyma. 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 Clustered nodules in the subpleural right middle lobe, series 8, image 104, chronic and unchanged from 2017 abdominal CT. No pulmonary edema. No pleural fluid. The trachea and mainstem bronchi are patent. Musculoskeletal: Mild multilevel degenerative change in the spine. There are no acute or suspicious osseous abnormalities. Degenerative change in the right shoulder. Review of the MIP images confirms the above findings. CTA ABDOMEN AND PELVIS FINDINGS VASCULAR Aorta: Normal caliber aorta without aneurysm, dissection, vasculitis or significant stenosis. Mild atherosclerosis. Celiac: Patent without evidence of aneurysm, dissection, vasculitis or significant stenosis. SMA: Patent without evidence of aneurysm, dissection, vasculitis or significant stenosis. Renals: Both renal arteries are patent without evidence of aneurysm, dissection, vasculitis, fibromuscular dysplasia or significant stenosis. IMA: Patent without evidence of aneurysm, dissection, vasculitis or significant stenosis. Inflow: Patent without evidence of aneurysm, dissection, vasculitis or significant stenosis. Veins: Limited assessment on this arterial phase exam. Review of the MIP images confirms the above findings. NON-VASCULAR Hepatobiliary: No focal liver abnormality is seen. Status post cholecystectomy. No biliary dilatation. Pancreas: No ductal dilatation or inflammation. Spleen: Normal in  size and arterial enhancement. Adrenals/Urinary Tract: No adrenal nodule. Suggestion of slight left renal atrophy. No hydronephrosis. Tiny cortical hypodensities in the left kidney are too small to characterize. Urinary bladder partially distended. No bladder wall thickening. Stomach/Bowel: Small hiatal hernia. Stomach is decompressed and not well assessed. No small bowel wall thickening or inflammatory change. Prior appendectomy with surgical clips in the right lower quadrant. Small volume of stool throughout the colon. Colonic diverticulosis from the splenic flexure distally, prominent in the sigmoid. No evidence of diverticulitis. Lymphatic: No abdominopelvic adenopathy. Reproductive: Status post hysterectomy. No adnexal masses. Other: Postsurgical change of the right anterior abdominal wall with mesh. Fat containing umbilical hernia. No free air or free fluid. Musculoskeletal: Right hip arthroplasty. Degenerative change in the spine with multilevel endplate changes. There are no acute or suspicious osseous abnormalities. Review of the MIP images confirms the above findings. IMPRESSION: 1. No aortic dissection or acute aortic abnormality. Mild thoracoabdominal aortic atherosclerosis. Aortic valvular calcifications, consider further evaluation with echocardiogram. 2. No acute abnormality in the chest, abdomen, or pelvis. 3. Incidental colonic diverticulosis without diverticulitis. Small hiatal hernia. 4. Right abdominal wall hernia repair with mesh. Fat containing umbilical hernia. Aortic Atherosclerosis (ICD10-I70.0). Electronically Signed   By: Keith Rake M.D.   On: 03/31/2019 02:42    Cardiac Studies   Echocardiogram 03/31/2019:  1. Limited images.  2. Left ventricular ejection fraction, by visual estimation,  is 55%. The left ventricle has normal function. There is mildly increased left ventricular hypertrophy.  3. Definity contrast agent was given IV to delineate the left ventricular endocardial  borders.  4. Left ventricular diastolic Doppler parameters are consistent with impaired relaxation pattern of LV diastolic filling.  5. Global right ventricle was not well visualized.The right ventricular size is not well visualized. Right vetricular wall thickness was not assessed.  6. Left atrial size was normal.  7. Right atrial size was normal.  8. Mild mitral annular calcification.  9. The mitral valve is grossly normal. No evidence of mitral valve regurgitation. 10. The tricuspid valve is not well visualized. Tricuspid valve regurgitation is trivial. 11. Aortic valve mean gradient measures 71.8 mmHg. 12. The aortic valve was not well visualized but is calcified with restricted motion. Aortic valve regurgitation is mild by color flow Doppler. Severe aortic valve stenosis is suggested by gradients. Suggest TEE to further assess aortic valve structure. 13. The pulmonic valve was not well visualized. Pulmonic valve regurgitation was not assessed by color flow Doppler. 14. Aortic dilatation noted. 15. There is mild dilatation of the ascending aorta. 16. The inferior vena cava is normal in size with greater than 50% respiratory variability, suggesting right atrial pressure of 3 mmHg. 17. The interatrial septum was not well visualized. 18. TR signal is inadequate for assessing pulmonary artery systolic pressure.  Patient Profile     63 y.o. female with a history of mild aortic stenosis, HLD, GERD, wheelchair bound due to hx of polio, depression, and anxiety  now presenting with chest pain.  Assessment & Plan    1.  Chest pain concerning for unstable angina.  High-sensitivity troponin I trend is not diagnostic for ACS.  ECG shows LVH with diffuse repolarization abnormalities.  She does have a family history of CAD, also hyperlipidemia.  Plan is for diagnostic cardiac catheterization tomorrow.  2.  Severe calcific aortic stenosis, images are limited by transthoracic study.  This has progressed  since last evaluation in 2015.  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  4.  Mixed hyperlipidemia with LDL 138.  Lipitor started.  Discussed current testing and overall situation with patient this morning. We will plan on a diagnostic cardiac catheterization tomorrow.  She will ultimately need a TEE to further evaluate aortic stenosis.  I suspect given her post polio syndrome and anticipated difficulty with rehab from a major surgery, she would be a better candidate for TAVR if possible and also PCI.  Signed, Rozann Lesches, MD  04/01/2019, 10:19 AM

## 2019-04-01 NOTE — Progress Notes (Signed)
[place] for heparin Indication: chest pain/ACS  Allergies  Allergen Reactions  . Flagyl [Metronidazole] Anaphylaxis  . Amoxicillin Swelling  . Other Nausea Only    Tylenol with Codeine #3  . Percocet [Oxycodone-Acetaminophen] Nausea Only  . Plaquenil [Hydroxychloroquine Sulfate] Other (See Comments)    Blurred vision, joint pain  . Sulfa Antibiotics Nausea Only    Patient Measurements: Height: 5\' 8"  (172.7 cm) Weight: 262 lb (118.8 kg) IBW/kg (Calculated) : 63.9 Heparin Dosing Weight: 89.9 kg  Vital Signs: Temp: 98.5 F (36.9 C) (10/18 0530) Temp Source: Oral (10/18 0530) BP: 107/61 (10/18 0530) Pulse Rate: 78 (10/18 0530)  Labs: Recent Labs    03/30/19 1858  03/31/19 0607 03/31/19 0850 03/31/19 1113 03/31/19 1154 03/31/19 2000 04/01/19 0601  HGB 14.8  --   --   --   --   --   --  14.0  HCT 44.5  --   --   --   --   --   --  41.1  PLT 296  --   --   --   --   --   --  273  HEPARINUNFRC  --   --   --   --   --  0.15* 0.40 0.59  CREATININE 0.64  --   --   --   --   --   --   --   TROPONINIHS 41*   < > 50* 51* 49*  --   --   --    < > = values in this interval not displayed.    Estimated Creatinine Clearance: 95.1 mL/min (by C-G formula based on SCr of 0.64 mg/dL).  Assessment: 36 yof presented to the ED with CP, positive trops. Plans are for cath 10/19. No AC noted PTA.   Heparin level this AM is therapeutic at 0.59 on 1350 units/hour. H&H is stable at 14/41.1 and plts are wnl.   Goal of Therapy:  Heparin level 0.3-0.7 units/ml Monitor platelets by anticoagulation protocol: Yes   Plan:  -Continue heparin 1350 units/hr -Daily heparin level and CBC -Monitor signs of bleeding -Follow up post up anticoagulation plans     Thank you,   Eddie Candle, PharmD PGY-1 Pharmacy Resident   Please check amion for clinical pharmacist contact number

## 2019-04-01 NOTE — Progress Notes (Signed)
Progress Note  Patient Name: KYLIEGH ZAUNER Date of Encounter: 04/01/2019  Primary Cardiologist: Minus Breeding, MD  Subjective   Feels better today.  No chest pain or breathlessness at rest.  No palpitations.  Inpatient Medications    Scheduled Meds: . aspirin EC  325 mg Oral Daily  . atorvastatin  40 mg Oral q1800  . cholecalciferol  1,000 Units Oral Daily  . docusate sodium  100 mg Oral Daily  . gabapentin  300 mg Oral Daily  . pantoprazole  40 mg Oral Daily  . sodium chloride flush  3 mL Intravenous Once  . traZODone  100 mg Oral QHS  . venlafaxine XR  75 mg Oral Q breakfast  . vitamin B-12  250 mcg Oral Daily   Continuous Infusions: . sodium chloride 75 mL/hr at 03/31/19 0535  . heparin 1,350 Units/hr (03/31/19 1302)   PRN Meds: acetaminophen, ALPRAZolam, morphine injection, nitroGLYCERIN, ondansetron (ZOFRAN) IV   Vital Signs    Vitals:   03/31/19 1639 03/31/19 2127 04/01/19 0149 04/01/19 0530  BP: 106/64 (!) 108/59 128/73 107/61  Pulse: 76 81 79 78  Resp:  16 16 16   Temp: 98.1 F (36.7 C) 97.9 F (36.6 C) 98.2 F (36.8 C) 98.5 F (36.9 C)  TempSrc: Oral Oral Oral Oral  SpO2: 98% 97% 97% 92%  Weight:    118.8 kg  Height:        Intake/Output Summary (Last 24 hours) at 04/01/2019 1019 Last data filed at 04/01/2019 0900 Gross per 24 hour  Intake 1846.04 ml  Output 650 ml  Net 1196.04 ml   Filed Weights   03/31/19 0400 03/31/19 1334 04/01/19 0530  Weight: 113.4 kg 113.8 kg 118.8 kg    Telemetry    Sinus rhythm.  Personally reviewed.  ECG    An ECG dated 03/31/2019 was personally reviewed today and demonstrated:  Sinus rhythm with LVH and diffuse repolarization abnormalities.  Physical Exam   GEN:  Obese woman. No acute distress.   Neck: No JVD. Cardiac: RRR, 3/6 systolic murmur, rub, or gallop.  Respiratory: Nonlabored. Clear to auscultation bilaterally. GI: Soft, nontender, bowel sounds present. MS:  Mild lower leg edema; No  deformity. 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 Psych: Alert and oriented x 3. Normal affect.  Labs    Chemistry Recent Labs  Lab 03/30/19 1858  NA 133*  K 4.1  CL 98  CO2 25  GLUCOSE 103*  BUN 13  CREATININE 0.64  CALCIUM 9.1  GFRNONAA >60  GFRAA >60  ANIONGAP 10     Hematology Recent Labs  Lab 03/30/19 1858 04/01/19 0601  WBC 7.7 11.3*  RBC 4.69 4.42  HGB 14.8 14.0  HCT 44.5 41.1  MCV 94.9 93.0  MCH 31.6 31.7  MCHC 33.3 34.1  RDW 13.2 13.1  PLT 296 273    Cardiac Enzymes Recent Labs  Lab 03/30/19 1858 03/30/19 2134 03/31/19 0607 03/31/19 0850 03/31/19 1113  TROPONINIHS 41* 46* 50* 51* 49*    Radiology    Dg Chest 2 View  Result Date: 03/30/2019 CLINICAL DATA:  Acute chest pain for 1 day. EXAM: CHEST - 2 VIEW COMPARISON:  09/25/2017 FINDINGS: The cardiomediastinal silhouette is unremarkable. There is no evidence of focal airspace disease, pulmonary edema, suspicious pulmonary nodule/mass, pleural effusion, or pneumothorax. No acute bony abnormalities are identified. IMPRESSION: No active cardiopulmonary disease. Electronically Signed   By: Margarette Canada M.D.   On: 03/30/2019 19:28   Ct Angio  Chest/abd/pel For Dissection W And/or Wo Contrast  Result Date: 03/31/2019 CLINICAL DATA:  Chest/back pain, acute, aortic dissection suspect EXAM: CT ANGIOGRAPHY CHEST, ABDOMEN AND PELVIS TECHNIQUE: Multidetector CT imaging through the chest, abdomen and pelvis was performed using the standard protocol during bolus administration of intravenous contrast. Multiplanar reconstructed images and MIPs were obtained and reviewed to evaluate the vascular anatomy. CONTRAST:  13mL OMNIPAQUE IOHEXOL 350 MG/ML SOLN COMPARISON:  Chest radiograph yesterday. Abdominopelvic CT 03/31/2016 FINDINGS: CTA CHEST FINDINGS Cardiovascular: Thoracic aorta is normal in caliber. No aortic dissection, acute aortic syndrome or aneurysm. Conventional branching pattern from the  aortic arch. Aortic valvular calcifications. No central pulmonary embolus to the lobar level. Heart is normal in size. No pericardial effusion. Mediastinum/Nodes: Calcified right hilar nodes consistent with prior granulomatous disease. No suspicious noncalcified adenopathy. Subcentimeter left thyroid nodule does not meet size criteria for further evaluation. Patulous esophagus. Small hiatal hernia. Lungs/Pleura: Heterogeneous pulmonary parenchyma. 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 Clustered nodules in the subpleural right middle lobe, series 8, image 104, chronic and unchanged from 2017 abdominal CT. No pulmonary edema. No pleural fluid. The trachea and mainstem bronchi are patent. Musculoskeletal: Mild multilevel degenerative change in the spine. There are no acute or suspicious osseous abnormalities. Degenerative change in the right shoulder. Review of the MIP images confirms the above findings. CTA ABDOMEN AND PELVIS FINDINGS VASCULAR Aorta: Normal caliber aorta without aneurysm, dissection, vasculitis or significant stenosis. Mild atherosclerosis. Celiac: Patent without evidence of aneurysm, dissection, vasculitis or significant stenosis. SMA: Patent without evidence of aneurysm, dissection, vasculitis or significant stenosis. Renals: Both renal arteries are patent without evidence of aneurysm, dissection, vasculitis, fibromuscular dysplasia or significant stenosis. IMA: Patent without evidence of aneurysm, dissection, vasculitis or significant stenosis. Inflow: Patent without evidence of aneurysm, dissection, vasculitis or significant stenosis. Veins: Limited assessment on this arterial phase exam. Review of the MIP images confirms the above findings. NON-VASCULAR Hepatobiliary: No focal liver abnormality is seen. Status post cholecystectomy. No biliary dilatation. Pancreas: No ductal dilatation or inflammation. Spleen: Normal in  size and arterial enhancement. Adrenals/Urinary Tract: No adrenal nodule. Suggestion of slight left renal atrophy. No hydronephrosis. Tiny cortical hypodensities in the left kidney are too small to characterize. Urinary bladder partially distended. No bladder wall thickening. Stomach/Bowel: Small hiatal hernia. Stomach is decompressed and not well assessed. No small bowel wall thickening or inflammatory change. Prior appendectomy with surgical clips in the right lower quadrant. Small volume of stool throughout the colon. Colonic diverticulosis from the splenic flexure distally, prominent in the sigmoid. No evidence of diverticulitis. Lymphatic: No abdominopelvic adenopathy. Reproductive: Status post hysterectomy. No adnexal masses. Other: Postsurgical change of the right anterior abdominal wall with mesh. Fat containing umbilical hernia. No free air or free fluid. Musculoskeletal: Right hip arthroplasty. Degenerative change in the spine with multilevel endplate changes. There are no acute or suspicious osseous abnormalities. Review of the MIP images confirms the above findings. IMPRESSION: 1. No aortic dissection or acute aortic abnormality. Mild thoracoabdominal aortic atherosclerosis. Aortic valvular calcifications, consider further evaluation with echocardiogram. 2. No acute abnormality in the chest, abdomen, or pelvis. 3. Incidental colonic diverticulosis without diverticulitis. Small hiatal hernia. 4. Right abdominal wall hernia repair with mesh. Fat containing umbilical hernia. Aortic Atherosclerosis (ICD10-I70.0). Electronically Signed   By: Keith Rake M.D.   On: 03/31/2019 02:42    Cardiac Studies   Echocardiogram 03/31/2019:  1. Limited images.  2. Left ventricular ejection fraction, by visual estimation,  is 55%. The left ventricle has normal function. There is mildly increased left ventricular hypertrophy.  3. Definity contrast agent was given IV to delineate the left ventricular endocardial  borders.  4. Left ventricular diastolic Doppler parameters are consistent with impaired relaxation pattern of LV diastolic filling.  5. Global right ventricle was not well visualized.The right ventricular size is not well visualized. Right vetricular wall thickness was not assessed.  6. Left atrial size was normal.  7. Right atrial size was normal.  8. Mild mitral annular calcification.  9. The mitral valve is grossly normal. No evidence of mitral valve regurgitation. 10. The tricuspid valve is not well visualized. Tricuspid valve regurgitation is trivial. 11. Aortic valve mean gradient measures 71.8 mmHg. 12. The aortic valve was not well visualized but is calcified with restricted motion. Aortic valve regurgitation is mild by color flow Doppler. Severe aortic valve stenosis is suggested by gradients. Suggest TEE to further assess aortic valve structure. 13. The pulmonic valve was not well visualized. Pulmonic valve regurgitation was not assessed by color flow Doppler. 14. Aortic dilatation noted. 15. There is mild dilatation of the ascending aorta. 16. The inferior vena cava is normal in size with greater than 50% respiratory variability, suggesting right atrial pressure of 3 mmHg. 17. The interatrial septum was not well visualized. 18. TR signal is inadequate for assessing pulmonary artery systolic pressure.  Patient Profile     63 y.o. female with a history of mild aortic stenosis, HLD, GERD, wheelchair bound due to hx of polio, depression, and anxiety  now presenting with chest pain.  Assessment & Plan    1.  Chest pain concerning for unstable angina.  High-sensitivity troponin I trend is not diagnostic for ACS.  ECG shows LVH with diffuse repolarization abnormalities.  She does have a family history of CAD, also hyperlipidemia.  Plan is for diagnostic cardiac catheterization tomorrow.  2.  Severe calcific aortic stenosis, images are limited by transthoracic study.  This has progressed  since last evaluation in 2015.  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  4.  Mixed hyperlipidemia with LDL 138.  Lipitor started.  Discussed current testing and overall situation with patient this morning. We will plan on a diagnostic cardiac catheterization tomorrow.  She will ultimately need a TEE to further evaluate aortic stenosis.  I suspect given her post polio syndrome and anticipated difficulty with rehab from a major surgery, she would be a better candidate for TAVR if possible and also PCI.  Signed, Rozann Lesches, MD  04/01/2019, 10:19 AM

## 2019-04-02 ENCOUNTER — Encounter (HOSPITAL_COMMUNITY)
Admission: EM | Disposition: A | Discharge status: Home / Self Care | Payer: Self-pay | Source: Home / Self Care | Attending: Family Medicine

## 2019-04-02 ENCOUNTER — Inpatient Hospital Stay (HOSPITAL_COMMUNITY): Payer: Medicare Other

## 2019-04-02 ENCOUNTER — Encounter (HOSPITAL_COMMUNITY): Payer: Self-pay | Admitting: Physician Assistant

## 2019-04-02 DIAGNOSIS — R079 Chest pain, unspecified: Secondary | ICD-10-CM

## 2019-04-02 DIAGNOSIS — Z01810 Encounter for preprocedural cardiovascular examination: Secondary | ICD-10-CM

## 2019-04-02 DIAGNOSIS — I214 Non-ST elevation (NSTEMI) myocardial infarction: Principal | ICD-10-CM

## 2019-04-02 DIAGNOSIS — E785 Hyperlipidemia, unspecified: Secondary | ICD-10-CM

## 2019-04-02 HISTORY — PX: RIGHT/LEFT HEART CATH AND CORONARY ANGIOGRAPHY: CATH118266

## 2019-04-02 LAB — POCT I-STAT 7, (LYTES, BLD GAS, ICA,H+H)
Acid-base deficit: 1 mmol/L (ref 0.0–2.0)
Bicarbonate: 23.5 mmol/L (ref 20.0–28.0)
Calcium, Ion: 1.12 mmol/L — ABNORMAL LOW (ref 1.15–1.40)
HCT: 39 % (ref 36.0–46.0)
Hemoglobin: 13.3 g/dL (ref 12.0–15.0)
O2 Saturation: 99 %
Potassium: 3.8 mmol/L (ref 3.5–5.1)
Sodium: 142 mmol/L (ref 135–145)
TCO2: 25 mmol/L (ref 22–32)
pCO2 arterial: 37.4 mmHg (ref 32.0–48.0)
pH, Arterial: 7.406 (ref 7.350–7.450)
pO2, Arterial: 120 mmHg — ABNORMAL HIGH (ref 83.0–108.0)

## 2019-04-02 LAB — BASIC METABOLIC PANEL
Anion gap: 11 (ref 5–15)
BUN: 9 mg/dL (ref 8–23)
CO2: 21 mmol/L — ABNORMAL LOW (ref 22–32)
Calcium: 8.3 mg/dL — ABNORMAL LOW (ref 8.9–10.3)
Chloride: 108 mmol/L (ref 98–111)
Creatinine, Ser: 0.63 mg/dL (ref 0.44–1.00)
GFR calc Af Amer: >60 mL/min (ref >60)
GFR calc non Af Amer: >60 mL/min (ref >60)
Glucose, Bld: 110 mg/dL — ABNORMAL HIGH (ref 70–99)
Potassium: 3.7 mmol/L (ref 3.5–5.1)
Sodium: 140 mmol/L (ref 135–145)

## 2019-04-02 LAB — CBC
HCT: 41.7 % (ref 36.0–46.0)
Hemoglobin: 13.5 g/dL (ref 12.0–15.0)
MCH: 31.4 pg (ref 26.0–34.0)
MCHC: 32.4 g/dL (ref 30.0–36.0)
MCV: 97 fL (ref 80.0–100.0)
Platelets: 228 10*3/uL (ref 150–400)
RBC: 4.3 MIL/uL (ref 3.87–5.11)
RDW: 13.4 % (ref 11.5–15.5)
WBC: 6.9 10*3/uL (ref 4.0–10.5)
nRBC: 0 % (ref 0.0–0.2)

## 2019-04-02 LAB — POCT I-STAT EG7
Acid-base deficit: 1 mmol/L (ref 0.0–2.0)
Bicarbonate: 24.4 mmol/L (ref 20.0–28.0)
Calcium, Ion: 1.17 mmol/L (ref 1.15–1.40)
HCT: 39 % (ref 36.0–46.0)
Hemoglobin: 13.3 g/dL (ref 12.0–15.0)
O2 Saturation: 61 %
Potassium: 3.9 mmol/L (ref 3.5–5.1)
Sodium: 142 mmol/L (ref 135–145)
TCO2: 26 mmol/L (ref 22–32)
pCO2, Ven: 41 mmHg — ABNORMAL LOW (ref 44.0–60.0)
pH, Ven: 7.382 (ref 7.250–7.430)
pO2, Ven: 32 mmHg (ref 32.0–45.0)

## 2019-04-02 LAB — PROTIME-INR
INR: 1.1 (ref 0.8–1.2)
Prothrombin Time: 14.4 seconds (ref 11.4–15.2)

## 2019-04-02 LAB — HEPARIN LEVEL (UNFRACTIONATED): Heparin Unfractionated: 0.73 IU/mL — ABNORMAL HIGH (ref 0.30–0.70)

## 2019-04-02 LAB — POCT ACTIVATED CLOTTING TIME: Activated Clotting Time: 136 seconds

## 2019-04-02 SURGERY — RIGHT/LEFT HEART CATH AND CORONARY ANGIOGRAPHY
Anesthesia: LOCAL

## 2019-04-02 MED ORDER — ONDANSETRON HCL 4 MG/2ML IJ SOLN
INTRAMUSCULAR | Status: AC
Start: 2019-04-02 — End: ?
  Filled 2019-04-02: qty 2

## 2019-04-02 MED ORDER — IOHEXOL 350 MG/ML SOLN
INTRAVENOUS | Status: DC | PRN
  Administered 2019-04-02: 09:00:00 55 mL via INTRACARDIAC

## 2019-04-02 MED ORDER — HYDRALAZINE HCL 20 MG/ML IJ SOLN: 10.0000 mg | INTRAMUSCULAR | Status: AC | PRN

## 2019-04-02 MED ORDER — MIDAZOLAM HCL 2 MG/2ML IJ SOLN
INTRAMUSCULAR | Status: AC
  Filled 2019-04-02: qty 2

## 2019-04-02 MED ORDER — ENOXAPARIN SODIUM 40 MG/0.4ML ~~LOC~~ SOLN
40.0000 mg | SUBCUTANEOUS | Status: DC
  Administered 2019-04-03: 40 mg via SUBCUTANEOUS
  Filled 2019-04-02: qty 0.4

## 2019-04-02 MED ORDER — SODIUM CHLORIDE 0.9 % IV SOLN: 250.0000 mL | INTRAVENOUS | Status: DC | PRN

## 2019-04-02 MED ORDER — LIDOCAINE HCL (PF) 1 % IJ SOLN
INTRAMUSCULAR | Status: DC | PRN
  Administered 2019-04-02: 15 mL via INTRADERMAL

## 2019-04-02 MED ORDER — SODIUM CHLORIDE 0.9% FLUSH: 10.0000 mL | INTRAVENOUS | Status: DC | PRN

## 2019-04-02 MED ORDER — FENTANYL CITRATE (PF) 100 MCG/2ML IJ SOLN
INTRAMUSCULAR | Status: AC
  Filled 2019-04-02: qty 2

## 2019-04-02 MED ORDER — FENTANYL CITRATE (PF) 100 MCG/2ML IJ SOLN
INTRAMUSCULAR | Status: DC | PRN
  Administered 2019-04-02 (×2): 25 ug via INTRAVENOUS
  Administered 2019-04-02: 50 ug via INTRAVENOUS

## 2019-04-02 MED ORDER — SODIUM CHLORIDE 0.9% FLUSH
10.0000 mL | Freq: Two times a day (BID) | INTRAVENOUS | Status: DC
  Administered 2019-04-03: 10 mL

## 2019-04-02 MED ORDER — LIDOCAINE HCL (PF) 1 % IJ SOLN
INTRAMUSCULAR | Status: AC
  Filled 2019-04-02: qty 30

## 2019-04-02 MED ORDER — HYDRALAZINE HCL 20 MG/ML IJ SOLN
INTRAMUSCULAR | Status: AC
  Filled 2019-04-02: qty 1

## 2019-04-02 MED ORDER — HEPARIN (PORCINE) IN NACL 1000-0.9 UT/500ML-% IV SOLN
INTRAVENOUS | Status: AC
  Filled 2019-04-02: qty 1000

## 2019-04-02 MED ORDER — SODIUM CHLORIDE 0.9% FLUSH
3.0000 mL | Freq: Two times a day (BID) | INTRAVENOUS | Status: DC
  Administered 2019-04-02 – 2019-04-03 (×2): 3 mL via INTRAVENOUS

## 2019-04-02 MED ORDER — MIDAZOLAM HCL 2 MG/2ML IJ SOLN
INTRAMUSCULAR | Status: DC | PRN
  Administered 2019-04-02 (×3): 1 mg via INTRAVENOUS

## 2019-04-02 MED ORDER — HEPARIN (PORCINE) IN NACL 1000-0.9 UT/500ML-% IV SOLN
INTRAVENOUS | Status: DC | PRN
  Administered 2019-04-02 (×2): 500 mL

## 2019-04-02 MED ORDER — SODIUM CHLORIDE 0.9% FLUSH: 3.0000 mL | INTRAVENOUS | Status: DC | PRN

## 2019-04-02 MED ORDER — LABETALOL HCL 5 MG/ML IV SOLN: 10.0000 mg | INTRAVENOUS | Status: AC | PRN

## 2019-04-02 MED ORDER — FUROSEMIDE 10 MG/ML IJ SOLN
20.0000 mg | Freq: Once | INTRAMUSCULAR | Status: AC
  Administered 2019-04-02: 20 mg via INTRAVENOUS
  Filled 2019-04-02: qty 2

## 2019-04-02 MED ORDER — ONDANSETRON HCL 4 MG/2ML IJ SOLN
INTRAMUSCULAR | Status: DC | PRN
  Administered 2019-04-02: 4 mg via INTRAVENOUS

## 2019-04-02 MED ORDER — HYDRALAZINE HCL 20 MG/ML IJ SOLN
INTRAMUSCULAR | Status: DC | PRN
  Administered 2019-04-02: 10 mg via INTRAVENOUS

## 2019-04-02 SURGICAL SUPPLY — 18 items
CATH DXT MULTI JL4 JR4 ANG PIG (CATHETERS) ×1 IMPLANT
CATH LANGSTON DUAL LUM PIG 6FR (CATHETERS) ×1 IMPLANT
CATH SWAN GANZ 7F STRAIGHT (CATHETERS) ×1 IMPLANT
HOVERMATT SINGLE USE (MISCELLANEOUS) ×1 IMPLANT
KIT HEART LEFT (KITS) ×2 IMPLANT
KIT MICROPUNCTURE NIT STIFF (SHEATH) ×1 IMPLANT
PACK CARDIAC CATHETERIZATION (CUSTOM PROCEDURE TRAY) ×2 IMPLANT
SHEATH PINNACLE 5F 10CM (SHEATH) ×1 IMPLANT
SHEATH PINNACLE 6F 10CM (SHEATH) ×1 IMPLANT
SHEATH PINNACLE 7F 10CM (SHEATH) ×1 IMPLANT
SHEATH PROBE COVER 6X72 (BAG) ×2 IMPLANT
TRANSDUCER W/STOPCOCK (MISCELLANEOUS) ×3 IMPLANT
TUBING ART PRESS 72  MALE/FEM (TUBING) ×1
TUBING ART PRESS 72 MALE/FEM (TUBING) IMPLANT
TUBING CIL FLEX 10 FLL-RA (TUBING) ×2 IMPLANT
WIRE EMERALD 3MM-J .035X150CM (WIRE) ×1 IMPLANT
WIRE EMERALD 3MM-J .035X260CM (WIRE) ×1 IMPLANT
WIRE EMERALD ST .035X150CM (WIRE) ×1 IMPLANT

## 2019-04-02 NOTE — Progress Notes (Signed)
PROGRESS NOTE    [name]  [id number] DOB: 1955-09-17 DOA: 03/30/2019 PCP: [facility]   Brief Narrative:  [name] is a 63 y.o. female with medical history significant of HLD, GERD, depression with anxiety, diverticulitis, wheelchair bond due to hx of polio, obesity,  who presented with chest pain on 03/31/2019.  The pain has been ongoing since Thursday..  It is substernal, pressure-like and radiating to the mid back.  No shortness of breath or any other complaint.  Patient was seen by PCP Friday afternoon, and had EKG in office which was abnormal, therefore patient was sent o ED for further evaluation treatment.   Upon arrival to ED, pt was found to have troponin 41, 46, WBC 7.1, negative COVID-19 test, electrolytes renal function okay, temperature 99.1, blood pressure 138/74, heart rate 67, oxygen saturation 99% on room air.  Chest x-ray negative.  CT angiogram is negative for dissection and central PE.    Patient was admitted to hospital service and cardiology consulted.  She was started on heparin drip and then due to chest pain and elevated troponin, she was also started on nitro patch which relieved her chest pain.  She then underwent cardiac cath on 04/02/2019 daily which showed clean coronaries but confirmed severe aortic stenosis.  Valvular team was consulted.  Assessment & Plan:   Principal Problem:   Chest pain Active Problems:   NSTEMI (non-ST elevated myocardial infarction) (HCC)   Hyperlipidemia   GERD (gastroesophageal reflux disease)   Depression with anxiety   Unstable angina (HCC)   Aortic valve stenosis  Chest pain/unstable angina/severe aortic stenosis: Repeat echo once again shows severe aortic stenosis.  Troponin mildly elevated and flat does not indicate ACS.  Her pain could be due to aortic stenosis versus unstable angina.  Cardiology on board.  Underwent cardiac catheterization today which confirmed severe aortic  stenosis but clean coronaries.  Valvular team has been consulted and they recommend keeping her in the hospital due to disability and completing full work-up as inpatient and possible discharge in next 1 to 2 days.  Hyperlipidemia: Continue Lipitor  GERD: Continue PPI  Depression with anxiety: Continue Xanax and Effexor.  DVT prophylaxis: Lovenox Code Status: Full code Family Communication:  None present at bedside.  Plan of care discussed with patient in length and he verbalized understanding and agreed with it. Disposition Plan: TBD  Estimated body mass index is 40.56 kg/m as calculated from the following:   Height as of this encounter: 5\' 8"  (1.727 m).   Weight as of this encounter: 121 kg.      Nutritional status:               Consultants:   Cardiology  Procedures:   Cardiac catheterization 04/02/2019  Antimicrobials:   None   Subjective: Patient seen and examined.  A friend at the bedside.  Dr. Tamala Julian from cardiology was also in the room.  We all discussed about her cardiac catheterization reports and further plans.  Patient states that she is feeling very good.  No more chest pain or any other complaint.  She is wishing to go home.  Objective: Vitals:   04/02/19 0843 04/02/19 0848 04/02/19 0852 04/02/19 0857  BP: (!) 185/99 (!) 139/97 (!) 175/97 (!) 177/98  Pulse: 71 64 70 71  Resp: (!) 4 15 (!) 23 (!) 21  Temp:      TempSrc:      SpO2: 96% 97% 98% 98%  Weight:  Height:        Intake/Output Summary (Last 24 hours) at 04/02/2019 0916 Last data filed at 04/02/2019 0716 Gross per 24 hour  Intake 780 ml  Output 2750 ml  Net -1970 ml   Filed Weights   03/31/19 1334 04/01/19 0530 04/02/19 0446  Weight: 113.8 kg 118.8 kg 121 kg    Examination:  General exam: Appears calm and comfortable, obese Respiratory system: Clear to auscultation. Respiratory effort normal. Cardiovascular system: S1 & S2 heard, RRR. No JVD, 4/6 systolic murmur,  rubs, gallops or clicks. No pedal edema. Gastrointestinal system: Abdomen is nondistended, soft and nontender. No organomegaly or masses felt. Normal bowel sounds heard. Central nervous system: Alert and oriented.  Weakness in lower extremities due to polio Extremities: Symmetric 5 x 5 power. Skin: No rashes, lesions or ulcers.  Psychiatry: Judgement and insight appear normal. Mood & affect appropriate.    Data Reviewed: I have personally reviewed following labs and imaging studies  CBC: Recent Labs  Lab 03/30/19 1858 04/01/19 0601 04/02/19 0625  WBC 7.7 11.3* 6.9  HGB 14.8 14.0 13.5  HCT 44.5 41.1 41.7  MCV 94.9 93.0 97.0  PLT 296 273 [id number]   Basic Metabolic Panel: Recent Labs  Lab 03/30/19 1858 04/02/19 0625  NA 133* 140  K 4.1 3.7  CL 98 108  CO2 25 21*  GLUCOSE 103* 110*  BUN 13 9  CREATININE 0.64 0.63  CALCIUM 9.1 8.3*   GFR: Estimated Creatinine Clearance: 96 mL/min (by C-G formula based on SCr of 0.63 mg/dL). Liver Function Tests: No results for input(s): AST, ALT, ALKPHOS, BILITOT, PROT, ALBUMIN in the last 168 hours. No results for input(s): LIPASE, AMYLASE in the last 168 hours. No results for input(s): AMMONIA in the last 168 hours. Coagulation Profile: Recent Labs  Lab 04/02/19 0625  INR 1.1   Cardiac Enzymes: No results for input(s): CKTOTAL, CKMB, CKMBINDEX, TROPONINI in the last 168 hours. BNP (last 3 results) No results for input(s): PROBNP in the last 8760 hours. HbA1C: Recent Labs    03/31/19 0607  HGBA1C 5.6   CBG: No results for input(s): GLUCAP in the last 168 hours. Lipid Profile: Recent Labs    03/31/19 0607  CHOL 201*  HDL 44  LDLCALC 138*  TRIG 93  CHOLHDL 4.6   Thyroid Function Tests: No results for input(s): TSH, T4TOTAL, FREET4, T3FREE, THYROIDAB in the last 72 hours. Anemia Panel: No results for input(s): VITAMINB12, FOLATE, FERRITIN, TIBC, IRON, RETICCTPCT in the last 72 hours. Sepsis Labs: No results for input(s):  PROCALCITON, LATICACIDVEN in the last 168 hours.  Recent Results (from the past 240 hour(s))  SARS CORONAVIRUS 2 (TAT 6-24 HRS) Nasopharyngeal Nasopharyngeal Swab     Status: None   Collection Time: 03/31/19  4:35 AM   Specimen: Nasopharyngeal Swab  Result Value Ref Range Status   SARS Coronavirus 2 NEGATIVE NEGATIVE Final    Comment: (NOTE) SARS-CoV-2 target nucleic acids are NOT DETECTED. The SARS-CoV-2 RNA is generally detectable in upper and lower respiratory specimens during the acute phase of infection. Negative results do not preclude SARS-CoV-2 infection, do not rule out co-infections with other pathogens, and should not be used as the sole basis for treatment or other patient management decisions. Negative results must be combined with clinical observations, patient history, and epidemiological information. The expected result is Negative. Fact Sheet for Patients: SugarRoll.be Fact Sheet for Healthcare Providers: https://www.woods-mathews.com/ This test is not yet approved or cleared by the [place] FDA and  has been authorized for detection and/or diagnosis of SARS-CoV-2 by FDA under an Emergency Use Authorization (EUA). This EUA will remain  in effect (meaning this test can be used) for the duration of the COVID-19 declaration under Section 56 4(b)(1) of the Act, 21 U.S.C. section 360bbb-3(b)(1), unless the authorization is terminated or revoked sooner. Performed at [facility], [place] [address] [place], [place] [address]       Radiology Studies: No results found.  Scheduled Meds: . [MAR Hold] aspirin EC  325 mg Oral Daily  . [MAR Hold] atorvastatin  40 mg Oral q1800  . [MAR Hold] cholecalciferol  1,000 Units Oral Daily  . [MAR Hold] docusate sodium  100 mg Oral Daily  . [MAR Hold] gabapentin  300 mg Oral Daily  . [MAR Hold] pantoprazole  40 mg Oral Daily  . [MAR Hold] sodium chloride flush  3 mL Intravenous  Once  . [MAR Hold] sodium chloride flush  3 mL Intravenous Q12H  . [MAR Hold] traZODone  100 mg Oral QHS  . [MAR Hold] venlafaxine XR  75 mg Oral Q breakfast  . [MAR Hold] vitamin B-12  250 mcg Oral Daily   Continuous Infusions: . sodium chloride 75 mL/hr at 04/01/19 1427  . sodium chloride    . sodium chloride 1 mL/kg/hr (04/02/19 0535)  . heparin Stopped (04/02/19 0630)     LOS: 1 day   Time spent: 29 minutes   Darliss Cheney, MD [facility]  04/02/2019, 9:16 AM   To contact the attending provider between 7A-7P or the covering provider during after hours 7P-7A, please log into the web site [email or web address] and use password [email or web address].

## 2019-04-02 NOTE — Progress Notes (Signed)
VASCULAR LAB PRELIMINARY  PRELIMINARY  PRELIMINARY  PRELIMINARY  Carotid duplex completed.    Preliminary report:  See CV proc for preliminary results.  Princessa Lesmeister, RVT 04/02/2019, 6:51 PM

## 2019-04-02 NOTE — Brief Op Note (Signed)
BRIEF CARDIAC CATHETERIZATION NOTE  04/02/2019  9:03 AM  PATIENT:  [name]  63 y.o. female  PRE-OPERATIVE DIAGNOSIS:  Unstable angina and severe aortic stenosis  POST-OPERATIVE DIAGNOSIS:  Severe aortic stenosis  PROCEDURE:  Procedure(s): RIGHT/LEFT HEART CATH AND CORONARY ANGIOGRAPHY (N/A)  SURGEON:  Surgeon(s) and Role:    Nelva Bush, MD - Primary  FINDINGS: 1. No angiographically significant CAD. 2. Severe aortic stenosis (mean gradient 65 mmHg). 3. Moderately to severely elevated left ventricular filling pressure. 4. Moderately reduced cardiac output.  RECOMMENDATIONS: 1. Medical therapy, including careful blood pressure control and diuresis. 2. Valve team consultation for further workup/management of severe aortic stenosis.  Nelva Bush, MD [facility] Pager: [phone]

## 2019-04-02 NOTE — Progress Notes (Signed)
[place] for heparin Indication: chest pain/ACS  Allergies  Allergen Reactions  . Flagyl [Metronidazole] Anaphylaxis  . Amoxicillin Swelling  . Other Nausea Only    Tylenol with Codeine #3  . Percocet [Oxycodone-Acetaminophen] Nausea Only  . Plaquenil [Hydroxychloroquine Sulfate] Other (See Comments)    Blurred vision, joint pain  . Sulfa Antibiotics Nausea Only    Patient Measurements: Height: 5\' 8"  (172.7 cm) Weight: 266 lb 12.1 oz (121 kg) IBW/kg (Calculated) : 63.9 Heparin Dosing Weight: 89.9 kg  Vital Signs: Temp: 98.5 F (36.9 C) (10/19 1019) Temp Source: Oral (10/19 1019) BP: 102/64 (10/19 1022) Pulse Rate: 74 (10/19 1022)  Labs: Recent Labs    03/30/19 1858  03/31/19 0607 03/31/19 0850 03/31/19 1113  03/31/19 2000 04/01/19 0601 04/02/19 0625  HGB 14.8  --   --   --   --   --   --  14.0 13.5  HCT 44.5  --   --   --   --   --   --  41.1 41.7  PLT 296  --   --   --   --   --   --  273 228  LABPROT  --   --   --   --   --   --   --   --  14.4  INR  --   --   --   --   --   --   --   --  1.1  HEPARINUNFRC  --   --   --   --   --    < > 0.40 0.59 0.73*  CREATININE 0.64  --   --   --   --   --   --   --  0.63  TROPONINIHS 41*   < > 50* 51* 49*  --   --   --   --    < > = values in this interval not displayed.    Estimated Creatinine Clearance: 96 mL/min (by C-G formula based on SCr of 0.63 mg/dL).  Assessment: 54 yof presented to the ED with CP, positive trops. Plans are for cath 10/19. No AC noted PTA.   Heparin level this AM is slightly above goal on 1350 units/hour, but turned off for cath lab. H&H is stable and plts are wnl.   Goal of Therapy:  Heparin level 0.3-0.7 units/ml Monitor platelets by anticoagulation protocol: Yes   Plan:  -S/p cath lab, no significant CAD -Heparin not resumed s/p cath.  Marguerite Olea, [facility] Clinical Pharmacist Phone [phone]  04/02/2019 10:35 AM

## 2019-04-02 NOTE — Interval H&P Note (Signed)
History and Physical Interval Note:  04/02/2019 7:44 AM  [name]  has presented today for cardiac catheterization, with the diagnosis of unstable angina and aortic stenosis.  The various methods of treatment have been discussed with the patient and family. After consideration of risks, benefits and other options for treatment, the patient has consented to  Procedure(s): RIGHT/LEFT HEART CATH AND CORONARY ANGIOGRAPHY (N/A) as a surgical intervention.  The patient's history has been reviewed, patient examined, no change in status, stable for surgery.  I have reviewed the patient's chart and labs.  Questions were answered to the patient's satisfaction.    Cath Lab Visit (complete for each Cath Lab visit)  Clinical Evaluation Leading to the Procedure:   ACS: Yes.    Non-ACS:  N/A  [name]

## 2019-04-02 NOTE — Progress Notes (Addendum)
Site area: Right groin a 6 french arterial and 7 french venous sheath was removed  Site Prior to Removal:  Level 0  Pressure Applied For 20 MINUTES    Bedrest Beginning at 1000am X 4 hours  Manual:   Yes.    Patient Status During Pull:  stable  Post Pull Groin Site:  Level 0  Post Pull Instructions Given:  Yes.    Post Pull Pulses Present:  Yes.    Dressing Applied:  Yes.    Comments:

## 2019-04-02 NOTE — Progress Notes (Signed)
Progress Note  Patient Name: [name] Date of Encounter: 04/02/2019  Primary Cardiologist: Minus Breeding, MD   Subjective   Underwent coronary angiography earlier today.  She ruled out for myocardial infarction.  Cath results are noted below.  Inpatient Medications    Scheduled Meds: . [MAR Hold] aspirin EC  325 mg Oral Daily  . [MAR Hold] atorvastatin  40 mg Oral q1800  . [MAR Hold] cholecalciferol  1,000 Units Oral Daily  . [MAR Hold] docusate sodium  100 mg Oral Daily  . [MAR Hold] gabapentin  300 mg Oral Daily  . [MAR Hold] pantoprazole  40 mg Oral Daily  . [MAR Hold] sodium chloride flush  3 mL Intravenous Once  . [MAR Hold] sodium chloride flush  3 mL Intravenous Q12H  . [MAR Hold] traZODone  100 mg Oral QHS  . [MAR Hold] venlafaxine XR  75 mg Oral Q breakfast  . [MAR Hold] vitamin B-12  250 mcg Oral Daily   Continuous Infusions: . sodium chloride 75 mL/hr at 04/01/19 1427  . sodium chloride    . sodium chloride 1 mL/kg/hr (04/02/19 0535)  . heparin Stopped (04/02/19 0630)   PRN Meds: sodium chloride, [MAR Hold] acetaminophen, [MAR Hold] ALPRAZolam, [MAR Hold]  morphine injection, [MAR Hold] nitroGLYCERIN, [MAR Hold] ondansetron (ZOFRAN) IV, sodium chloride flush   Vital Signs    Vitals:   04/02/19 0940 04/02/19 0945 04/02/19 0950 04/02/19 0955  BP: 139/63 131/75 128/64 (!) 103/49  Pulse: 71 68 70 72  Resp: (!) 0 (!) 22 16 17   Temp:      TempSrc:      SpO2: 91% 94% 94% 92%  Weight:      Height:        Intake/Output Summary (Last 24 hours) at 04/02/2019 1017 Last data filed at 04/02/2019 0716 Gross per 24 hour  Intake 780 ml  Output 2750 ml  Net -1970 ml   Last 3 Weights 04/02/2019 04/01/2019 03/31/2019  Weight (lbs) 266 lb 12.1 oz 262 lb 250 lb 14.1 oz  Weight (kg) 121 kg 118.842 kg 113.8 kg      Telemetry    Normal sinus rhythm- Personally Reviewed  ECG    Normal sinus rhythm, left ventricular hypertrophy with strain, and normal  PR interval.  EKG performed 03/31/2019- Personally Reviewed  Physical Exam  Morbid obesity GEN: No acute distress.   Neck: No JVD Cardiac: RRR, there is a 3/6 right upper sternal border systolic aortic stenosis murmur.  No ejection click, rubs, or gallops.  Respiratory: Clear to auscultation bilaterally. GI: Soft, nontender, non-distended  MS: No edema; No deformity. Neuro:  Nonfocal  Psych: Normal affect   Labs    High Sensitivity Troponin:   Recent Labs  Lab 03/30/19 1858 03/30/19 2134 03/31/19 0607 03/31/19 0850 03/31/19 1113  TROPONINIHS 41* 46* 50* 51* 49*      Chemistry Recent Labs  Lab 03/30/19 1858 04/02/19 0625  NA 133* 140  K 4.1 3.7  CL 98 108  CO2 25 21*  GLUCOSE 103* 110*  BUN 13 9  CREATININE 0.64 0.63  CALCIUM 9.1 8.3*  GFRNONAA >60 >60  GFRAA >60 >60  ANIONGAP 10 11     Hematology Recent Labs  Lab 03/30/19 1858 04/01/19 0601 04/02/19 0625  WBC 7.7 11.3* 6.9  RBC 4.69 4.42 4.30  HGB 14.8 14.0 13.5  HCT 44.5 41.1 41.7  MCV 94.9 93.0 97.0  MCH 31.6 31.7 31.4  MCHC 33.3 34.1 32.4  RDW 13.2  13.1 13.4  PLT 296 273 228    BNPNo results for input(s): BNP, PROBNP in the last 168 hours.   DDimer No results for input(s): DDIMER in the last 168 hours.   Radiology    No results found.  Cardiac Studies   2D Doppler echocardiogram 03/31/2019: IMPRESSIONS    1. Limited images.  2. Left ventricular ejection fraction, by visual estimation, is 55%. The left ventricle has normal function. There is mildly increased left ventricular hypertrophy.  3. Definity contrast agent was given IV to delineate the left ventricular endocardial borders.  4. Left ventricular diastolic Doppler parameters are consistent with impaired relaxation pattern of LV diastolic filling.  5. Global right ventricle was not well visualized.The right ventricular size is not well visualized. Right vetricular wall thickness was not assessed.  6. Left atrial size was normal.   7. Right atrial size was normal.  8. Mild mitral annular calcification.  9. The mitral valve is grossly normal. No evidence of mitral valve regurgitation. 10. The tricuspid valve is not well visualized. Tricuspid valve regurgitation is trivial. 11. Aortic valve mean gradient measures 71.8 mmHg. 12. The aortic valve was not well visualized but is calcified with restricted motion. Aortic valve regurgitation is mild by color flow Doppler. Severe aortic valve stenosis is suggested by gradients. Suggest TEE to further assess aortic valve structure. 13. The pulmonic valve was not well visualized. Pulmonic valve regurgitation was not assessed by color flow Doppler. 14. Aortic dilatation noted. 15. There is mild dilatation of the ascending aorta. 16. The inferior vena cava is normal in size with greater than 50% respiratory variability, suggesting right atrial pressure of 3 mmHg. 17. The interatrial septum was not well visualized. 18. TR signal is inadequate for assessing pulmonary artery systolic pressure.  Cardiac catheterization results 04/02/2019 FINDINGS: 1. No angiographically significant CAD. 2. Severe aortic stenosis (mean gradient 65 mmHg). 3. Moderately to severely elevated left ventricular filling pressure. 4. Moderately reduced cardiac output.  RECOMMENDATIONS: 1. Medical therapy, including careful blood pressure control and diuresis. 2. Valve team consultation for further workup/management of severe aortic stenosis.  Patient Profile     63 y.o. female  admitted with chest pain and found to have severe aortic stenosis, widely patent coronary arteries, HLD, GERD, wheelchair bound due to hx of polio, and depression.   Assessment & Plan    1. Chest pain, not related to obstructive coronary disease.  Possibly demand ischemia in the setting of severe aortic stenosis 2. Severe calcific aortic stenosis, I will give the heart valve team involved. 3. Acute on chronic diastolic heart  failure 4. Borderline systolic blood pressures, due to critical aortic stenosis.  We will get the heart valve team involved.  Agree with Dr. Domenic Gonzales that surgery may not be her best option since she is wheelchair-bound.  She does not have coronary disease and could potentially have minimally invasive aortic valve replacement.  Heart valve team will help sort this out.  Plan Home today or within the next few days depending upon the recommendations from the heart team.  Discussed with Nell Range, PA-C.      For questions or updates, please contact [place] Please consult [email or web address] for contact info under        Signed, Sinclair Grooms, MD  04/02/2019, 10:17 AM

## 2019-04-02 NOTE — Consult Note (Addendum)
[facility] VALVE TEAM  Inpatient TAVR Consultation:   Patient ID: [name]; [id number]; 12/31/1955   Admit date: 03/30/2019 Date of Consult: 04/03/2019  Primary Care Provider: [name], MD Primary Cardiologist: Dr. Percival Spanish (last seen 2015)   Patient Profile:   [name] is a 63 y.o. 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 Tamala Julian.  History of Present Illness:   [name] lives alone in [place].  She has 5 siblings and many nieces and nephews that live locally.  The patient suffered from polio at 76 months old and has had subsequent orthopedic issues and has been wheelchair-bound since 2000.  Despite this, she has remained very active and totally independent.  She drives a car and takes care of all of her own ADLs. She is able to transfer herself from her wheelchair independently. 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  Over the past year she has slowed down quite a bit due to worsening fatigue and stamina. Of note, she has all of her natural teeth and regularly sees a dentist with no active dental issues.  She has no past cardiac issues but has been known to have aortic stenosis for at least 5 years. She was seen by Dr. Percival Spanish in 2015 for evaluation of LE edema. He felt this was primarily related to her non ambulatory status and venous insufficiency. She preferred to avoid diuretics given issues with getting to bathroom. Follow up echo 12/2013 showed EF 55-60% with mild-mod AS (mean gradient 24 mm Hg, AVA 1.8cm2)  although study was technically difficult.   She was in her usual state of health until the summer of 2019 when she noted that she could not keep up with her normal activities without "giving out." She has developed profound fatigue, which was unusual for her. She has noticed some mild shortness of breath this past spring that she thinks may have been worse laying flat. She also has had occasional dizziness that occured after wheelchair transfers and once after doing chair yoga. No syncope. She has mild LE edema (worse in left leg) that has been chronic. She has had intermittent chest pain over the past several months which she has largely ignored.  She was in her usual state of health until 03/29/19 PM when she had sudden onset of severe chest pain that radiated from her back to her chest while talking to her friend on the phone. She described it as 10/10 pain that was sharp and radiated to her jaw. She almost called 911 but it improved after 5 minutes and she was able to sleep.   She was seen by her PCP the following day where she had an abnormal ECG and she was sent to the [facility] ER.   In the ER, EKG showed sinus with lateral and anterolateral ST-T wave abnormalities, more prominent anterolaterally compared to last tracings. Lab work showed potassium 4.1, BUN 13, creatinine 0.64, high-sensitivity troponin 41---> 46--> 50--> 51 --> 49. LDL 138, hemoglobin 14.8, platelets 296. CT angio chest/abdomen/pelvis showed no acute findings.  Incidentally, she was noted  to have thoracoabdominal atherosclerosis and valvular calcifications. Repeat echo showed severe AS with EF 55%, mild LVH, severe AS with mean gradient 71.8 mm Hg, peak gradient 105 mm Hg, DVI .014, mod AI. Due to poor image quality, TEE was recommended. [facility] 04/02/19 showed normal cors, severe aortic stenosis (mean gradient 65 mmHg, calculated valve area 0.5 cm^2), moderately to severely elevated left ventricular filling pressure, mildly  elevated right and pulmonary artery pressures and moderately reduced cardiac output. Medical therapy including careful blood pressure control and diuresis were recommended. The multidisciplinary valve team is consulted for consideration of TAVR.   She is seen laying in bed today. Her sister is present. The patient currently denies any chest pain, SOB, orthopnea or PND.    Past Medical History:  Diagnosis Date   Anxiety    Arthritis    low back   Cervical pain (neck)    Connective tissue disease ([place])    questionable Lupus (she has yet to see a rheumatologist)   Depression    Diverticulitis    GERD (gastroesophageal reflux disease)    History of fracture of left hip    Hyperlipidemia    Insomnia    Morbid obesity (HCC)    OSA on CPAP    Peripheral edema    Post-polio syndrome    With left leg paralysis   Post-viral reactive airway disease    Severe aortic stenosis    Sigmoid diverticulosis     Past Surgical History:  Procedure Laterality Date   ANKLE FUSION     left   APPENDECTOMY     CHOLECYSTECTOMY     KNEE ARTHROPLASTY     LEG SURGERY     muscle surgery related to polio   RIGHT/LEFT HEART CATH AND CORONARY ANGIOGRAPHY N/A 04/02/2019   Procedure: RIGHT/LEFT HEART CATH AND CORONARY ANGIOGRAPHY;  Surgeon: Nelva Bush, MD;  Location: [facility] LAB;  Service: Cardiovascular;  Laterality: N/A;   TOTAL HIP ARTHROPLASTY     right x 3   UMBILICAL HERNIA REPAIR       Inpatient Medications: Scheduled Meds:  atorvastatin  40 mg Oral q1800   cholecalciferol  1,000 Units Oral Daily   docusate sodium  100 mg Oral Daily   enoxaparin (LOVENOX) injection  40 mg Subcutaneous Q24H   gabapentin  300 mg Oral Daily   pantoprazole  40 mg Oral Daily   sodium chloride flush  10-40 mL Intracatheter Q12H   sodium chloride flush  3 mL Intravenous Once   sodium chloride flush  3 mL Intravenous Q12H   sodium chloride flush  3 mL Intravenous Q12H    traZODone  100 mg Oral QHS   venlafaxine XR  75 mg Oral Q breakfast   vitamin B-12  250 mcg Oral Daily   Continuous Infusions:  sodium chloride     PRN Meds: sodium chloride, acetaminophen, ALPRAZolam, morphine injection, ondansetron (ZOFRAN) IV, sodium chloride flush, sodium chloride flush  Allergies:    Allergies  Allergen Reactions   Flagyl [Metronidazole] Anaphylaxis   Amoxicillin Swelling   Other Nausea Only    Tylenol with Codeine #3   Percocet [Oxycodone-Acetaminophen] Nausea Only   Plaquenil [Hydroxychloroquine Sulfate] Other (See Comments)    Blurred vision, joint pain   Sulfa Antibiotics Nausea Only    Social History:   Social History   Socioeconomic History   Marital status: Single    Spouse name: Not on file   Number of children: Not on file   Years of  education: Not on file   Highest education level: Not on file  Occupational History   Not on file  Social Needs   Financial resource strain: Not on file   Food insecurity    Worry: Not on file    Inability: Not on file   Transportation needs    Medical: Not on file    Non-medical: Not on file  Tobacco Use   Smoking status: Never Smoker   Smokeless tobacco: Never Used  Substance and Sexual Activity   Alcohol use: No   Drug use: Not on file   Sexual activity: Not on file  Lifestyle   Physical activity    Days per week: Not on file    Minutes per session: Not on file   Stress: Not on file  Relationships   Social connections    Talks on phone: Not on file    Gets together: Not on file    Attends religious service: Not on file    Active member of club or organization: Not on file    Attends meetings of clubs or organizations: Not on file    Relationship status: Not on file   Intimate partner violence    Fear of current or ex partner: Not on file    Emotionally abused: Not on file    Physically abused: Not on file    Forced sexual activity: Not on file  Other Topics  Concern   Not on file  Social History Narrative   Lives alone.    Family History:   The patient's family history includes CAD in an other family member; Cancer in her mother; Diabetes in an other family member; Heart disease in her father; Hypertension in an other family member.  ROS:  Please see the history of present illness.  ROS  All other ROS reviewed and negative.     Physical Exam/Data:   Vitals:   04/02/19 1300 04/03/19 0030 04/03/19 0446 04/03/19 0452  BP: 97/78 (!) 98/58  (!) 117/47  Pulse: 79 79  82  Resp:  18  17  Temp:  98.8 F (37.1 C)  98.5 F (36.9 C)  TempSrc:  Oral  Oral  SpO2:  92%  93%  Weight:   120.3 kg   Height:        Intake/Output Summary (Last 24 hours) at 04/03/2019 0839 Last data filed at 04/03/2019 0600 Gross per 24 hour  Intake 360 ml  Output 900 ml  Net -540 ml   Filed Weights   04/01/19 0530 04/02/19 0446 04/03/19 0446  Weight: 118.8 kg 121 kg 120.3 kg   Body mass index is 40.34 kg/m.  General:  Well nourished, well developed, in no acute distress, obese  HEENT: normal Lymph: no adenopathy Neck: no JVD Endocrine:  No thryomegaly Cardiac:  normal S1, S2; RRR; 3/6 SEM loudest @ RUSB. Lungs:  clear to auscultation bilaterally, no wheezing, rhonchi or rales  Abd: soft, nontender, no hepatomegaly  Ext: mild bilateral LE edema Musculoskeletal:  No deformities, BUE and BLE strength normal and equal Skin: warm and dry  Neuro:  CNs 2-12 intact, no focal abnormalities noted Psych:  Normal affect   EKG:  The EKG was personally reviewed and demonstrates:  03/31/19 sinus HR 65, non specific TWIs in inferolateral leads. Early R wave transition.  Telemetry:  Telemetry was personally reviewed and demonstrates: sinus  Relevant CV Studies: Echo 03/31/19 IMPRESSIONS  1. Limited images.  2. Left ventricular ejection fraction, by visual estimation, is  55%. The left ventricle has normal function. There is mildly increased left ventricular  hypertrophy.  3. Definity contrast agent was given IV to delineate the left ventricular endocardial borders.  4. Left ventricular diastolic Doppler parameters are consistent with impaired relaxation pattern of LV diastolic filling.  5. Global right ventricle was not well visualized.The right ventricular size is not well visualized. Right vetricular wall thickness was not assessed.  6. Left atrial size was normal.  7. Right atrial size was normal.  8. Mild mitral annular calcification.  9. The mitral valve is grossly normal. No evidence of mitral valve regurgitation. 10. The tricuspid valve is not well visualized. Tricuspid valve regurgitation is trivial. 11. Aortic valve mean gradient measures 71.8 mmHg. 12. The aortic valve was not well visualized but is calcified with restricted motion. Aortic valve regurgitation is mild by color flow Doppler. Severe aortic valve stenosis is suggested by gradients. Suggest TEE to further assess aortic valve structure. 13. The pulmonic valve was not well visualized. Pulmonic valve regurgitation was not assessed by color flow Doppler. 14. Aortic dilatation noted. 15. There is mild dilatation of the ascending aorta. 16. The inferior vena cava is normal in size with greater than 50% respiratory variability, suggesting right atrial pressure of 3 mmHg. 17. The interatrial septum was not well visualized. 18. TR signal is inadequate for assessing pulmonary artery systolic pressure.  FINDINGS  Left Ventricle: Left ventricular ejection fraction, by visual estimation, is 55%. The left ventricle has normal function. Definity contrast agent was given IV to delineate the left ventricular endocardial borders. There is mildly increased left  ventricular hypertrophy. Spectral Doppler shows Left ventricular diastolic Doppler parameters are consistent with impaired relaxation pattern of LV diastolic filling.  Right Ventricle: The right ventricular size is not well visualized.  Right vetricular wall thickness was not assessed. Global RV systolic function is was not well visualized.  Left Atrium: Left atrial size was normal in size.  Right Atrium: Right atrial size was normal in size  Pericardium: There is no evidence of pericardial effusion.  Mitral Valve: The mitral valve is grossly normal. Mild mitral annular calcification. No evidence of mitral valve regurgitation.  Tricuspid Valve: The tricuspid valve is not well visualized. Tricuspid valve regurgitation is trivial by color flow Doppler.  Aortic Valve: The aortic valve was not well visualized. Aortic valve regurgitation is mild by color flow Doppler. Severe aortic stenosis is present. Aortic valve mean gradient measures 71.8 mmHg. Aortic valve peak gradient measures 105.0 mmHg.  Pulmonic Valve: The pulmonic valve was not well visualized. Pulmonic valve regurgitation was not assessed by color flow Doppler.  Aorta: Aortic dilatation noted. There is mild dilatation of the ascending aorta.  Venous: The inferior vena cava is normal in size with greater than 50% respiratory variability, suggesting right atrial pressure of 3 mmHg.  IAS/Shunts: The interatrial septum was not well visualized.       Diastology LV e' lateral:   3.57 cm/s LV E/e' lateral: 16.6 LV e' medial:    4.26 cm/s LV E/e' medial:  13.9    LEFT ATRIUM           Index LA Vol (A4C): 52.3 ml 23.27 ml/m  AORTIC VALVE AV Vmax:           512.40 cm/s AV Vmean:          408.600 cm/s AV VTI:            1.300 m AV Peak Grad:  105.0 mmHg AV Mean Grad:      71.8 mmHg LVOT Vmax:         73.10 cm/s LVOT Vmean:        53.900 cm/s LVOT VTI:          0.186 m LVOT/AV VTI ratio: 0.14  MITRAL VALVE MV Area (PHT): 2.26 cm              SHUNTS MV PHT:        97.15 msec            Systemic VTI: 0.19 m MV Decel Time: 335 msec MV E velocity: 59.10 cm/s  103 cm/s MV A velocity: 101.00 cm/s 70.3 cm/s MV E/A ratio:  0.59         1.5  ____________________   [facility] 04/02/19 Conclusions: 1. No angiographically significant CAD. 2. Severe aortic stenosis (mean gradient 65 mmHg, calculated valve area 0.5 cm^2). 3. Moderately to severely elevated left ventricular filling pressure. 4. Mildly elevated right and pulmonary artery pressures. 5. Moderately reduced cardiac output.  Recommendations: 1. Medical therapy, including careful blood pressure control and diuresis. 2. Valve team consultation for further workup/management of severe aortic stenosis.    Laboratory Data:  Chemistry Recent Labs  Lab 03/30/19 1858 04/02/19 0625 04/02/19 0831 04/03/19 0449  NA 133* 140 142   142 140  K 4.1 3.7 3.9   3.8 3.7  CL 98 108  --  104  CO2 25 21*  --  25  GLUCOSE 103* 110*  --  122*  BUN 13 9  --  8  CREATININE 0.64 0.63  --  0.53  CALCIUM 9.1 8.3*  --  9.0  GFRNONAA >60 >60  --  >60  GFRAA >60 >60  --  >60  ANIONGAP 10 11  --  11    No results for input(s): PROT, ALBUMIN, AST, ALT, ALKPHOS, BILITOT in the last 168 hours. Hematology Recent Labs  Lab 04/01/19 0601 04/02/19 0625 04/02/19 0831 04/03/19 0449  WBC 11.3* 6.9  --  8.0  RBC 4.42 4.30  --  4.33  HGB 14.0 13.5 13.3   13.3 13.5  HCT 41.1 41.7 39.0   39.0 40.9  MCV 93.0 97.0  --  94.5  MCH 31.7 31.4  --  31.2  MCHC 34.1 32.4  --  33.0  RDW 13.1 13.4  --  13.2  PLT 273 228  --  279   Cardiac EnzymesNo results for input(s): TROPONINI in the last 168 hours. No results for input(s): TROPIPOC in the last 168 hours.  BNPNo results for input(s): BNP, PROBNP in the last 168 hours.  DDimer No results for input(s): DDIMER in the last 168 hours.  Radiology/Studies:  Dg Chest 2 View  Result Date: 03/30/2019 CLINICAL DATA:  Acute chest pain for 1 day. EXAM: CHEST - 2 VIEW COMPARISON:  09/25/2017 FINDINGS: The cardiomediastinal silhouette is unremarkable. There is no evidence of focal airspace disease, pulmonary edema, suspicious pulmonary nodule/mass,  pleural effusion, or pneumothorax. No acute bony abnormalities are identified. IMPRESSION: No active cardiopulmonary disease. Electronically Signed   By: Margarette Canada M.D.   On: 03/30/2019 19:28   Ct Angio Chest/abd/pel For Dissection W And/or Wo Contrast  Result Date: 03/31/2019 CLINICAL DATA:  Chest/back pain, acute, aortic dissection suspect EXAM: CT ANGIOGRAPHY CHEST, ABDOMEN AND PELVIS TECHNIQUE: Multidetector CT imaging through the chest, abdomen and pelvis was performed using the standard protocol during bolus administration of intravenous contrast. Multiplanar reconstructed images and MIPs  were obtained and reviewed to evaluate the vascular anatomy. CONTRAST:  165mL OMNIPAQUE IOHEXOL 350 MG/ML SOLN COMPARISON:  Chest radiograph yesterday. Abdominopelvic CT 03/31/2016 FINDINGS: CTA CHEST FINDINGS Cardiovascular: Thoracic aorta is normal in caliber. No aortic dissection, acute aortic syndrome or aneurysm. Conventional branching pattern from the aortic arch. Aortic valvular calcifications. No central pulmonary embolus to the lobar level. Heart is normal in size. No pericardial effusion. Mediastinum/Nodes: Calcified right hilar nodes consistent with prior granulomatous disease. No suspicious noncalcified adenopathy. Subcentimeter left thyroid nodule does not meet size criteria for further evaluation. Patulous esophagus. Small hiatal hernia. Lungs/Pleura: Heterogeneous pulmonary parenchyma. 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 Clustered nodules in the subpleural right middle lobe, series 8, image 104, chronic and unchanged from 2017 abdominal CT. No pulmonary edema. No pleural fluid. The trachea and mainstem bronchi are patent. Musculoskeletal: Mild multilevel degenerative change in the spine. There are no acute or suspicious osseous abnormalities. Degenerative change in the right shoulder. Review of the MIP images confirms the above  findings. CTA ABDOMEN AND PELVIS FINDINGS VASCULAR Aorta: Normal caliber aorta without aneurysm, dissection, vasculitis or significant stenosis. Mild atherosclerosis. Celiac: Patent without evidence of aneurysm, dissection, vasculitis or significant stenosis. SMA: Patent without evidence of aneurysm, dissection, vasculitis or significant stenosis. Renals: Both renal arteries are patent without evidence of aneurysm, dissection, vasculitis, fibromuscular dysplasia or significant stenosis. IMA: Patent without evidence of aneurysm, dissection, vasculitis or significant stenosis. Inflow: Patent without evidence of aneurysm, dissection, vasculitis or significant stenosis. Veins: Limited assessment on this arterial phase exam. Review of the MIP images confirms the above findings. NON-VASCULAR Hepatobiliary: No focal liver abnormality is seen. Status post cholecystectomy. No biliary dilatation. Pancreas: No ductal dilatation or inflammation. Spleen: Normal in size and arterial enhancement. Adrenals/Urinary Tract: No adrenal nodule. Suggestion of slight left renal atrophy. No hydronephrosis. Tiny cortical hypodensities in the left kidney are too small to characterize. Urinary bladder partially distended. No bladder wall thickening. Stomach/Bowel: Small hiatal hernia. Stomach is decompressed and not well assessed. No small bowel wall thickening or inflammatory change. Prior appendectomy with surgical clips in the right lower quadrant. Small volume of stool throughout the colon. Colonic diverticulosis from the splenic flexure distally, prominent in the sigmoid. No evidence of diverticulitis. Lymphatic: No abdominopelvic adenopathy. Reproductive: Status post hysterectomy. No adnexal masses. Other: Postsurgical change of the right anterior abdominal wall with mesh. Fat containing umbilical hernia. No free air or free fluid. Musculoskeletal: Right hip arthroplasty. Degenerative change in the spine with multilevel endplate  changes. There are no acute or suspicious osseous abnormalities. Review of the MIP images confirms the above findings. IMPRESSION: 1. No aortic dissection or acute aortic abnormality. Mild thoracoabdominal aortic atherosclerosis. Aortic valvular calcifications, consider further evaluation with echocardiogram. 2. No acute abnormality in the chest, abdomen, or pelvis. 3. Incidental colonic diverticulosis without diverticulitis. Small hiatal hernia. 4. Right abdominal wall hernia repair with mesh. Fat containing umbilical hernia. Aortic Atherosclerosis (ICD10-I70.0). Electronically Signed   By: Keith Rake M.D.   On: 03/31/2019 02:42   Vas US Carotid  Result Date: 04/02/2019 Carotid Arterial Duplex Study Risk Factors:      Hyperlipidemia. Other Factors:     Aortic valve disease. Pre Operative TAVR. History of Polio. Limitations        Today's exam was limited due to the body habitus of the                    patient. Comparison  Study:  No prior study on file for comparison Performing Technologist: Sharion Dove RVS  Examination Guidelines: A complete evaluation includes B-mode imaging, spectral Doppler, color Doppler, and power Doppler as needed of all accessible portions of each vessel. Bilateral testing is considered an integral part of a complete examination. Limited examinations for reoccurring indications may be performed as noted.  Right Carotid Findings: +----------+--------+--------+--------+------------------+------------------+             PSV cm/s EDV cm/s Stenosis Plaque Description Comments            +----------+--------+--------+--------+------------------+------------------+  CCA Prox   78       17                                   intimal thickening  +----------+--------+--------+--------+------------------+------------------+  CCA Distal 85       21                                   intimal thickening  +----------+--------+--------+--------+------------------+------------------+  ICA Prox    51       15                                                       +----------+--------+--------+--------+------------------+------------------+  ICA Distal 80       23                                   tortuous            +----------+--------+--------+--------+------------------+------------------+  ECA        72       12                                                       +----------+--------+--------+--------+------------------+------------------+ +----------+--------+-------+--------+-------------------+             PSV cm/s EDV cms Describe Arm Pressure (mmHG)  +----------+--------+-------+--------+-------------------+  Subclavian 183                                            +----------+--------+-------+--------+-------------------+ +---------+--------+--+--------+-+  Vertebral PSV cm/s 43 EDV cm/s 9  +---------+--------+--+--------+-+  Left Carotid Findings: +----------+--------+--------+--------+------------------+------------------+             PSV cm/s EDV cm/s Stenosis Plaque Description Comments            +----------+--------+--------+--------+------------------+------------------+  CCA Prox   69       23                                   intimal thickening  +----------+--------+--------+--------+------------------+------------------+  CCA Distal 74       23  intimal thickening  +----------+--------+--------+--------+------------------+------------------+  ICA Prox   80       24                                                       +----------+--------+--------+--------+------------------+------------------+  ICA Distal 66       18                                   tortuous            +----------+--------+--------+--------+------------------+------------------+  ECA        96       11                                                       +----------+--------+--------+--------+------------------+------------------+  +----------+--------+--------+--------+-------------------+             PSV cm/s EDV cm/s Describe Arm Pressure (mmHG)  +----------+--------+--------+--------+-------------------+  Subclavian 189                                             +----------+--------+--------+--------+-------------------+ +---------+--------+--+--------+--+  Vertebral PSV cm/s 71 EDV cm/s 31  +---------+--------+--+--------+--+  Summary: Right Carotid: The extracranial vessels were near-normal with only minimal wall                thickening or plaque. Left Carotid: The extracranial vessels were near-normal with only minimal wall               thickening or plaque. Vertebrals:  Bilateral vertebral arteries demonstrate antegrade flow. Subclavians: Normal flow hemodynamics were seen in bilateral subclavian              arteries. *See table(s) above for measurements and observations.     Preliminary      STS Risk Calculator:  Procedure: Isolated AVR  Risk of Mortality:  1.117%   Renal Failure:  0.668%   Permanent Stroke:  0.589%   Prolonged Ventilation:  3.726%   DSW Infection:  0.150%   Reoperation:  2.123%   Morbidity or Mortality:  6.480%   Short Length of Stay:  53.647%   Long Length of Stay:  2.254%    ___________________   [place]  [facility]12 04/02/2019  1 a. Ability to shower/bathe Slightly limited  1 b. Ability to walk 1 block Other, Did not do  1 c. Ability to hurry/jog Other, Did not do  2. Edema feet/ankles/legs 1-2 times a week  3. Limited by fatigue All of the time  4. Limited by dyspnea 1-2 times a week  5. Sitting up / on 3+ pillows Less than once a week  6. Limited enjoyment of life Limited quite a bit  7. Rest of life w/ symptoms Mostly dissatisfied  8 a. Participation in hobbies Limited quite a bit  8 b. Participation in chores Limited quite a bit  8 c. Visiting family/friends Limited quite a bit     Assessment and Plan:   [name] is  a 63 y.o.  female with symptoms of severe, stage D1 aortic stenosis with NYHA Class III symptoms. I have reviewed the patient's recent echocardiogram which is notable for preserved LV systolic function (EF [id number]) and severe aortic stenosis with peak gradient of 105 mm Hg and mean transvalvular gradient of 71.52mm Hg. The patient's dimensionless index is 0.14 and calculated aortic valve area not calculated. There is mild AI.   [facility] 04/02/19 showed normal cors, severe aortic stenosis (mean gradient 65 mmHg, AVA 0.5 cm^2), moderately to severely elevated left ventricular filling pressure, mildly elevated right and pulmonary artery pressures and moderately reduced cardiac output.   TEE was recommended due to poor images on TTE. However, upon review of CT scan and cath, her valve appears to be heavily calcified c/w severe AS, and we do not feel TEE is necessary.   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 She has remained quite active despite her disability. Over the past year she has experienced progressive fatigue, decreased exercise tolerance, dyspnea, and chest pain. She had a profound episode of chest pain leading up to her current admission.    I have reviewed the natural history of aortic stenosis with the patient. We have discussed the limitations of medical therapy and the poor prognosis associated with symptomatic aortic stenosis. We have reviewed potential treatment options, including palliative medical therapy, conventional surgical aortic valve replacement, and transcatheter aortic valve replacement. We discussed treatment options in the context of this patient's specific comorbid medical conditions.    The patient's predicted risk of mortality with conventional aortic valve replacement is 1.117% primarily based on her age and morbid obesity. 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 TAVR seems like a reasonable  treatment option for this patient pending formal cardiac surgical consultation. She would not likely be a good surgical candidate given morbid obesity and wheelchair bound status and need to use upper body to transfer. We discussed typical evaluation which will require a gated cardiac CTA evaluate cardiac anatomy. We will defer CT angio chest/abd/pelvis as she had a CT angio on admission that shows adequate peripheral vasculature. Cardiac CT to be done today. Carotid duplex showed no significant stenosis. Will defer PT evaluation since she is wheelchair bound.   Dr. Burt Knack to follow.     Signed, Angelena Form, PA-C  04/03/2019 8:39 AM  Patient seen, examined. Available data reviewed. Agree with findings, assessment, and plan as outlined by Nell Range, PA-C.  I have independently interviewed and examined the patient.  I agree with the findings and discussion outlined above.  The patient is a delightful 62 year old woman with history of polio who has been wheelchair-bound now for greater than 20 years.  She has developed severe, symptomatic stage D1 aortic stenosis with New York Heart Association functional class III symptoms of chest pain and shortness of breath.  I have reviewed her echo, cardiac catheterization, and CTA studies.  Cardiac catheterization is notable for the absence of obstructive CAD.  There is heavy calcification and restriction of the aortic valve leaflets seen on plain fluoroscopy.  The patient's echocardiogram demonstrates critical aortic stenosis with a mean transvalvular gradient of 72 mmHg and dimensionless index of 0.14.  The patient's peak transaortic velocity is greater than 5 m/s.  The aortic valve is poorly visualized but demonstrates heavy calcification.  Gated cardiac CTA demonstrates a bicuspid aortic valve with severe leaflet restriction and mildly dilated aortic root less than 4  cm in maximal diameter.  The patient initially had a CTA of the chest abdomen and pelvis  for evaluation of chest pain here in the hospital.  This demonstrates normal caliber and nondiseased abdominal aorta, bilateral iliac arteries, and bilateral femoral arteries.  The natural history of severe symptomatic aortic stenosis is reviewed with the patient in detail.  She clearly requires treatment with critical aortic stenosis and associated symptoms.  While her STS risk of mortality is low, it would be difficult for her to heal from a median sternotomy in the setting of her post polio syndrome and reliance on her upper body to stay functionally independent.  TAVR would be a reasonable treatment alternative.  She will undergo formal cardiac surgical evaluation with Dr. Cyndia Bent scheduled as an outpatient next week.  Her other preoperative studies have been completed during her hospitalization.  She appears to have suitable anatomy for TAVR via transfemoral approach if this is deemed the best treatment for her.  I think she is stable for hospital discharge.  She understands that if she has recurrent chest discomfort or progressive shortness of breath, she should not hesitate to seek immediate medical attention.  Sherren Mocha, M.D. 04/03/2019 2:08 PM

## 2019-04-03 ENCOUNTER — Encounter: Payer: Self-pay | Admitting: Physician Assistant

## 2019-04-03 ENCOUNTER — Inpatient Hospital Stay (HOSPITAL_COMMUNITY): Payer: Medicare Other

## 2019-04-03 DIAGNOSIS — I350 Nonrheumatic aortic (valve) stenosis: Secondary | ICD-10-CM

## 2019-04-03 LAB — CBC
HCT: 40.9 % (ref 36.0–46.0)
Hemoglobin: 13.5 g/dL (ref 12.0–15.0)
MCH: 31.2 pg (ref 26.0–34.0)
MCHC: 33 g/dL (ref 30.0–36.0)
MCV: 94.5 fL (ref 80.0–100.0)
Platelets: 279 10*3/uL (ref 150–400)
RBC: 4.33 MIL/uL (ref 3.87–5.11)
RDW: 13.2 % (ref 11.5–15.5)
WBC: 8 10*3/uL (ref 4.0–10.5)
nRBC: 0 % (ref 0.0–0.2)

## 2019-04-03 LAB — BASIC METABOLIC PANEL
Anion gap: 11 (ref 5–15)
BUN: 8 mg/dL (ref 8–23)
CO2: 25 mmol/L (ref 22–32)
Calcium: 9 mg/dL (ref 8.9–10.3)
Chloride: 104 mmol/L (ref 98–111)
Creatinine, Ser: 0.53 mg/dL (ref 0.44–1.00)
GFR calc Af Amer: >60 mL/min (ref >60)
GFR calc non Af Amer: >60 mL/min (ref >60)
Glucose, Bld: 122 mg/dL — ABNORMAL HIGH (ref 70–99)
Potassium: 3.7 mmol/L (ref 3.5–5.1)
Sodium: 140 mmol/L (ref 135–145)

## 2019-04-03 SURGERY — ECHOCARDIOGRAM, TRANSESOPHAGEAL
Anesthesia: Moderate Sedation

## 2019-04-03 MED ORDER — IOHEXOL 350 MG/ML SOLN
80.0000 mL | Freq: Once | INTRAVENOUS | Status: AC | PRN
  Administered 2019-04-03: 80 mL via INTRAVENOUS

## 2019-04-03 NOTE — Plan of Care (Signed)
  Problem: Clinical Measurements: Goal: Ability to maintain clinical measurements within normal limits will improve Outcome: Progressing   

## 2019-04-03 NOTE — Progress Notes (Addendum)
Pt asking for water or ice chips, per [employer] pa, pt can have a few ice chips but  NPO 6 hours pre procedure on, endo called and schedule for 3pm, few ice chips given and educated only now and then npo  K [place]spon PA advised no TEE today and pt may eat when she returns.

## 2019-04-03 NOTE — Progress Notes (Addendum)
    [facility] has been requested to perform a transesophageal echocardiogram on 10/20 for aortic stenosis.  After careful review of history and examination, the risks and benefits of transesophageal echocardiogram have been explained including risks of esophageal damage, perforation (1:10,000 risk), bleeding, pharyngeal hematoma as well as other potential complications associated with conscious sedation including aspiration, arrhythmia, respiratory failure and death. Alternatives to treatment were discussed, questions were answered. Patient is willing to proceed.   Rosaria Ferries, PA-C 04/03/2019 9:03 AM    After review of case with multidisciplinary ovale team, it was decided that TEE was necessary. This has been cancelled.  Angelena Form PA-C  [facility]

## 2019-04-03 NOTE — Progress Notes (Signed)
   Stable and able to lie flat.  CT heart performed.  Plan discharge after seen by Dr. Burt Knack.

## 2019-04-03 NOTE — Discharge Instructions (Signed)
Dear [name],  You have been referred to Dr. Cyndia Bent at [facility] for further TAVR evaluation. [address], [place], [place], [place] [address], [phone]  You are scheduled for surgical evaluation with Dr Cyndia Bent on 04/11/19 at 2:30 PM    Please call the office on the day of your appointment to make sure the surgeon will be there and on time as many times there are delays or rescheduling due to emergency surgery.     You are scheduled for Pre Admission Testing on Friday, 04/13/2019 at 1pm.  Please arrive in Admitting at [facility] (Main Entrance A, Valet Parking) at 12:45AM for check-in. No restrictions for this appointment. No one is allowed in with the patient for this visit due to Covid 19 restrictions. A support person can be conferenced in over the phone.    You are scheduled for drive thru Covid Swabbing (pre procedure lane) on Friday, 04/13/19 at 2:10 PM at the [facility] (old [facility]) [address]  After this appointment you will need to go home and quarantine until you return for surgery. You can attend medical appointments.    Tuesday, Nov 3rd, 2020 - TAVR: we have you tentatively scheduled for surgery this day. Theodosia Quay RN Navigator will reach out to you to give you specific times and instructions.   If you have any questions or concerns, please don't hesitate to call.  Sincerely, Angelena Form, PA-C

## 2019-04-03 NOTE — Discharge Summary (Signed)
Physician Discharge Summary  [name] [id number] DOB: [birth year] DOA: 03/30/2019  PCP: [name], MD  Admit date: 03/30/2019 Discharge date: 04/03/2019  Admitted From: Home Disposition: Home  Recommendations for Outpatient Follow-up:  1. Follow up with PCP in 1-2 weeks 2. Follow with cardiology/wound team as outpatient as per their arrangement 3. Please obtain BMP/CBC in one week 4. Please follow up on the following pending results:  Home Health: None Equipment/Devices: None  Discharge Condition: Stable CODE STATUS: Full code Diet recommendation: Cardiac  Subjective: Seen and examined.  No complaints, no chest pain shortness of breath or any other complaint.   Brief/Interim Summary: Debra Pacione McIntyreis a 63 y.o.femalewith medical history significant ofHLD, GERD, depression with anxiety, diverticulitis, wheelchair bond due to hx ofpolio, obesity, who presented with chest pain on 03/31/2019.  The pain has been ongoing since Thursday..  It was substernal, pressure-like and radiating to the mid back.  No shortness of breath or any other complaint.  Patient was seen by PCP Friday afternoon, and had EKG in office which was abnormal, therefore patient wassento ED for further evaluation treatment.  Upon arrival to ED, pt was found to have troponin 41, 46, WBC 7.1, negative COVID-19 test, electrolytes renal function okay, temperature 99.1, blood pressure 138/74, heart rate 67, oxygen saturation 99% on room air. Chest x-ray negative. CT angiogram is negative for dissection and central PE.Patient was admitted to hospital service and cardiology consulted.  She was started on heparin drip and then due to chest pain and elevated troponin, she was also started on nitro patch which relieved her chest pain.  She then underwent cardiac cath on 04/02/2019 which showed clean coronaries but confirmed severe aortic stenosis.  Valvular team was consulted.  Patient underwent CT  coronary.  Valve team cleared the patient to be discharged and they will follow-up with her as an outpatient and arrange valve replacement.  Discharge Diagnoses:  Principal Problem:   Chest pain Active Problems:   NSTEMI (non-ST elevated myocardial infarction) ([place])   Hyperlipidemia   GERD (gastroesophageal reflux disease)   Depression with anxiety   Unstable angina [facility])   Aortic valve stenosis    Discharge Instructions  Discharge Instructions    Discharge patient   Complete by: As directed    Discharge disposition: 01-Home or Self Care   Discharge patient date: 04/03/2019     Allergies as of 04/03/2019      Reactions   Flagyl [metronidazole] Anaphylaxis   Amoxicillin Swelling   Other Nausea Only   Tylenol with Codeine #3   Percocet [oxycodone-acetaminophen] Nausea Only   Plaquenil [hydroxychloroquine Sulfate] Other (See Comments)   Blurred vision, joint pain   Sulfa Antibiotics Nausea Only      Medication List    TAKE these medications   ALPRAZolam 0.5 MG tablet Commonly known as: XANAX Take 0.5 mg by mouth 2 (two) times daily as needed for sleep.   benzonatate 100 MG capsule Commonly known as: TESSALON Take 1 capsule (100 mg total) by mouth every 8 (eight) hours.   D3 High Potency 25 MCG (1000 UT) capsule Generic drug: Cholecalciferol Take 1,000 Units by mouth daily.   docusate sodium 100 MG capsule Commonly known as: COLACE Take 100 mg by mouth daily.   gabapentin 300 MG capsule Commonly known as: NEURONTIN Take 300 mg by mouth daily.   loratadine 10 MG tablet Commonly known as: CLARITIN Take 1 tablet (10 mg total) by mouth daily.   oxymetazoline 0.05 % nasal spray Commonly  known as: Afrin Nasal Spray Place 1 spray into both nostrils 2 (two) times daily.   pantoprazole 40 MG tablet Commonly known as: PROTONIX Take 40 mg by mouth daily.   sodium chloride 0.65 % Soln nasal spray Commonly known as: OCEAN Place 1 spray into both nostrils as  needed for congestion.   traZODone 100 MG tablet Commonly known as: DESYREL Take 100 mg by mouth at bedtime.   venlafaxine XR 75 MG 24 hr capsule Commonly known as: EFFEXOR-XR Take 75 mg by mouth daily with breakfast.   vitamin B-12 250 MCG tablet Commonly known as: CYANOCOBALAMIN Take 250 mcg by mouth daily.      Follow-up Information    Gaye Pollack, MD. Go on 04/11/2019.   Specialty: Cardiothoracic Surgery Why: @ 2:30pm, please call the office to make sure Dr. Cyndia Bent has not been called to emergency surgery.  Contact information: [address] [place] [address] [phone]        [name], MD Follow up in 1 week(s).   Specialty: Family Medicine Contact information: [place] [address] [phone]        Minus Breeding, MD .   Specialty: Cardiology Contact information: [address] [place] [address] [phone]          Allergies  Allergen Reactions  . Flagyl [Metronidazole] Anaphylaxis  . Amoxicillin Swelling  . Other Nausea Only    Tylenol with Codeine #3  . Percocet [Oxycodone-Acetaminophen] Nausea Only  . Plaquenil [Hydroxychloroquine Sulfate] Other (See Comments)    Blurred vision, joint pain  . Sulfa Antibiotics Nausea Only    Consultations: Cardiology   Procedures/Studies: Dg Chest 2 View  Result Date: 03/30/2019 CLINICAL DATA:  Acute chest pain for 1 day. EXAM: CHEST - 2 VIEW COMPARISON:  09/25/2017 FINDINGS: The cardiomediastinal silhouette is unremarkable. There is no evidence of focal airspace disease, pulmonary edema, suspicious pulmonary nodule/mass, pleural effusion, or pneumothorax. No acute bony abnormalities are identified. IMPRESSION: No active cardiopulmonary disease. Electronically Signed   By: Margarette Canada M.D.   On: 03/30/2019 19:28   Ct Coronary Morph W/cta Cor W/score W/ca W/cm &/or Wo/cm  Addendum Date: 04/03/2019   ADDENDUM REPORT:  04/03/2019 13:01 CLINICAL DATA:  Aortic stenosis EXAM: Cardiac TAVR CT TECHNIQUE: The patient was scanned on a Siemens Force [id number] slice scanner. A 120 kV retrospective scan was triggered in the descending thoracic aorta at 111 HU's. Gantry rotation speed was 270 msecs and collimation was .9 mm. No beta blockade or nitro were given. The 3D data set was reconstructed in 5% intervals of the R-R cycle. Systolic and diastolic phases were analyzed on a dedicated work station using MPR, MIP and VRT modes. The patient received 80 cc of contrast. FINDINGS: Aortic Valve: Bicuspid heavily calcified with restricted leaflet motion Aorta: Mildly aneurysmal with bovine arc and mild calcific atherosclerosis Sinotubular Junction: 28 mm Ascending Thoracic Aorta: 37 mm Aortic Arch: 24 mm Descending Thoracic Aorta: 22 mm Sinus of Valsalva Measurements: Non-coronary: 31.7 mm Right - coronary: 32.2 mm Left - coronary: 32.9 mm Coronary Artery Height above Annulus: Left Main: 14.9 mm above annulus Right Coronary: 14.3 mm above annulus Virtual Basal Annulus Measurements: Maximum/Minimum Diameter: 27.8 mm x 22.8 mm Perimeter: 83 mm Area: 517 mm2 Coronary Arteries: Sufficient height above annulus for deployment Optimum Fluoroscopic Angle for Delivery: LAO 13 Cranial 3 degrees IMPRESSION: 1. Bicuspid AV with annular area of 517 mm2 suitable for a 26 mm  Sapien 3 Ultra valve 2.  Coronary arteries sufficient height above annulus for deployment 3.  Mild aortic root enlargement 3.7 cm with bovine arch 4. Optimum angiographic angle for deployment LAO 13 Cranial 3 degrees [place] Electronically Signed   By: Jenkins Rouge M.D.   On: 04/03/2019 13:01   Result Date: 04/03/2019 EXAM: OVER-READ INTERPRETATION  CT CHEST The following report is an over-read performed by radiologist Dr. Rolm Baptise of [facility], [place] on 04/03/2019. This over-read does not include interpretation of cardiac or coronary anatomy or pathology. The coronary CTA  interpretation by the cardiologist is attached. COMPARISON:  03/31/2019 FINDINGS: Vascular: Heart is normal size. Aorta is normal caliber. No dissection. Mediastinum/Nodes: No adenopathy in the lower mediastinum or hila. Calcified right hilar lymph nodes. Lungs/Pleura: Tree-in-bud nodular densities noted in the peripheral right middle lobe, right lower lobe and left lower lobe, similar to prior study. No effusions. Upper Abdomen: Imaging into the upper abdomen shows no acute findings. Musculoskeletal: Chest wall soft tissues are unremarkable. No acute bony abnormality. IMPRESSION: Clustered tree-in-bud nodular densities in the peripheral right middle lobe, right lower lobe and left lower lobe, likely related to infectious or inflammatory process/small airways disease. No acute extra cardiac abnormality. Electronically Signed: By: Rolm Baptise M.D. On: 04/03/2019 10:04   Ct Angio Chest/abd/pel For Dissection W And/or Wo Contrast  Result Date: 03/31/2019 CLINICAL DATA:  Chest/back pain, acute, aortic dissection suspect EXAM: CT ANGIOGRAPHY CHEST, ABDOMEN AND PELVIS TECHNIQUE: Multidetector CT imaging through the chest, abdomen and pelvis was performed using the standard protocol during bolus administration of intravenous contrast. Multiplanar reconstructed images and MIPs were obtained and reviewed to evaluate the vascular anatomy. CONTRAST:  165mL OMNIPAQUE IOHEXOL 350 MG/ML SOLN COMPARISON:  Chest radiograph yesterday. Abdominopelvic CT 03/31/2016 FINDINGS: CTA CHEST FINDINGS Cardiovascular: Thoracic aorta is normal in caliber. No aortic dissection, acute aortic syndrome or aneurysm. Conventional branching pattern from the aortic arch. Aortic valvular calcifications. No central pulmonary embolus to the lobar level. Heart is normal in size. No pericardial effusion. Mediastinum/Nodes: Calcified right hilar nodes consistent with prior granulomatous disease. No suspicious noncalcified adenopathy. Subcentimeter left  thyroid nodule does not meet size criteria for further evaluation. Patulous esophagus. Small hiatal hernia. Lungs/Pleura: Heterogeneous pulmonary parenchyma. 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 Clustered nodules in the subpleural right middle lobe, series 8, image 104, chronic and unchanged from 2017 abdominal CT. No pulmonary edema. No pleural fluid. The trachea and mainstem bronchi are patent. Musculoskeletal: Mild multilevel degenerative change in the spine. There are no acute or suspicious osseous abnormalities. Degenerative change in the right shoulder. Review of the MIP images confirms the above findings. CTA ABDOMEN AND PELVIS FINDINGS VASCULAR Aorta: Normal caliber aorta without aneurysm, dissection, vasculitis or significant stenosis. Mild atherosclerosis. Celiac: Patent without evidence of aneurysm, dissection, vasculitis or significant stenosis. SMA: Patent without evidence of aneurysm, dissection, vasculitis or significant stenosis. Renals: Both renal arteries are patent without evidence of aneurysm, dissection, vasculitis, fibromuscular dysplasia or significant stenosis. IMA: Patent without evidence of aneurysm, dissection, vasculitis or significant stenosis. Inflow: Patent without evidence of aneurysm, dissection, vasculitis or significant stenosis. Veins: Limited assessment on this arterial phase exam. Review of the MIP images confirms the above findings. NON-VASCULAR Hepatobiliary: No focal liver abnormality is seen. Status post cholecystectomy. No biliary dilatation. Pancreas: No ductal dilatation or inflammation. Spleen: Normal in size and arterial enhancement. Adrenals/Urinary Tract: No adrenal nodule. Suggestion of slight left renal atrophy. No hydronephrosis.  Tiny cortical hypodensities in the left kidney are too small to characterize. Urinary bladder partially distended. No bladder wall thickening. Stomach/Bowel: Small  hiatal hernia. Stomach is decompressed and not well assessed. No small bowel wall thickening or inflammatory change. Prior appendectomy with surgical clips in the right lower quadrant. Small volume of stool throughout the colon. Colonic diverticulosis from the splenic flexure distally, prominent in the sigmoid. No evidence of diverticulitis. Lymphatic: No abdominopelvic adenopathy. Reproductive: Status post hysterectomy. No adnexal masses. Other: Postsurgical change of the right anterior abdominal wall with mesh. Fat containing umbilical hernia. No free air or free fluid. Musculoskeletal: Right hip arthroplasty. Degenerative change in the spine with multilevel endplate changes. There are no acute or suspicious osseous abnormalities. Review of the MIP images confirms the above findings. IMPRESSION: 1. No aortic dissection or acute aortic abnormality. Mild thoracoabdominal aortic atherosclerosis. Aortic valvular calcifications, consider further evaluation with echocardiogram. 2. No acute abnormality in the chest, abdomen, or pelvis. 3. Incidental colonic diverticulosis without diverticulitis. Small hiatal hernia. 4. Right abdominal wall hernia repair with mesh. Fat containing umbilical hernia. Aortic Atherosclerosis (ICD10-I70.0). Electronically Signed   By: Keith Rake M.D.   On: 03/31/2019 02:42   Vas US Carotid  Result Date: 04/03/2019 Carotid Arterial Duplex Study Risk Factors:      Hyperlipidemia. Other Factors:     Aortic valve disease. Pre Operative TAVR. History of Polio. Limitations        Today's exam was limited due to the body habitus of the                    patient. Comparison Study:  No prior study on file for comparison Performing Technologist: Sharion Dove RVS  Examination Guidelines: A complete evaluation includes B-mode imaging, spectral Doppler, color Doppler, and power Doppler as needed of all accessible portions of each vessel. Bilateral testing is considered an integral part of a  complete examination. Limited examinations for reoccurring indications may be performed as noted.  Right Carotid Findings: +----------+--------+--------+--------+------------------+------------------+           PSV cm/sEDV cm/sStenosisPlaque DescriptionComments           +----------+--------+--------+--------+------------------+------------------+ CCA Prox  78      17                                intimal thickening +----------+--------+--------+--------+------------------+------------------+ CCA Distal85      21                                intimal thickening +----------+--------+--------+--------+------------------+------------------+ ICA Prox  51      15                                                   +----------+--------+--------+--------+------------------+------------------+ ICA Distal80      23                                tortuous           +----------+--------+--------+--------+------------------+------------------+ ECA       72      12                                                   +----------+--------+--------+--------+------------------+------------------+ +----------+--------+-------+--------+-------------------+  PSV cm/sEDV cmsDescribeArm Pressure (mmHG) +----------+--------+-------+--------+-------------------+ [id number]                                        +----------+--------+-------+--------+-------------------+ +---------+--------+--+--------+-+ VertebralPSV cm/s43EDV cm/s9 +---------+--------+--+--------+-+  Left Carotid Findings: +----------+--------+--------+--------+------------------+------------------+           PSV cm/sEDV cm/sStenosisPlaque DescriptionComments           +----------+--------+--------+--------+------------------+------------------+ CCA Prox  69      23                                intimal thickening  +----------+--------+--------+--------+------------------+------------------+ CCA Distal74      23                                intimal thickening +----------+--------+--------+--------+------------------+------------------+ ICA Prox  80      24                                                   +----------+--------+--------+--------+------------------+------------------+ ICA Distal66      18                                tortuous           +----------+--------+--------+--------+------------------+------------------+ ECA       96      11                                                   +----------+--------+--------+--------+------------------+------------------+ +----------+--------+--------+--------+-------------------+           PSV cm/sEDV cm/sDescribeArm Pressure (mmHG) +----------+--------+--------+--------+-------------------+ [id number]                                         +----------+--------+--------+--------+-------------------+ +---------+--------+--+--------+--+ VertebralPSV cm/s71EDV cm/s31 +---------+--------+--+--------+--+  Summary: Right Carotid: The extracranial vessels were near-normal with only minimal wall                thickening or plaque. Left Carotid: The extracranial vessels were near-normal with only minimal wall               thickening or plaque. Vertebrals:  Bilateral vertebral arteries demonstrate antegrade flow. Subclavians: Normal flow hemodynamics were seen in bilateral subclavian              arteries. *See table(s) above for measurements and observations.  Electronically signed by Ruta Hinds MD on 04/03/2019 at 11:16:57 AM.    Final       Discharge Exam: Vitals:   04/03/19 0452 04/03/19 1137  BP: (!) 117/47 112/61  Pulse: 82 65  Resp: 17 18  Temp: 98.5 F (36.9 C) 97.7 F (36.5 C)  SpO2: 93% 99%   Vitals:   04/03/19 0030 04/03/19 0446 04/03/19 0452 04/03/19 1137  BP: (!) 98/58  (!) 117/47 112/61  Pulse:  79  82 65  Resp: 18  17 18  Temp: 98.8 F (37.1 C)  98.5 F (36.9 C) 97.7 F (36.5 C)  TempSrc: Oral  Oral Oral  SpO2: 92%  93% 99%  Weight:  120.3 kg    Height:        General: Pt is alert, awake, not in acute distress, obese Cardiovascular: RRR, S1/S2 +, no rubs, no gallops, 4/6 systolic murmur Respiratory: CTA bilaterally, no wheezing, no rhonchi Abdominal: Soft, NT, ND, bowel sounds + Extremities: no edema, no cyanosis, left lower extremity weakness at baseline due to polio.    The results of significant diagnostics from this hospitalization (including imaging, microbiology, ancillary and laboratory) are listed below for reference.     Microbiology: Recent Results (from the past 240 hour(s))  SARS CORONAVIRUS 2 (TAT 6-24 HRS) Nasopharyngeal Nasopharyngeal Swab     Status: None   Collection Time: 03/31/19  4:35 AM   Specimen: Nasopharyngeal Swab  Result Value Ref Range Status   SARS Coronavirus 2 NEGATIVE NEGATIVE Final    Comment: (NOTE) SARS-CoV-2 target nucleic acids are NOT DETECTED. The SARS-CoV-2 RNA is generally detectable in upper and lower respiratory specimens during the acute phase of infection. Negative results do not preclude SARS-CoV-2 infection, do not rule out co-infections with other pathogens, and should not be used as the sole basis for treatment or other patient management decisions. Negative results must be combined with clinical observations, patient history, and epidemiological information. The expected result is Negative. Fact Sheet for Patients: SugarRoll.be Fact Sheet for Healthcare Providers: https://www.woods-mathews.com/ This test is not yet approved or cleared by the [place] FDA and  has been authorized for detection and/or diagnosis of SARS-CoV-2 by FDA under an Emergency Use Authorization (EUA). This EUA will remain  in effect (meaning this test can be used) for the duration of  the COVID-19 declaration under Section 56 4(b)(1) of the Act, 21 U.S.C. section 360bbb-3(b)(1), unless the authorization is terminated or revoked sooner. Performed at [facility], [place] [address] [place], [place] [address]      Labs: BNP (last 3 results) No results for input(s): BNP in the last 8760 hours. Basic Metabolic Panel: Recent Labs  Lab 03/30/19 1858 04/02/19 0625 04/02/19 0831 04/03/19 0449  NA 133* 140 142  142 140  K 4.1 3.7 3.9  3.8 3.7  CL 98 108  --  104  CO2 25 21*  --  25  GLUCOSE 103* 110*  --  122*  BUN 13 9  --  8  CREATININE 0.64 0.63  --  0.53  CALCIUM 9.1 8.3*  --  9.0   Liver Function Tests: No results for input(s): AST, ALT, ALKPHOS, BILITOT, PROT, ALBUMIN in the last 168 hours. No results for input(s): LIPASE, AMYLASE in the last 168 hours. No results for input(s): AMMONIA in the last 168 hours. CBC: Recent Labs  Lab 03/30/19 1858 04/01/19 0601 04/02/19 0625 04/02/19 0831 04/03/19 0449  WBC 7.7 11.3* 6.9  --  8.0  HGB 14.8 14.0 13.5 13.3  13.3 13.5  HCT 44.5 41.1 41.7 39.0  39.0 40.9  MCV 94.9 93.0 97.0  --  94.5  PLT 296 273 228  --  279   Cardiac Enzymes: No results for input(s): CKTOTAL, CKMB, CKMBINDEX, TROPONINI in the last 168 hours. BNP: Invalid input(s): POCBNP CBG: No results for input(s): GLUCAP in the last 168 hours. D-Dimer No results for input(s): DDIMER in the last 72 hours. Hgb A1c No results for input(s): HGBA1C in the last 72 hours. Lipid Profile  No results for input(s): CHOL, HDL, LDLCALC, TRIG, CHOLHDL, LDLDIRECT in the last 72 hours. Thyroid function studies No results for input(s): TSH, T4TOTAL, T3FREE, THYROIDAB in the last 72 hours.  Invalid input(s): FREET3 Anemia work up No results for input(s): VITAMINB12, FOLATE, FERRITIN, TIBC, IRON, RETICCTPCT in the last 72 hours. Urinalysis    Component Value Date/Time   COLORURINE YELLOW 03/30/2016 2015   APPEARANCEUR CLEAR 03/30/2016 2015    LABSPEC 1.013 03/30/2016 2015   PHURINE 5.5 03/30/2016 2015   GLUCOSEU NEGATIVE 03/30/2016 2015   HGBUR NEGATIVE 03/30/2016 2015   BILIRUBINUR NEGATIVE 03/30/2016 2015   KETONESUR NEGATIVE 03/30/2016 2015   PROTEINUR NEGATIVE 03/30/2016 2015   UROBILINOGEN 0.2 06/03/2010 0623   NITRITE NEGATIVE 03/30/2016 2015   LEUKOCYTESUR NEGATIVE 03/30/2016 2015   Sepsis Labs Invalid input(s): PROCALCITONIN,  WBC,  LACTICIDVEN Microbiology Recent Results (from the past 240 hour(s))  SARS CORONAVIRUS 2 (TAT 6-24 HRS) Nasopharyngeal Nasopharyngeal Swab     Status: None   Collection Time: 03/31/19  4:35 AM   Specimen: Nasopharyngeal Swab  Result Value Ref Range Status   SARS Coronavirus 2 NEGATIVE NEGATIVE Final    Comment: (NOTE) SARS-CoV-2 target nucleic acids are NOT DETECTED. The SARS-CoV-2 RNA is generally detectable in upper and lower respiratory specimens during the acute phase of infection. Negative results do not preclude SARS-CoV-2 infection, do not rule out co-infections with other pathogens, and should not be used as the sole basis for treatment or other patient management decisions. Negative results must be combined with clinical observations, patient history, and epidemiological information. The expected result is Negative. Fact Sheet for Patients: SugarRoll.be Fact Sheet for Healthcare Providers: https://www.woods-mathews.com/ This test is not yet approved or cleared by the [place] FDA and  has been authorized for detection and/or diagnosis of SARS-CoV-2 by FDA under an Emergency Use Authorization (EUA). This EUA will remain  in effect (meaning this test can be used) for the duration of the COVID-19 declaration under Section 56 4(b)(1) of the Act, 21 U.S.C. section 360bbb-3(b)(1), unless the authorization is terminated or revoked sooner. Performed at [facility], [place] [address], [place], [place] [address]      Time  coordinating discharge: Over 30 minutes  SIGNED:   Darliss Cheney, MD  [facility] 04/03/2019, 1:29 PM  If 7PM-7AM, please contact night-coverage [email or web address] Password [email or web address]

## 2019-04-06 ENCOUNTER — Emergency Department (HOSPITAL_BASED_OUTPATIENT_CLINIC_OR_DEPARTMENT_OTHER)
Admission: EM | Admit: 2019-04-06 | Discharge: 2019-04-06 | Disposition: A | Discharge status: Home / Self Care | Payer: Medicare Other | Attending: Emergency Medicine | Admitting: Emergency Medicine

## 2019-04-06 ENCOUNTER — Other Ambulatory Visit: Payer: Self-pay

## 2019-04-06 ENCOUNTER — Emergency Department (HOSPITAL_BASED_OUTPATIENT_CLINIC_OR_DEPARTMENT_OTHER): Payer: Medicare Other

## 2019-04-06 ENCOUNTER — Encounter (HOSPITAL_BASED_OUTPATIENT_CLINIC_OR_DEPARTMENT_OTHER): Payer: Self-pay

## 2019-04-06 DIAGNOSIS — Z881 Allergy status to other antibiotic agents status: Secondary | ICD-10-CM | POA: Insufficient documentation

## 2019-04-06 DIAGNOSIS — Z888 Allergy status to other drugs, medicaments and biological substances status: Secondary | ICD-10-CM | POA: Diagnosis not present

## 2019-04-06 DIAGNOSIS — Z885 Allergy status to narcotic agent status: Secondary | ICD-10-CM | POA: Insufficient documentation

## 2019-04-06 DIAGNOSIS — Z79899 Other long term (current) drug therapy: Secondary | ICD-10-CM | POA: Insufficient documentation

## 2019-04-06 DIAGNOSIS — E785 Hyperlipidemia, unspecified: Secondary | ICD-10-CM | POA: Insufficient documentation

## 2019-04-06 DIAGNOSIS — Z880 Allergy status to penicillin: Secondary | ICD-10-CM | POA: Insufficient documentation

## 2019-04-06 DIAGNOSIS — I808 Phlebitis and thrombophlebitis of other sites: Secondary | ICD-10-CM | POA: Diagnosis not present

## 2019-04-06 DIAGNOSIS — R2232 Localized swelling, mass and lump, left upper limb: Secondary | ICD-10-CM | POA: Diagnosis present

## 2019-04-06 DIAGNOSIS — Z882 Allergy status to sulfonamides status: Secondary | ICD-10-CM | POA: Diagnosis not present

## 2019-04-06 DIAGNOSIS — I252 Old myocardial infarction: Secondary | ICD-10-CM | POA: Insufficient documentation

## 2019-04-06 DIAGNOSIS — M79602 Pain in left arm: Secondary | ICD-10-CM | POA: Diagnosis not present

## 2019-04-06 NOTE — ED Provider Notes (Addendum)
Medical screening examination/treatment/procedure(s) were conducted as a shared visit with non-physician practitioner(s) and myself.  I personally evaluated the patient during the encounter.    Patient seen by me along with physician assistant.  Patient with complaint of left forearm swelling that really started after midnight with pain swelling.  No redness no increased warmth.  Patient discharged from the hospital on Tuesday after several day stay for work-up of severe aortic valve abnormality.  Patient had an IV in the left arm but received all the dye studies through the right arm and no problem with the right arm.  Patient without any chest pain or shortness of breath.  Oxygen saturation here is 99% on room air.  Not tachycardic.  Not febrile.  Radial pulses good good movement of the fingers.  Cap refill less than 2 seconds.  Radial pulse is actually 2+.  Sensation intact.  There is swelling of the forearm and may be the arm itself.  A lot of the swelling is can on the distal one third.  There is no swelling to the hand or fingers.  Patient had been referred in to have an ultrasound to rule out blood clot in the arm.  Clinically does not appear to be a deep vein thrombosis because there is no swelling of the hand.  But a superficial vein is possible.  Patient will get left upper extremity Doppler studies.  Disposition will be based on those results.   Fredia Sorrow, MD 04/06/19 1517    Fredia Sorrow, MD 04/06/19 [phone]

## 2019-04-06 NOTE — ED Triage Notes (Addendum)
Pt c/o swelling and pain to left UE x 2 days-states she had recent admn with multiple IV sticks-states she was seena t [facility] and sent to r/o blood clot in arm-NAD-to triage in w/c

## 2019-04-06 NOTE — Discharge Instructions (Signed)
Please read and follow all provided instructions.  Your diagnoses today include:  1. Superficial thrombophlebitis of left upper extremity     Tests performed today include:  Ultrasound of your left arm which shows 2 veins that are superficial that are inflamed and clotted, it does not show any blood clots in the deep veins  Vital signs. See below for your results today.   Medications prescribed:   Ibuprofen (Motrin, Advil) - anti-inflammatory pain medication  Do not exceed 600mg  ibuprofen every 6 hours, take with food  You have been prescribed an anti-inflammatory medication or NSAID. Take with food. Take smallest effective dose for the shortest duration needed for your pain. Stop taking if you experience stomach pain or vomiting.   Take any prescribed medications only as directed.  Home care instructions:  Follow any educational materials contained in this packet.  BE VERY CAREFUL not to take multiple medicines containing Tylenol (also called acetaminophen). Doing so can lead to an overdose which can damage your liver and cause liver failure and possibly death.   Follow-up instructions: Please follow-up with your primary care provider in the next 3 days for further evaluation of your symptoms and let your doctors know about this prior to your upcoming procedure.  Return instructions:   Please return to the Emergency Department if you experience worsening symptoms.   Return if you have increasing swelling in your forearm, or associated redness or fevers.  Return with chest pain or trouble breathing.  Please return if you have any other emergent concerns.  Additional Information:  Your vital signs today were: BP 108/72 (BP Location: Right Arm)    Pulse 71    Temp 98.7 F (37.1 C) (Oral)    Resp 16    Ht 5\' 8"  (1.727 m)    Wt 120.2 kg    SpO2 99%    BMI 40.29 kg/m  If your blood pressure (BP) was elevated above 135/85 this visit, please have this repeated by your doctor  within one month. --------------

## 2019-04-06 NOTE — ED Notes (Signed)
ED Provider at bedside. 

## 2019-04-06 NOTE — ED Provider Notes (Signed)
[facility] EMERGENCY DEPARTMENT Provider Note   CSN: [id number] Arrival date & time: 04/06/19  1430     History   Chief Complaint Chief Complaint  Patient presents with   Edema    HPI [name] is a 63 y.o. female.     Patient presents with complaint of left arm pain and swelling.  Patient was recently hospitalized for chest pain and had a cardiac catheterization.  She was found to have severe aortic stenosis and is scheduled for a TAVR next month.  Patient had multiple IVs in her upper extremities.  Catheterization was done through right groin.  Patient states that as soon as the IVs were removed she developed some pain in her left forearm.  It has become more pronounced over the past 2 to 3 days.  She has had some associated swelling.  She states that her arm feels weak and she is unable to do ADLs with her left arm because of pain and weakness.  No overlying erythema or redness.  No chest pain or shortness of breath.  No fevers, chills, fatigue.  Onset of symptoms acute.  No treatments prior to arrival.  She was referred to the emergency department by her PCP to rule out DVT.     Past Medical History:  Diagnosis Date   Anxiety    Arthritis    low back   Cervical pain (neck)    Connective tissue disease ([place])    questionable Lupus (she has yet to see a rheumatologist)   Depression    Diverticulitis    GERD (gastroesophageal reflux disease)    History of fracture of left hip    Hyperlipidemia    Insomnia    Morbid obesity (HCC)    OSA on CPAP    Peripheral edema    Post-polio syndrome    With left leg paralysis   Post-viral reactive airway disease    Severe aortic stenosis    Sigmoid diverticulosis     Patient Active Problem List   Diagnosis Date Noted   Unstable angina ([place])    Aortic valve stenosis    Chest pain 03/31/2019   NSTEMI (non-ST elevated myocardial infarction) ([place]) 03/31/2019   Hyperlipidemia    GERD  (gastroesophageal reflux disease)    Depression with anxiety    Elevated troponin    Edema 11/09/2013   Obesity (BMI 35.0-39.9 without comorbidity) 04/12/2013    Past Surgical History:  Procedure Laterality Date   ANKLE FUSION     left   APPENDECTOMY     CHOLECYSTECTOMY     KNEE ARTHROPLASTY     LEG SURGERY     muscle surgery related to polio   RIGHT/LEFT HEART CATH AND CORONARY ANGIOGRAPHY N/A 04/02/2019   Procedure: RIGHT/LEFT HEART CATH AND CORONARY ANGIOGRAPHY;  Surgeon: Nelva Bush, MD;  Location: [facility];  Service: Cardiovascular;  Laterality: N/A;   TOTAL HIP ARTHROPLASTY     right x 3   UMBILICAL HERNIA REPAIR       OB History   No obstetric history on file.      Home Medications    Prior to Admission medications   Medication Sig Start Date End Date Taking? Authorizing Provider  ALPRAZolam Duanne Moron) 0.5 MG tablet Take 0.5 mg by mouth 2 (two) times daily as needed for sleep.     [provider]  benzonatate (TESSALON) 100 MG capsule Take 1 capsule (100 mg total) by mouth every 8 (eight) hours. Patient not  taking: Reported on 03/31/2019 09/25/17   Horton, Barbette Hair, MD  Cholecalciferol (D3 HIGH POTENCY) 1000 UNITS capsule Take 1,000 Units by mouth daily.    [provider]  docusate sodium (COLACE) 100 MG capsule Take 100 mg by mouth daily.    [provider]  gabapentin (NEURONTIN) 300 MG capsule Take 300 mg by mouth daily.    [provider]  loratadine (CLARITIN) 10 MG tablet Take 1 tablet (10 mg total) by mouth daily. Patient not taking: Reported on 03/31/2019 09/25/17   Horton, Barbette Hair, MD  oxymetazoline (AFRIN NASAL SPRAY) 0.05 % nasal spray Place 1 spray into both nostrils 2 (two) times daily. Patient not taking: Reported on 03/31/2019 09/25/17   Horton, Barbette Hair, MD  pantoprazole (PROTONIX) 40 MG tablet Take 40 mg by mouth daily.    [provider]  sodium chloride (OCEAN) 0.65 % SOLN  nasal spray Place 1 spray into both nostrils as needed for congestion. Patient not taking: Reported on 03/31/2019 09/25/17   Horton, Barbette Hair, MD  traZODone (DESYREL) 100 MG tablet Take 100 mg by mouth at bedtime.     [provider]  venlafaxine XR (EFFEXOR-XR) 75 MG 24 hr capsule Take 75 mg by mouth daily with breakfast.    [provider]  vitamin B-12 (CYANOCOBALAMIN) 250 MCG tablet Take 250 mcg by mouth daily.    [provider]    Family History Family History  Problem Relation Age of Onset   Cancer Mother    Heart disease Father    CAD Other        FAM Hx OF CAD   Diabetes Other        FAM Hx of DM   Hypertension Other     Social History Social History   Tobacco Use   Smoking status: Never Smoker   Smokeless tobacco: Never Used  Substance Use Topics   Alcohol use: No   Drug use: Never     Allergies   Flagyl [metronidazole], Amoxicillin, Other, Percocet [oxycodone-acetaminophen], Plaquenil [hydroxychloroquine sulfate], and Sulfa antibiotics   Review of Systems Review of Systems  Constitutional: Negative for fever.  HENT: Negative for rhinorrhea and sore throat.   Eyes: Negative for redness.  Respiratory: Negative for shortness of breath.   Cardiovascular: Negative for chest pain.  Gastrointestinal: Negative for abdominal pain, diarrhea, nausea and vomiting.  Genitourinary: Negative for dysuria.  Musculoskeletal: Positive for myalgias.  Skin: Negative for rash.  Neurological: Negative for headaches.     Physical Exam Updated Vital Signs BP 108/72 (BP Location: Right Arm)    Pulse 71    Temp 98.7 F (37.1 C) (Oral)    Resp 16    Ht 5\' 8"  (1.727 m)    Wt 120.2 kg    SpO2 99%    BMI 40.29 kg/m   Physical Exam Vitals signs and nursing note reviewed.  Constitutional:      Appearance: She is well-developed.  HENT:     Head: Normocephalic and atraumatic.  Eyes:     General:        Right eye: No discharge.        Left  eye: No discharge.     Conjunctiva/sclera: Conjunctivae normal.  Neck:     Musculoskeletal: Normal range of motion and neck supple.  Cardiovascular:     Rate and Rhythm: Normal rate and regular rhythm.     Heart sounds: Murmur present.     Comments: 3 out of 6 holosystolic  murmur heard bilateral upper sternal border Pulmonary:     Effort: Pulmonary effort is normal.     Breath sounds: Normal breath sounds.  Abdominal:     Palpations: Abdomen is soft.     Tenderness: There is no abdominal tenderness.  Musculoskeletal:     Left shoulder: She exhibits normal range of motion and no tenderness.     Left elbow: Normal.     Left wrist: Normal.     Left upper arm: She exhibits no tenderness, no bony tenderness, no swelling and no edema.     Left forearm: She exhibits tenderness, swelling and edema.       Arms:  Skin:    General: Skin is warm and dry.  Neurological:     Mental Status: She is alert.      ED Treatments / Results  Labs (all labs ordered are listed, but only abnormal results are displayed) Labs Reviewed - No data to display  EKG None  Radiology US Venous Img Upper Left (dvt Study)  Result Date: 04/06/2019 CLINICAL DATA:  Left upper extremity pain and edema for the past 2 days. Evaluate for DVT EXAM: LEFT UPPER EXTREMITY VENOUS DOPPLER ULTRASOUND TECHNIQUE: Gray-scale sonography with graded compression, as well as color Doppler and duplex ultrasound were performed to evaluate the upper extremity deep venous system from the level of the subclavian vein and including the jugular, axillary, basilic, radial, ulnar and upper cephalic vein. Spectral Doppler was utilized to evaluate flow at rest and with distal augmentation maneuvers. COMPARISON:  None. FINDINGS: Contralateral Subclavian Vein: Respiratory phasicity is normal and symmetric with the symptomatic side. No evidence of thrombus. Normal compressibility. Internal Jugular Vein: No evidence of thrombus. Normal  compressibility, respiratory phasicity and response to augmentation. Subclavian Vein: No evidence of thrombus. Normal compressibility, respiratory phasicity and response to augmentation. Axillary Vein: No evidence of thrombus. Normal compressibility, respiratory phasicity and response to augmentation. Cephalic Vein: While a cephalic vein appears patent proximally (image 42), there is hypoechoic occlusive thrombus within the cephalic vein at the level of the forearm (image 41). Basilic Vein: While the basilic vein appears patent proximally (images 33 through 35), there is hypoechoic occlusive thrombus within the basilic vein at the level the forearm (image 36 through 40). Brachial Veins: No evidence of thrombus. Normal compressibility, respiratory phasicity and response to augmentation. Radial Veins: No evidence of thrombus. Normal compressibility, respiratory phasicity and response to augmentation. Ulnar Veins: No evidence of thrombus. Normal compressibility, respiratory phasicity and response to augmentation. Venous Reflux:  None visualized. Other Findings:  None visualized. IMPRESSION: 1. No evidence of DVT within the left upper extremity. 2. Examination is positive for occlusive superficial thrombophlebitis involving the cephalic and basilic veins at the level of the forearm. There is no extension of this distal occlusive SVT to the more proximal superficial venous system or to the deep venous system of the left upper extremity. Electronically Signed   By: Sandi Mariscal M.D.   On: 04/06/2019 16:27    Procedures Procedures (including critical care time)  Medications Ordered in ED Medications - No data to display   Initial Impression / Assessment and Plan / ED Course  I have reviewed the triage vital signs and the nursing notes.  Pertinent labs & imaging results that were available during my care of the patient were reviewed by me and considered in my medical decision making (see chart for details).          Patient seen and examined.  Patient  discussed with and seen by Dr. Rogene Houston.  Ultrasound ordered to rule out DVT.  More likely this is phlebitis or some myositis from recent venipuncture.  There does not appear to be any signs of compartment syndrome as distal extremity is neurovascularly intact.  No signs of infection without any redness or erythema, warmth.  If ultrasound is okay, plan pain control, warmth, elevation.  Vital signs reviewed and are as follows: BP 108/72 (BP Location: Right Arm)    Pulse 71    Temp 98.7 F (37.1 C) (Oral)    Resp 16    Ht 5\' 8"  (1.727 m)    Wt 120.2 kg    SpO2 99%    BMI 40.29 kg/m   4:55 PM ultrasound demonstrates 2 areas of thrombophlebitis of the left upper extremity in the location of the patient's pain and swelling.  This is superficial.  Patient will use elevation, heat on the area.  Discussed use of ibuprofen.  I would avoid aspirin unless this is cleared by her specialists prior to her upcoming heart valve procedure.    Final Clinical Impressions(s) / ED Diagnoses   Final diagnoses:  Superficial thrombophlebitis of left upper extremity   Patient with left forearm swelling and pain after multiple recent phlebotomies during her hospitalization.  Ultrasound ordered to evaluate for DVT, such as superficial thrombophlebitis consistent with patient's symptoms.  ED Discharge Orders    None       Carlisle Cater, Hershal Coria 04/06/19 1657    Fredia Sorrow, MD 04/16/19 1945

## 2019-04-06 NOTE — ED Notes (Signed)
Patient transported to Ultrasound 

## 2019-04-11 ENCOUNTER — Other Ambulatory Visit: Payer: Self-pay

## 2019-04-11 ENCOUNTER — Institutional Professional Consult (permissible substitution): Payer: Medicare Other | Admitting: Surgery

## 2019-04-11 VITALS — BP 109/57 | HR 68 | Temp 97.8°F | Resp 20 | Ht 68.0 in | Wt 262.0 lb

## 2019-04-11 DIAGNOSIS — I350 Nonrheumatic aortic (valve) stenosis: Secondary | ICD-10-CM

## 2019-04-12 ENCOUNTER — Encounter: Payer: Self-pay | Admitting: Surgery

## 2019-04-12 NOTE — Progress Notes (Signed)
Patient ID: [name], female   DOB: [birth year], 63 y.o.   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  She had an echocardiogram in July 2015 which showed mild to moderate aortic stenosis with a mean gradient of 24 mmHg and a left ventricular ejection fraction of 55 to 60%.  She reports developing exertional fatigue during the summer 2019.  She noted onset of some shortness of breath this past spring that seem to be worse when she was laying flat.  She has had lower extremity edema for several years that has been worse in the left leg.  She reports some intermittent chest pain over the past several months which she did not think much about.  Then recently she had an episode of severe knifelike central chest pain when she was trying to reposition herself while lying in bed.  There was some radiation to her jaw.  She said that she almost called 911 but it improved after about 5 minutes and resolved.  She was seen by her PCP the following day and had an abnormal electrocardiogram and was sent to the [facility], ER.  In the ER she had an EKG showing lateral and anterolateral ST-T wave abnormalities that were more prominent anterolaterally compared to her previous tracings. High-sensitivity troponin was 41---> 46--> 50--> 51 --> 49.  A CTA of the chest, abdomen, and pelvis showed no acute abnormality.  She had a repeat 2D echocardiogram that showed severe aortic stenosis  with a mean gradient of 71.8 mmHg with a peak gradient of 105 mmHg.  Dimensionless index was 0.14.  Cardiac catheterization was performed on 04/02/2019 and showed normal coronary arteries with severe aortic stenosis.  The mean gradient was measured at 65 mmHg.  There was moderate to severely elevated left ventricular filling pressure and mildly elevated right sided heart pressures.  She improved with blood pressure control and diuresis and was discharged after evaluation by Dr. Burt Knack for consideration of TAVR.  The patient has been wheelchair-bound since [id number] due to complications of polio.  She has paralysis of her left leg.  She subsequently underwent 3 right hip replacements over the years.  Despite this she has remained active and totally independent living alone at home.  She drives a car.  She is able to transfer herself from her wheelchair independently.  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  She has noted that over the past year she has had to cut back on her activities due to fatigue and tiredness.  Past Medical History:  Diagnosis Date   Anxiety    Arthritis    low back   Cervical pain (neck)    Connective tissue disease ([place])    questionable Lupus (she has yet to see a rheumatologist)   Depression    Diverticulitis  GERD (gastroesophageal reflux disease)    History of fracture of left hip    Hyperlipidemia    Insomnia    Morbid obesity (HCC)    OSA on CPAP    Peripheral edema    Post-polio syndrome    With left leg paralysis   Post-viral reactive airway disease    Severe aortic stenosis    Sigmoid diverticulosis     Past Surgical History:  Procedure Laterality Date   ANKLE FUSION     left   APPENDECTOMY     CHOLECYSTECTOMY     KNEE ARTHROPLASTY     LEG SURGERY      muscle surgery related to polio   RIGHT/LEFT HEART CATH AND CORONARY ANGIOGRAPHY N/A 04/02/2019   Procedure: RIGHT/LEFT HEART CATH AND CORONARY ANGIOGRAPHY;  Surgeon: Nelva Bush, MD;  Location: [facility];  Service: Cardiovascular;  Laterality: N/A;   TOTAL HIP ARTHROPLASTY     right x 3   UMBILICAL HERNIA REPAIR      Family History  Problem Relation Age of Onset   Cancer Mother    Heart disease Father    CAD Other        FAM Hx OF CAD   Diabetes Other        FAM Hx of DM   Hypertension Other     Social History   Socioeconomic History   Marital status: Single    Spouse name: Not on file   Number of children: Not on file   Years of education: Not on file   Highest education level: Not on file  Occupational History   Not on file  Social Needs   Financial resource strain: Not on file   Food insecurity    Worry: Not on file    Inability: Not on file   Transportation needs    Medical: Not on file    Non-medical: Not on file  Tobacco Use   Smoking status: Never Smoker   Smokeless tobacco: Never Used  Substance and Sexual Activity   Alcohol use: No   Drug use: Never   Sexual activity: Not on file  Lifestyle   Physical activity    Days per week: Not on file    Minutes per session: Not on file   Stress: Not on file  Relationships   Social connections    Talks on phone: Not on file    Gets together: Not on file    Attends religious service: Not on file    Active member of club or organization: Not on file    Attends meetings of clubs or organizations: Not on file    Relationship status: Not on file   Intimate partner violence    Fear of current or ex partner: Not on file    Emotionally abused: Not on file    Physically abused: Not on file    Forced sexual activity: Not on file  Other Topics Concern   Not on file  Social History Narrative   Lives alone.    Current Outpatient Medications  Medication Sig Dispense Refill     ALPRAZolam (XANAX) 0.5 MG tablet Take 0.5 mg by mouth 2 (two) times daily as needed for sleep.      Cholecalciferol (D3 HIGH POTENCY) 1000 UNITS capsule Take 1,000 Units by mouth daily.     docusate sodium (COLACE) 100 MG capsule Take 100 mg by mouth daily.     gabapentin (NEURONTIN) 300 MG capsule Take 300 mg by  mouth daily.     pantoprazole (PROTONIX) 40 MG tablet Take 40 mg by mouth daily.     traZODone (DESYREL) 100 MG tablet Take 100 mg by mouth at bedtime.      venlafaxine XR (EFFEXOR-XR) 75 MG 24 hr capsule Take 75 mg by mouth daily with breakfast.     vitamin B-12 (CYANOCOBALAMIN) 250 MCG tablet Take 250 mcg by mouth daily.     No current facility-administered medications for this visit.     Allergies  Allergen Reactions   Flagyl [Metronidazole] Anaphylaxis   Amoxicillin Swelling   Other Nausea Only    Tylenol with Codeine #3   Percocet [Oxycodone-Acetaminophen] Nausea Only   Plaquenil [Hydroxychloroquine Sulfate] Other (See Comments)    Blurred vision, joint pain   Sulfa Antibiotics Nausea Only      Review of Systems:   General:  normal appetite, + decreased energy, no weight gain, no weight loss, no fever  Cardiac:  no chest pain with exertion, + chest pain at rest, +SOB with moderate exertion, no resting SOB, no PND, no orthopnea, no palpitations, no arrhythmia, no atrial fibrillation, + LE edema, + dizzy spells, no syncope  Respiratory:  + mild exertional shortness of breath, no home oxygen, no productive cough, no dry cough, no bronchitis, no wheezing, no hemoptysis, no asthma, no pain with inspiration or cough, + sleep apnea, + CPAP at night  GI:   no difficulty swallowing, no reflux, no frequent heartburn, no hiatal hernia, no abdominal pain, no constipation, no diarrhea, no hematochezia, no hematemesis, no melena  GU:   no dysuria,  no frequency, no urinary tract infection, no hematuria,  no kidney stones, no kidney disease  Vascular:  no pain  suggestive of claudication, no pain in feet, no leg cramps, no varicose veins, no DVT, no non-healing foot ulcer  Neuro:   no stroke, no TIA's, no seizures, no headaches, no temporary blindness one eye,  no slurred speech, no peripheral neuropathy, no chronic pain, +unable to walk no memory/cognitive dysfunction  Musculoskeletal: + arthritis, no joint swelling, no myalgias, non-ambulatory, reduced mobility   Skin:   no rash, no itching, no skin infections, no pressure sores or ulcerations  Psych:   + anxiety, no depression, no nervousness, no unusual recent stress  Eyes:   no blurry vision, + floaters, no recent vision changes, + wears glasses or contacts  ENT:   no hearing loss, no loose or painful teeth, no dentures, last saw dentist recently  Hematologic:  no easy bruising, no abnormal bleeding, no clotting disorder, no frequent epistaxis  Endocrine:  no diabetes, does not check CBG's at home           Physical Exam:   BP (!) 109/57    Pulse 68    Temp 97.8 F (36.6 C) (Skin)    Resp 20    Ht 5\' 8"  (1.727 m)    Wt 262 lb (118.8 kg)    SpO2 95% Comment: RA   BMI 39.84 kg/m   General:  well-appearing  HEENT:  Unremarkable, NCAT, PERLA, EOMI  Neck:   no JVD, no bruits, no adenopathy or thyromegaly  Chest:   clear to auscultation, symmetrical breath sounds, no wheezes, no rhonchi   CV:   RRR, grade III/VI crescendo/decrescendo murmur heard best at RSB,  no diastolic murmur  Abdomen:  soft, non-tender, no masses   Extremities:  warm, well-perfused, pulses palpable in feet, mild LE edema  Rectal/GU  Deferred  Neuro:  Grossly non-focal and symmetrical throughout  Skin:   Clean and dry, no rashes, no breakdown   Diagnostic Tests:  ECHOCARDIOGRAM REPORT       Patient Name:   [name] Date of Exam: 03/31/2019 Medical Rec #:  [id number]         Height:       68.0 in Accession #:    [id number]        Weight:       250.0 lb Date of Birth:  Sep 01, 1955         BSA:           2.25 m Patient Age:    63 years          BP:           109/53 mmHg Patient Gender: F                 HR:           70 bpm. Exam Location:  Inpatient  Procedure: 2D Echo and Intracardiac Opacification Agent  Indications:    Ascending aortic aneurysm 441.9 / I71   History:        Patient has prior history of Echocardiogram examinations, most                 recent 12/27/2013. NSTEMI Signs/Symptoms:Chest Pain. Elevated                 troponin.   Sonographer:    Talmage Coin Referring Phys: [place] Comments: Technically difficult study due to poor echo windows. IMPRESSIONS    1. Limited images.  2. Left ventricular ejection fraction, by visual estimation, is 55%. The left ventricle has normal function. There is mildly increased left ventricular hypertrophy.  3. Definity contrast agent was given IV to delineate the left ventricular endocardial borders.  4. Left ventricular diastolic Doppler parameters are consistent with impaired relaxation pattern of LV diastolic filling.  5. Global right ventricle was not well visualized.The right ventricular size is not well visualized. Right vetricular wall thickness was not assessed.  6. Left atrial size was normal.  7. Right atrial size was normal.  8. Mild mitral annular calcification.  9. The mitral valve is grossly normal. No evidence of mitral valve regurgitation. 10. The tricuspid valve is not well visualized. Tricuspid valve regurgitation is trivial. 11. Aortic valve mean gradient measures 71.8 mmHg. 12. The aortic valve was not well visualized but is calcified with restricted motion. Aortic valve regurgitation is mild by color flow Doppler. Severe aortic valve stenosis is suggested by gradients. Suggest TEE to further assess aortic valve structure. 13. The pulmonic valve was not well visualized. Pulmonic valve regurgitation was not assessed by color flow Doppler. 14. Aortic dilatation noted. 15. There is  mild dilatation of the ascending aorta. 16. The inferior vena cava is normal in size with greater than 50% respiratory variability, suggesting right atrial pressure of 3 mmHg. 17. The interatrial septum was not well visualized. 18. TR signal is inadequate for assessing pulmonary artery systolic pressure.  FINDINGS  Left Ventricle: Left ventricular ejection fraction, by visual estimation, is 55%. The left ventricle has normal function. Definity contrast agent was given IV to delineate the left ventricular endocardial borders. There is mildly increased left  ventricular hypertrophy. Spectral Doppler shows Left ventricular diastolic Doppler parameters are consistent with impaired relaxation pattern of LV diastolic filling.  Right Ventricle: The right ventricular size is not well  visualized. Right vetricular wall thickness was not assessed. Global RV systolic function is was not well visualized.  Left Atrium: Left atrial size was normal in size.  Right Atrium: Right atrial size was normal in size  Pericardium: There is no evidence of pericardial effusion.  Mitral Valve: The mitral valve is grossly normal. Mild mitral annular calcification. No evidence of mitral valve regurgitation.  Tricuspid Valve: The tricuspid valve is not well visualized. Tricuspid valve regurgitation is trivial by color flow Doppler.  Aortic Valve: The aortic valve was not well visualized. Aortic valve regurgitation is mild by color flow Doppler. Severe aortic stenosis is present. Aortic valve mean gradient measures 71.8 mmHg. Aortic valve peak gradient measures 105.0 mmHg.  Pulmonic Valve: The pulmonic valve was not well visualized. Pulmonic valve regurgitation was not assessed by color flow Doppler.  Aorta: Aortic dilatation noted. There is mild dilatation of the ascending aorta.  Venous: The inferior vena cava is normal in size with greater than 50% respiratory variability, suggesting right atrial pressure  of 3 mmHg.  IAS/Shunts: The interatrial septum was not well visualized.       Diastology LV e' lateral:   3.57 cm/s LV E/e' lateral: 16.6 LV e' medial:    4.26 cm/s LV E/e' medial:  13.9    LEFT ATRIUM           Index LA Vol (A4C): 52.3 ml 23.27 ml/m  AORTIC VALVE AV Vmax:           512.40 cm/s AV Vmean:          408.600 cm/s AV VTI:            1.300 m AV Peak Grad:      105.0 mmHg AV Mean Grad:      71.8 mmHg LVOT Vmax:         73.10 cm/s LVOT Vmean:        53.900 cm/s LVOT VTI:          0.186 m LVOT/AV VTI ratio: 0.14  MITRAL VALVE MV Area (PHT): 2.26 cm              SHUNTS MV PHT:        97.15 msec            Systemic VTI: 0.19 m MV Decel Time: 335 msec MV E velocity: 59.10 cm/s  103 cm/s MV A velocity: 101.00 cm/s 70.3 cm/s MV E/A ratio:  0.59        1.5    Rozann Lesches MD Electronically signed by Rozann Lesches MD Signature Date/Time: 03/31/2019/4:02:01 PM    Physicians  Panel Physicians Referring Physician Case Authorizing Physician  End, Harrell Gave, MD (Primary)    Procedures  RIGHT/LEFT HEART CATH AND CORONARY ANGIOGRAPHY  Conclusion  Conclusions: 1. No angiographically significant CAD. 2. Severe aortic stenosis (mean gradient 65 mmHg, calculated valve area 0.5 cm^2). 3. Moderately to severely elevated left ventricular filling pressure. 4. Mildly elevated right and pulmonary artery pressures. 5. Moderately reduced cardiac output.  Recommendations: 1. Medical therapy, including careful blood pressure control and diuresis. 2. Valve team consultation for further workup/management of severe aortic stenosis.  Nelva Bush, MD [place] Pager: [phone]   Recommendations  Antiplatelet/Anticoag No indication for antiplatelet therapy at this time .  Discharge Date Anticipated discharge date to be determined.  Indications  Unstable angina (HCC) [I20.0 (ICD-10-CM)]  Aortic valve stenosis, etiology of cardiac valve  disease unspecified [I35.0 (ICD-10-CM)]  Procedural Details  Technical Details Indication: 63  y.o. year-old woman with history of  aortic stenosis, HLD, GERD, wheelchair bound due to hx of polio, depression, and anxiety, presenting for evaluation of chest pain and apparent severe aortic stenosis by echocardiogram (technically difficult study).  GFR: >60 ml/min  Procedure: The risks, benefits, complications, treatment options, and expected outcomes were discussed with the patient. The patient and/or family concurred with the proposed plan, giving informed consent. The patient was brought to the cath lab after IV hydration was begun and oral premedication was given. The patient was further sedated with Versed and Fentanyl. The right groin was prepped and draped in the usual manner, as the patient did now wish to use radial access due to mobility issues. Ultrasound was used to evaluate the right femoral artery and vein. They were both patent.  An ultrasound image was saved in the permanent record. A micropuncture needle was used to access the right femoral artery and vein under ultrasound guidance. Using the modified Seldinger access technique, a 39F and 48F sheaths were placed in the right femoral artery and vein, respectively.  Right heart catheterization was performed by advancing a 48F balloon-tipped catheter through the right heart chambers into the pulmonary capillary wedge position. Pressure measurements and oxygen saturations were obtained.  Thermodilution measurements were also performed.  Selective coronary angiography was performed using 34F JL4 and JR4 catheters to engage the left and right coronary arteries, respectively.  Left heart catheterization was performed using a 34F JR4 catheter and straight wire to cross the aortic valve.  The JR4 catheter was exchanged for a 39F dual lumen pigtail catheter over an exchange-length J-wire.  Simultaneous LV and aortic pressures were obtained.  Left  ventriculogram was not performed.  At the end of the procedure, the right femoral artery and vein sheaths were secured in place, to be removed with manual compression. There were no immediate complications. The patient was taken to the recovery area in stable condition.  Estimated blood loss <50 mL.   During this procedure medications were administered to achieve and maintain moderate conscious sedation while the patient's heart rate, blood pressure, and oxygen saturation were continuously monitored and I was present face-to-face 100% of this time.  Medications (Filter: Administrations occurring from 04/02/19 0750 to 04/02/19 0913) (important)  Continuous medications are totaled by the amount administered until 04/02/19 0913.  Medication Rate/Dose/Volume Action  Date Time   fentaNYL (SUBLIMAZE) injection (mcg) 50 mcg Given 04/02/19 0805   Total dose as of 04/02/19 0913 25 mcg Given 0821   100 mcg 25 mcg Given 0839   midazolam (VERSED) injection (mg) 1 mg Given 04/02/19 0805   Total dose as of 04/02/19 0913 1 mg Given 0821   3 mg 1 mg Given 0839   lidocaine (PF) (XYLOCAINE) 1 % injection (mL) 15 mL Given 04/02/19 0814   Total dose as of 04/02/19 0913        15 mL        Heparin (Porcine) in NaCl 1000-0.9 UT/500ML-% SOLN (mL) 500 mL Given 04/02/19 0821   Total dose as of 04/02/19 0913 500 mL Given 0822   1,000 mL        hydrALAZINE (APRESOLINE) injection (mg) 10 mg Given 04/02/19 0854   Total dose as of 04/02/19 0913        10 mg        iohexol (OMNIPAQUE) 350 MG/ML injection (mL) 55 mL Given 04/02/19 0903   Total dose as of 04/02/19 0913  55 mL        ondansetron (ZOFRAN) injection (mg) 4 mg Given 04/02/19 0902   Total dose as of 04/02/19 0913        4 mg        venlafaxine XR (EFFEXOR-XR) 24 hr capsule 75 mg (mg) *Not included in total Automatically Held 04/02/19 0800   Total dose as of 04/02/19 0913        Cannot be calculated        Sedation Time  Sedation Time  Physician-1: 50 minutes 37 seconds  Contrast  Medication Name Total Dose  iohexol (OMNIPAQUE) 350 MG/ML injection 55 mL    Radiation/Fluoro  Fluoro time: 9.2 (min) DAP: 39.4 (Gycm2) Cumulative Air Kerma: 123456 (mGy)  Complications  Complications documented before study signed (04/02/2019 [id number] PM)   No complications were associated with this study.  Documented by Nelva Bush, MD - 04/02/2019 1:05 PM    Coronary Findings  Diagnostic Dominance: Right Left Main  Vessel is large. Vessel is angiographically normal.  Left Anterior Descending  Vessel is large. Vessel is angiographically normal.  First Diagonal Branch  Vessel is small in size.  Second Diagonal Branch  Vessel is moderate in size.  Third Diagonal Branch  Vessel is small in size.  Left Circumflex  Vessel is large. Vessel is angiographically normal.  First Obtuse Marginal Branch  Vessel is small in size.  Second Obtuse Marginal Branch  Vessel is moderate in size.  Third Obtuse Marginal Branch  Vessel is small in size.  Right Coronary Artery  Vessel is moderate in size. Vessel is angiographically normal.  Right Posterior Descending Artery  Vessel is small in size.  Intervention  No interventions have been documented. Right Heart  Right Heart Pressures RA (mean): 11 mmHg RV (S/EDP): 48/12 mmHg PA (S/D, mean): 45/25 (32) mmHg PCWP (mean): 25-30 mmHg  Ao sat: 99% PA sat: 61%  Fick CO: 4.2 L/min Fick CI: 1.8 L/min/m^2  Thermodilution CO: 4.6 L/min Thermodilution CI: 2.0 L/min/m^2  Left Heart  Left Ventricle LV end diastolic pressure is severely elevated. LVEDP ~40 mmHg.  Aortic Valve There is severe aortic valve stenosis. The aortic valve is calcified. Mean gradient 65 mmHg. Peak-to-peak gradent: 71 mmHg. Valve area 0.5 cm^2.  Coronary Diagrams  Diagnostic Dominance: Right  Intervention  Implants   No implant documentation for this case.  Syngo Images  Show images for CARDIAC  CATHETERIZATION  Images on Long Term Storage  Show images for Caralyn, [place] to Procedure Log  Procedure Log    Hemo Data   Most Recent Value  Fick Cardiac Output 4.22 L/min  Fick Cardiac Output Index 1.84 (L/min)/BSA  Thermal Cardiac Output 4.55 L/min  Thermal Cardiac Output Index 1.98 (L/min)/BSA  Aortic Mean Gradient 64.5 mmHg  Aortic Peak Gradient 71 mmHg  Aortic Valve Area 0.49  Aortic Value Area Index 0.21 cm2/BSA  RA A Wave 10 mmHg  RA V Wave 8 mmHg  RA Mean 7 mmHg  RV Systolic Pressure 42 mmHg  RV Diastolic Pressure 8 mmHg  RV EDP 9 mmHg  PA Systolic Pressure 39 mmHg  PA Diastolic Pressure 21 mmHg  PA Mean 30 mmHg  PW A Wave 30 mmHg  PW V Wave 36 mmHg  PW Mean 28 mmHg  AO Systolic Pressure [id number] mmHg  AO Diastolic Pressure 81 mmHg  AO Mean 123XX123 mmHg  LV Systolic Pressure 123456 mmHg  LV Diastolic Pressure 25 mmHg  LV EDP 38 mmHg  AOp Systolic Pressure [id number] mmHg  AOp Diastolic Pressure 81 mmHg  AOp Mean Pressure 123XX123 mmHg  LVp Systolic Pressure 99991111 mmHg  LVp Diastolic Pressure 26 mmHg  LVp EDP Pressure 38 mmHg  TPVR Index 15.13 HRUI  TSVR Index 61.02 HRUI  PVR SVR Ratio 0.02  TPVR/TSVR Ratio 0.25    ADDENDUM REPORT: 04/03/2019 13:01  CLINICAL DATA:  Aortic stenosis  EXAM: Cardiac TAVR CT  TECHNIQUE: The patient was scanned on a Siemens Force [id number] slice scanner. A 120 kV retrospective scan was triggered in the descending thoracic aorta at 111 HU's. Gantry rotation speed was 270 msecs and collimation was .9 mm. No beta blockade or nitro were given. The 3D data set was reconstructed in 5% intervals of the R-R cycle. Systolic and diastolic phases were analyzed on a dedicated work station using MPR, MIP and VRT modes. The patient received 80 cc of contrast.  FINDINGS: Aortic Valve: Bicuspid heavily calcified with restricted leaflet motion  Aorta: Mildly aneurysmal with bovine arc and mild calcific atherosclerosis  Sinotubular  Junction: 28 mm  Ascending Thoracic Aorta: 37 mm  Aortic Arch: 24 mm  Descending Thoracic Aorta: 22 mm  Sinus of Valsalva Measurements:  Non-coronary: 31.7 mm  Right - coronary: 32.2 mm  Left - coronary: 32.9 mm  Coronary Artery Height above Annulus:  Left Main: 14.9 mm above annulus  Right Coronary: 14.3 mm above annulus  Virtual Basal Annulus Measurements:  Maximum/Minimum Diameter: 27.8 mm x 22.8 mm  Perimeter: 83 mm  Area: 517 mm2  Coronary Arteries: Sufficient height above annulus for deployment  Optimum Fluoroscopic Angle for Delivery: LAO 13 Cranial 3 degrees  IMPRESSION: 1. Bicuspid AV with annular area of 517 mm2 suitable for a 26 mm Sapien 3 Ultra valve  2.  Coronary arteries sufficient height above annulus for deployment  3.  Mild aortic root enlargement 3.7 cm with bovine arch  4. Optimum angiographic angle for deployment LAO 13 Cranial 3 degrees  Jenkins Rouge   Electronically Signed   By: Jenkins Rouge M.D.   On: 04/03/2019 13:01   Addended by Josue Hector, MD on 04/03/2019 1:03 PM    Study Result  EXAM: OVER-READ INTERPRETATION  CT CHEST  The following report is an over-read performed by radiologist Dr. Rolm Baptise of [facility], [place] on 04/03/2019. This over-read does not include interpretation of cardiac or coronary anatomy or pathology. The coronary CTA interpretation by the cardiologist is attached.  COMPARISON:  03/31/2019  FINDINGS: Vascular: Heart is normal size. Aorta is normal caliber. No dissection.  Mediastinum/Nodes: No adenopathy in the lower mediastinum or hila. Calcified right hilar lymph nodes.  Lungs/Pleura: Tree-in-bud nodular densities noted in the peripheral right middle lobe, right lower lobe and left lower lobe, similar to prior study. No effusions.  Upper Abdomen: Imaging into the upper abdomen shows no acute findings.  Musculoskeletal: Chest wall soft  tissues are unremarkable. No acute bony abnormality.  IMPRESSION: Clustered tree-in-bud nodular densities in the peripheral right middle lobe, right lower lobe and left lower lobe, likely related to infectious or inflammatory process/small airways disease.  No acute extra cardiac abnormality.  Electronically Signed: By: Rolm Baptise M.D. On: 04/03/2019 10:04      CLINICAL DATA:  Chest/back pain, acute, aortic dissection suspect  EXAM: CT ANGIOGRAPHY CHEST, ABDOMEN AND PELVIS  TECHNIQUE: Multidetector CT imaging through the chest, abdomen and pelvis was performed using the standard protocol during bolus administration of intravenous contrast. Multiplanar reconstructed images and MIPs were obtained  and reviewed to evaluate the vascular anatomy.  CONTRAST:  184mL OMNIPAQUE IOHEXOL 350 MG/ML SOLN  COMPARISON:  Chest radiograph yesterday. Abdominopelvic CT 03/31/2016  FINDINGS: CTA CHEST FINDINGS  Cardiovascular: Thoracic aorta is normal in caliber. No aortic dissection, acute aortic syndrome or aneurysm. Conventional branching pattern from the aortic arch. Aortic valvular calcifications. No central pulmonary embolus to the lobar level. Heart is normal in size. No pericardial effusion.  Mediastinum/Nodes: Calcified right hilar nodes consistent with prior granulomatous disease. No suspicious noncalcified adenopathy. Subcentimeter left thyroid nodule does not meet size criteria for further evaluation. Patulous esophagus. Small hiatal hernia.  Lungs/Pleura: Heterogeneous pulmonary parenchyma. 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 Clustered nodules in the subpleural right middle lobe, series 8, image 104, chronic and unchanged from 2017 abdominal CT. No pulmonary edema. No pleural fluid. The trachea and mainstem bronchi are patent.  Musculoskeletal: Mild multilevel degenerative change in  the spine. There are no acute or suspicious osseous abnormalities. Degenerative change in the right shoulder.  Review of the MIP images confirms the above findings.  CTA ABDOMEN AND PELVIS FINDINGS  VASCULAR  Aorta: Normal caliber aorta without aneurysm, dissection, vasculitis or significant stenosis. Mild atherosclerosis.  Celiac: Patent without evidence of aneurysm, dissection, vasculitis or significant stenosis.  SMA: Patent without evidence of aneurysm, dissection, vasculitis or significant stenosis.  Renals: Both renal arteries are patent without evidence of aneurysm, dissection, vasculitis, fibromuscular dysplasia or significant stenosis.  IMA: Patent without evidence of aneurysm, dissection, vasculitis or significant stenosis.  Inflow: Patent without evidence of aneurysm, dissection, vasculitis or significant stenosis.  Veins: Limited assessment on this arterial phase exam.  Review of the MIP images confirms the above findings.  NON-VASCULAR  Hepatobiliary: No focal liver abnormality is seen. Status post cholecystectomy. No biliary dilatation.  Pancreas: No ductal dilatation or inflammation.  Spleen: Normal in size and arterial enhancement.  Adrenals/Urinary Tract: No adrenal nodule. Suggestion of slight left renal atrophy. No hydronephrosis. Tiny cortical hypodensities in the left kidney are too small to characterize. Urinary bladder partially distended. No bladder wall thickening.  Stomach/Bowel: Small hiatal hernia. Stomach is decompressed and not well assessed. No small bowel wall thickening or inflammatory change. Prior appendectomy with surgical clips in the right lower quadrant. Small volume of stool throughout the colon. Colonic diverticulosis from the splenic flexure distally, prominent in the sigmoid. No evidence of diverticulitis.  Lymphatic: No abdominopelvic adenopathy.  Reproductive: Status post hysterectomy. No adnexal  masses.  Other: Postsurgical change of the right anterior abdominal wall with mesh. Fat containing umbilical hernia. No free air or free fluid.  Musculoskeletal: Right hip arthroplasty. Degenerative change in the spine with multilevel endplate changes. There are no acute or suspicious osseous abnormalities.  Review of the MIP images confirms the above findings.  IMPRESSION: 1. No aortic dissection or acute aortic abnormality. Mild thoracoabdominal aortic atherosclerosis. Aortic valvular calcifications, consider further evaluation with echocardiogram. 2. No acute abnormality in the chest, abdomen, or pelvis. 3. Incidental colonic diverticulosis without diverticulitis. Small hiatal hernia. 4. Right abdominal wall hernia repair with mesh. Fat containing umbilical hernia.  Aortic Atherosclerosis (ICD10-I70.0).   Electronically Signed   By: Keith Rake M.D.   On: 03/31/2019 02:42   STS Risk Calculator:  Procedure: Isolated AVR  Risk of Mortality:  1.117%   Renal Failure:  0.668%   Permanent Stroke:  0.589%   Prolonged Ventilation:  3.726%   DSW Infection:  0.150%   Reoperation:  2.123%  Morbidity or Mortality:  6.480%   Short Length of Stay:  53.647%   Long Length of Stay:  2.254%     Impression:  The patient is a 63 year old woman with stage D, severe, symptomatic aortic stenosis with New York Heart Association class III symptoms of exertional fatigue associated with some shortness of breath and chest discomfort consistent with chronic diastolic congestive heart failure.  She had a severe episode of chest pain leading up to her recent admission.  I have personally reviewed her 2D echocardiogram, cardiac catheterization, and CTA studies.  Her echocardiogram shows a severely calcified aortic valve with a mean gradient of 71.8 mmHg and a peak gradient of 105 mmHg consistent with severe aortic stenosis.  There is mild aortic insufficiency.  Left ventricular  systolic function is normal.  Cardiac catheterization shows normal coronary arteries.  The mean aortic valve gradient was measured at 65 mmHg with a calculated aortic valve area of 0.5 cm.  I agree that aortic valve placement is indicated in this patient.  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  Her gated cardiac CTA shows anatomy suitable for transcatheter aortic valve replacement using a SAPIEN 3 valve.  Her abdominal and pelvic CTA shows adequate pelvic vascular anatomy to allow transfemoral insertion.  The patient was counseled at length regarding treatment alternatives for management of severe symptomatic aortic stenosis. The risks and benefits of surgical intervention has been discussed in detail. Long-term prognosis with medical therapy was discussed. Alternative approaches such as conventional surgical aortic valve replacement, transcatheter aortic valve replacement, and palliative medical therapy were compared and contrasted at length. This discussion was placed in the context of the patient's own specific clinical presentation and past medical history. All of her questions havebeen addressed.    Following the decision to proceed with transcatheter aortic valve replacement, a discussion was held regarding what types of management strategies would be attempted intraoperatively in the event of life-threatening complications, including whether or not the patient would be considered a candidate for the use of cardiopulmonary bypass and/or conversion to open sternotomy for attempted surgical intervention.  She is a low surgical risk patient and only 63 years old so I think she would be a candidate for emergent sternotomy to manage any intraoperative complications although she would have a slow recovery due to her nonambulatory status.  The patient has been advised of a variety of complications that might develop  including but not limited to risks of death, stroke, paravalvular leak, aortic dissection or other major vascular complications, aortic annulus rupture, device embolization, cardiac rupture or perforation, mitral regurgitation, acute myocardial infarction, arrhythmia, heart block or bradycardia requiring permanent pacemaker placement, congestive heart failure, respiratory failure, renal failure, pneumonia, infection, other late complications related to structural valve deterioration or migration, or other complications that might ultimately cause a temporary or permanent loss of functional independence or other long term morbidity. The patient provides full informed consent for the procedure as described and all questions were answered.     Plan:  She will be scheduled for transfemoral transcatheter aortic valve placement on Tuesday, April 17, 2019.   I spent 60 minutes performing this consultation and > 50% of this time was spent face to face counseling and coordinating the care of this patient's severe symptomatic aortic stenosis.      Gaye Pollack, MD 04/11/2019

## 2019-04-13 ENCOUNTER — Other Ambulatory Visit (HOSPITAL_COMMUNITY): Payer: Medicare Other

## 2019-04-13 ENCOUNTER — Other Ambulatory Visit: Payer: Self-pay

## 2019-04-13 ENCOUNTER — Ambulatory Visit (HOSPITAL_COMMUNITY)
Admission: RE | Admit: 2019-04-13 | Discharge: 2019-04-13 | Disposition: A | Discharge status: Home / Self Care | Payer: Medicare Other | Source: Ambulatory Visit | Attending: Cardiovascular Disease | Admitting: Cardiovascular Disease

## 2019-04-13 ENCOUNTER — Encounter (HOSPITAL_COMMUNITY): Payer: Self-pay

## 2019-04-13 ENCOUNTER — Encounter (HOSPITAL_COMMUNITY)
Admit: 2019-04-13 | Discharge: 2019-04-13 | Disposition: A | Discharge status: Home / Self Care | Payer: Medicare Other | Attending: Cardiovascular Disease | Admitting: Cardiovascular Disease

## 2019-04-13 DIAGNOSIS — I252 Old myocardial infarction: Secondary | ICD-10-CM | POA: Insufficient documentation

## 2019-04-13 DIAGNOSIS — F329 Major depressive disorder, single episode, unspecified: Secondary | ICD-10-CM | POA: Insufficient documentation

## 2019-04-13 DIAGNOSIS — Z6839 Body mass index (BMI) 39.0-39.9, adult: Secondary | ICD-10-CM | POA: Insufficient documentation

## 2019-04-13 DIAGNOSIS — F419 Anxiety disorder, unspecified: Secondary | ICD-10-CM | POA: Insufficient documentation

## 2019-04-13 DIAGNOSIS — Z79899 Other long term (current) drug therapy: Secondary | ICD-10-CM | POA: Insufficient documentation

## 2019-04-13 DIAGNOSIS — I5032 Chronic diastolic (congestive) heart failure: Secondary | ICD-10-CM | POA: Diagnosis not present

## 2019-04-13 DIAGNOSIS — G4733 Obstructive sleep apnea (adult) (pediatric): Secondary | ICD-10-CM | POA: Insufficient documentation

## 2019-04-13 DIAGNOSIS — K219 Gastro-esophageal reflux disease without esophagitis: Secondary | ICD-10-CM | POA: Diagnosis not present

## 2019-04-13 DIAGNOSIS — G14 Postpolio syndrome: Secondary | ICD-10-CM | POA: Diagnosis not present

## 2019-04-13 DIAGNOSIS — I350 Nonrheumatic aortic (valve) stenosis: Secondary | ICD-10-CM | POA: Insufficient documentation

## 2019-04-13 DIAGNOSIS — E785 Hyperlipidemia, unspecified: Secondary | ICD-10-CM | POA: Insufficient documentation

## 2019-04-13 DIAGNOSIS — Z01818 Encounter for other preprocedural examination: Secondary | ICD-10-CM | POA: Diagnosis present

## 2019-04-13 HISTORY — DX: Myoneural disorder, unspecified: G70.9

## 2019-04-13 HISTORY — DX: Other specified postprocedural states: R11.2

## 2019-04-13 HISTORY — DX: Other specified postprocedural states: Z98.890

## 2019-04-13 HISTORY — DX: Other complications of anesthesia, initial encounter: T88.59XA

## 2019-04-13 HISTORY — DX: Atherosclerotic heart disease of native coronary artery without angina pectoris: I25.10

## 2019-04-13 LAB — SURGICAL PCR SCREEN
MRSA, PCR: NEGATIVE
Staphylococcus aureus: NEGATIVE

## 2019-04-13 LAB — BLOOD GAS, ARTERIAL
Acid-Base Excess: 0.3 mmol/L (ref 0.0–2.0)
Bicarbonate: 24.5 mmol/L (ref 20.0–28.0)
FIO2: 21
O2 Saturation: 96.3 %
Patient temperature: 37
pCO2 arterial: 40 mmHg (ref 32.0–48.0)
pH, Arterial: 7.403 (ref 7.350–7.450)
pO2, Arterial: 81.9 mmHg — ABNORMAL LOW (ref 83.0–108.0)

## 2019-04-13 LAB — URINALYSIS, ROUTINE W REFLEX MICROSCOPIC
Bilirubin Urine: NEGATIVE
Glucose, UA: NEGATIVE mg/dL
Hgb urine dipstick: NEGATIVE
Ketones, ur: NEGATIVE mg/dL
Leukocytes,Ua: NEGATIVE
Nitrite: NEGATIVE
Protein, ur: NEGATIVE mg/dL
Specific Gravity, Urine: 1.017 (ref 1.005–1.030)
pH: 5 (ref 5.0–8.0)

## 2019-04-13 LAB — HEMOGLOBIN A1C
Hgb A1c MFr Bld: 5.5 % (ref 4.8–5.6)
Mean Plasma Glucose: 111.15 mg/dL

## 2019-04-13 LAB — COMPREHENSIVE METABOLIC PANEL
ALT: 16 U/L (ref 0–44)
AST: 22 U/L (ref 15–41)
Albumin: 3.8 g/dL (ref 3.5–5.0)
Alkaline Phosphatase: 84 U/L (ref 38–126)
Anion gap: 10 (ref 5–15)
BUN: 14 mg/dL (ref 8–23)
CO2: 22 mmol/L (ref 22–32)
Calcium: 9 mg/dL (ref 8.9–10.3)
Chloride: 107 mmol/L (ref 98–111)
Creatinine, Ser: 0.72 mg/dL (ref 0.44–1.00)
GFR calc Af Amer: >60 mL/min (ref >60)
GFR calc non Af Amer: >60 mL/min (ref >60)
Glucose, Bld: 81 mg/dL (ref 70–99)
Potassium: 4.2 mmol/L (ref 3.5–5.1)
Sodium: 139 mmol/L (ref 135–145)
Total Bilirubin: 0.5 mg/dL (ref 0.3–1.2)
Total Protein: 7.4 g/dL (ref 6.5–8.1)

## 2019-04-13 LAB — BRAIN NATRIURETIC PEPTIDE: B Natriuretic Peptide: 152.1 pg/mL — ABNORMAL HIGH (ref 0.0–100.0)

## 2019-04-13 LAB — CBC
HCT: 43.7 % (ref 36.0–46.0)
Hemoglobin: 14.5 g/dL (ref 12.0–15.0)
MCH: 31.3 pg (ref 26.0–34.0)
MCHC: 33.2 g/dL (ref 30.0–36.0)
MCV: 94.4 fL (ref 80.0–100.0)
Platelets: 312 10*3/uL (ref 150–400)
RBC: 4.63 MIL/uL (ref 3.87–5.11)
RDW: 13.2 % (ref 11.5–15.5)
WBC: 7.2 10*3/uL (ref 4.0–10.5)
nRBC: 0 % (ref 0.0–0.2)

## 2019-04-13 LAB — ABO/RH: ABO/RH(D): O POS

## 2019-04-13 LAB — TYPE AND SCREEN
ABO/RH(D): O POS
Antibody Screen: NEGATIVE

## 2019-04-13 LAB — PROTIME-INR
INR: 1 (ref 0.8–1.2)
Prothrombin Time: 13.4 seconds (ref 11.4–15.2)

## 2019-04-13 LAB — APTT: aPTT: 32 seconds (ref 24–36)

## 2019-04-13 NOTE — Progress Notes (Signed)
[id number] [place], [place] [address] [place] [address] Phone: [phone] Fax: [phone]  [id number] [place], [place] [id number] [place] [id number] Phone: [phone] Fax: [phone]      Your procedure is scheduled on Tuesday, November 3rd, 2020.   Report to [facility] Main Entrance "A" at 10:15 A.M., and check in at the Admitting office.   Call this number if you have problems the morning of surgery:  [phone]  Call [phone] if you have any questions prior to your surgery date Monday-Friday 8am-4pm    Remember:  Do not eat or drink after midnight the night before your surgery   Take these medicines the morning of surgery with A SIP OF WATER :  Gabapentin (Neurontin) Pantoprazole (Protonix) Venlafaxine XR (Effexor XR)  If needed: Alprazolam (Xanax)  7 days prior to surgery STOP taking any Aspirin (unless otherwise instructed by your surgeon), Aleve, Naproxen, Ibuprofen, Motrin, Advil, Goody's, BC's, all herbal medications, fish oil, and all vitamins.    The Morning of Surgery  Do not wear jewelry, make-up or nail polish.  Do not wear lotions, powders, or perfumes/colognes, or deodorant  Do not shave 48 hours prior to surgery.  Men may shave face and neck.  Do not bring valuables to the hospital.  [facility] is not responsible for any belongings or valuables.  If you are a smoker, DO NOT Smoke 24 hours prior to surgery IF you wear a CPAP at night please bring your mask, tubing, and machine the morning of surgery   Remember that you must have someone to transport you home after your surgery, and remain with you for 24 hours if you are discharged the same day.   Contacts, glasses, hearing aids, dentures or bridgework may not be worn into surgery.    Leave your suitcase in the car.  After  surgery it may be brought to your room.  For patients admitted to the hospital, discharge time will be determined by your treatment team.  Patients discharged the day of surgery will not be allowed to drive home.    Special instructions:   [place]- Preparing For Surgery  Before surgery, you can play an important role. Because skin is not sterile, your skin needs to be as free of germs as possible. You can reduce the number of germs on your skin by washing with CHG (chlorahexidine gluconate) Soap before surgery.  CHG is an antiseptic cleaner which kills germs and bonds with the skin to continue killing germs even after washing.    Oral Hygiene is also important to reduce your risk of infection.  Remember - BRUSH YOUR TEETH THE MORNING OF SURGERY WITH YOUR REGULAR TOOTHPASTE  Please do not use if you have an allergy to CHG or antibacterial soaps. If your skin becomes reddened/irritated stop using the CHG.  Do not shave (including legs and underarms) for at least 48 hours prior to first CHG shower. It is OK to shave your face.  Please follow these instructions carefully.   1. Shower the NIGHT BEFORE SURGERY and the MORNING OF SURGERY with CHG Soap.   2. If you chose to wash your hair, wash your hair first as usual with your normal shampoo.  3. After you shampoo, rinse your hair and body thoroughly to remove the shampoo.  4. Use CHG as you would  any other liquid soap. You can apply CHG directly to the skin and wash gently with a scrungie or a clean washcloth.   5. Apply the CHG Soap to your body ONLY FROM THE NECK DOWN.  Do not use on open wounds or open sores. Avoid contact with your eyes, ears, mouth and genitals (private parts). Wash Face and genitals (private parts)  with your normal soap.   6. Wash thoroughly, paying special attention to the area where your surgery will be performed.  7. Thoroughly rinse your body with warm water from the neck down.  8. DO NOT shower/wash with  your normal soap after using and rinsing off the CHG Soap.  9. Pat yourself dry with a CLEAN TOWEL.  10. Wear CLEAN PAJAMAS to bed the night before surgery, wear comfortable clothes the morning of surgery  11. Place CLEAN SHEETS on your bed the night of your first shower and DO NOT SLEEP WITH PETS.    Day of Surgery:  Do not apply any deodorants/lotions. Please shower the morning of surgery with the CHG soap  Please wear clean clothes to the hospital/surgery center.   Remember to brush your teeth WITH YOUR REGULAR TOOTHPASTE.   Please read over the following fact sheets that you were given.

## 2019-04-13 NOTE — Progress Notes (Signed)
PCP - [name], MD Cardiologist - Sherren Mocha, MD  Chest x-ray - 04/13/19  EKG - 04/05/19 Stress Test - denies ECHO - 03/31/19 Cardiac Cath - 04/02/19  Sleep Study - unknown CPAP - yes; instructed to bring her mask, tubing and machine  Blood Thinner Instructions: N/A Aspirin Instructions: N/A  COVID TEST- scheduled tomorrow 04/14/19; patient informed of need to self-quarantine and states verbal understanding.  Anesthesia review: Yes; NSTEMI 03/31/19; positive for murmur, severe aortic stenosis  Patient denies shortness of breath, fever, cough and chest pain at PAT appointment  All instructions explained to the patient, with a verbal understanding of the material. Patient agrees to go over the instructions while at home for a better understanding. Patient also instructed to self quarantine after being tested for COVID-19. The opportunity to ask questions was provided.

## 2019-04-14 ENCOUNTER — Other Ambulatory Visit (HOSPITAL_COMMUNITY)
Admission: RE | Admit: 2019-04-14 | Discharge: 2019-04-14 | Disposition: A | Discharge status: Home / Self Care | Payer: Medicare Other | Source: Ambulatory Visit | Attending: Cardiovascular Disease | Admitting: Cardiovascular Disease

## 2019-04-14 DIAGNOSIS — Z01812 Encounter for preprocedural laboratory examination: Secondary | ICD-10-CM | POA: Insufficient documentation

## 2019-04-14 DIAGNOSIS — Z20828 Contact with and (suspected) exposure to other viral communicable diseases: Secondary | ICD-10-CM | POA: Insufficient documentation

## 2019-04-14 LAB — SARS CORONAVIRUS 2 (TAT 6-24 HRS): SARS Coronavirus 2: NEGATIVE

## 2019-04-16 MED ORDER — VANCOMYCIN HCL 10 G IV SOLR
1500.0000 mg | INTRAVENOUS | Status: AC
  Administered 2019-04-17: 1500 mg via INTRAVENOUS
  Filled 2019-04-16: qty 1500

## 2019-04-16 MED ORDER — MAGNESIUM SULFATE 50 % IJ SOLN
40.0000 meq | INTRAMUSCULAR | Status: DC
  Filled 2019-04-16: qty 9.85

## 2019-04-16 MED ORDER — LEVOFLOXACIN IN D5W 500 MG/100ML IV SOLN
500.0000 mg | INTRAVENOUS | Status: AC
  Administered 2019-04-17: 500 mg via INTRAVENOUS
  Filled 2019-04-16: qty 100

## 2019-04-16 MED ORDER — SODIUM CHLORIDE 0.9 % IV SOLN
INTRAVENOUS | Status: DC
  Filled 2019-04-16: qty 30

## 2019-04-16 MED ORDER — POTASSIUM CHLORIDE 2 MEQ/ML IV SOLN
80.0000 meq | INTRAVENOUS | Status: DC
  Filled 2019-04-16: qty 40

## 2019-04-16 MED ORDER — DEXMEDETOMIDINE HCL IN NACL 400 MCG/100ML IV SOLN
0.1000 ug/kg/h | INTRAVENOUS | Status: AC
  Administered 2019-04-17: 1 ug/kg/h via INTRAVENOUS
  Filled 2019-04-16: qty 100

## 2019-04-16 MED ORDER — NOREPINEPHRINE 4 MG/250ML-% IV SOLN
0.0000 ug/min | INTRAVENOUS | Status: AC
  Administered 2019-04-17: 1 ug/min via INTRAVENOUS
  Filled 2019-04-16: qty 250

## 2019-04-16 NOTE — Progress Notes (Signed)
Anesthesia Chart Review:  Case: [id number] Date/Time: 04/17/19 1200   Procedures:      TRANSCATHETER AORTIC VALVE REPLACEMENT, TRANSFEMORAL (N/A )     TRANSESOPHAGEAL ECHOCARDIOGRAM (TEE) (N/A )   Anesthesia type: General   Pre-op diagnosis: Severe Aortic Stenosis   Location: [facility] CATH LAB 6 / [facility]   Provider: Sherren Mocha, MD    CT surgeon: Gilford Raid, MD  DISCUSSION: Patient is a 63 year old female scheduled for the above procedure.  History includes never smoker, postoperative N/V, severe aortic stenosis, NSTEMI 03/30/19 (no significant CAD, severe AS), chronic diastolic CHF, post polio syndrome (with LLE paralysis), peripheral edema, GERD, HLD, OSA (CPAP), obesity. By notes, referred to rheumatology ~ 2015 for evaluation of possible connective tissue disease.    04/14/19 COVID-19 test negative.  Anesthesia team to evaluate on the day of surgery.   VS: BP (!) 118/52   Pulse 74   Temp 37.1 C   Resp 20   Ht 5\' 8"  (1.727 m)   Wt 118.8 kg   SpO2 97%   BMI 39.84 kg/m    PROVIDERS: [name], MD is PCP Minus Breeding, MD is cardiologist (last seen 2015), saw Sherren Mocha, MD 03/2019 for TAVR evaluation.    LABS: Labs reviewed: Acceptable for surgery. (all labs ordered are listed, but only abnormal results are displayed)  Labs Reviewed  BLOOD GAS, ARTERIAL - Abnormal; Notable for the following components:      Result Value   pO2, Arterial 81.9 (*)    All other components within normal limits  BRAIN NATRIURETIC PEPTIDE - Abnormal; Notable for the following components:   B Natriuretic Peptide 152.1 (*)    All other components within normal limits  SURGICAL PCR SCREEN  APTT  CBC  COMPREHENSIVE METABOLIC PANEL  HEMOGLOBIN A1C  PROTIME-INR  URINALYSIS, ROUTINE W REFLEX MICROSCOPIC  TYPE AND SCREEN  ABO/RH     IMAGES: CXR 04/13/19: IMPRESSION: Negative for acute cardiopulmonary disease. Aortic valve calcifications   EKG: 04/05/2019:  Sinus rhythm.  Abnormal R wave progression, early transition.  LVH with secondary repolarization abnormality.   CV: LUE venous US 04/06/19: IMPRESSION: 1. No evidence of DVT within the left upper extremity. 2. Examination is positive for occlusive superficial thrombophlebitis involving the cephalic and basilic veins at the level of the forearm. There is no extension of this distal occlusive SVT to the more proximal superficial venous system or to the deep venous system of the left upper extremity.   CT Coronary 04/03/19: IMPRESSION: 1. Bicuspid AV with annular area of 517 mm2 suitable for a 26 mm Sapien 3 Ultra valve 2.  Coronary arteries sufficient height above annulus for deployment 3.  Mild aortic root enlargement 3.7 cm with bovine arch 4. Optimum angiographic angle for deployment LAO 13 Cranial 3 degrees   RHC/LHC 04/02/19: RA (mean): 11 mmHg RV (S/EDP): 48/12 mmHg PA (S/D, mean): 45/25 (32) mmHg PCWP (mean): 25-30 mmHg  Ao sat: 99% PA sat: 61%  Fick CO: 4.2 L/min Fick CI: 1.8 L/min/m^2  Thermodilution CO: 4.6 L/min Thermodilution CI: 2.0 L/min/m^2  Conclusions: 1. No angiographically significant CAD. 2. Severe aortic stenosis (mean gradient 65 mmHg, calculated valve area 0.5 cm^2). 3. Moderately to severely elevated left ventricular filling pressure. 4. Mildly elevated right and pulmonary artery pressures. 5. Moderately reduced cardiac output. Recommendations: 1. Medical therapy, including careful blood pressure control and diuresis. 2. Valve team consultation for further workup/management of severe aortic stenosis.   Echo 03/31/19: IMPRESSIONS  1. Limited  images.  2. Left ventricular ejection fraction, by visual estimation, is 55%. The left ventricle has normal function. There is mildly increased left ventricular hypertrophy.  3. Definity contrast agent was given IV to delineate the left ventricular endocardial borders.  4. Left ventricular diastolic  Doppler parameters are consistent with impaired relaxation pattern of LV diastolic filling.  5. Global right ventricle was not well visualized.The right ventricular size is not well visualized. Right vetricular wall thickness was not assessed.  6. Left atrial size was normal.  7. Right atrial size was normal.  8. Mild mitral annular calcification.  9. The mitral valve is grossly normal. No evidence of mitral valve regurgitation. 10. The tricuspid valve is not well visualized. Tricuspid valve regurgitation is trivial. 11. Aortic valve mean gradient measures 71.8 mmHg. 12. The aortic valve was not well visualized but is calcified with restricted motion. Aortic valve regurgitation is mild by color flow Doppler. Severe aortic valve stenosis is suggested by gradients. Suggest TEE to further assess aortic valve structure. 13. The pulmonic valve was not well visualized. Pulmonic valve regurgitation was not assessed by color flow Doppler. 14. Aortic dilatation noted. 15. There is mild dilatation of the ascending aorta. 16. The inferior vena cava is normal in size with greater than 50% respiratory variability, suggesting right atrial pressure of 3 mmHg. 17. The interatrial septum was not well visualized. 18. TR signal is inadequate for assessing pulmonary artery systolic pressure. (Per Dr. Burt Knack 04/03/19, "TEE was recommended due to poor images on TTE. However, upon review of CT scan and cath, her valve appears to be heavily calcified c/w severe AS, and we do not feel TEE is necessary.")   Carotid US 04/02/19: Summary: - Right Carotid: The extracranial vessels were near-normal with only minimal wall                thickening or plaque. - Left Carotid: The extracranial vessels were near-normal with only minimal wall               thickening or plaque. - Vertebrals:  Bilateral vertebral arteries demonstrate antegrade flow. - Subclavians: Normal flow hemodynamics were seen in bilateral subclavian               arteries.   Past Medical History:  Diagnosis Date  . Anxiety   . Arthritis    low back  . Cervical pain (neck)   . Complication of anesthesia    takes awhile to start breathing independently, coach to breathe  . Connective tissue disease ([place])    questionable Lupus (she has yet to see a rheumatologist)  . Coronary artery disease   . Depression   . Diverticulitis   . GERD (gastroesophageal reflux disease)   . Heart murmur   . History of fracture of left hip   . Hyperlipidemia   . Insomnia   . Morbid obesity ([place])   . Neuromuscular disorder ([place])    Post polio syndrome  . OSA on CPAP   . 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 Peripheral edema   . PONV (postoperative nausea and vomiting)   . Post-polio syndrome    With left leg paralysis  . Post-viral reactive airway disease   . Severe aortic stenosis   . Sigmoid diverticulosis     Past Surgical History:  Procedure Laterality Date  . ANKLE FUSION     left  . APPENDECTOMY    . CHOLECYSTECTOMY    .  EYE SURGERY Bilateral 2020   cataracts removed August and September 2020  . JOINT REPLACEMENT Right 2003   three replacements; 2003 is last one  . KNEE ARTHROPLASTY    . LEG SURGERY     muscle surgery related to polio  . RECTAL PROLAPSE REPAIR  2020   pessary placed  . RIGHT/LEFT HEART CATH AND CORONARY ANGIOGRAPHY N/A 04/02/2019   Procedure: RIGHT/LEFT HEART CATH AND CORONARY ANGIOGRAPHY;  Surgeon: Nelva Bush, MD;  Location: [facility];  Service: Cardiovascular;  Laterality: N/A;  . TOTAL HIP ARTHROPLASTY     right x 3  . UMBILICAL HERNIA REPAIR      MEDICATIONS: . ALPRAZolam (XANAX) 0.5 MG tablet  . Cholecalciferol (D3 HIGH POTENCY) 1000 UNITS capsule  . docusate sodium (COLACE) 100 MG capsule  . gabapentin (NEURONTIN) 300 MG capsule  . pantoprazole (PROTONIX) 40 MG tablet  . traZODone (DESYREL) 100 MG tablet  . venlafaxine XR (EFFEXOR-XR) 75 MG 24 hr capsule   . vitamin B-12 (CYANOCOBALAMIN) 250 MCG tablet   No current facility-administered medications for this encounter.    Derrill Memo ON 04/17/2019] dexmedetomidine (PRECEDEX) 400 MCG/100ML (4 mcg/mL) infusion  . [START ON 04/17/2019] heparin 30,000 units/NS 1000 mL solution for CELLSAVER  . [START ON 04/17/2019] levofloxacin (LEVAQUIN) IVPB 500 mg  . [START ON 04/17/2019] magnesium sulfate (IV Push/IM) injection 40 mEq  . [START ON 04/17/2019] norepinephrine (LEVOPHED) 4mg  in 290mL premix infusion  . [START ON 04/17/2019] potassium chloride injection 80 mEq  . [START ON 04/17/2019] vancomycin (VANCOCIN) 1,500 mg in sodium chloride 0.9 % 250 mL IVPB     Myra Gianotti, PA-C Surgical Short Stay/Anesthesiology [facility] Phone [phone] [facility] Phone [phone] 04/16/2019 11:33 AM

## 2019-04-16 NOTE — H&P (Signed)
[address]       [name] [address]             [phone]      Cardiothoracic Surgery Admission History and Physical   Referring Provider is Belva Crome, MD  Primary Cardiologist is Minus Breeding, MD  PCP is [name], MD      Chief Complaint  Patient presents with  . 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 She had an echocardiogram in July 2015 which showed mild to moderate aortic stenosis with a mean gradient of 24 mmHg and a left ventricular ejection fraction of 55 to 60%. She reports developing exertional fatigue during the summer 2019. She noted onset of some shortness of breath this past spring that seem to be worse when she was laying flat. She has had lower extremity edema for several years that has been worse in the left leg. She reports some intermittent chest pain over the past several months which she did not think much about. Then recently she had an episode of severe knifelike central chest pain when she was trying to reposition herself while lying in bed. There was some radiation to her jaw. She said that she almost called 911 but it improved after about 5 minutes and resolved. She was seen by her PCP the following day and had an abnormal electrocardiogram and was sent to the [facility], ER. In the ER she had an EKG showing lateral and anterolateral ST-T wave abnormalities that were more prominent anterolaterally compared to her previous tracings. High-sensitivity troponin was 41---> 46--> 50--> 51 --> 49. A CTA of the chest, abdomen, and pelvis showed no acute abnormality. She had a repeat 2D echocardiogram that showed severe aortic stenosis with a mean gradient of 71.8 mmHg with a peak gradient of 105 mmHg. Dimensionless index was  0.14. Cardiac catheterization was performed on 04/02/2019 and showed normal coronary arteries with severe aortic stenosis. The mean gradient was measured at 65 mmHg. There was moderate to severely elevated left ventricular filling pressure and mildly elevated right sided heart pressures. She improved with blood pressure control and diuresis and was discharged after evaluation by Dr. Burt Knack for consideration of TAVR.  The patient has been wheelchair-bound since [id number] due to complications of polio. She has paralysis of her left leg. She subsequently underwent 3 right hip replacements over the years. Despite this she has remained active and totally independent living alone at home. She drives a car. She is able to transfer herself from her wheelchair independently. 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 She has noted that over the past year she has had to cut back on her activities due to fatigue and tiredness.       Past Medical History:  Diagnosis Date  . Anxiety   . Arthritis    low back  . Cervical pain (neck)   . Connective tissue disease ([place])    questionable Lupus (she has yet to see a rheumatologist)  . Depression   . Diverticulitis   . GERD (gastroesophageal reflux disease)   . History  of fracture of left hip   . Hyperlipidemia   . Insomnia   . Morbid obesity ([place])   . OSA on CPAP   . Peripheral edema   . Post-polio syndrome    With left leg paralysis  . Post-viral reactive airway disease   . Severe aortic stenosis   . Sigmoid diverticulosis         Past Surgical History:  Procedure Laterality Date  . ANKLE FUSION     left  . APPENDECTOMY    . CHOLECYSTECTOMY    . KNEE ARTHROPLASTY    . LEG SURGERY     muscle surgery related to polio  . RIGHT/LEFT HEART CATH AND CORONARY ANGIOGRAPHY N/A 04/02/2019    Procedure: RIGHT/LEFT HEART CATH AND CORONARY ANGIOGRAPHY; Surgeon: Nelva Bush, MD; Location: [place] [facility]; Service: Cardiovascular; Laterality: N/A;  . TOTAL HIP ARTHROPLASTY     right x 3  . UMBILICAL HERNIA REPAIR          Family History  Problem Relation Age of Onset  . Cancer Mother   . Heart disease Father   . CAD Other    FAM Hx OF CAD  . Diabetes Other    FAM Hx of DM  . Hypertension Other    Social History        Socioeconomic History  . Marital status: Single    Spouse name: Not on file  . Number of children: Not on file  . Years of education: Not on file  . Highest education level: Not on file  Occupational History  . Not on file  Social Needs  . Financial resource strain: Not on file  . Food insecurity    Worry: Not on file    Inability: Not on file  . Transportation needs    Medical: Not on file    Non-medical: Not on file  Tobacco Use  . Smoking status: Never Smoker  . Smokeless tobacco: Never Used  Substance and Sexual Activity  . Alcohol use: No  . Drug use: Never  . Sexual activity: Not on file  Lifestyle  . Physical activity    Days per week: Not on file    Minutes per session: Not on file  . Stress: Not on file  Relationships  . Social Herbalist on phone: Not on file    Gets together: Not on file    Attends religious service: Not on file    Active member of club or organization: Not on file    Attends meetings of clubs or organizations: Not on file    Relationship status: Not on file  . Intimate partner violence    Fear of current or ex partner: Not on file    Emotionally abused: Not on file    Physically abused: Not on file    Forced sexual activity: Not on file  Other Topics Concern  . Not on file  Social History Narrative   Lives alone.         Current Outpatient Medications  Medication Sig Dispense Refill  . ALPRAZolam (XANAX) 0.5 MG tablet Take 0.5 mg by mouth 2 (two) times daily as needed for sleep.      . Cholecalciferol (D3 HIGH POTENCY) 1000 UNITS capsule Take 1,000 Units by mouth daily.    Marland Kitchen docusate sodium (COLACE) 100 MG capsule Take 100 mg by mouth daily.    Marland Kitchen gabapentin (NEURONTIN) 300 MG capsule Take 300 mg by mouth daily.    Marland Kitchen  pantoprazole (PROTONIX) 40 MG tablet Take 40 mg by mouth daily.    . traZODone (DESYREL) 100 MG tablet Take 100 mg by mouth at bedtime.     Marland Kitchen venlafaxine XR (EFFEXOR-XR) 75 MG 24 hr capsule Take 75 mg by mouth daily with breakfast.    . vitamin B-12 (CYANOCOBALAMIN) 250 MCG tablet Take 250 mcg by mouth daily.     No current facility-administered medications for this visit.         Allergies  Allergen Reactions  . Flagyl [Metronidazole] Anaphylaxis  . Amoxicillin Swelling  . Other Nausea Only    Tylenol with Codeine #3  . Percocet [Oxycodone-Acetaminophen] Nausea Only  . Plaquenil [Hydroxychloroquine Sulfate] Other (See Comments)    Blurred vision, joint pain  . Sulfa Antibiotics Nausea Only   Review of Systems:   General: normal appetite, + decreased energy, no weight gain, no weight loss, no fever  Cardiac: no chest pain with exertion, + chest pain at rest, +SOB with moderate exertion, no resting SOB, no PND, no orthopnea, no palpitations, no arrhythmia, no atrial fibrillation, + LE edema, + dizzy spells, no syncope  Respiratory: + mild exertional shortness of breath, no home oxygen, no productive cough, no dry cough, no bronchitis, no wheezing, no hemoptysis, no asthma, no pain with inspiration or cough, + sleep apnea, + CPAP at night  GI: no difficulty swallowing, no reflux, no frequent heartburn, no hiatal hernia, no abdominal pain, no constipation, no diarrhea, no hematochezia, no hematemesis, no melena  GU: no dysuria, no frequency, no urinary tract infection, no hematuria, no kidney stones, no kidney disease  Vascular: no pain suggestive of claudication, no pain in feet, no leg cramps, no varicose veins, no DVT, no non-healing foot ulcer   Neuro: no stroke, no TIA's, no seizures, no headaches, no temporary blindness one eye, no slurred speech, no peripheral neuropathy, no chronic pain, +unable to walk no memory/cognitive dysfunction  Musculoskeletal: + arthritis, no joint swelling, no myalgias, non-ambulatory, reduced mobility  Skin: no rash, no itching, no skin infections, no pressure sores or ulcerations  Psych: + anxiety, no depression, no nervousness, no unusual recent stress  Eyes: no blurry vision, + floaters, no recent vision changes, + wears glasses or contacts  ENT: no hearing loss, no loose or painful teeth, no dentures, last saw dentist recently  Hematologic: no easy bruising, no abnormal bleeding, no clotting disorder, no frequent epistaxis  Endocrine: no diabetes, does not check CBG's at home    Physical Exam:   BP (!) 109/57  Pulse 68  Temp 97.8 F (36.6 C) (Skin)  Resp 20  Ht 5\' 8"  (1.727 m)  Wt 262 lb (118.8 kg)  SpO2 95% Comment: RA  BMI 39.84 kg/m  General: well-appearing  HEENT: Unremarkable, NCAT, PERLA, EOMI  Neck: no JVD, no bruits, no adenopathy or thyromegaly  Chest: clear to auscultation, symmetrical breath sounds, no wheezes, no rhonchi  CV: RRR, grade III/VI crescendo/decrescendo murmur heard best at RSB, no diastolic murmur  Abdomen: soft, non-tender, no masses  Extremities: warm, well-perfused, pulses palpable in feet, mild LE edema  Rectal/GU Deferred  Neuro: Grossly non-focal and symmetrical throughout  Skin: Clean and dry, no rashes, no breakdown    Diagnostic Tests:   ECHOCARDIOGRAM REPORT  Patient Name: [name] Date of Exam: 03/31/2019  Medical Rec #: [id number] Height: 68.0 in  Accession #: [id number] Weight: 250.0 lb  Date of Birth: 04/20/1956 BSA: 2.25 m  Patient Age: 58 years BP: 109/53 mmHg  Patient Gender: F HR: 70 bpm.  Exam Location: Inpatient  Procedure: 2D Echo and Intracardiac Opacification Agent  Indications: Ascending aortic aneurysm 441.9 / I71   History: Patient has prior history of Echocardiogram examinations, most  recent 12/27/2013. NSTEMI Signs/Symptoms:Chest Pain. Elevated  troponin.  Sonographer: Talmage Coin  Referring Phys: [place] Comments: Technically difficult study due to poor echo windows.  IMPRESSIONS  1. Limited images.  2. Left ventricular ejection fraction, by visual estimation, is 55%. The left ventricle has normal function. There is mildly increased left ventricular hypertrophy.  3. Definity contrast agent was given IV to delineate the left ventricular endocardial borders.  4. Left ventricular diastolic Doppler parameters are consistent with impaired relaxation pattern of LV diastolic filling.  5. Global right ventricle was not well visualized.The right ventricular size is not well visualized. Right vetricular wall thickness was not assessed.  6. Left atrial size was normal.  7. Right atrial size was normal.  8. Mild mitral annular calcification.  9. The mitral valve is grossly normal. No evidence of mitral valve regurgitation.  10. The tricuspid valve is not well visualized. Tricuspid valve regurgitation is trivial.  11. Aortic valve mean gradient measures 71.8 mmHg.  12. The aortic valve was not well visualized but is calcified with restricted motion. Aortic valve regurgitation is mild by color flow Doppler. Severe aortic valve stenosis is suggested by gradients. Suggest TEE to further assess aortic valve structure.  13. The pulmonic valve was not well visualized. Pulmonic valve regurgitation was not assessed by color flow Doppler.  14. Aortic dilatation noted.  15. There is mild dilatation of the ascending aorta.  16. The inferior vena cava is normal in size with greater than 50% respiratory variability, suggesting right atrial pressure of 3 mmHg.  17. The interatrial septum was not well visualized.  18. TR signal is inadequate for assessing pulmonary artery systolic pressure.   FINDINGS  Left Ventricle: Left ventricular ejection fraction, by visual estimation, is 55%. The left ventricle has normal function. Definity contrast agent was given IV to delineate the left ventricular endocardial borders. There is mildly increased left  ventricular hypertrophy. Spectral Doppler shows Left ventricular diastolic Doppler parameters are consistent with impaired relaxation pattern of LV diastolic filling.  Right Ventricle: The right ventricular size is not well visualized. Right vetricular wall thickness was not assessed. Global RV systolic function is was not well visualized.  Left Atrium: Left atrial size was normal in size.  Right Atrium: Right atrial size was normal in size  Pericardium: There is no evidence of pericardial effusion.  Mitral Valve: The mitral valve is grossly normal. Mild mitral annular calcification. No evidence of mitral valve regurgitation.  Tricuspid Valve: The tricuspid valve is not well visualized. Tricuspid valve regurgitation is trivial by color flow Doppler.  Aortic Valve: The aortic valve was not well visualized. Aortic valve regurgitation is mild by color flow Doppler. Severe aortic stenosis is present. Aortic valve mean gradient measures 71.8 mmHg. Aortic valve peak gradient measures 105.0 mmHg.  Pulmonic Valve: The pulmonic valve was not well visualized. Pulmonic valve regurgitation was not assessed by color flow Doppler.  Aorta: Aortic dilatation noted. There is mild dilatation of the ascending aorta.  Venous: The inferior vena cava is normal in size with greater than 50% respiratory variability, suggesting right atrial pressure of 3 mmHg.  IAS/Shunts: The interatrial septum was not well visualized.  Diastology  LV e' lateral: 3.57 cm/s  LV E/e' lateral: 16.6  LV  e' medial: 4.26 cm/s  LV E/e' medial: 13.9  LEFT ATRIUM Index  LA Vol (A4C): 52.3 ml 23.27 ml/m  AORTIC VALVE  AV Vmax: 512.40 cm/s  AV Vmean: 408.600 cm/s  AV VTI: 1.300 m  AV Peak  Grad: 105.0 mmHg  AV Mean Grad: 71.8 mmHg  LVOT Vmax: 73.10 cm/s  LVOT Vmean: 53.900 cm/s  LVOT VTI: 0.186 m  LVOT/AV VTI ratio: 0.14  MITRAL VALVE  MV Area (PHT): 2.26 cm SHUNTS  MV PHT: 97.15 msec Systemic VTI: 0.19 m  MV Decel Time: 335 msec  MV E velocity: 59.10 cm/s 103 cm/s  MV A velocity: 101.00 cm/s 70.3 cm/s  MV E/A ratio: 0.59 1.5  Rozann Lesches MD  Electronically signed by Rozann Lesches MD  Signature Date/Time: 03/31/2019/4:02:01 PM    Physicians  Panel Physicians Referring Physician Case Authorizing Physician  End, Harrell Gave, MD (Primary)    Procedures  RIGHT/LEFT HEART CATH AND CORONARY ANGIOGRAPHY  Conclusion  Conclusions:  1. No angiographically significant CAD. 2. Severe aortic stenosis (mean gradient 65 mmHg, calculated valve area 0.5 cm^2). 3. Moderately to severely elevated left ventricular filling pressure. 4. Mildly elevated right and pulmonary artery pressures. 5. Moderately reduced cardiac output. Recommendations:  1. Medical therapy, including careful blood pressure control and diuresis. 2. Valve team consultation for further workup/management of severe aortic stenosis. Nelva Bush, MD  [place]  Pager: [phone]   Recommendations  Antiplatelet/Anticoag No indication for antiplatelet therapy at this time .  Discharge Date Anticipated discharge date to be determined.  Indications  Unstable angina (HCC) [I20.0 (ICD-10-CM)]  Aortic valve stenosis, etiology of cardiac valve disease unspecified [I35.0 (ICD-10-CM)]  Procedural Details  Technical Details Indication: 63 y.o. year-old woman with history of aortic stenosis, HLD, GERD, wheelchair bound due to hx of polio, depression, and anxiety, presenting for evaluation of chest pain and apparent severe aortic stenosis by echocardiogram (technically difficult study).  GFR: >60 ml/min  Procedure: The risks, benefits, complications, treatment options, and expected outcomes were  discussed with the patient. The patient and/or family concurred with the proposed plan, giving informed consent. The patient was brought to the cath lab after IV hydration was begun and oral premedication was given. The patient was further sedated with Versed and Fentanyl. The right groin was prepped and draped in the usual manner, as the patient did now wish to use radial access due to mobility issues. Ultrasound was used to evaluate the right femoral artery and vein. They were both patent. An ultrasound image was saved in the permanent record. A micropuncture needle was used to access the right femoral artery and vein under ultrasound guidance. Using the modified Seldinger access technique, a 761F and 61F sheaths were placed in the right femoral artery and vein, respectively.  Right heart catheterization was performed by advancing a 61F balloon-tipped catheter through the right heart chambers into the pulmonary capillary wedge position. Pressure measurements and oxygen saturations were obtained. Thermodilution measurements were also performed.  Selective coronary angiography was performed using 53F JL4 and JR4 catheters to engage the left and right coronary arteries, respectively. Left heart catheterization was performed using a 53F JR4 catheter and straight wire to cross the aortic valve. The JR4 catheter was exchanged for a 761F dual lumen pigtail catheter over an exchange-length J-wire. Simultaneous LV and aortic pressures were obtained. Left ventriculogram was not performed.  At the end of the procedure, the right femoral artery and vein sheaths were secured in place, to be removed with manual compression.  There were no immediate complications. The patient was taken to the recovery area in stable condition.  Estimated blood loss <50 mL.   During this procedure medications were administered to achieve and maintain moderate conscious sedation while the patient's heart rate, blood pressure, and oxygen saturation  were continuously monitored and I was present face-to-face 100% of this time.  Medications  (Filter: Administrations occurring from 04/02/19 0750 to 04/02/19 0913)          (important) Continuous medications are totaled by the amount administered until 04/02/19 0913.  Medication Rate/Dose/Volume Action  Date Time   fentaNYL (SUBLIMAZE) injection (mcg) 50 mcg Given 04/02/19 0805   Total dose as of 04/02/19 0913 25 mcg Given 0821   100 mcg 25 mcg Given 0839   midazolam (VERSED) injection (mg) 1 mg Given 04/02/19 0805   Total dose as of 04/02/19 0913 1 mg Given 0821   3 mg 1 mg Given 0839   lidocaine (PF) (XYLOCAINE) 1 % injection (mL) 15 mL Given 04/02/19 0814   Total dose as of 04/02/19 0913        15 mL        Heparin (Porcine) in NaCl 1000-0.9 UT/500ML-% SOLN (mL) 500 mL Given 04/02/19 0821   Total dose as of 04/02/19 0913 500 mL Given 0822   1,000 mL        hydrALAZINE (APRESOLINE) injection (mg) 10 mg Given 04/02/19 0854   Total dose as of 04/02/19 0913        10 mg        iohexol (OMNIPAQUE) 350 MG/ML injection (mL) 55 mL Given 04/02/19 0903   Total dose as of 04/02/19 0913        55 mL        ondansetron (ZOFRAN) injection (mg) 4 mg Given 04/02/19 0902   Total dose as of 04/02/19 0913        4 mg        venlafaxine XR (EFFEXOR-XR) 24 hr capsule 75 mg (mg) *Not included in total Automatically Held 04/02/19 0800   Total dose as of 04/02/19 0913        Cannot be calculated        Sedation Time  Sedation Time Physician-1: 50 minutes 37 seconds  Contrast  Medication Name Total Dose  iohexol (OMNIPAQUE) 350 MG/ML injection 55 mL  Radiation/Fluoro  Fluoro time: 9.2 (min)  DAP: 39.4 (Gycm2)  Cumulative Air Kerma: 123456 (mGy)  Complications  Complications documented before study signed (04/02/2019 [id number] PM)   No complications were associated with this study.  Documented by Nelva Bush, MD - 04/02/2019 1:05 PM  Coronary Findings  Diagnostic  Dominance: Right  Left Main   Vessel is large. Vessel is angiographically normal.  Left Anterior Descending  Vessel is large. Vessel is angiographically normal.  First Diagonal Branch  Vessel is small in size.  Second Diagonal Branch  Vessel is moderate in size.  Third Diagonal Branch  Vessel is small in size.  Left Circumflex  Vessel is large. Vessel is angiographically normal.  First Obtuse Marginal Branch  Vessel is small in size.  Second Obtuse Marginal Branch  Vessel is moderate in size.  Third Obtuse Marginal Branch  Vessel is small in size.  Right Coronary Artery  Vessel is moderate in size. Vessel is angiographically normal.  Right Posterior Descending Artery  Vessel is small in size.  Intervention  No interventions have been documented.  Right Heart  Right Heart Pressures RA (mean): 11  mmHg RV (S/EDP): 48/12 mmHg PA (S/D, mean): 45/25 (32) mmHg PCWP (mean): 25-30 mmHg  Ao sat: 99% PA sat: 61%  Fick CO: 4.2 L/min Fick CI: 1.8 L/min/m^2  Thermodilution CO: 4.6 L/min Thermodilution CI: 2.0 L/min/m^2  Left Heart  Left Ventricle LV end diastolic pressure is severely elevated. LVEDP ~40 mmHg.  Aortic Valve There is severe aortic valve stenosis. The aortic valve is calcified. Mean gradient 65 mmHg. Peak-to-peak gradent: 71 mmHg. Valve area 0.5 cm^2.  Coronary Diagrams  Diagnostic  Dominance: Right   Intervention  Implants     No implant documentation for this case.  Syngo Images  Link to Procedure Log   Show images for CARDIAC CATHETERIZATION Procedure Log  Images on Long Term Storage    Show images for [place], [place]   Hemo Data   Most Recent Value  Fick Cardiac Output 4.22 L/min  Fick Cardiac Output Index 1.84 (L/min)/BSA  Thermal Cardiac Output 4.55 L/min  Thermal Cardiac Output Index 1.98 (L/min)/BSA  Aortic Mean Gradient 64.5 mmHg  Aortic Peak Gradient 71 mmHg  Aortic Valve Area 0.49  Aortic Value Area Index 0.21 cm2/BSA  RA A Wave 10 mmHg  RA V Wave 8 mmHg  RA Mean 7  mmHg  RV Systolic Pressure 42 mmHg  RV Diastolic Pressure 8 mmHg  RV EDP 9 mmHg  PA Systolic Pressure 39 mmHg  PA Diastolic Pressure 21 mmHg  PA Mean 30 mmHg  PW A Wave 30 mmHg  PW V Wave 36 mmHg  PW Mean 28 mmHg  AO Systolic Pressure [id number] mmHg  AO Diastolic Pressure 81 mmHg  AO Mean 123XX123 mmHg  LV Systolic Pressure 123456 mmHg  LV Diastolic Pressure 25 mmHg  LV EDP 38 mmHg  AOp Systolic Pressure [id number] mmHg  AOp Diastolic Pressure 81 mmHg  AOp Mean Pressure 123XX123 mmHg  LVp Systolic Pressure 99991111 mmHg  LVp Diastolic Pressure 26 mmHg  LVp EDP Pressure 38 mmHg  TPVR Index 15.13 HRUI  TSVR Index 61.02 HRUI  PVR SVR Ratio 0.02  TPVR/TSVR Ratio 0.25     ADDENDUM REPORT: 04/03/2019 13:01  CLINICAL DATA: Aortic stenosis  EXAM:  Cardiac TAVR CT  TECHNIQUE:  The patient was scanned on a Siemens Force [id number] slice scanner. A 120  kV retrospective scan was triggered in the descending thoracic aorta  at 111 HU's. Gantry rotation speed was 270 msecs and collimation was  .9 mm. No beta blockade or nitro were given. The 3D data set was  reconstructed in 5% intervals of the R-R cycle. Systolic and  diastolic phases were analyzed on a dedicated work station using  MPR, MIP and VRT modes. The patient received 80 cc of contrast.  FINDINGS:  Aortic Valve: Bicuspid heavily calcified with restricted leaflet  motion  Aorta: Mildly aneurysmal with bovine arc and mild calcific  atherosclerosis  Sinotubular Junction: 28 mm  Ascending Thoracic Aorta: 37 mm  Aortic Arch: 24 mm  Descending Thoracic Aorta: 22 mm  Sinus of Valsalva Measurements:  Non-coronary: 31.7 mm  Right - coronary: 32.2 mm  Left - coronary: 32.9 mm  Coronary Artery Height above Annulus:  Left Main: 14.9 mm above annulus  Right Coronary: 14.3 mm above annulus  Virtual Basal Annulus Measurements:  Maximum/Minimum Diameter: 27.8 mm x 22.8 mm  Perimeter: 83 mm  Area: 517 mm2  Coronary Arteries: Sufficient height above annulus for  deployment  Optimum Fluoroscopic Angle for Delivery: LAO 13 Cranial 3 degrees  IMPRESSION:  1. Bicuspid AV with  annular area of 517 mm2 suitable for a 26 mm  Sapien 3 Ultra valve  2. Coronary arteries sufficient height above annulus for deployment  3. Mild aortic root enlargement 3.7 cm with bovine arch  4. Optimum angiographic angle for deployment LAO 13 Cranial 3  degrees  Jenkins Rouge  Electronically Signed  By: Jenkins Rouge M.D.  On: 04/03/2019 13:01   Addended by Josue Hector, MD on 04/03/2019 1:03 PM  Study Result   EXAM:  OVER-READ INTERPRETATION CT CHEST  The following report is an over-read performed by radiologist Dr.  Rolm Baptise of [facility], [place] on 04/03/2019. This  over-read does not include interpretation of cardiac or coronary  anatomy or pathology. The coronary CTA interpretation by the  cardiologist is attached.  COMPARISON: 03/31/2019  FINDINGS:  Vascular: Heart is normal size. Aorta is normal caliber. No  dissection.  Mediastinum/Nodes: No adenopathy in the lower mediastinum or hila.  Calcified right hilar lymph nodes.  Lungs/Pleura: Tree-in-bud nodular densities noted in the peripheral  right middle lobe, right lower lobe and left lower lobe, similar to  prior study. No effusions.  Upper Abdomen: Imaging into the upper abdomen shows no acute  findings.  Musculoskeletal: Chest wall soft tissues are unremarkable. No acute  bony abnormality.  IMPRESSION:  Clustered tree-in-bud nodular densities in the peripheral right  middle lobe, right lower lobe and left lower lobe, likely related to  infectious or inflammatory process/small airways disease.  No acute extra cardiac abnormality.  Electronically Signed:  By: Rolm Baptise M.D.  On: 04/03/2019 10:04  CLINICAL DATA: Chest/back pain, acute, aortic dissection suspect  EXAM:  CT ANGIOGRAPHY CHEST, ABDOMEN AND PELVIS  TECHNIQUE:  Multidetector CT imaging through the chest, abdomen and pelvis was   performed using the standard protocol during bolus administration of  intravenous contrast. Multiplanar reconstructed images and MIPs were  obtained and reviewed to evaluate the vascular anatomy.  CONTRAST: 172mL OMNIPAQUE IOHEXOL 350 MG/ML SOLN  COMPARISON: Chest radiograph yesterday. Abdominopelvic CT  03/31/2016  FINDINGS:  CTA CHEST FINDINGS  Cardiovascular: Thoracic aorta is normal in caliber. No aortic  dissection, acute aortic syndrome or aneurysm. Conventional  branching pattern from the aortic arch. Aortic valvular  calcifications. No central pulmonary embolus to the lobar level.  Heart is normal in size. No pericardial effusion.  Mediastinum/Nodes: Calcified right hilar nodes consistent with prior  granulomatous disease. No suspicious noncalcified adenopathy.  Subcentimeter left thyroid nodule does not meet size criteria for  further evaluation. Patulous esophagus. Small hiatal hernia.  Lungs/Pleura: Heterogeneous pulmonary parenchyma. 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 Clustered nodules in the subpleural  right middle lobe, series 8, image 104, chronic and unchanged from  2017 abdominal CT. No pulmonary edema. No pleural fluid. The trachea  and mainstem bronchi are patent.  Musculoskeletal: Mild multilevel degenerative change in the spine.  There are no acute or suspicious osseous abnormalities. Degenerative  change in the right shoulder.  Review of the MIP images confirms the above findings.  CTA ABDOMEN AND PELVIS FINDINGS  VASCULAR  Aorta: Normal caliber aorta without aneurysm, dissection, vasculitis  or significant stenosis. Mild atherosclerosis.  Celiac: Patent without evidence of aneurysm, dissection, vasculitis  or significant stenosis.  SMA: Patent without evidence of aneurysm, dissection, vasculitis or  significant stenosis.  Renals: Both renal arteries are patent without evidence  of aneurysm,  dissection, vasculitis, fibromuscular dysplasia or significant  stenosis.  IMA: Patent without evidence of  aneurysm, dissection, vasculitis or  significant stenosis.  Inflow: Patent without evidence of aneurysm, dissection, vasculitis  or significant stenosis.  Veins: Limited assessment on this arterial phase exam.  Review of the MIP images confirms the above findings.  NON-VASCULAR  Hepatobiliary: No focal liver abnormality is seen. Status post  cholecystectomy. No biliary dilatation.  Pancreas: No ductal dilatation or inflammation.  Spleen: Normal in size and arterial enhancement.  Adrenals/Urinary Tract: No adrenal nodule. Suggestion of slight left  renal atrophy. No hydronephrosis. Tiny cortical hypodensities in the  left kidney are too small to characterize. Urinary bladder partially  distended. No bladder wall thickening.  Stomach/Bowel: Small hiatal hernia. Stomach is decompressed and not  well assessed. No small bowel wall thickening or inflammatory  change. Prior appendectomy with surgical clips in the right lower  quadrant. Small volume of stool throughout the colon. Colonic  diverticulosis from the splenic flexure distally, prominent in the  sigmoid. No evidence of diverticulitis.  Lymphatic: No abdominopelvic adenopathy.  Reproductive: Status post hysterectomy. No adnexal masses.  Other: Postsurgical change of the right anterior abdominal wall with  mesh. Fat containing umbilical hernia. No free air or free fluid.  Musculoskeletal: Right hip arthroplasty. Degenerative change in the  spine with multilevel endplate changes. There are no acute or  suspicious osseous abnormalities.  Review of the MIP images confirms the above findings.  IMPRESSION:  1. No aortic dissection or acute aortic abnormality. Mild  thoracoabdominal aortic atherosclerosis. Aortic valvular  calcifications, consider further evaluation with echocardiogram.  2. No acute abnormality in the  chest, abdomen, or pelvis.  3. Incidental colonic diverticulosis without diverticulitis. Small  hiatal hernia.  4. Right abdominal wall hernia repair with mesh. Fat containing  umbilical hernia.  Aortic Atherosclerosis (ICD10-I70.0).  Electronically Signed  By: Keith Rake M.D.  On: 03/31/2019 02:42    STS Risk Calculator:   Procedure: Isolated AVR  Risk of Mortality:  1.117%   Renal Failure:  0.668%   Permanent Stroke:  0.589%   Prolonged Ventilation:  3.726%   DSW Infection:  0.150%   Reoperation:  2.123%   Morbidity or Mortality:  6.480%   Short Length of Stay:  53.647%   Long Length of Stay:  2.254%    Impression:   The patient is a 63 year old woman with stage D, severe, symptomatic aortic stenosis with New York Heart Association class III symptoms of exertional fatigue associated with some shortness of breath and chest discomfort consistent with chronic diastolic congestive heart failure. She had a severe episode of chest pain leading up to her recent admission. I have personally reviewed her 2D echocardiogram, cardiac catheterization, and CTA studies. Her echocardiogram shows a severely calcified aortic valve with a mean gradient of 71.8 mmHg and a peak gradient of 105 mmHg consistent with severe aortic stenosis. There is mild aortic insufficiency. Left ventricular systolic function is normal. Cardiac catheterization shows normal coronary arteries. The mean aortic valve gradient was measured at 65 mmHg with a calculated aortic valve area of 0.5 cm. I agree that aortic valve placement is indicated in this patient. 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 Her gated cardiac CTA shows anatomy suitable for transcatheter aortic valve replacement using a SAPIEN 3 valve. Her abdominal and pelvic CTA shows adequate pelvic vascular anatomy to allow transfemoral insertion.  The patient was counseled at  length regarding treatment alternatives for management of severe symptomatic aortic stenosis. The risks and  benefits of surgical intervention has been discussed in detail. Long-term prognosis with medical therapy was discussed. Alternative approaches such as conventional surgical aortic valve replacement, transcatheter aortic valve replacement, and palliative medical therapy were compared and contrasted at length. This discussion was placed in the context of the patient's own specific clinical presentation and past medical history. All of her questions havebeen addressed.  Following the decision to proceed with transcatheter aortic valve replacement, a discussion was held regarding what types of management strategies would be attempted intraoperatively in the event of life-threatening complications, including whether or not the patient would be considered a candidate for the use of cardiopulmonary bypass and/or conversion to open sternotomy for attempted surgical intervention. She is a low surgical risk patient and only 63 years old so I think she would be a candidate for emergent sternotomy to manage any intraoperative complications although she would have a slow recovery due to her nonambulatory status. The patient has been advised of a variety of complications that might develop including but not limited to risks of death, stroke, paravalvular leak, aortic dissection or other major vascular complications, aortic annulus rupture, device embolization, cardiac rupture or perforation, mitral regurgitation, acute myocardial infarction, arrhythmia, heart block or bradycardia requiring permanent pacemaker placement, congestive heart failure, respiratory failure, renal failure, pneumonia, infection, other late complications related to structural valve deterioration or migration, or other complications that might ultimately cause a temporary or permanent loss of functional independence or other long term morbidity. The  patient provides full informed consent for the procedure as described and all questions were answered.   Plan:   Transfemoral transcatheter aortic valve placement    Gaye Pollack, MD

## 2019-04-17 ENCOUNTER — Encounter (HOSPITAL_COMMUNITY)
Admission: RE | Disposition: A | Discharge status: Home / Self Care | Payer: Self-pay | Source: Home / Self Care | Attending: Cardiovascular Disease

## 2019-04-17 ENCOUNTER — Inpatient Hospital Stay (HOSPITAL_COMMUNITY): Payer: Medicare Other

## 2019-04-17 ENCOUNTER — Encounter (HOSPITAL_COMMUNITY): Payer: Self-pay | Admitting: Orthopedic Surgery

## 2019-04-17 ENCOUNTER — Inpatient Hospital Stay (HOSPITAL_COMMUNITY): Payer: Medicare Other | Admitting: Vascular Surgery

## 2019-04-17 ENCOUNTER — Other Ambulatory Visit: Payer: Self-pay

## 2019-04-17 ENCOUNTER — Other Ambulatory Visit: Payer: Self-pay | Admitting: Physician Assistant

## 2019-04-17 ENCOUNTER — Inpatient Hospital Stay (HOSPITAL_COMMUNITY)
Admission: RE | Admit: 2019-04-17 | Discharge: 2019-04-18 | DRG: 267 | Disposition: A | Discharge status: Home / Self Care | Payer: Medicare Other | Attending: Cardiovascular Disease | Admitting: Cardiovascular Disease

## 2019-04-17 DIAGNOSIS — Z79899 Other long term (current) drug therapy: Secondary | ICD-10-CM

## 2019-04-17 DIAGNOSIS — Z881 Allergy status to other antibiotic agents status: Secondary | ICD-10-CM

## 2019-04-17 DIAGNOSIS — Z993 Dependence on wheelchair: Secondary | ICD-10-CM

## 2019-04-17 DIAGNOSIS — K429 Umbilical hernia without obstruction or gangrene: Secondary | ICD-10-CM | POA: Diagnosis present

## 2019-04-17 DIAGNOSIS — Z20828 Contact with and (suspected) exposure to other viral communicable diseases: Secondary | ICD-10-CM | POA: Diagnosis present

## 2019-04-17 DIAGNOSIS — E785 Hyperlipidemia, unspecified: Secondary | ICD-10-CM | POA: Diagnosis present

## 2019-04-17 DIAGNOSIS — G8314 Monoplegia of lower limb affecting left nondominant side: Secondary | ICD-10-CM | POA: Diagnosis present

## 2019-04-17 DIAGNOSIS — I350 Nonrheumatic aortic (valve) stenosis: Secondary | ICD-10-CM

## 2019-04-17 DIAGNOSIS — Z9989 Dependence on other enabling machines and devices: Secondary | ICD-10-CM

## 2019-04-17 DIAGNOSIS — I5032 Chronic diastolic (congestive) heart failure: Secondary | ICD-10-CM | POA: Diagnosis present

## 2019-04-17 DIAGNOSIS — Z952 Presence of prosthetic heart valve: Secondary | ICD-10-CM

## 2019-04-17 DIAGNOSIS — E669 Obesity, unspecified: Secondary | ICD-10-CM | POA: Diagnosis present

## 2019-04-17 DIAGNOSIS — I5033 Acute on chronic diastolic (congestive) heart failure: Secondary | ICD-10-CM | POA: Diagnosis present

## 2019-04-17 DIAGNOSIS — K219 Gastro-esophageal reflux disease without esophagitis: Secondary | ICD-10-CM | POA: Diagnosis present

## 2019-04-17 DIAGNOSIS — Z8249 Family history of ischemic heart disease and other diseases of the circulatory system: Secondary | ICD-10-CM | POA: Diagnosis not present

## 2019-04-17 DIAGNOSIS — G14 Postpolio syndrome: Secondary | ICD-10-CM

## 2019-04-17 DIAGNOSIS — Z981 Arthrodesis status: Secondary | ICD-10-CM

## 2019-04-17 DIAGNOSIS — Z006 Encounter for examination for normal comparison and control in clinical research program: Secondary | ICD-10-CM

## 2019-04-17 DIAGNOSIS — I700 Atherosclerosis of aorta: Secondary | ICD-10-CM | POA: Diagnosis present

## 2019-04-17 DIAGNOSIS — K449 Diaphragmatic hernia without obstruction or gangrene: Secondary | ICD-10-CM | POA: Diagnosis present

## 2019-04-17 DIAGNOSIS — Z809 Family history of malignant neoplasm, unspecified: Secondary | ICD-10-CM

## 2019-04-17 DIAGNOSIS — F418 Other specified anxiety disorders: Secondary | ICD-10-CM | POA: Diagnosis present

## 2019-04-17 DIAGNOSIS — I214 Non-ST elevation (NSTEMI) myocardial infarction: Secondary | ICD-10-CM | POA: Diagnosis present

## 2019-04-17 DIAGNOSIS — Z9049 Acquired absence of other specified parts of digestive tract: Secondary | ICD-10-CM

## 2019-04-17 DIAGNOSIS — G4733 Obstructive sleep apnea (adult) (pediatric): Secondary | ICD-10-CM | POA: Diagnosis present

## 2019-04-17 HISTORY — PX: TRANSCATHETER AORTIC VALVE REPLACEMENT, TRANSFEMORAL: SHX6400

## 2019-04-17 HISTORY — PX: TEE WITHOUT CARDIOVERSION: SHX5443

## 2019-04-17 HISTORY — DX: Presence of prosthetic heart valve: Z95.2

## 2019-04-17 LAB — POCT ACTIVATED CLOTTING TIME
Activated Clotting Time: 138 seconds
Activated Clotting Time: 290 seconds
Activated Clotting Time: 125 seconds

## 2019-04-17 LAB — POCT I-STAT, CHEM 8
BUN: 13 mg/dL (ref 8–23)
BUN: 13 mg/dL (ref 8–23)
BUN: 13 mg/dL (ref 8–23)
Calcium, Ion: 1.2 mmol/L (ref 1.15–1.40)
Calcium, Ion: 1.2 mmol/L (ref 1.15–1.40)
Calcium, Ion: 1.17 mmol/L (ref 1.15–1.40)
Chloride: 105 mmol/L (ref 98–111)
Chloride: 104 mmol/L (ref 98–111)
Chloride: 105 mmol/L (ref 98–111)
Creatinine, Ser: 0.6 mg/dL (ref 0.44–1.00)
Creatinine, Ser: 0.5 mg/dL (ref 0.44–1.00)
Creatinine, Ser: 0.5 mg/dL (ref 0.44–1.00)
Glucose, Bld: 105 mg/dL — ABNORMAL HIGH (ref 70–99)
Glucose, Bld: 129 mg/dL — ABNORMAL HIGH (ref 70–99)
Glucose, Bld: 129 mg/dL — ABNORMAL HIGH (ref 70–99)
HCT: 37 % (ref 36.0–46.0)
HCT: 36 % (ref 36.0–46.0)
HCT: 35 % — ABNORMAL LOW (ref 36.0–46.0)
Hemoglobin: 12.6 g/dL (ref 12.0–15.0)
Hemoglobin: 12.2 g/dL (ref 12.0–15.0)
Hemoglobin: 11.9 g/dL — ABNORMAL LOW (ref 12.0–15.0)
Potassium: 4.1 mmol/L (ref 3.5–5.1)
Potassium: 4.3 mmol/L (ref 3.5–5.1)
Potassium: 4 mmol/L (ref 3.5–5.1)
Sodium: 141 mmol/L (ref 135–145)
Sodium: 140 mmol/L (ref 135–145)
Sodium: 140 mmol/L (ref 135–145)
TCO2: 25 mmol/L (ref 22–32)
TCO2: 24 mmol/L (ref 22–32)
TCO2: 22 mmol/L (ref 22–32)

## 2019-04-17 SURGERY — IMPLANTATION, AORTIC VALVE, TRANSCATHETER, FEMORAL APPROACH
Anesthesia: Monitor Anesthesia Care

## 2019-04-17 MED ORDER — LACTATED RINGERS IV SOLN
INTRAVENOUS | Status: DC | PRN
  Administered 2019-04-17: 12:00:00 via INTRAVENOUS

## 2019-04-17 MED ORDER — IOHEXOL 350 MG/ML SOLN
INTRAVENOUS | Status: DC | PRN
  Administered 2019-04-17: 60 mL

## 2019-04-17 MED ORDER — HEPARIN (PORCINE) IN NACL 1000-0.9 UT/500ML-% IV SOLN
INTRAVENOUS | Status: AC
  Filled 2019-04-17: qty 500

## 2019-04-17 MED ORDER — CLOPIDOGREL BISULFATE 75 MG PO TABS
75.0000 mg | ORAL_TABLET | Freq: Every day | ORAL | Status: DC
  Administered 2019-04-18: 75 mg via ORAL
  Filled 2019-04-17: qty 1

## 2019-04-17 MED ORDER — SODIUM CHLORIDE 0.9 % IV SOLN
INTRAVENOUS | Status: DC
  Administered 2019-04-17: 50 mL/h via INTRAVENOUS

## 2019-04-17 MED ORDER — TRAZODONE HCL 100 MG PO TABS
100.0000 mg | ORAL_TABLET | Freq: Every day | ORAL | Status: DC
  Administered 2019-04-17: 100 mg via ORAL
  Filled 2019-04-17: qty 1

## 2019-04-17 MED ORDER — ONDANSETRON HCL 4 MG/2ML IJ SOLN
INTRAMUSCULAR | Status: DC | PRN
  Administered 2019-04-17: 4 mg via INTRAVENOUS

## 2019-04-17 MED ORDER — ACETAMINOPHEN 325 MG PO TABS
650.0000 mg | ORAL_TABLET | Freq: Four times a day (QID) | ORAL | Status: DC | PRN
  Administered 2019-04-18: 650 mg via ORAL
  Filled 2019-04-17: qty 2

## 2019-04-17 MED ORDER — ONDANSETRON HCL 4 MG/2ML IJ SOLN
4.0000 mg | Freq: Four times a day (QID) | INTRAMUSCULAR | Status: DC | PRN
  Filled 2019-04-17: qty 2

## 2019-04-17 MED ORDER — SODIUM CHLORIDE 0.9% FLUSH: 3.0000 mL | INTRAVENOUS | Status: DC | PRN

## 2019-04-17 MED ORDER — ASPIRIN 81 MG PO CHEW
81.0000 mg | CHEWABLE_TABLET | Freq: Every day | ORAL | Status: DC
  Administered 2019-04-18: 81 mg via ORAL
  Filled 2019-04-17: qty 1

## 2019-04-17 MED ORDER — MORPHINE SULFATE (PF) 10 MG/ML IV SOLN: 1.0000 mg | INTRAVENOUS | Status: DC | PRN

## 2019-04-17 MED ORDER — LIDOCAINE HCL (PF) 1 % IJ SOLN
INTRAMUSCULAR | Status: DC | PRN
  Administered 2019-04-17: 20 mL

## 2019-04-17 MED ORDER — NITROGLYCERIN IN D5W 200-5 MCG/ML-% IV SOLN: 0.0000 ug/min | INTRAVENOUS | Status: DC

## 2019-04-17 MED ORDER — MORPHINE SULFATE (PF) 2 MG/ML IV SOLN: 1.0000 mg | INTRAVENOUS | Status: DC | PRN

## 2019-04-17 MED ORDER — PROPOFOL 10 MG/ML IV BOLUS
INTRAVENOUS | Status: DC | PRN
  Administered 2019-04-17 (×2): 10 mg via INTRAVENOUS

## 2019-04-17 MED ORDER — PHENYLEPHRINE HCL-NACL 20-0.9 MG/250ML-% IV SOLN
0.0000 ug/min | INTRAVENOUS | Status: DC
  Filled 2019-04-17: qty 250

## 2019-04-17 MED ORDER — HEPARIN SODIUM (PORCINE) 1000 UNIT/ML IJ SOLN
INTRAMUSCULAR | Status: DC | PRN
  Administered 2019-04-17: 17000 [IU] via INTRAVENOUS

## 2019-04-17 MED ORDER — GABAPENTIN 300 MG PO CAPS
300.0000 mg | ORAL_CAPSULE | Freq: Every day | ORAL | Status: DC
  Administered 2019-04-17 – 2019-04-18 (×2): 300 mg via ORAL
  Filled 2019-04-17 (×2): qty 1

## 2019-04-17 MED ORDER — PROTAMINE SULFATE 10 MG/ML IV SOLN
INTRAVENOUS | Status: DC | PRN
  Administered 2019-04-17: 170 mg via INTRAVENOUS

## 2019-04-17 MED ORDER — LIDOCAINE HCL (PF) 1 % IJ SOLN
INTRAMUSCULAR | Status: AC
  Filled 2019-04-17: qty 30

## 2019-04-17 MED ORDER — VENLAFAXINE HCL ER 75 MG PO CP24
75.0000 mg | ORAL_CAPSULE | Freq: Every day | ORAL | Status: DC
  Administered 2019-04-18: 75 mg via ORAL
  Filled 2019-04-17: qty 1

## 2019-04-17 MED ORDER — PROTAMINE SULFATE 10 MG/ML IV SOLN
INTRAVENOUS | Status: AC
  Filled 2019-04-17: qty 5

## 2019-04-17 MED ORDER — HEPARIN (PORCINE) IN NACL 1000-0.9 UT/500ML-% IV SOLN
INTRAVENOUS | Status: DC | PRN
  Administered 2019-04-17 (×3): 500 mL

## 2019-04-17 MED ORDER — CHLORHEXIDINE GLUCONATE 4 % EX LIQD
30.0000 mL | CUTANEOUS | Status: DC
  Filled 2019-04-17: qty 30

## 2019-04-17 MED ORDER — PANTOPRAZOLE SODIUM 40 MG PO TBEC
40.0000 mg | DELAYED_RELEASE_TABLET | Freq: Every day | ORAL | Status: DC
  Administered 2019-04-17 – 2019-04-18 (×2): 40 mg via ORAL
  Filled 2019-04-17 (×2): qty 1

## 2019-04-17 MED ORDER — SODIUM CHLORIDE 0.9% FLUSH
3.0000 mL | Freq: Two times a day (BID) | INTRAVENOUS | Status: DC
  Administered 2019-04-18 (×2): 3 mL via INTRAVENOUS

## 2019-04-17 MED ORDER — LEVOFLOXACIN IN D5W 750 MG/150ML IV SOLN
750.0000 mg | INTRAVENOUS | Status: AC
  Administered 2019-04-18: 750 mg via INTRAVENOUS
  Filled 2019-04-17: qty 150

## 2019-04-17 MED ORDER — VANCOMYCIN HCL IN DEXTROSE 1-5 GM/200ML-% IV SOLN
1000.0000 mg | Freq: Once | INTRAVENOUS | Status: AC
  Administered 2019-04-18: 1000 mg via INTRAVENOUS
  Filled 2019-04-17: qty 200

## 2019-04-17 MED ORDER — PROTAMINE SULFATE 10 MG/ML IV SOLN
INTRAVENOUS | Status: AC
  Filled 2019-04-17: qty 10

## 2019-04-17 MED ORDER — CHLORHEXIDINE GLUCONATE 0.12 % MT SOLN
15.0000 mL | Freq: Once | OROMUCOSAL | Status: AC
  Administered 2019-04-17: 15 mL via OROMUCOSAL
  Filled 2019-04-17: qty 15

## 2019-04-17 MED ORDER — ALPRAZOLAM 0.5 MG PO TABS
0.5000 mg | ORAL_TABLET | Freq: Two times a day (BID) | ORAL | Status: DC | PRN
  Administered 2019-04-18: 0.5 mg via ORAL
  Filled 2019-04-17: qty 1

## 2019-04-17 MED ORDER — DOCUSATE SODIUM 100 MG PO CAPS
100.0000 mg | ORAL_CAPSULE | Freq: Every day | ORAL | Status: DC
  Filled 2019-04-17: qty 1

## 2019-04-17 MED ORDER — CHLORHEXIDINE GLUCONATE 4 % EX LIQD
60.0000 mL | Freq: Once | CUTANEOUS | Status: DC
  Filled 2019-04-17: qty 60

## 2019-04-17 MED ORDER — ACETAMINOPHEN 650 MG RE SUPP: 650.0000 mg | Freq: Four times a day (QID) | RECTAL | Status: DC | PRN

## 2019-04-17 MED ORDER — MIDAZOLAM HCL 5 MG/5ML IJ SOLN
INTRAMUSCULAR | Status: AC
  Filled 2019-04-17: qty 5

## 2019-04-17 MED ORDER — FENTANYL CITRATE (PF) 100 MCG/2ML IJ SOLN
INTRAMUSCULAR | Status: AC
  Filled 2019-04-17: qty 2

## 2019-04-17 MED ORDER — SODIUM CHLORIDE 0.9 % IV SOLN
INTRAVENOUS | Status: DC
  Administered 2019-04-17: 11:00:00 via INTRAVENOUS

## 2019-04-17 MED ORDER — TRAMADOL HCL 50 MG PO TABS: 50.0000 mg | ORAL_TABLET | ORAL | Status: DC | PRN

## 2019-04-17 MED ORDER — SODIUM CHLORIDE 0.9 % IV SOLN: 250.0000 mL | INTRAVENOUS | Status: DC | PRN

## 2019-04-17 MED ORDER — FENTANYL CITRATE (PF) 100 MCG/2ML IJ SOLN
INTRAMUSCULAR | Status: DC | PRN
  Administered 2019-04-17 (×2): 25 ug via INTRAVENOUS

## 2019-04-17 MED ORDER — PERFLUTREN LIPID MICROSPHERE
INTRAVENOUS | Status: AC
  Filled 2019-04-17: qty 10

## 2019-04-17 MED ORDER — NITROGLYCERIN 0.2 MG/ML ON CALL CATH LAB
INTRAVENOUS | Status: DC | PRN
  Administered 2019-04-17 (×2): 20 ug via INTRAVENOUS

## 2019-04-17 MED ORDER — DEXMEDETOMIDINE HCL 200 MCG/2ML IV SOLN
INTRAVENOUS | Status: DC | PRN
  Administered 2019-04-17: 118.8 ug via INTRAVENOUS

## 2019-04-17 MED ORDER — OXYCODONE HCL 5 MG PO TABS: 5.0000 mg | ORAL_TABLET | ORAL | Status: DC | PRN

## 2019-04-17 MED ORDER — MIDAZOLAM HCL 5 MG/5ML IJ SOLN
INTRAMUSCULAR | Status: DC | PRN
  Administered 2019-04-17 (×3): 1 mg via INTRAVENOUS

## 2019-04-17 SURGICAL SUPPLY — 32 items
BAG SNAP BAND KOVER 36X36 (MISCELLANEOUS) ×4 IMPLANT
BLANKET WARM UNDERBOD FULL ACC (MISCELLANEOUS) ×2 IMPLANT
CABLE ADAPT PACING TEMP 12FT (ADAPTER) ×1 IMPLANT
CATH 26 ULTRA DELIVERY (CATHETERS) ×1 IMPLANT
CATH DIAG 6FR PIGTAIL ANGLED (CATHETERS) ×2 IMPLANT
CATH INFINITI 6F AL1 (CATHETERS) ×1 IMPLANT
CATH S G BIP PACING (CATHETERS) ×1 IMPLANT
CLOSURE MYNX CONTROL 6F/7F (Vascular Products) ×1 IMPLANT
CRIMPER (MISCELLANEOUS) ×1 IMPLANT
DEVICE CLOSURE PERCLS PRGLD 6F (VASCULAR PRODUCTS) IMPLANT
DEVICE INFLATION ATRION QL2530 (MISCELLANEOUS) ×2 IMPLANT
ELECT DEFIB PAD ADLT CADENCE (PAD) ×1 IMPLANT
GUIDEWIRE SAFE TJ AMPLATZ EXST (WIRE) ×1 IMPLANT
HOVERMATT SINGLE USE (MISCELLANEOUS) ×1 IMPLANT
KIT HEART LEFT (KITS) ×2 IMPLANT
KIT MICROPUNCTURE NIT STIFF (SHEATH) ×1 IMPLANT
PACK CARDIAC CATHETERIZATION (CUSTOM PROCEDURE TRAY) ×2 IMPLANT
PERCLOSE PROGLIDE 6F (VASCULAR PRODUCTS) ×4
SHEATH 14X36 EDWARDS (SHEATH) ×1 IMPLANT
SHEATH BRITE TIP 7FR 35CM (SHEATH) ×1 IMPLANT
SHEATH PINNACLE 6F 10CM (SHEATH) ×1 IMPLANT
SHEATH PINNACLE 8F 10CM (SHEATH) ×1 IMPLANT
SHIELD RADPAD SCOOP 12X17 (MISCELLANEOUS) ×1 IMPLANT
STOPCOCK MORSE 400PSI 3WAY (MISCELLANEOUS) ×5 IMPLANT
SYR MEDRAD MARK V 150ML (SYRINGE) ×1 IMPLANT
TRANSDUCER W/STOPCOCK (MISCELLANEOUS) ×4 IMPLANT
TUBE CONN 8.8X1320 FR HP M-F (CONNECTOR) ×1 IMPLANT
VALVE 26 ULTRA SAPIEN KIT (Valve) ×1 IMPLANT
WIRE AMPLATZ SS-J .035X180CM (WIRE) ×1 IMPLANT
WIRE EMERALD 3MM-J .035X150CM (WIRE) ×1 IMPLANT
WIRE EMERALD 3MM-J .035X260CM (WIRE) ×1 IMPLANT
WIRE EMERALD ST .035X260CM (WIRE) ×1 IMPLANT

## 2019-04-17 NOTE — Transfer of Care (Signed)
Immediate Anesthesia Transfer of Care Note  Patient: [name]  Procedure(s) Performed: TRANSCATHETER AORTIC VALVE REPLACEMENT, TRANSFEMORAL (N/A ) TRANSESOPHAGEAL ECHOCARDIOGRAM (TEE) (N/A )  Patient Location: Cath Lab  Anesthesia Type:MAC  Level of Consciousness: drowsy  Airway & Oxygen Therapy: Patient Spontanous Breathing and Patient connected to nasal cannula oxygen  Post-op Assessment: Report given to RN and Post -op Vital signs reviewed and stable  Post vital signs: Reviewed and stable  Last Vitals:  Vitals Value Taken Time  BP 122/48 04/17/19 1610  Temp    Pulse 57 04/17/19 1612  Resp 17 04/17/19 1612  SpO2 94 % 04/17/19 1612  Vitals shown include unvalidated device data.  Last Pain:  Vitals:   04/17/19 1603  TempSrc:   PainSc: 0-No pain         Complications: No apparent anesthesia complications

## 2019-04-17 NOTE — Discharge Instructions (Signed)
ACTIVITY AND EXERCISE °• Daily activity and exercise are an important part of your recovery. People recover at different rates depending on their general health and type of valve procedure. °• Most people recovering from TAVR feel better relatively quickly  °• No lifting, pushing, pulling more than 10 pounds (examples to avoid: groceries, vacuuming, gardening, golfing): °            - For one week with a procedure through the groin. °            - For six weeks for procedures through the chest wall or neck. °NOTE: You will typically see one of our providers 7-14 days after your procedure to discuss WHEN TO RESUME the above activities.  °  °  °DRIVING °• Do not drive until you are seen for follow up and cleared by a provider. Generally, we ask patient to not drive for 1 week after their procedure. °• If you have been told by your doctor in the past that you may not drive, you must talk with him/her before you begin driving again. °  °DRESSING °• Groin site: you may leave the clear dressing over the site for up to one week or until it falls off. °  °HYGIENE °• If you had a femoral (leg) procedure, you may take a shower when you return home. After the shower, pat the site dry. Do NOT use powder, oils or lotions in your groin area until the site has completely healed. °• If you had a chest procedure, you may shower when you return home unless specifically instructed not to by your discharging practitioner. °            - DO NOT scrub incision; pat dry with a towel. °            - DO NOT apply any lotions, oils, powders to the incision. °            - No tub baths / swimming for at least 2 weeks. °• If you notice any fevers, chills, increased pain, swelling, bleeding or pus, please contact your doctor. °  °ADDITIONAL INFORMATION °• If you are going to have an upcoming dental procedure, please contact our office as you will require antibiotics ahead of time to prevent infection on your heart valve.  ° ° °If you have any  questions or concerns you can call the structural heart phone during normal business hours 8am-4pm. If you have an urgent need after hours or weekends please call [phone] to talk to the on call provider for general cardiology. If you have an emergency that requires immediate attention, please call 911.  ° ° °After TAVR Checklist ° °Check  Test Description  ° Follow up appointment in 1-2 weeks  You will see our structural heart physician assistant, Katie Delorse Shane. Your incision sites will be checked and you will be cleared to drive and resume all normal activities if you are doing well.    ° 1 month echo and follow up  You will have an echo to check on your new heart valve and be seen back in the office by Katie Sakina Briones. Many times the echo is not read by your appointment time, but Katie will call you later that day or the following day to report your results.  ° Follow up with your primary cardiologist You will need to be seen by your primary cardiologist in the following 3-6 months after your 1 month appointment in the valve   clinic. Often times your Plavix or Aspirin will be discontinued during this time, but this is decided on a case by case basis.   ° 1 year echo and follow up You will have another echo to check on your heart valve after 1 year and be seen back in the office by Katie Ivanell Deshotel. This your last structural heart visit.  ° Bacterial endocarditis prophylaxis  You will have to take antibiotics for the rest of your life before all dental procedures (even teeth cleanings) to protect your heart valve. Antibiotics are also required before some surgeries. Please check with your cardiologist before scheduling any surgeries. Also, please make sure to tell us if you have a penicillin allergy as you will require an alternative antibiotic.   ° ° °

## 2019-04-17 NOTE — Anesthesia Preprocedure Evaluation (Addendum)
Anesthesia Evaluation  Patient identified by MRN, date of birth, ID band Patient awake    Reviewed: Allergy & Precautions, NPO status , Patient's Chart, lab work & pertinent test results  History of Anesthesia Complications (+) PONV  Airway Mallampati: III  TM Distance: >3 FB Neck ROM: Full    Dental no notable dental hx.    Pulmonary sleep apnea and Continuous Positive Airway Pressure Ventilation ,    Pulmonary exam normal breath sounds clear to auscultation       Cardiovascular + Valvular Problems/Murmurs AS  Rhythm:Regular Rate:Normal + Systolic murmurs She had a repeat 2D echocardiogram that showed severe aortic stenosis with a mean gradient of 71.8 mmHg with a peak gradient of 105 mmHg. Dimensionless index was 0.14. Cardiac catheterization was performed on 04/02/2019 and showed normal coronary arteries with severe aortic stenosis. The mean gradient was measured at 65 mmHg. There was moderate to severely elevated left ventricular filling pressure and mildly elevated right sided heart pressures.   Neuro/Psych Post-polio syndrome LLE paralysis,  Wheelchair bound  Neuromuscular disease negative psych ROS   GI/Hepatic negative GI ROS, Neg liver ROS,   Endo/Other  Morbid obesity  Renal/GU negative Renal ROS  negative genitourinary   Musculoskeletal negative musculoskeletal ROS (+)   Abdominal   Peds negative pediatric ROS (+)  Hematology negative hematology ROS (+)   Anesthesia Other Findings   Reproductive/Obstetrics negative OB ROS                            Anesthesia Physical Anesthesia Plan  ASA: IV  Anesthesia Plan: MAC   Post-op Pain Management:    Induction: Intravenous  PONV Risk Score and Plan: 3 and Ondansetron, Dexamethasone and Treatment may vary due to age or medical condition  Airway Management Planned: Simple Face Mask  Additional Equipment:   Intra-op Plan:    Post-operative Plan:   Informed Consent: I have reviewed the patients History and Physical, chart, labs and discussed the procedure including the risks, benefits and alternatives for the proposed anesthesia with the patient or authorized representative who has indicated his/her understanding and acceptance.     Dental advisory given  Plan Discussed with: CRNA and Surgeon  Anesthesia Plan Comments:        Anesthesia Quick Evaluation

## 2019-04-17 NOTE — Op Note (Signed)
[facility]   MULTIDISCIPLINARY HEART VALVE TEAM   TAVR OPERATIVE NOTE   Date of Procedure:  04/17/2019  Preoperative Diagnosis: Severe Aortic Stenosis   Postoperative Diagnosis: Same   Procedure:    Transcatheter Aortic Valve Replacement - Percutaneous Transfemoral Approach  Edwards Sapien 3 Ultra THV (size 26 mm, model # 9750TFX, serial # [id number])   Co-Surgeons:  Gaye Pollack, MD and Sherren Mocha, MD  Anesthesiologist:  Myrtie Soman, MD  Echocardiographer:  Ena Dawley, MD  Pre-operative Echo Findings:  Severe aortic stenosis  Normal left ventricular systolic function  Post-operative Echo Findings:  No paravalvular leak  Normal/unchanged left ventricular systolic function  BRIEF CLINICAL NOTE AND INDICATIONS FOR SURGERY  63 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  She has developed severe, stage D1 symptomatic aortic stenosis with recent hospital admission for unstable angina and acute on chronic diastolic heart failure.  She was found to have critical aortic stenosis with a mean transvalvular gradient by cardiac catheterization of 65 mmHg and a mean transvalvular gradient by echo of 72 mmHg with a dimensionless index of 0.14.  She underwent multidisciplinary heart team evaluation and was felt to be an appropriate candidate for TAVR via a transfemoral approach.  During the course of the patient's preoperative work up they have been evaluated comprehensively by a multidisciplinary team of specialists coordinated through the [facility] in the [facility].  They have been demonstrated to suffer from symptomatic severe aortic stenosis as noted above. The patient has been counseled extensively as to the relative risks and benefits of all options for the treatment of severe aortic stenosis including long term medical therapy, conventional surgery for aortic  valve replacement, and transcatheter aortic valve replacement.  The patient has been independently evaluated in formal cardiac surgical consultation by Dr Cyndia Bent, who deemed the patient appropriate for TAVR. Based upon review of all of the patient's preoperative diagnostic tests they are felt to be candidate for transcatheter aortic valve replacement using the transfemoral approach as an alternative to conventional surgery.    Following the decision to proceed with transcatheter aortic valve replacement, a discussion has been held regarding what types of management strategies would be attempted intraoperatively in the event of life-threatening complications, including whether or not the patient would be considered a candidate for the use of cardiopulmonary bypass and/or conversion to open sternotomy for attempted surgical intervention.  The patient has been advised of a variety of complications that might develop peculiar to this approach including but not limited to risks of death, stroke, paravalvular leak, aortic dissection or other major vascular complications, aortic annulus rupture, device embolization, cardiac rupture or perforation, acute myocardial infarction, arrhythmia, heart block or bradycardia requiring permanent pacemaker placement, congestive heart failure, respiratory failure, renal failure, pneumonia, infection, other late complications related to structural valve deterioration or migration, or other complications that might ultimately cause a temporary or permanent loss of functional independence or other long term morbidity.  The patient provides full informed consent for the procedure as described and all questions were answered preoperatively.  DETAILS OF THE OPERATIVE PROCEDURE  PREPARATION:   The patient is brought to the operating room on the above mentioned date and central monitoring was established by the anesthesia team including placement of a central venous catheter and radial  arterial line. The patient is placed in the supine position on the operating table.  Intravenous antibiotics are administered. The patient is  monitored closely throughout the procedure under conscious sedation.  Baseline transthoracic echocardiogram is performed. The patient's chest, abdomen, both groins, and both lower extremities are prepared and draped in a sterile manner. A time out procedure is performed.   PERIPHERAL ACCESS:   Using ultrasound guidance, femoral arterial and venous access is obtained with placement of 6 Fr sheaths on the left side.  A pigtail diagnostic catheter was passed through the femoral arterial sheath under fluoroscopic guidance into the aortic root.  A temporary transvenous pacemaker catheter was passed through the femoral venous sheath under fluoroscopic guidance into the right ventricle.  The pacemaker was tested to ensure stable lead placement and pacemaker capture. Aortic root angiography was performed in order to determine the optimal angiographic angle for valve deployment.  TRANSFEMORAL ACCESS:  A micropuncture technique is used to access the right femoral artery under fluoroscopic and ultrasound guidance.  2 Perclose devices are deployed at 10' and 2' positions to 'PreClose' the femoral artery. An 8 French sheath is placed and then an Amplatz Superstiff wire is advanced through the sheath. This is changed out for a 14 French transfemoral E-Sheath after progressively dilating over the Superstiff wire.  An AL-1 catheter was used to direct a straight-tip exchange length wire across the native aortic valve into the left ventricle. This was exchanged out for a pigtail catheter and position was confirmed in the LV apex. Simultaneous LV and Ao pressures were recorded.  The pigtail catheter was exchanged for an Amplatz Extra-stiff wire in the LV apex.    BALLOON AORTIC VALVULOPLASTY:  Performed with a 23 mm valvuloplasty balloon under rapid ventricular pacing.  The patient  recovers well from a hemodynamic perspective.  TRANSCATHETER HEART VALVE DEPLOYMENT:  An Edwards Sapien 3 transcatheter heart valve (size 26 mm) was prepared and crimped per manufacturer's guidelines, and the proper orientation of the valve is confirmed on the [employer] delivery system. The valve was advanced through the introducer sheath using normal technique until in an appropriate position in the abdominal aorta beyond the sheath tip. The balloon was then retracted and using the fine-tuning wheel was centered on the valve. The valve was then advanced across the aortic arch using appropriate flexion of the catheter. The valve was carefully positioned across the aortic valve annulus. The Commander catheter was retracted using normal technique. Once final position of the valve has been confirmed by angiographic assessment, the valve is deployed while temporarily holding ventilation and during rapid ventricular pacing to maintain systolic blood pressure < 50 mmHg and pulse pressure < 10 mmHg. The balloon inflation is held for >3 seconds after reaching full deployment volume. Once the balloon has fully deflated the balloon is retracted into the ascending aorta and valve function is assessed using echocardiography. The patient's hemodynamic recovery following valve deployment is good.  The deployment balloon and guidewire are both removed. Echo demostrated acceptable post-procedural gradients, stable mitral valve function, and no aortic insufficiency.    PROCEDURE COMPLETION:  The sheath was removed and femoral artery closure is performed using the 2 previously deployed Perclose devices.  Protamine is administered once femoral arterial repair was complete. The site is clear with no evidence of bleeding or hematoma after the sutures are tightened. The temporary pacemaker and pigtail catheters are removed. Mynx closure is used for left femoral arterial hemostasis.  The venous sheath was removed with  manual pressure used for hemostasis. The patient tolerated the procedure well and is transported to the surgical intensive care in stable condition.  There were no immediate intraoperative complications. All sponge instrument and needle counts are verified correct at completion of the operation.   The patient received a total of 60 mL of intravenous contrast during the procedure.   Sherren Mocha, MD 04/17/2019 3:48 PM

## 2019-04-17 NOTE — Progress Notes (Signed)
  Echocardiogram 2D Echocardiogram has been performed.  [name] 04/17/2019, 3:43 PM

## 2019-04-17 NOTE — Anesthesia Postprocedure Evaluation (Signed)
Anesthesia Post Note  Patient: [name]  Procedure(s) Performed: TRANSCATHETER AORTIC VALVE REPLACEMENT, TRANSFEMORAL (N/A ) TRANSESOPHAGEAL ECHOCARDIOGRAM (TEE) (N/A )     Patient location during evaluation: PACU Anesthesia Type: MAC Level of consciousness: awake and alert Pain management: pain level controlled Vital Signs Assessment: post-procedure vital signs reviewed and stable Respiratory status: spontaneous breathing, nonlabored ventilation, respiratory function stable and patient connected to nasal cannula oxygen Cardiovascular status: stable and blood pressure returned to baseline Postop Assessment: no apparent nausea or vomiting Anesthetic complications: no    Last Vitals:  Vitals:   04/17/19 1550 04/17/19 1610  BP:  (!) 122/48  Pulse: (!) 59 (!) 57  Resp:  18  Temp:  36.6 C  SpO2:  95%    Last Pain:  Vitals:   04/17/19 1603  TempSrc:   PainSc: 0-No pain                 Desman Polak S

## 2019-04-17 NOTE — Anesthesia Procedure Notes (Signed)
Arterial Line Insertion Start/End11/08/2018 12:20 PM Performed by: Candis Shine, CRNA, CRNA  Patient location: Pre-op. Preanesthetic checklist: patient identified, IV checked, site marked, risks and benefits discussed, surgical consent, monitors and equipment checked, pre-op evaluation, timeout performed and anesthesia consent Lidocaine 1% used for infiltration Right, radial was placed Catheter size: 20 G Hand hygiene performed  and maximum sterile barriers used   Attempts: 1 Procedure performed without using ultrasound guided technique. Following insertion, dressing applied and Biopatch. Post procedure assessment: normal and unchanged  Patient tolerated the procedure well with no immediate complications.

## 2019-04-18 ENCOUNTER — Encounter (HOSPITAL_COMMUNITY): Payer: Self-pay | Admitting: Cardiovascular Disease

## 2019-04-18 ENCOUNTER — Inpatient Hospital Stay (HOSPITAL_COMMUNITY): Payer: Medicare Other

## 2019-04-18 DIAGNOSIS — Z952 Presence of prosthetic heart valve: Secondary | ICD-10-CM

## 2019-04-18 DIAGNOSIS — I5033 Acute on chronic diastolic (congestive) heart failure: Secondary | ICD-10-CM

## 2019-04-18 LAB — ECHOCARDIOGRAM COMPLETE
Height: 68 in
Weight: 4405.67 oz

## 2019-04-18 MED ORDER — CLOPIDOGREL BISULFATE 75 MG PO TABS: 75.0000 mg | ORAL_TABLET | Freq: Every day | ORAL | 1 refills | Status: DC

## 2019-04-18 MED ORDER — ASPIRIN 81 MG PO CHEW: 81.0000 mg | CHEWABLE_TABLET | Freq: Every day | ORAL | Status: DC

## 2019-04-18 NOTE — Discharge Summary (Addendum)
[facility]VE TEAM  Discharge Summary    Patient ID: [name] MRN: [id number]; DOB: November 25, 1955  Admit date: 04/17/2019 Discharge date: 04/18/2019  Primary Care Provider: [name], MD  Primary Cardiologist: Minus Breeding, MD / Dr. Burt Knack & Dr. Cyndia Bent (TAVR)  Discharge Diagnoses    Principal Problem:   S/P TAVR (transcatheter aortic valve replacement) Active Problems:   Obesity (BMI 35.0-39.9 without comorbidity)   Hyperlipidemia   GERD (gastroesophageal reflux disease)   Depression with anxiety   Severe aortic stenosis   Post-polio syndrome   Morbid obesity (HCC)   OSA on CPAP   Acute on chronic diastolic heart failure (HCC)   Allergies Allergies  Allergen Reactions  . Amoxicillin Anaphylaxis and Swelling  . Flagyl [Metronidazole] Anaphylaxis  . Plaquenil [Hydroxychloroquine Sulfate] Other (See Comments)    Blurred vision, joint pain  . Codeine Nausea Only  . Doxycycline Nausea And Vomiting  . Other Nausea Only    Tylenol with Codeine #3  . Percocet [Oxycodone-Acetaminophen] Nausea Only  . Sulfa Antibiotics Nausea Only    Diagnostic Studies/Procedures    TAVR OPERATIVE NOTE   Date of Procedure:                04/17/2019  Preoperative Diagnosis:      Severe Aortic Stenosis   Postoperative Diagnosis:    Same   Procedure:        Transcatheter Aortic Valve Replacement - Percutaneous Right Transfemoral Approach             Edwards Sapien 3 Ultra THV (size 26 mm, model # 9750TFX, serial # [id number])              Co-Surgeons:                        Gaye Pollack, MD and Sherren Mocha, MD   Anesthesiologist:                  Rodell Perna, MD  Echocardiographer:              Liane Comber, MD  Pre-operative Echo Findings: ? Severe aortic stenosis ? Normal left ventricular systolic function  Post-operative Echo Findings: ? No paravalvular leak ? Normal left ventricular systolic function   _____________   Echo 04/18/19: completed but pending formal read at the time of discharge    History of Present Illness    [name] is a 63 y.o. female with a history of polio with post polio syndrome with left leg paralysis (wheelchair boundsince 2000), OSA on CPAP,chronic diastolic CHF,depression/anxiety, morbidobesityand severe aortic stenosis who presented to [facility] on 04/17/19 for planned TAVR.   She had an echocardiogram in July 2015 which showed mild to moderate aortic stenosis with a mean gradient of 24 mmHg and a left ventricular ejection fraction of 55 to 60%.  She reports developing exertional fatigue during the summer 2019.  She noted onset of some shortness of breath this past spring that seem to be worse when she was laying flat. She has had lower extremity edema for several years that has been worse in the left leg. She reports some intermittent chest pain over the past several months which she did not think much about. Then recently she had an episode of severe knifelike central chest pain when she was trying to reposition herself while lying in bed.  She said that she almost called 911 but it improved  after about 5 minutes and resolved.  She was seen by her PCP the following day and had an abnormal electrocardiogram and was sent to the [facility] ER.  In the ER she had an EKG showing lateral and anterolateral ST-T wave abnormalities that were more prominent anterolaterally compared to her previous tracings. High-sensitivity troponin was 41---> 46--> 50--> 51 --> 49.  A CTA of the chest, abdomen, and pelvis showed no acute abnormality.  She had a repeat 2D echocardiogram that showed severe aortic stenosis with a mean gradient of 71.8 mmHg with a peak gradient of 105 mmHg.  Dimensionless index was 0.14.  Cardiac catheterization was performed on 04/02/2019 and showed normal coronary arteries with severe aortic stenosis.  The mean gradient was measured at 65 mmHg.  There was moderate  to severely elevated left ventricular filling pressure and mildly elevated right sided heart pressures.   The patient has been evaluated by the multidisciplinary valve team and felt to have severe, symptomatic aortic stenosis and to be a suitable candidate for TAVR, which was set up for 04/17/19.   Hospital Course     Consultants: none   Severe AS: s/p successful BAV and TAVR with a 26 mm Edwards Sapien 3 Ultra THV via the TF approach on 04/17/19. Post operative echo pending. Groin sites are stable. ECG with new first degree AV block but no HAVB. She was started on Asprin 81mg  daily and Plavix 75 mg daily. Plan for DC home today with close follow up next week in the office.   Acute on chronic diastolic CHF: as evidenced by an elevated BNP on pre admission lab work. This has been treated with TAVR.   OSA: continue CPAP   _____________  Discharge Vitals Blood pressure (!) 124/95, pulse 81, temperature 98.7 F (37.1 C), temperature source Oral, resp. rate 13, height 5\' 8"  (1.727 m), weight 124.9 kg, SpO2 95 %.  Filed Weights   04/17/19 1046 04/18/19 0500  Weight: 118.8 kg 124.9 kg     GEN: Well nourished, well developed, in no acute distress, obese laying in bed.  HEENT: normal Neck: no JVD or masses Cardiac: RRR; soft flow murmur. No rubs, or gallops,no edema  Respiratory:  clear to auscultation bilaterally, normal work of breathing GI: soft, nontender, nondistended, + BS MS: no deformity or atrophy Skin: warm and dry, no rash.  Groin sites clear without hematoma or ecchymosis. Bandages removed prior to DC Neuro:  Alert and Oriented x 3, Strength and sensation are intact Psych: euthymic mood, full affect    Labs & Radiologic Studies    CBC Recent Labs    04/17/19 1518 04/17/19 1634  HGB 12.2 11.9*  HCT 36.0 [id number]*   Basic Metabolic Panel Recent Labs    04/17/19 1518 04/17/19 1634  NA 140 140  K 4.3 4.0  CL 104 105  GLUCOSE 129* 129*  BUN 13 13  CREATININE 0.50  0.50   Liver Function Tests No results for input(s): AST, ALT, ALKPHOS, BILITOT, PROT, ALBUMIN in the last 72 hours. No results for input(s): LIPASE, AMYLASE in the last 72 hours. Cardiac Enzymes No results for input(s): CKTOTAL, CKMB, CKMBINDEX, TROPONINI in the last 72 hours. BNP Invalid input(s): POCBNP D-Dimer No results for input(s): DDIMER in the last 72 hours. Hemoglobin A1C No results for input(s): HGBA1C in the last 72 hours. Fasting Lipid Panel No results for input(s): CHOL, HDL, LDLCALC, TRIG, CHOLHDL, LDLDIRECT in the last 72 hours. Thyroid Function Tests No results for input(s): TSH,  T4TOTAL, T3FREE, THYROIDAB in the last 72 hours.  Invalid input(s): FREET3 _____________  Dg Chest 2 View  Result Date: 04/13/2019 CLINICAL DATA:  63 year old female with a history preoperative exam for TAVR EXAM: CHEST - 2 VIEW COMPARISON:  03/30/2019 FINDINGS: Cardiomediastinal silhouette unchanged in size and contour. Calcifications of the aortic valve. No evidence of central vascular congestion. No pneumothorax or pleural effusion. Coarsened interstitial markings persist with no confluent airspace disease. IMPRESSION: Negative for acute cardiopulmonary disease. Aortic valve calcifications Electronically Signed   By: Corrie Mckusick D.O.   On: 04/13/2019 15:09   Dg Chest 2 View  Result Date: 03/30/2019 CLINICAL DATA:  Acute chest pain for 1 day. EXAM: CHEST - 2 VIEW COMPARISON:  09/25/2017 FINDINGS: The cardiomediastinal silhouette is unremarkable. There is no evidence of focal airspace disease, pulmonary edema, suspicious pulmonary nodule/mass, pleural effusion, or pneumothorax. No acute bony abnormalities are identified. IMPRESSION: No active cardiopulmonary disease. Electronically Signed   By: Margarette Canada M.D.   On: 03/30/2019 19:28   US Venous Img Upper Left (dvt Study)  Result Date: 04/06/2019 CLINICAL DATA:  Left upper extremity pain and edema for the past 2 days. Evaluate for DVT  EXAM: LEFT UPPER EXTREMITY VENOUS DOPPLER ULTRASOUND TECHNIQUE: Gray-scale sonography with graded compression, as well as color Doppler and duplex ultrasound were performed to evaluate the upper extremity deep venous system from the level of the subclavian vein and including the jugular, axillary, basilic, radial, ulnar and upper cephalic vein. Spectral Doppler was utilized to evaluate flow at rest and with distal augmentation maneuvers. COMPARISON:  None. FINDINGS: Contralateral Subclavian Vein: Respiratory phasicity is normal and symmetric with the symptomatic side. No evidence of thrombus. Normal compressibility. Internal Jugular Vein: No evidence of thrombus. Normal compressibility, respiratory phasicity and response to augmentation. Subclavian Vein: No evidence of thrombus. Normal compressibility, respiratory phasicity and response to augmentation. Axillary Vein: No evidence of thrombus. Normal compressibility, respiratory phasicity and response to augmentation. Cephalic Vein: While a cephalic vein appears patent proximally (image 42), there is hypoechoic occlusive thrombus within the cephalic vein at the level of the forearm (image 41). Basilic Vein: While the basilic vein appears patent proximally (images 33 through 35), there is hypoechoic occlusive thrombus within the basilic vein at the level the forearm (image 36 through 40). Brachial Veins: No evidence of thrombus. Normal compressibility, respiratory phasicity and response to augmentation. Radial Veins: No evidence of thrombus. Normal compressibility, respiratory phasicity and response to augmentation. Ulnar Veins: No evidence of thrombus. Normal compressibility, respiratory phasicity and response to augmentation. Venous Reflux:  None visualized. Other Findings:  None visualized. IMPRESSION: 1. No evidence of DVT within the left upper extremity. 2. Examination is positive for occlusive superficial thrombophlebitis involving the cephalic and basilic veins  at the level of the forearm. There is no extension of this distal occlusive SVT to the more proximal superficial venous system or to the deep venous system of the left upper extremity. Electronically Signed   By: Sandi Mariscal M.D.   On: 04/06/2019 16:27   Ct Coronary Morph W/cta Cor W/score W/ca W/cm &/or Wo/cm  Addendum Date: 04/03/2019   ADDENDUM REPORT: 04/03/2019 13:01 CLINICAL DATA:  Aortic stenosis EXAM: Cardiac TAVR CT TECHNIQUE: The patient was scanned on a Siemens Force [id number] slice scanner. A 120 kV retrospective scan was triggered in the descending thoracic aorta at 111 HU's. Gantry rotation speed was 270 msecs and collimation was .9 mm. No beta blockade or nitro were given. The 3D data set was reconstructed  in 5% intervals of the R-R cycle. Systolic and diastolic phases were analyzed on a dedicated work station using MPR, MIP and VRT modes. The patient received 80 cc of contrast. FINDINGS: Aortic Valve: Bicuspid heavily calcified with restricted leaflet motion Aorta: Mildly aneurysmal with bovine arc and mild calcific atherosclerosis Sinotubular Junction: 28 mm Ascending Thoracic Aorta: 37 mm Aortic Arch: 24 mm Descending Thoracic Aorta: 22 mm Sinus of Valsalva Measurements: Non-coronary: 31.7 mm Right - coronary: 32.2 mm Left - coronary: 32.9 mm Coronary Artery Height above Annulus: Left Main: 14.9 mm above annulus Right Coronary: 14.3 mm above annulus Virtual Basal Annulus Measurements: Maximum/Minimum Diameter: 27.8 mm x 22.8 mm Perimeter: 83 mm Area: 517 mm2 Coronary Arteries: Sufficient height above annulus for deployment Optimum Fluoroscopic Angle for Delivery: LAO 13 Cranial 3 degrees IMPRESSION: 1. Bicuspid AV with annular area of 517 mm2 suitable for a 26 mm Sapien 3 Ultra valve 2.  Coronary arteries sufficient height above annulus for deployment 3.  Mild aortic root enlargement 3.7 cm with bovine arch 4. Optimum angiographic angle for deployment LAO 13 Cranial 3 degrees [place]  Electronically Signed   By: Jenkins Rouge M.D.   On: 04/03/2019 13:01   Result Date: 04/03/2019 EXAM: OVER-READ INTERPRETATION  CT CHEST The following report is an over-read performed by radiologist Dr. Rolm Baptise of [facility] [place] on 04/03/2019. This over-read does not include interpretation of cardiac or coronary anatomy or pathology. The coronary CTA interpretation by the cardiologist is attached. COMPARISON:  03/31/2019 FINDINGS: Vascular: Heart is normal size. Aorta is normal caliber. No dissection. Mediastinum/Nodes: No adenopathy in the lower mediastinum or hila. Calcified right hilar lymph nodes. Lungs/Pleura: Tree-in-bud nodular densities noted in the peripheral right middle lobe, right lower lobe and left lower lobe, similar to prior study. No effusions. Upper Abdomen: Imaging into the upper abdomen shows no acute findings. Musculoskeletal: Chest wall soft tissues are unremarkable. No acute bony abnormality. IMPRESSION: Clustered tree-in-bud nodular densities in the peripheral right middle lobe, right lower lobe and left lower lobe, likely related to infectious or inflammatory process/small airways disease. No acute extra cardiac abnormality. Electronically Signed: By: Rolm Baptise M.D. On: 04/03/2019 10:04   Dg Chest Port 1 View  Result Date: 04/17/2019 CLINICAL DATA:  Post TAVR EXAM: PORTABLE CHEST 1 VIEW COMPARISON:  04/13/2019 FINDINGS: Changes of aortic valve repair. Heart is borderline in size. No confluent airspace opacities or effusions. No pneumothorax. No acute bony abnormality. IMPRESSION: No acute cardiopulmonary disease. Electronically Signed   By: Rolm Baptise M.D.   On: 04/17/2019 19:18   Ct Angio Chest/abd/pel For Dissection W And/or Wo Contrast  Result Date: 03/31/2019 CLINICAL DATA:  Chest/back pain, acute, aortic dissection suspect EXAM: CT ANGIOGRAPHY CHEST, ABDOMEN AND PELVIS TECHNIQUE: Multidetector CT imaging through the chest, abdomen and pelvis was performed  using the standard protocol during bolus administration of intravenous contrast. Multiplanar reconstructed images and MIPs were obtained and reviewed to evaluate the vascular anatomy. CONTRAST:  150mL OMNIPAQUE IOHEXOL 350 MG/ML SOLN COMPARISON:  Chest radiograph yesterday. Abdominopelvic CT 03/31/2016 FINDINGS: CTA CHEST FINDINGS Cardiovascular: Thoracic aorta is normal in caliber. No aortic dissection, acute aortic syndrome or aneurysm. Conventional branching pattern from the aortic arch. Aortic valvular calcifications. No central pulmonary embolus to the lobar level. Heart is normal in size. No pericardial effusion. Mediastinum/Nodes: Calcified right hilar nodes consistent with prior granulomatous disease. No suspicious noncalcified adenopathy. Subcentimeter left thyroid nodule does not meet size criteria for further evaluation. Patulous esophagus. Small hiatal hernia.  Lungs/Pleura: Heterogeneous pulmonary parenchyma. 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 Clustered nodules in the subpleural right middle lobe, series 8, image 104, chronic and unchanged from 2017 abdominal CT. No pulmonary edema. No pleural fluid. The trachea and mainstem bronchi are patent. Musculoskeletal: Mild multilevel degenerative change in the spine. There are no acute or suspicious osseous abnormalities. Degenerative change in the right shoulder. Review of the MIP images confirms the above findings. CTA ABDOMEN AND PELVIS FINDINGS VASCULAR Aorta: Normal caliber aorta without aneurysm, dissection, vasculitis or significant stenosis. Mild atherosclerosis. Celiac: Patent without evidence of aneurysm, dissection, vasculitis or significant stenosis. SMA: Patent without evidence of aneurysm, dissection, vasculitis or significant stenosis. Renals: Both renal arteries are patent without evidence of aneurysm, dissection, vasculitis, fibromuscular dysplasia or significant  stenosis. IMA: Patent without evidence of aneurysm, dissection, vasculitis or significant stenosis. Inflow: Patent without evidence of aneurysm, dissection, vasculitis or significant stenosis. Veins: Limited assessment on this arterial phase exam. Review of the MIP images confirms the above findings. NON-VASCULAR Hepatobiliary: No focal liver abnormality is seen. Status post cholecystectomy. No biliary dilatation. Pancreas: No ductal dilatation or inflammation. Spleen: Normal in size and arterial enhancement. Adrenals/Urinary Tract: No adrenal nodule. Suggestion of slight left renal atrophy. No hydronephrosis. Tiny cortical hypodensities in the left kidney are too small to characterize. Urinary bladder partially distended. No bladder wall thickening. Stomach/Bowel: Small hiatal hernia. Stomach is decompressed and not well assessed. No small bowel wall thickening or inflammatory change. Prior appendectomy with surgical clips in the right lower quadrant. Small volume of stool throughout the colon. Colonic diverticulosis from the splenic flexure distally, prominent in the sigmoid. No evidence of diverticulitis. Lymphatic: No abdominopelvic adenopathy. Reproductive: Status post hysterectomy. No adnexal masses. Other: Postsurgical change of the right anterior abdominal wall with mesh. Fat containing umbilical hernia. No free air or free fluid. Musculoskeletal: Right hip arthroplasty. Degenerative change in the spine with multilevel endplate changes. There are no acute or suspicious osseous abnormalities. Review of the MIP images confirms the above findings. IMPRESSION: 1. No aortic dissection or acute aortic abnormality. Mild thoracoabdominal aortic atherosclerosis. Aortic valvular calcifications, consider further evaluation with echocardiogram. 2. No acute abnormality in the chest, abdomen, or pelvis. 3. Incidental colonic diverticulosis without diverticulitis. Small hiatal hernia. 4. Right abdominal wall hernia repair  with mesh. Fat containing umbilical hernia. Aortic Atherosclerosis (ICD10-I70.0). Electronically Signed   By: Keith Rake M.D.   On: 03/31/2019 02:42   Vas US Carotid  Result Date: 04/03/2019 Carotid Arterial Duplex Study Risk Factors:      Hyperlipidemia. Other Factors:     Aortic valve disease. Pre Operative TAVR. History of Polio. Limitations        Today's exam was limited due to the body habitus of the                    patient. Comparison Study:  No prior study on file for comparison Performing Technologist: Sharion Dove RVS  Examination Guidelines: A complete evaluation includes B-mode imaging, spectral Doppler, color Doppler, and power Doppler as needed of all accessible portions of each vessel. Bilateral testing is considered an integral part of a complete examination. Limited examinations for reoccurring indications may be performed as noted.  Right Carotid Findings: +----------+--------+--------+--------+------------------+------------------+           PSV cm/sEDV cm/sStenosisPlaque DescriptionComments           +----------+--------+--------+--------+------------------+------------------+ CCA Prox  78      17  intimal thickening +----------+--------+--------+--------+------------------+------------------+ CCA Distal85      21                                intimal thickening +----------+--------+--------+--------+------------------+------------------+ ICA Prox  51      15                                                   +----------+--------+--------+--------+------------------+------------------+ ICA Distal80      23                                tortuous           +----------+--------+--------+--------+------------------+------------------+ ECA       72      12                                                   +----------+--------+--------+--------+------------------+------------------+  +----------+--------+-------+--------+-------------------+           PSV cm/sEDV cmsDescribeArm Pressure (mmHG) +----------+--------+-------+--------+-------------------+ [id number]                                        +----------+--------+-------+--------+-------------------+ +---------+--------+--+--------+-+ VertebralPSV cm/s43EDV cm/s9 +---------+--------+--+--------+-+  Left Carotid Findings: +----------+--------+--------+--------+------------------+------------------+           PSV cm/sEDV cm/sStenosisPlaque DescriptionComments           +----------+--------+--------+--------+------------------+------------------+ CCA Prox  69      23                                intimal thickening +----------+--------+--------+--------+------------------+------------------+ CCA Distal74      23                                intimal thickening +----------+--------+--------+--------+------------------+------------------+ ICA Prox  80      24                                                   +----------+--------+--------+--------+------------------+------------------+ ICA Distal66      18                                tortuous           +----------+--------+--------+--------+------------------+------------------+ ECA       96      11                                                   +----------+--------+--------+--------+------------------+------------------+ +----------+--------+--------+--------+-------------------+           PSV cm/sEDV cm/sDescribeArm Pressure (mmHG) +----------+--------+--------+--------+-------------------+ [id number]                                         +----------+--------+--------+--------+-------------------+ +---------+--------+--+--------+--+  VertebralPSV cm/s71EDV cm/s31 +---------+--------+--+--------+--+  Summary: Right Carotid: The extracranial vessels were near-normal with only minimal wall                 thickening or plaque. Left Carotid: The extracranial vessels were near-normal with only minimal wall               thickening or plaque. Vertebrals:  Bilateral vertebral arteries demonstrate antegrade flow. Subclavians: Normal flow hemodynamics were seen in bilateral subclavian              arteries. *See table(s) above for measurements and observations.  Electronically signed by Ruta Hinds MD on 04/03/2019 at 11:16:57 AM.    Final    Disposition   Pt is being discharged home today in good condition.  Follow-up Plans & Appointments    Follow-up Information    Eileen Stanford, PA-C. Go on 04/25/2019.   Specialties: Cardiology, Radiology Why: @ 1pm, please arrive at least 10 minutes early.  Contact information: [address] [place] [address] [phone]            Discharge Medications   Allergies as of 04/18/2019      Reactions   Amoxicillin Anaphylaxis, Swelling   Flagyl [metronidazole] Anaphylaxis   Plaquenil [hydroxychloroquine Sulfate] Other (See Comments)   Blurred vision, joint pain   Codeine Nausea Only   Doxycycline Nausea And Vomiting   Other Nausea Only   Tylenol with Codeine #3   Percocet [oxycodone-acetaminophen] Nausea Only   Sulfa Antibiotics Nausea Only      Medication List    TAKE these medications   ALPRAZolam 0.5 MG tablet Commonly known as: XANAX Take 0.5 mg by mouth 2 (two) times daily as needed for sleep.   aspirin 81 MG chewable tablet Chew 1 tablet (81 mg total) by mouth daily. Start taking on: April 19, 2019   clopidogrel 75 MG tablet Commonly known as: PLAVIX Take 1 tablet (75 mg total) by mouth daily with breakfast. Start taking on: April 19, 2019   D3 High Potency 25 MCG (1000 UT) capsule Generic drug: Cholecalciferol Take 1,000 Units by mouth daily.   docusate sodium 100 MG capsule Commonly known as: COLACE Take 100 mg by mouth daily.   gabapentin 300 MG capsule Commonly known as: NEURONTIN  Take 300 mg by mouth daily.   pantoprazole 40 MG tablet Commonly known as: PROTONIX Take 40 mg by mouth daily.   traZODone 100 MG tablet Commonly known as: DESYREL Take 100 mg by mouth at bedtime.   venlafaxine XR 75 MG 24 hr capsule Commonly known as: EFFEXOR-XR Take 75 mg by mouth daily with breakfast.   vitamin B-12 250 MCG tablet Commonly known as: CYANOCOBALAMIN Take 250 mcg by mouth daily.        Outstanding Labs/Studies  none  Duration of Discharge Encounter   Greater than 30 minutes including physician time.  Mable Fill, PA-C 04/18/2019, 11:07 AM [phone]  Patient seen, examined. Available data reviewed. Agree with findings, assessment, and plan as outlined by Nell Range, PA-C.  The patient is independently interviewed and examined this morning.  She is feeling really well and feels like the weight has been lifted off of her chest.  On exam, she is alert, oriented, in no distress.  Lungs are clear, heart is regular rate and rhythm with a 2/6 systolic murmur at the right upper sternal border with no diastolic murmur.  Bilateral groin sites are clear.  There is no pretibial  edema.  The patient's echo images are reviewed.  She has poor acoustic windows.  The LVEF appears grossly normal.  The mean transvalvular aortic gradient is 17 mmHg.  There is no paravalvular regurgitation.  The patient has done very well with TAVR and appears stable for hospital discharge today.  We reviewed discharge instructions and postprocedural restrictions at length.  Otherwise as outlined above.  Sherren Mocha, M.D. 04/18/2019 12:26 PM

## 2019-04-18 NOTE — Progress Notes (Signed)
  Echocardiogram 2D Echocardiogram has been performed.  Burnett Kanaris 04/18/2019, 10:41 AM

## 2019-04-18 NOTE — Op Note (Signed)
[facility]   MULTIDISCIPLINARY HEART VALVE TEAM   TAVR OPERATIVE NOTE   Date of Procedure:  04/17/2019  Preoperative Diagnosis: Severe Aortic Stenosis   Postoperative Diagnosis: Same   Procedure:    Transcatheter Aortic Valve Replacement - Percutaneous Right Transfemoral Approach  Edwards Sapien 3 Ultra THV (size 26 mm, model # 9750TFX, serial # [id number])   Co-Surgeons:  Gaye Pollack, MD and Sherren Mocha, MD   Anesthesiologist:  Darnell Level. Kalman Shan, MD  Echocardiographer:  Liane Comber, MD  Pre-operative Echo Findings:  Severe aortic stenosis  Normal left ventricular systolic function  Post-operative Echo Findings:  No paravalvular leak  Normal left ventricular systolic function   BRIEF CLINICAL NOTE AND INDICATIONS FOR SURGERY  The patient is a 63 year old woman with stage D, severe, symptomatic aortic stenosis with New York Heart Association class III symptoms of exertional fatigue associated with some shortness of breath and chest discomfort consistent with chronic diastolic congestive heart failure. She had a severe episode of chest pain leading up to her recent admission. I have personally reviewed her 2D echocardiogram, cardiac catheterization, and CTA studies. Her echocardiogram shows a severely calcified aortic valve with a mean gradient of 71.8 mmHg and a peak gradient of 105 mmHg consistent with severe aortic stenosis. There is mild aortic insufficiency. Left ventricular systolic function is normal. Cardiac catheterization shows normal coronary arteries. The mean aortic valve gradient was measured at 65 mmHg with a calculated aortic valve area of 0.5 cm. I agree that aortic valve placement is indicated in this patient. 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 Her gated cardiac CTA shows anatomy suitable for transcatheter aortic valve replacement using a SAPIEN 3 valve. Her  abdominal and pelvic CTA shows adequate pelvic vascular anatomy to allow transfemoral insertion.  The patient was counseled at length regarding treatment alternatives for management of severe symptomatic aortic stenosis. The risks and benefits of surgical intervention has been discussed in detail. Long-term prognosis with medical therapy was discussed. Alternative approaches such as conventional surgical aortic valve replacement, transcatheter aortic valve replacement, and palliative medical therapy were compared and contrasted at length. This discussion was placed in the context of the patient's own specific clinical presentation and past medical history. All of her questions havebeen addressed.  Following the decision to proceed with transcatheter aortic valve replacement, a discussion was held regarding what types of management strategies would be attempted intraoperatively in the event of life-threatening complications, including whether or not the patient would be considered a candidate for the use of cardiopulmonary bypass and/or conversion to open sternotomy for attempted surgical intervention. She is a low surgical risk patient and only 63 years old so I think she would be a candidate for emergent sternotomy to manage any intraoperative complications although she would have a slow recovery due to her nonambulatory status.  The patient however has made it clear that she would not want to undergo median sternotomy under any circumstances due to the difficulty in recovery and temporary loss of her independence.  The patient has been advised of a variety of complications that might develop including but not limited to risks of death, stroke, paravalvular leak, aortic dissection or other major vascular complications, aortic annulus rupture, device embolization, cardiac rupture or perforation, mitral regurgitation, acute myocardial infarction, arrhythmia, heart block or bradycardia requiring permanent pacemaker  placement, congestive heart failure, respiratory failure, renal failure, pneumonia, infection, other late complications related to  structural valve deterioration or migration, or other complications that might ultimately cause a temporary or permanent loss of functional independence or other long term morbidity. The patient provides full informed consent for the procedure as described and all questions were answered.     DETAILS OF THE OPERATIVE PROCEDURE  PREPARATION:    The patient is brought to the operating room on the above mentioned date and appropriate monitoring was established by the anesthesia team. The patient is placed in the supine position on the operating table.  Intravenous antibiotics are administered. The patient is monitored closely throughout the procedure under conscious sedation. Baseline transthoracic echocardiogram was performed. The patient's abdomen and both groins are prepared and draped in a sterile manner. A time out procedure is performed.   PERIPHERAL ACCESS:    Using the modified Seldinger technique, femoral arterial and venous access was obtained with placement of 6 Fr sheaths on the left side.  A pigtail diagnostic catheter was passed through the left arterial sheath under fluoroscopic guidance into the aortic root.  A temporary transvenous pacemaker catheter was passed through the left femoral venous sheath under fluoroscopic guidance into the right ventricle.  The pacemaker was tested to ensure stable lead placement and pacemaker capture. Aortic root angiography was performed in order to determine the optimal angiographic angle for valve deployment.   TRANSFEMORAL ACCESS:   Percutaneous transfemoral access and sheath placement was performed using ultrasound guidance.  The right common femoral artery was cannulated using a micropuncture needle and appropriate location was verified using hand injection angiogram.  A pair of Abbott Perclose percutaneous closure  devices were placed and a 6 French sheath replaced into the femoral artery.  The patient was heparinized systemically and ACT verified > 250 seconds.    A 14 Fr transfemoral E-sheath was introduced into the right common femoral artery after progressively dilating over an Amplatz superstiff wire. An AL-1 catheter was used to direct a straight-tip exchange length wire across the native aortic valve into the left ventricle. This was exchanged out for a pigtail catheter and position was confirmed in the LV apex. Simultaneous LV and Ao pressures were recorded.  The pigtail catheter was exchanged for an Amplatz Extra-stiff wire in the LV apex.  Echocardiography was utilized to confirm appropriate wire position and no sign of entanglement in the mitral subvalvular apparatus.   BALLOON AORTIC VALVULOPLASTY:   Balloon aortic valvuloplasty was performed using a 23 mm valvuloplasty balloon.  Once optimal position was achieved, BAV was done under rapid ventricular pacing. The patient recovered well hemodynamically.    TRANSCATHETER HEART VALVE DEPLOYMENT:   An Edwards Sapien 3 Ultra transcatheter heart valve (size 26 mm, model #[id number], serial [id number]) was prepared and crimped per manufacturer's guidelines, and the proper orientation of the valve is confirmed on the [employer] delivery system. The valve was advanced through the introducer sheath using normal technique until in an appropriate position in the abdominal aorta beyond the sheath tip. The balloon was then retracted and using the fine-tuning wheel was centered on the valve. The valve was then advanced across the aortic arch using appropriate flexion of the catheter. The valve was carefully positioned across the aortic valve annulus. The Commander catheter was retracted using normal technique. Once final position of the valve has been confirmed by angiographic assessment, the valve is deployed while temporarily holding ventilation and during rapid  ventricular pacing to maintain systolic blood pressure < 50 mmHg and pulse pressure < 10 mmHg. The balloon inflation is  held for >3 seconds after reaching full deployment volume. Once the balloon has fully deflated the balloon is retracted into the ascending aorta and valve function is assessed using echocardiography. There is felt to be no paravalvular leak and no central aortic insufficiency.  The patient's hemodynamic recovery following valve deployment is good.  The deployment balloon and guidewire are both removed.    PROCEDURE COMPLETION:   The sheath was removed and femoral artery closure performed.  Protamine was administered once femoral arterial repair was complete. The temporary pacemaker, pigtail catheters and femoral sheaths were removed with manual pressure used for hemostasis.  A Mynx femoral closure device was utilized following removal of the diagnostic sheath in the left femoral artery.  The patient tolerated the procedure well and is transported to the cath lab recovery area in stable condition. There were no immediate intraoperative complications. All sponge instrument and needle counts are verified correct at completion of the operation.   No blood products were administered during the operation.  The patient received a total of 60 mL of intravenous contrast during the procedure.   Gaye [place]ack, MD 04/17/2019

## 2019-04-19 ENCOUNTER — Encounter (HOSPITAL_COMMUNITY): Payer: Self-pay

## 2019-04-19 ENCOUNTER — Emergency Department (HOSPITAL_BASED_OUTPATIENT_CLINIC_OR_DEPARTMENT_OTHER): Payer: Medicare Other

## 2019-04-19 ENCOUNTER — Other Ambulatory Visit: Payer: Self-pay

## 2019-04-19 ENCOUNTER — Emergency Department (HOSPITAL_COMMUNITY): Payer: Medicare Other

## 2019-04-19 ENCOUNTER — Observation Stay (HOSPITAL_COMMUNITY)
Admission: EM | Admit: 2019-04-19 | Discharge: 2019-04-20 | Disposition: A | Discharge status: Home / Self Care | Payer: Medicare Other | Attending: Cardiovascular Disease | Admitting: Cardiovascular Disease

## 2019-04-19 DIAGNOSIS — Z79899 Other long term (current) drug therapy: Secondary | ICD-10-CM | POA: Insufficient documentation

## 2019-04-19 DIAGNOSIS — G8314 Monoplegia of lower limb affecting left nondominant side: Secondary | ICD-10-CM | POA: Diagnosis not present

## 2019-04-19 DIAGNOSIS — G4733 Obstructive sleep apnea (adult) (pediatric): Secondary | ICD-10-CM | POA: Insufficient documentation

## 2019-04-19 DIAGNOSIS — Z7982 Long term (current) use of aspirin: Secondary | ICD-10-CM | POA: Diagnosis not present

## 2019-04-19 DIAGNOSIS — Z993 Dependence on wheelchair: Secondary | ICD-10-CM | POA: Diagnosis not present

## 2019-04-19 DIAGNOSIS — R079 Chest pain, unspecified: Principal | ICD-10-CM

## 2019-04-19 DIAGNOSIS — I5032 Chronic diastolic (congestive) heart failure: Secondary | ICD-10-CM | POA: Insufficient documentation

## 2019-04-19 DIAGNOSIS — F418 Other specified anxiety disorders: Secondary | ICD-10-CM | POA: Diagnosis not present

## 2019-04-19 DIAGNOSIS — Z952 Presence of prosthetic heart valve: Secondary | ICD-10-CM | POA: Diagnosis not present

## 2019-04-19 DIAGNOSIS — Z6841 Body Mass Index (BMI) 40.0 and over, adult: Secondary | ICD-10-CM | POA: Insufficient documentation

## 2019-04-19 DIAGNOSIS — Z96641 Presence of right artificial hip joint: Secondary | ICD-10-CM | POA: Insufficient documentation

## 2019-04-19 DIAGNOSIS — Z7902 Long term (current) use of antithrombotics/antiplatelets: Secondary | ICD-10-CM | POA: Insufficient documentation

## 2019-04-19 DIAGNOSIS — G4700 Insomnia, unspecified: Secondary | ICD-10-CM | POA: Diagnosis not present

## 2019-04-19 DIAGNOSIS — K219 Gastro-esophageal reflux disease without esophagitis: Secondary | ICD-10-CM | POA: Diagnosis not present

## 2019-04-19 DIAGNOSIS — I2510 Atherosclerotic heart disease of native coronary artery without angina pectoris: Secondary | ICD-10-CM | POA: Diagnosis not present

## 2019-04-19 DIAGNOSIS — B91 Sequelae of poliomyelitis: Secondary | ICD-10-CM | POA: Diagnosis not present

## 2019-04-19 DIAGNOSIS — M549 Dorsalgia, unspecified: Secondary | ICD-10-CM

## 2019-04-19 DIAGNOSIS — Z20828 Contact with and (suspected) exposure to other viral communicable diseases: Secondary | ICD-10-CM | POA: Insufficient documentation

## 2019-04-19 DIAGNOSIS — G14 Postpolio syndrome: Secondary | ICD-10-CM

## 2019-04-19 DIAGNOSIS — Z8249 Family history of ischemic heart disease and other diseases of the circulatory system: Secondary | ICD-10-CM | POA: Insufficient documentation

## 2019-04-19 DIAGNOSIS — R072 Precordial pain: Secondary | ICD-10-CM | POA: Diagnosis not present

## 2019-04-19 DIAGNOSIS — E669 Obesity, unspecified: Secondary | ICD-10-CM | POA: Diagnosis present

## 2019-04-19 DIAGNOSIS — M5489 Other dorsalgia: Secondary | ICD-10-CM

## 2019-04-19 LAB — BASIC METABOLIC PANEL
Anion gap: 9 (ref 5–15)
BUN: 7 mg/dL — ABNORMAL LOW (ref 8–23)
CO2: 24 mmol/L (ref 22–32)
Calcium: 8.5 mg/dL — ABNORMAL LOW (ref 8.9–10.3)
Chloride: 106 mmol/L (ref 98–111)
Creatinine, Ser: 0.74 mg/dL (ref 0.44–1.00)
GFR calc Af Amer: >60 mL/min (ref >60)
GFR calc non Af Amer: >60 mL/min (ref >60)
Glucose, Bld: 112 mg/dL — ABNORMAL HIGH (ref 70–99)
Potassium: 3.8 mmol/L (ref 3.5–5.1)
Sodium: 139 mmol/L (ref 135–145)

## 2019-04-19 LAB — CBC
HCT: 39.1 % (ref 36.0–46.0)
Hemoglobin: 12.9 g/dL (ref 12.0–15.0)
MCH: 31.5 pg (ref 26.0–34.0)
MCHC: 33 g/dL (ref 30.0–36.0)
MCV: 95.6 fL (ref 80.0–100.0)
Platelets: 195 10*3/uL (ref 150–400)
RBC: 4.09 MIL/uL (ref 3.87–5.11)
RDW: 13.3 % (ref 11.5–15.5)
WBC: 7.7 10*3/uL (ref 4.0–10.5)
nRBC: 0 % (ref 0.0–0.2)

## 2019-04-19 LAB — ECHOCARDIOGRAM LIMITED
Height: 68 in
Weight: 4304 oz

## 2019-04-19 LAB — TROPONIN I (HIGH SENSITIVITY)
Troponin I (High Sensitivity): 358 ng/L (ref <18)
Troponin I (High Sensitivity): 349 ng/L (ref <18)

## 2019-04-19 LAB — SARS CORONAVIRUS 2 (TAT 6-24 HRS): SARS Coronavirus 2: NEGATIVE

## 2019-04-19 MED ORDER — PANTOPRAZOLE SODIUM 40 MG PO TBEC
40.0000 mg | DELAYED_RELEASE_TABLET | Freq: Every day | ORAL | Status: DC
  Administered 2019-04-19 – 2019-04-20 (×2): 40 mg via ORAL
  Filled 2019-04-19 (×2): qty 1

## 2019-04-19 MED ORDER — TRAZODONE HCL 100 MG PO TABS
100.0000 mg | ORAL_TABLET | Freq: Every day | ORAL | Status: DC
  Administered 2019-04-19: 100 mg via ORAL
  Filled 2019-04-19: qty 1

## 2019-04-19 MED ORDER — ACETAMINOPHEN 325 MG PO TABS
650.0000 mg | ORAL_TABLET | Freq: Once | ORAL | Status: AC
  Administered 2019-04-19: 650 mg via ORAL
  Filled 2019-04-19: qty 2

## 2019-04-19 MED ORDER — CLOPIDOGREL BISULFATE 75 MG PO TABS
75.0000 mg | ORAL_TABLET | Freq: Every day | ORAL | Status: DC
  Administered 2019-04-20: 75 mg via ORAL
  Filled 2019-04-19: qty 1

## 2019-04-19 MED ORDER — ENOXAPARIN SODIUM 40 MG/0.4ML ~~LOC~~ SOLN
40.0000 mg | SUBCUTANEOUS | Status: DC
  Administered 2019-04-19: 40 mg via SUBCUTANEOUS
  Filled 2019-04-19: qty 0.4

## 2019-04-19 MED ORDER — ACETAMINOPHEN 325 MG PO TABS
650.0000 mg | ORAL_TABLET | ORAL | Status: DC | PRN
  Administered 2019-04-20: 650 mg via ORAL
  Filled 2019-04-19: qty 2

## 2019-04-19 MED ORDER — GABAPENTIN 300 MG PO CAPS
300.0000 mg | ORAL_CAPSULE | Freq: Every day | ORAL | Status: DC
  Administered 2019-04-19 – 2019-04-20 (×2): 300 mg via ORAL
  Filled 2019-04-19 (×2): qty 1

## 2019-04-19 MED ORDER — PERFLUTREN LIPID MICROSPHERE
1.0000 mL | INTRAVENOUS | Status: AC | PRN
  Administered 2019-04-19: 2 mL via INTRAVENOUS
  Filled 2019-04-19: qty 10

## 2019-04-19 MED ORDER — ONDANSETRON HCL 4 MG/2ML IJ SOLN: 4.0000 mg | Freq: Four times a day (QID) | INTRAMUSCULAR | Status: DC | PRN

## 2019-04-19 MED ORDER — VITAMIN D 25 MCG (1000 UNIT) PO TABS
1000.0000 [IU] | ORAL_TABLET | Freq: Every day | ORAL | Status: DC
  Administered 2019-04-19 – 2019-04-20 (×2): 1000 [IU] via ORAL
  Filled 2019-04-19 (×2): qty 1

## 2019-04-19 MED ORDER — ALPRAZOLAM 0.5 MG PO TABS
0.5000 mg | ORAL_TABLET | Freq: Two times a day (BID) | ORAL | Status: DC | PRN
  Administered 2019-04-19: 0.5 mg via ORAL
  Filled 2019-04-19: qty 1

## 2019-04-19 MED ORDER — VITAMIN B-12 100 MCG PO TABS
250.0000 ug | ORAL_TABLET | Freq: Every day | ORAL | Status: DC
  Administered 2019-04-19 – 2019-04-20 (×2): 250 ug via ORAL
  Filled 2019-04-19 (×3): qty 3

## 2019-04-19 MED ORDER — ASPIRIN 81 MG PO CHEW
81.0000 mg | CHEWABLE_TABLET | Freq: Every day | ORAL | Status: DC
  Administered 2019-04-20: 81 mg via ORAL
  Filled 2019-04-19 (×2): qty 1

## 2019-04-19 MED ORDER — IOHEXOL 350 MG/ML SOLN
80.0000 mL | Freq: Once | INTRAVENOUS | Status: AC | PRN
  Administered 2019-04-19: 80 mL via INTRAVENOUS

## 2019-04-19 MED ORDER — DOCUSATE SODIUM 100 MG PO CAPS
100.0000 mg | ORAL_CAPSULE | Freq: Every day | ORAL | Status: DC
  Administered 2019-04-20: 100 mg via ORAL
  Filled 2019-04-19: qty 1

## 2019-04-19 MED ORDER — VENLAFAXINE HCL ER 75 MG PO CP24
75.0000 mg | ORAL_CAPSULE | Freq: Every day | ORAL | Status: DC
  Administered 2019-04-20: 75 mg via ORAL
  Filled 2019-04-19: qty 1

## 2019-04-19 NOTE — ED Notes (Signed)
PureWick placed.

## 2019-04-19 NOTE — ED Notes (Signed)
Pt is NSR w/BBB w/ST depression on monitor 

## 2019-04-19 NOTE — ED Notes (Signed)
ED TO INPATIENT HANDOFF REPORT  ED Nurse Name and Phone #: Lorrin Goodell [phone]  S Name/Age/Gender [name] 63 y.o. female Room/Bed: 017C/017C  Code Status   Code Status: Full Code  Home/SNF/Other Home Patient oriented to: self, place, time and situation Is this baseline? Yes   Triage Complete: Triage complete  Chief Complaint cp  Triage Note Pt BIB by [facility]. Pt had aortic valve replacement surgery here and sent home yesterday. Woke this am with acute onset of extreme pain to center of her back radiating through to her chest. Pt took 324 mg ASA prior to ems arrival    Allergies Allergies  Allergen Reactions  . Amoxicillin Anaphylaxis and Swelling    Did it involve swelling of the face/tongue/throat, SOB, or low BP? Yes Did it involve sudden or severe rash/hives, skin peeling, or any reaction on the inside of your mouth or nose? No Did you need to seek medical attention at a hospital or doctor's office? Yes When did it last happen?yrs ago If all above answers are "NO", may proceed with cephalosporin use.   . Flagyl [Metronidazole] Anaphylaxis  . Plaquenil [Hydroxychloroquine Sulfate] Other (See Comments)    Blurred vision, joint pain  . Codeine Nausea Only  . Doxycycline Nausea And Vomiting  . Other Nausea Only    Tylenol with Codeine #3  . Percocet [Oxycodone-Acetaminophen] Nausea Only  . Sulfa Antibiotics Nausea Only    Level of Care/Admitting Diagnosis ED Disposition    ED Disposition Condition [facility] [facility] Area: [place] [100100]  Level of Care: Telemetry Cardiac [103]  Covid Evaluation: Asymptomatic Screening Protocol (No Symptoms)  Diagnosis: Chest pain [id number]  Admitting Physician: [place], [place]  Attending Physician: Sanda Klein [id number]  PT Class (Do Not Modify): Observation [104]  PT Acc Code (Do Not Modify): Observation [10022]       B Medical/Surgery History Past Medical History:   Diagnosis Date  . Anxiety   . Arthritis    low back  . Cervical pain (neck)   . Coronary artery disease   . Depression   . Diverticulitis   . GERD (gastroesophageal reflux disease)   . Hyperlipidemia   . Insomnia   . Morbid obesity ([place])   . OSA on CPAP   . Post-polio syndrome    With left leg paralysis  . S/P TAVR (transcatheter aortic valve replacement)   . Severe aortic stenosis   . Sigmoid diverticulosis    Past Surgical History:  Procedure Laterality Date  . ANKLE FUSION     left  . APPENDECTOMY    . CHOLECYSTECTOMY    . EYE SURGERY Bilateral 2020   cataracts removed August and September 2020  . JOINT REPLACEMENT Right 2003   three replacements; 2003 is last one  . KNEE ARTHROPLASTY    . LEG SURGERY     muscle surgery related to polio  . RECTAL PROLAPSE REPAIR  2020   pessary placed  . RIGHT/LEFT HEART CATH AND CORONARY ANGIOGRAPHY N/A 04/02/2019   Procedure: RIGHT/LEFT HEART CATH AND CORONARY ANGIOGRAPHY;  Surgeon: Nelva Bush, MD;  Location: [facility];  Service: Cardiovascular;  Laterality: N/A;  . TEE WITHOUT CARDIOVERSION N/A 04/17/2019   Procedure: TRANSESOPHAGEAL ECHOCARDIOGRAM (TEE);  Surgeon: Sherren Mocha, MD;  Location: [facility];  Service: Open Heart Surgery;  Laterality: N/A;  . TOTAL HIP ARTHROPLASTY     right x 3  . TRANSCATHETER AORTIC VALVE REPLACEMENT, TRANSFEMORAL N/A 04/17/2019  Procedure: TRANSCATHETER AORTIC VALVE REPLACEMENT, TRANSFEMORAL;  Surgeon: Sherren Mocha, MD;  Location: [facility];  Service: Open Heart Surgery;  Laterality: N/A;  . UMBILICAL HERNIA REPAIR       A IV Location/Drains/Wounds Patient Lines/Drains/Airways Status   Active Line/Drains/Airways    Name:   Placement date:   Placement time:   Site:   Days:   Peripheral IV 04/19/19 Right;Anterior;Upper Forearm   04/19/19    0927    Forearm   less than 1          Intake/Output Last 24 hours  Intake/Output Summary (Last 24 hours) at  04/19/2019 1343 Last data filed at 04/19/2019 1307 Gross per 24 hour  Intake -  Output 450 ml  Net -450 ml    Labs/Imaging Results for orders placed or performed during the hospital encounter of 04/19/19 (from the past 48 hour(s))  Basic metabolic panel     Status: Abnormal   Collection Time: 04/19/19  8:59 AM  Result Value Ref Range   Sodium 139 135 - 145 mmol/L   Potassium 3.8 3.5 - 5.1 mmol/L   Chloride 106 98 - 111 mmol/L   CO2 24 22 - 32 mmol/L   Glucose, Bld 112 (H) 70 - 99 mg/dL   BUN 7 (L) 8 - 23 mg/dL   Creatinine, Ser 0.74 0.44 - 1.00 mg/dL   Calcium 8.5 (L) 8.9 - 10.3 mg/dL   GFR calc non Af Amer >60 >60 mL/min   GFR calc Af Amer >60 >60 mL/min   Anion gap 9 5 - 15    Comment: Performed at [facility], [place] [address], [place], [place] [address]  CBC     Status: None   Collection Time: 04/19/19  8:59 AM  Result Value Ref Range   WBC 7.7 4.0 - 10.5 K/uL   RBC 4.09 3.87 - 5.11 MIL/uL   Hemoglobin 12.9 12.0 - 15.0 g/dL   HCT 39.1 36.0 - 46.0 %   MCV 95.6 80.0 - 100.0 fL   MCH 31.5 26.0 - 34.0 pg   MCHC 33.0 30.0 - 36.0 g/dL   RDW 13.3 11.5 - 15.5 %   Platelets 195 150 - 400 K/uL   nRBC 0.0 0.0 - 0.2 %    Comment: Performed at [facility], [place] [address], [place], [place] [address]  Troponin I (High Sensitivity)     Status: Abnormal   Collection Time: 04/19/19  8:59 AM  Result Value Ref Range   Troponin I (High Sensitivity) 358 (HH) <18 ng/L    Comment: CRITICAL RESULT CALLED TO, READ BACK BY AND VERIFIED WITH: SHULAR,L RN @0951  ON [id number] BY FLEMINGS (NOTE) Elevated high sensitivity troponin I (hsTnI) values and significant  changes across serial measurements may suggest ACS but many other  chronic and acute conditions are known to elevate hsTnI results.  Refer to the Links section for chest pain algorithms and additional  guidance. Performed at [facility], [place] [address] [place], [place] [address]   Troponin I (High Sensitivity)      Status: Abnormal   Collection Time: 04/19/19 10:52 AM  Result Value Ref Range   Troponin I (High Sensitivity) 349 (HH) <18 ng/L    Comment: CRITICAL VALUE NOTED.  VALUE IS CONSISTENT WITH PREVIOUSLY REPORTED AND CALLED VALUE. (NOTE) Elevated high sensitivity troponin I (hsTnI) values and significant  changes across serial measurements may suggest ACS but many other  chronic and acute conditions are known  to elevate hsTnI results.  Refer to the Links section for chest pain algorithms and additional  guidance. Performed at [facility], [place] [address], [place], [place] [address]    Ct Angio Chest Pe W/cm &/or Wo Cm  Result Date: 04/19/2019 CLINICAL DATA:  63 year old female with chest pain. Status post aortic valve replacement. EXAM: CT ANGIOGRAPHY CHEST WITH CONTRAST TECHNIQUE: Multidetector CT imaging of the chest was performed using the standard protocol during bolus administration of intravenous contrast. Multiplanar CT image reconstructions and MIPs were obtained to evaluate the vascular anatomy. CONTRAST:  64mL OMNIPAQUE IOHEXOL 350 MG/ML SOLN COMPARISON:  Chest radiograph dated 04/19/2019 and CT dated 03/31/2019. FINDINGS: Cardiovascular: Top-normal cardiac size. No pericardial effusion. Aortic valve replacement noted. Mildly enlarged ascending aorta measures up to 3.7 cm. No pulmonary artery embolus identified. Mediastinum/Nodes: No hilar or mediastinal adenopathy. Calcified right hilar granuloma. Esophagus is grossly unremarkable. No mediastinal fluid collection. Lungs/Pleura: Mild diffuse hazy density throughout the lungs, likely atelectatic changes or areas of air trapping. No focal consolidation, pleural effusion, or pneumothorax. The central airways are patent. Upper Abdomen: Cholecystectomy. Apparent fatty infiltration of the liver. Musculoskeletal: No acute osseous pathology. Review of the MIP images confirms the above findings. IMPRESSION: 1. No acute intrathoracic pathology. No  CT evidence of pulmonary artery embolus. 2. Status post TAVR.  No mediastinal or pericardial fluid. Electronically Signed   By: Anner Crete M.D.   On: 04/19/2019 11:34   Dg Chest Port 1 View  Result Date: 04/19/2019 CLINICAL DATA:  63 year old female with a history of back pain, status post TAVR EXAM: PORTABLE CHEST 1 VIEW COMPARISON:  04/17/2019 FINDINGS: Cardiomediastinal silhouette unchanged in size and contour. Changes of TAVR. Coarsened interstitial markings persist with reticular opacities at the lung bases. No pneumothorax or pleural effusion. No confluent airspace disease. IMPRESSION: Similar appearance of chronic lung changes without evidence of acute cardiopulmonary disease. Status post TAVR Electronically Signed   By: Corrie Mckusick D.O.   On: 04/19/2019 11:31   Dg Chest Port 1 View  Result Date: 04/17/2019 CLINICAL DATA:  Post TAVR EXAM: PORTABLE CHEST 1 VIEW COMPARISON:  04/13/2019 FINDINGS: Changes of aortic valve repair. Heart is borderline in size. No confluent airspace opacities or effusions. No pneumothorax. No acute bony abnormality. IMPRESSION: No acute cardiopulmonary disease. Electronically Signed   By: Rolm Baptise M.D.   On: 04/17/2019 19:18    Pending Labs Unresulted Labs (From admission, onward)    Start     Ordered   04/20/19 0500  CBC  (enoxaparin (LOVENOX)    CrCl >/= 30 ml/min)  Daily,   R    Comments: Baseline for enoxaparin therapy IF NOT ALREADY DRAWN.  Notify MD if PLT < 100 K.   Question:  Specimen collection method  Answer:  IV Team=IV Team collect   04/19/19 1326   04/20/19 [id number]  Basic metabolic panel  Daily,   R    Question:  Specimen collection method  Answer:  IV Team=IV Team collect   04/19/19 1326   04/19/19 0837  SARS CORONAVIRUS 2 (TAT 6-24 HRS) Nasopharyngeal Nasopharyngeal Swab  (Asymptomatic/Tier 2 Patients Labs)  Once,   STAT    Question Answer Comment  Is this test for diagnosis or screening Screening   Symptomatic for COVID-19 as defined  by CDC No   Hospitalized for COVID-19 No   Admitted to ICU for COVID-19 No   Previously tested for COVID-19 Yes   Resident in a congregate (group) care setting No  Employed in healthcare setting No   Pregnant No      04/19/19 0837          Vitals/Pain Today's Vitals   04/19/19 1134 04/19/19 1200 04/19/19 1230 04/19/19 1335  BP:  136/69 (!) 131/53 113/66  Pulse: 68 (!) 58 65 64  Resp: 15 11 (!) 23 (!) 22  Temp:      TempSrc:      SpO2: 100% 99% 99% 100%  Weight:      Height:      PainSc:        Isolation Precautions No active isolations  Medications Medications  perflutren lipid microspheres (DEFINITY) IV suspension (2 mLs Intravenous Given 04/19/19 0946)  aspirin chewable tablet 81 mg (81 mg Oral Not Given 04/19/19 1334)  ALPRAZolam (XANAX) tablet 0.5 mg (has no administration in time range)  traZODone (DESYREL) tablet 100 mg (has no administration in time range)  venlafaxine XR (EFFEXOR-XR) 24 hr capsule 75 mg (has no administration in time range)  docusate sodium (COLACE) capsule 100 mg (100 mg Oral Refused 04/19/19 1336)  pantoprazole (PROTONIX) EC tablet 40 mg (40 mg Oral Given 04/19/19 1336)  vitamin B-12 (CYANOCOBALAMIN) tablet 250 mcg (has no administration in time range)  clopidogrel (PLAVIX) tablet 75 mg (has no administration in time range)  gabapentin (NEURONTIN) capsule 300 mg (300 mg Oral Given 04/19/19 1335)  Cholecalciferol 1,000 Units (has no administration in time range)  ondansetron (ZOFRAN) injection 4 mg (has no administration in time range)  enoxaparin (LOVENOX) injection 40 mg (40 mg Subcutaneous Given 04/19/19 1336)  acetaminophen (TYLENOL) tablet 650 mg (650 mg Oral Given 04/19/19 1130)  iohexol (OMNIPAQUE) 350 MG/ML injection 80 mL (80 mLs Intravenous Contrast Given 04/19/19 1122)    Mobility power wheelchair Low fall risk   Focused Assessments Cardiac Assessment Handoff:    No results found for: CKTOTAL, CKMB, CKMBINDEX, TROPONINI Lab  Results  Component Value Date   DDIMER 0.55 (H) 03/04/2013   Does the Patient currently have chest pain? No     R Recommendations: See Admitting Provider Note  Report given to:   Additional Notes:

## 2019-04-19 NOTE — ED Notes (Signed)
Report given to Katharine Look, RN on 4E

## 2019-04-19 NOTE — ED Provider Notes (Signed)
[facility] EMERGENCY DEPARTMENT Provider Note   CSN: [id number] Arrival date & time: 04/19/19  [id number]     History   Chief Complaint Chief Complaint  Patient presents with   Back Pain    HPI DESTENE BARTEN is a 63 y.o. female.  She had aortic valve replacement surgery TAVR done a few days ago and was discharged yesterday.  She woke up this morning with moderate to severe pain in between her shoulder blades and a little bit of pain in her chest.  It seemed to be worse with position.  Not worse with deep breath.  No shortness of breath diaphoresis nausea vomiting.  No known trauma.  No fevers or chills.     The history is provided by the patient.  Back Pain Location:  Thoracic spine Quality:  Aching Pain severity:  Severe (8/10) Pain is:  Same all the time Onset quality:  Sudden Duration:  2 hours Timing:  Constant Progression:  Unchanged Chronicity:  New Context: not recent injury   Relieved by:  None tried Worsened by:  Sitting Ineffective treatments:  None tried Associated symptoms: chest pain and weakness (LLE)   Associated symptoms: no abdominal pain, no bladder incontinence, no dysuria, no fever and no headaches   Risk factors: recent surgery     Past Medical History:  Diagnosis Date   Anxiety    Arthritis    low back   Cervical pain (neck)    Coronary artery disease    Depression    Diverticulitis    GERD (gastroesophageal reflux disease)    Hyperlipidemia    Insomnia    Morbid obesity (HCC)    OSA on CPAP    Post-polio syndrome    With left leg paralysis   S/P TAVR (transcatheter aortic valve replacement)    Severe aortic stenosis    Sigmoid diverticulosis     Patient Active Problem List   Diagnosis Date Noted   Acute on chronic diastolic heart failure ([place]) 04/17/2019   S/P TAVR (transcatheter aortic valve replacement) 04/17/2019   Post-polio syndrome    Morbid obesity (HCC)    OSA on CPAP    Severe  aortic stenosis    Hyperlipidemia    GERD (gastroesophageal reflux disease)    Depression with anxiety    Obesity (BMI 35.0-39.9 without comorbidity) 04/12/2013    Past Surgical History:  Procedure Laterality Date   ANKLE FUSION     left   APPENDECTOMY     CHOLECYSTECTOMY     EYE SURGERY Bilateral 2020   cataracts removed August and September 2020   JOINT REPLACEMENT Right 2003   three replacements; 2003 is last one   KNEE ARTHROPLASTY     LEG SURGERY     muscle surgery related to polio   RECTAL PROLAPSE REPAIR  2020   pessary placed   RIGHT/LEFT HEART CATH AND CORONARY ANGIOGRAPHY N/A 04/02/2019   Procedure: RIGHT/LEFT HEART CATH AND CORONARY ANGIOGRAPHY;  Surgeon: Nelva Bush, MD;  Location: [facility];  Service: Cardiovascular;  Laterality: N/A;   TEE WITHOUT CARDIOVERSION N/A 04/17/2019   Procedure: TRANSESOPHAGEAL ECHOCARDIOGRAM (TEE);  Surgeon: Sherren Mocha, MD;  Location: [facility];  Service: Open Heart Surgery;  Laterality: N/A;   TOTAL HIP ARTHROPLASTY     right x 3   TRANSCATHETER AORTIC VALVE REPLACEMENT, TRANSFEMORAL N/A 04/17/2019   Procedure: TRANSCATHETER AORTIC VALVE REPLACEMENT, TRANSFEMORAL;  Surgeon: Sherren Mocha, MD;  Location: [place] [facility];  Service:  Open Heart Surgery;  Laterality: N/A;   UMBILICAL HERNIA REPAIR       OB History   No obstetric history on file.      Home Medications    Prior to Admission medications   Medication Sig Start Date End Date Taking? Authorizing Provider  ALPRAZolam Duanne Moron) 0.5 MG tablet Take 0.5 mg by mouth 2 (two) times daily as needed for sleep.     [provider]  aspirin 81 MG chewable tablet Chew 1 tablet (81 mg total) by mouth daily. 04/19/19   Eileen Stanford, PA-C  Cholecalciferol (D3 HIGH POTENCY) 1000 UNITS capsule Take 1,000 Units by mouth daily.    [provider]  clopidogrel (PLAVIX) 75 MG tablet Take 1 tablet (75 mg total) by mouth daily  with breakfast. 04/19/19   Eileen Stanford, PA-C  docusate sodium (COLACE) 100 MG capsule Take 100 mg by mouth daily.    [provider]  gabapentin (NEURONTIN) 300 MG capsule Take 300 mg by mouth daily.    [provider]  pantoprazole (PROTONIX) 40 MG tablet Take 40 mg by mouth daily.    [provider]  traZODone (DESYREL) 100 MG tablet Take 100 mg by mouth at bedtime.     [provider]  venlafaxine XR (EFFEXOR-XR) 75 MG 24 hr capsule Take 75 mg by mouth daily with breakfast.    [provider]  vitamin B-12 (CYANOCOBALAMIN) 250 MCG tablet Take 250 mcg by mouth daily.    [provider]  loratadine (CLARITIN) 10 MG tablet Take 1 tablet (10 mg total) by mouth daily. Patient not taking: Reported on 03/31/2019 09/25/17 04/06/19  Horton, Barbette Hair, MD  sodium chloride (OCEAN) 0.65 % SOLN nasal spray Place 1 spray into both nostrils as needed for congestion. Patient not taking: Reported on 03/31/2019 09/25/17 04/06/19  Horton, Barbette Hair, MD    Family History Family History  Problem Relation Age of Onset   Cancer Mother    Heart disease Father    CAD Other        FAM Hx OF CAD   Diabetes Other        FAM Hx of DM   Hypertension Other     Social History Social History   Tobacco Use   Smoking status: Never Smoker   Smokeless tobacco: Never Used  Substance Use Topics   Alcohol use: No   Drug use: Never     Allergies   Amoxicillin, Flagyl [metronidazole], Plaquenil [hydroxychloroquine sulfate], Codeine, Doxycycline, Other, Percocet [oxycodone-acetaminophen], and Sulfa antibiotics   Review of Systems Review of Systems  Constitutional: Negative for fever.  HENT: Negative for sore throat.   Eyes: Negative for visual disturbance.  Respiratory: Negative for shortness of breath.   Cardiovascular: Positive for chest pain.  Gastrointestinal: Negative for abdominal pain.  Genitourinary: Negative for bladder  incontinence and dysuria.  Musculoskeletal: Positive for back pain and gait problem (chronic). Negative for neck pain.  Skin: Negative for rash.  Neurological: Positive for weakness (LLE). Negative for headaches.     Physical Exam Updated Vital Signs BP (!) 116/57 (BP Location: Left Arm)    Pulse 64    Temp 97.9 F (36.6 C) (Oral)    Resp 18    Ht 5\' 8"  (1.727 m)    Wt 122 kg    SpO2 100%    BMI 40.90 kg/m   Physical Exam Vitals signs and nursing note reviewed.  Constitutional:      General: She  is not in acute distress.    Appearance: She is well-developed.  HENT:     Head: Normocephalic and atraumatic.  Eyes:     Conjunctiva/sclera: Conjunctivae normal.  Neck:     Musculoskeletal: Neck supple.  Cardiovascular:     Rate and Rhythm: Normal rate and regular rhythm.     Pulses: Normal pulses.     Heart sounds: No murmur.  Pulmonary:     Effort: Pulmonary effort is normal. No respiratory distress.     Breath sounds: Normal breath sounds.  Abdominal:     Palpations: Abdomen is soft.     Tenderness: There is no abdominal tenderness.  Musculoskeletal: Normal range of motion.        General: No tenderness.  Skin:    General: Skin is warm and dry.     Capillary Refill: Capillary refill takes less than 2 seconds.  Neurological:     Mental Status: She is alert. Mental status is at baseline.      ED Treatments / Results  Labs (all labs ordered are listed, but only abnormal results are displayed) Labs Reviewed  BASIC METABOLIC PANEL - Abnormal; Notable for the following components:      Result Value   Glucose, Bld 112 (*)    BUN 7 (*)    Calcium 8.5 (*)    All other components within normal limits  TROPONIN I (HIGH SENSITIVITY) - Abnormal; Notable for the following components:   Troponin I (High Sensitivity) 358 (*)    All other components within normal limits  TROPONIN I (HIGH SENSITIVITY) - Abnormal; Notable for the following components:   Troponin I (High  Sensitivity) 349 (*)    All other components within normal limits  SARS CORONAVIRUS 2 (TAT 6-24 HRS)  CBC  CBC  BASIC METABOLIC PANEL    EKG EKG Interpretation  Date/Time:  Thursday April 19 2019 08:14:01 EST Ventricular Rate:  70 PR Interval:    QRS Duration: 99 QT Interval:  432 QTC Calculation: 467 R Axis:   1 Text Interpretation: Age not entered, assumed to be  63 years old for purpose of ECG interpretation Sinus rhythm Abnormal R-wave progression, early transition LVH with secondary repolarization abnormality No significant change since yesterday Confirmed by Aletta Edouard [phone]) on 04/19/2019 8:16:49 AM   Radiology Ct Angio Chest Pe W/cm &/or Wo Cm  Result Date: 04/19/2019 CLINICAL DATA:  63 year old female with chest pain. Status post aortic valve replacement. EXAM: CT ANGIOGRAPHY CHEST WITH CONTRAST TECHNIQUE: Multidetector CT imaging of the chest was performed using the standard protocol during bolus administration of intravenous contrast. Multiplanar CT image reconstructions and MIPs were obtained to evaluate the vascular anatomy. CONTRAST:  26mL OMNIPAQUE IOHEXOL 350 MG/ML SOLN COMPARISON:  Chest radiograph dated 04/19/2019 and CT dated 03/31/2019. FINDINGS: Cardiovascular: Top-normal cardiac size. No pericardial effusion. Aortic valve replacement noted. Mildly enlarged ascending aorta measures up to 3.7 cm. No pulmonary artery embolus identified. Mediastinum/Nodes: No hilar or mediastinal adenopathy. Calcified right hilar granuloma. Esophagus is grossly unremarkable. No mediastinal fluid collection. Lungs/Pleura: Mild diffuse hazy density throughout the lungs, likely atelectatic changes or areas of air trapping. No focal consolidation, pleural effusion, or pneumothorax. The central airways are patent. Upper Abdomen: Cholecystectomy. Apparent fatty infiltration of the liver. Musculoskeletal: No acute osseous pathology. Review of the MIP images confirms the above findings.  IMPRESSION: 1. No acute intrathoracic pathology. No CT evidence of pulmonary artery embolus. 2. Status post TAVR.  No mediastinal or pericardial fluid. Electronically Signed  By: Anner Crete M.D.   On: 04/19/2019 11:34   Dg Chest Port 1 View  Result Date: 04/19/2019 CLINICAL DATA:  63 year old female with a history of back pain, status post TAVR EXAM: PORTABLE CHEST 1 VIEW COMPARISON:  04/17/2019 FINDINGS: Cardiomediastinal silhouette unchanged in size and contour. Changes of TAVR. Coarsened interstitial markings persist with reticular opacities at the lung bases. No pneumothorax or pleural effusion. No confluent airspace disease. IMPRESSION: Similar appearance of chronic lung changes without evidence of acute cardiopulmonary disease. Status post TAVR Electronically Signed   By: Corrie Mckusick D.O.   On: 04/19/2019 11:31    Procedures Procedures (including critical care time)  Medications Ordered in ED Medications - No data to display   Initial Impression / Assessment and Plan / ED Course  I have reviewed the triage vital signs and the nursing notes.  Pertinent labs & imaging results that were available during my care of the patient were reviewed by me and considered in my medical decision making (see chart for details).  Clinical Course as of Apr 19 1903  Thu Apr 19, 2019  [id number] Cardiology consulted.  63 year old status post TAVR done 2 days ago here with interscapular back pain.  Afebrile and sats good.  EKG LVH with a lot of ST depressions and T wave inversions but similar to prior EKG.  Good pulses.  Differential includes PE, dissection, tamponade, ACS, musculoskeletal.    [MB]  [id number] Discussed with PA Grandville Silos.  Patient well-known to her.  She said the patient had this tearing intrascapular pain which is what got her work-up to eventually get a TAVR.  She agrees with current work-up of getting a CT and she is putting her in for an echo and cardiology will be admitting service.   [MB]    [id number] Chest x-ray interpreted by me as widened mediastinum likely due to rotation, no pneumothorax.  Awaiting radiology reading.   [MB]  [id number] The nurses put in for a IV team consult due to unable to place a peripheral IV.   [MB]  [id number] Troponin critically elevated at 358.   [MB]  [id number] Cardiology aware of troponin.   [MB]  69 CT showing no acute pathology.  Awaiting delta troponin.  Cardiology said they may consider sending her home if her work-up is negative.   [MB]  1202 Delta troponin unchanged.  Will reconnect with cardiology regarding disposition.   [MB]    Clinical Course User Index [MB] Hayden Rasmussen, MD         Final Clinical Impressions(s) / ED Diagnoses   Final diagnoses:  Interscapular pain  Nonspecific chest pain    ED Discharge Orders    None       Hayden Rasmussen, MD 04/19/19 1905

## 2019-04-19 NOTE — Progress Notes (Signed)
  Echocardiogram 2D Echocardiogram limited with definity has been performed.  Darlina Sicilian M 04/19/2019, 9:45 AM

## 2019-04-19 NOTE — ED Notes (Signed)
Patient transported to CT 

## 2019-04-19 NOTE — ED Triage Notes (Signed)
Pt BIB by [facility]. Pt had aortic valve replacement surgery here and sent home yesterday. Woke this am with acute onset of extreme pain to center of her back radiating through to her chest. Pt took 324 mg ASA prior to ems arrival

## 2019-04-19 NOTE — H&P (Addendum)
Cardiology Admission History and Physical:   Patient ID: [name] MRN: [id number]; DOB: Oct 19, 1955   Admission date: 04/19/2019  Primary Care Provider: [name], MD Primary Cardiologist: Minus Breeding, MD   Chief Complaint:  Chest and scapular pain.   Patient Profile:   [name] is a 63 y.o. 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    History of Present Illness:   [name] lives alone in [place].  She has 5 siblings and many nieces and nephews that live locally.  The patient suffered from polio at 16 months old and has had subsequent orthopedic issues and has been wheelchair-bound since 2000.  Despite this, she has remained very active and totally independent.  She drives a car and takes care of all of her own ADLs. She is able to transfer herself from her wheelchair independently. 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  Over the past year she has slowed down quite a bit due to worsening fatigue and stamina.  She previously was followed by Dr. Percival Spanish fo LE edema. Echo 12/2013 showed EF 55-60% with mild-mod AS (mean gradient 24 mm Hg, AVA 1.8cm2) although study was technically difficult.   She was recently admitted from 10/16-10/20/20 for chest and back pain. In the ER, EKG showed sinus with lateral and anterolateral ST-T wave abnormalities, more prominent anterolaterally compared to last tracings. High-sensitivity troponin 41---> 46--> 50--> 51 --> 49. CT angio chest/abdomen/pelvis showed no acute findings.Incidentally, she  was noted to have thoracoabdominal atherosclerosis and valvular calcifications. Echo showed critical AS with EF 55%, mild LVH, severe AS with mean gradient 71.8 mm Hg, peak gradient 105 mm Hg, DVI .014, mod AI. Due to poor image quality, TEE was recommended. [facility] 04/02/19 showed normal cors, severe aortic stenosis (mean gradient 65 mmHg, calculated valve area 0.5 cm^2), moderately to severely elevated left ventricular filling pressure, mildly elevated right and pulmonary artery pressures and moderately reduced cardiac output. She was evaluated by the multidisciplinary valve team and underwent TAVR work up with pre TAVR CT scans during her admission.   She was brought back on 04/17/19 and underwent successful BAV and TAVR with a 26 mm Edwards Sapien 3 Ultra THV via the TF approach. Post op echo showed EF 60-65%, normally functioning TAVR with mean gradient of 16.7 mm Hg and no PVL. She was discharged home on POD 1 (04/18/19) on aspirin and plavix.   She went home and was doing well until this AM when she developed severe pain between her shoulder blades with some radiation to the chest. She presented to [facility] for [facility] evaluation. ECG today shows sinus with diffuse TWI similar to previous tracings but now more dynamic.  HS troponin 358. BMET and CBC WNL. Stat limited echo shows normal LV function without wall motion abnormality or pericardial effusion (formal read pending). CT angio pending.  She is currently lying in bed and tearful. She was worried she came to the ER unnecessarily. No more chest pain currently. She did have back pain when she turned over for examination. No edema, orthopnea or PND. No dizziness or syncope. No blood in her stool or urine.  Past Medical History:  Diagnosis Date  . Anxiety   . Arthritis    low back  . Cervical pain (neck)   . Coronary artery disease   . Depression   . Diverticulitis   . GERD (gastroesophageal reflux disease)   . Hyperlipidemia   . Insomnia   .  Morbid obesity ([place])   . OSA on CPAP   . Post-polio syndrome    With left leg paralysis  . S/P TAVR (transcatheter aortic valve replacement)   . Severe aortic stenosis   . Sigmoid diverticulosis     Past Surgical History:  Procedure Laterality Date  . ANKLE FUSION     left  . APPENDECTOMY    . CHOLECYSTECTOMY    . EYE SURGERY Bilateral 2020   cataracts removed August and September 2020  . JOINT REPLACEMENT Right 2003   three replacements; 2003 is last one  . KNEE ARTHROPLASTY    . LEG SURGERY     muscle surgery related to polio  . RECTAL PROLAPSE REPAIR  2020   pessary placed  . RIGHT/LEFT HEART CATH AND CORONARY ANGIOGRAPHY N/A 04/02/2019   Procedure: RIGHT/LEFT HEART CATH AND CORONARY ANGIOGRAPHY;  Surgeon: Nelva Bush, MD;  Location: [place] [facility];  Service: Cardiovascular;  Laterality: N/A;  . TEE WITHOUT CARDIOVERSION N/A 04/17/2019   Procedure: TRANSESOPHAGEAL ECHOCARDIOGRAM (TEE);  Surgeon: Sherren Mocha, MD;  Location: [place] [facility];  Service: Open Heart Surgery;  Laterality: N/A;  . TOTAL HIP ARTHROPLASTY     right x 3  . TRANSCATHETER AORTIC VALVE REPLACEMENT, TRANSFEMORAL N/A 04/17/2019   Procedure: TRANSCATHETER AORTIC VALVE REPLACEMENT, TRANSFEMORAL;  Surgeon: Sherren Mocha, MD;  Location: [place] [facility];  Service: Open Heart Surgery;  Laterality: N/A;  . UMBILICAL HERNIA REPAIR       Medications Prior to Admission: Prior to Admission medications   Medication Sig Start Date End Date Taking? Authorizing Provider  ALPRAZolam Duanne Moron) 0.5 MG tablet Take 0.5 mg by mouth 2 (two) times daily as needed for sleep.     [provider]  aspirin 81 MG chewable tablet Chew 1 tablet (81 mg total) by mouth daily. 04/19/19   Eileen Stanford, PA-C  Cholecalciferol (D3 HIGH POTENCY) 1000 UNITS capsule Take 1,000 Units by mouth daily.    [provider]  clopidogrel (PLAVIX) 75 MG tablet Take 1 tablet (75 mg total) by mouth daily with  breakfast. 04/19/19   Eileen Stanford, PA-C  docusate sodium (COLACE) 100 MG capsule Take 100 mg by mouth daily.    [provider]  gabapentin (NEURONTIN) 300 MG capsule Take 300 mg by mouth daily.    [provider]  pantoprazole (PROTONIX) 40 MG tablet Take 40 mg by mouth daily.    [provider]  traZODone (DESYREL) 100 MG tablet Take 100 mg by mouth at bedtime.     [provider]  venlafaxine XR (EFFEXOR-XR) 75 MG 24 hr capsule Take 75 mg by mouth daily with breakfast.    [provider]  vitamin B-12 (CYANOCOBALAMIN) 250 MCG tablet Take 250 mcg by mouth daily.    [provider]  loratadine (CLARITIN) 10 MG tablet Take 1 tablet (10 mg total) by mouth daily. Patient not taking: Reported on 03/31/2019 09/25/17 04/06/19  Horton, Barbette Hair, MD  sodium chloride (OCEAN) 0.65 % SOLN nasal spray Place 1 spray into both nostrils as needed for congestion. Patient not taking: Reported on 03/31/2019 09/25/17 04/06/19  Horton, Barbette Hair, MD  Allergies:    Allergies  Allergen Reactions  . Amoxicillin Anaphylaxis and Swelling  . Flagyl [Metronidazole] Anaphylaxis  . Plaquenil [Hydroxychloroquine Sulfate] Other (See Comments)    Blurred vision, joint pain  . Codeine Nausea Only  . Doxycycline Nausea And Vomiting  . Other Nausea Only    Tylenol with Codeine #3  . Percocet [Oxycodone-Acetaminophen] Nausea Only  . Sulfa Antibiotics Nausea Only    Social History:   Social History   Socioeconomic History  . Marital status: Single    Spouse name: Not on file  . Number of children: Not on file  . Years of education: Not on file  . Highest education level: Not on file  Occupational History  . Not on file  Social Needs  . Financial resource strain: Not on file  . Food insecurity    Worry: Not on file    Inability: Not on file  . Transportation needs    Medical: Not on file    Non-medical: Not on file  Tobacco Use  . Smoking  status: Never Smoker  . Smokeless tobacco: Never Used  Substance and Sexual Activity  . Alcohol use: No  . Drug use: Never  . Sexual activity: Not on file  Lifestyle  . Physical activity    Days per week: Not on file    Minutes per session: Not on file  . Stress: Not on file  Relationships  . Social Herbalist on phone: Not on file    Gets together: Not on file    Attends religious service: Not on file    Active member of club or organization: Not on file    Attends meetings of clubs or organizations: Not on file    Relationship status: Not on file  . Intimate partner violence    Fear of current or ex partner: Not on file    Emotionally abused: Not on file    Physically abused: Not on file    Forced sexual activity: Not on file  Other Topics Concern  . Not on file  Social History Narrative   Lives alone.    Family History:   The patient's family history includes CAD in an other family member; Cancer in her mother; Diabetes in an other family member; Heart disease in her father; Hypertension in an other family member.    ROS:  Please see the history of present illness.  All other ROS reviewed and negative.     Physical Exam/Data:   Vitals:   04/19/19 0831 04/19/19 1100 04/19/19 1132 04/19/19 1134  BP:  135/84 (!) 153/68   Pulse:  67  68  Resp:  (!) 24 (!) 23 15  Temp:      TempSrc:      SpO2:  95%  100%  Weight: 122 kg     Height: 5\' 8"  (1.727 m)      No intake or output data in the 24 hours ending 04/19/19 1205 Last 3 Weights 04/19/2019 04/18/2019 04/17/2019  Weight (lbs) 269 lb 275 lb 5.7 oz 262 lb  Weight (kg) 122.018 kg 124.9 kg 118.842 kg     Body mass index is 40.9 kg/m.  General:  Well nourished, well developed, in no acute distress, obese HEENT: normal Lymph: no adenopathy Neck: no JVD Endocrine:  No thryomegaly Vascular: No carotid bruits; FA pulses 2+ bilaterally without bruits  Cardiac:  normal S1, S2; RRR; soft flow murmur Lungs:  clear  to auscultation bilaterally, no wheezing,  rhonchi or rales  Abd: soft, nontender, no hepatomegaly  Ext: no edema Musculoskeletal:  No deformities, BUE and BLE strength normal and equal Skin: warm and dry  Neuro:  CNs 2-12 intact, no focal abnormalities noted Psych:  Normal affect    EKG:  The ECG that was done was personally reviewed and demonstrates sinus with TWI in I, II, AVF, AVL and V2-V6. Similar to previous tracings but TWIs more dynamic   Relevant CV Studies:  Echo 03/31/19 IMPRESSIONS 1. Limited images. 2. Left ventricular ejection fraction, by visual estimation, is 55%. The left ventricle has normal function. There is mildly increased left ventricular hypertrophy. 3. Definity contrast agent was given IV to delineate the left ventricular endocardial borders. 4. Left ventricular diastolic Doppler parameters are consistent with impaired relaxation pattern of LV diastolic filling. 5. Global right ventricle was not well visualized.The right ventricular size is not well visualized. Right vetricular wall thickness was not assessed. 6. Left atrial size was normal. 7. Right atrial size was normal. 8. Mild mitral annular calcification. 9. The mitral valve is grossly normal. No evidence of mitral valve regurgitation. 10. The tricuspid valve is not well visualized. Tricuspid valve regurgitation is trivial. 11. Aortic valve mean gradient measures 71.8 mmHg. 12. The aortic valve was not well visualized but is calcified with restricted motion. Aortic valve regurgitation is mild by color flow Doppler. Severe aortic valve stenosis is suggested by gradients. Suggest TEE to further assess aortic valve structure. 13. The pulmonic valve was not well visualized. Pulmonic valve regurgitation was not assessed by color flow Doppler. 14. Aortic dilatation noted. 15. There is mild dilatation of the ascending aorta. 16. The inferior vena cava is normal in size with greater than 50% respiratory  variability, suggesting right atrial pressure of 3 mmHg. 17. The interatrial septum was not well visualized. 18. TR signal is inadequate for assessing pulmonary artery systolic pressure.  FINDINGS Left Ventricle: Left ventricular ejection fraction, by visual estimation, is 55%. The left ventricle has normal function. Definity contrast agent was given IV to delineate the left ventricular endocardial borders. There is mildly increased left  ventricular hypertrophy. Spectral Doppler shows Left ventricular diastolic Doppler parameters are consistent with impaired relaxation pattern of LV diastolic filling.  Right Ventricle: The right ventricular size is not well visualized. Right vetricular wall thickness was not assessed. Global RV systolic function is was not well visualized.  Left Atrium: Left atrial size was normal in size.  Right Atrium: Right atrial size was normal in size  Pericardium: There is no evidence of pericardial effusion.  Mitral Valve: The mitral valve is grossly normal. Mild mitral annular calcification. No evidence of mitral valve regurgitation.  Tricuspid Valve: The tricuspid valve is not well visualized. Tricuspid valve regurgitation is trivial by color flow Doppler.  Aortic Valve: The aortic valve was not well visualized. Aortic valve regurgitation is mild by color flow Doppler. Severe aortic stenosis is present. Aortic valve mean gradient measures 71.8 mmHg. Aortic valve peak gradient measures 105.0 mmHg.  Pulmonic Valve: The pulmonic valve was not well visualized. Pulmonic valve regurgitation was not assessed by color flow Doppler.  Aorta: Aortic dilatation noted. There is mild dilatation of the ascending aorta.  Venous: The inferior vena cava is normal in size with greater than 50% respiratory variability, suggesting right atrial pressure of 3 mmHg.  IAS/Shunts: The interatrial septum was not well visualized.     Diastology LV e' lateral: 3.57  cm/s LV E/e' lateral: 16.6 LV e' medial: 4.26 cm/s  LV E/e' medial: 13.9   LEFT ATRIUM Index LA Vol (A4C): 52.3 ml 23.27 ml/m AORTIC VALVE AV Vmax: 512.40 cm/s AV Vmean: 408.600 cm/s AV VTI: 1.300 m AV Peak Grad: 105.0 mmHg AV Mean Grad: 71.8 mmHg LVOT Vmax: 73.10 cm/s LVOT Vmean: 53.900 cm/s LVOT VTI: 0.186 m LVOT/AV VTI ratio: 0.14  MITRAL VALVE MV Area (PHT): 2.26 cm SHUNTS MV PHT: 97.15 msec Systemic VTI: 0.19 m MV Decel Time: 335 msec MV E velocity: 59.10 cm/s 103 cm/s MV A velocity: 101.00 cm/s 70.3 cm/s MV E/A ratio: 0.59 1.5  ____________________   [facility] 04/02/19 Conclusions: 1. No angiographically significant CAD. 2. Severe aortic stenosis (mean gradient 65 mmHg, calculated valve area 0.5 cm^2). 3. Moderately to severely elevated left ventricular filling pressure. 4. Mildly elevated right and pulmonary artery pressures. 5. Moderately reduced cardiac output.  Recommendations: 1. Medical therapy, including careful blood pressure control and diuresis. 2. Valve team consultation for further workup/management of severe aortic stenosis.  ____________________    TAVR OPERATIVE NOTE   Date of Procedure:04/17/2019  Preoperative Diagnosis:Severe Aortic Stenosis   Postoperative Diagnosis:Same   Procedure:   Transcatheter Aortic Valve Replacement - PercutaneousRightTransfemoral Approach Edwards Sapien 3 Ultra THV (size 46mm, model # [id number], serial # [id number])  Co-Surgeons:Bryan Alveria Apley, MD and Sherren Mocha, MD   Anesthesiologist:G. Kalman Shan, MD  Anne Hahn, MD  Pre-operative Echo Findings: ? Severe aortic stenosis ? Normalleft ventricular systolic function  Post-operative Echo  Findings: ? Noparavalvular leak ? Normalleft ventricular systolic function  _____________   Echo 04/18/19: IMPRESSION  1. Left ventricular ejection fraction, by visual estimation, is 60 to 65%. The left ventricle has normal function. There is no left ventricular hypertrophy.  2. Left ventricular diastolic parameters are consistent with Grade II diastolic dysfunction (pseudonormalization).  3. Elevated left ventricular end-diastolic pressure.  4. Global right ventricle has normal systolic function.The right ventricular size is normal. No increase in right ventricular wall thickness.  5. Left atrial size was mildly dilated.  6. Right atrial size was normal.  7. The mitral valve is normal in structure. No evidence of mitral valve regurgitation. No evidence of mitral stenosis.  8. The tricuspid valve is normal in structure. Tricuspid valve regurgitation is not demonstrated.  9. 26 Edwards Sapien bioprosthetic, stented aortic valve (TAVR) valve is present in the aortic position. Perivalvular Aortic valve regurgitation is not visualized. Aortic valve mean gradient measures 16.7 mmHg. Aortic valve peak gradient measures 30.7  mmHg. Dimensionless index 0.54 and peak velocity 277cm/sec. 10. The pulmonic valve was normal in structure. Pulmonic valve regurgitation is not visualized. 11. The inferior vena cava is normal in size with greater than 50% respiratory variability, suggesting right atrial pressure of 3 mmHg. 12. TR signal is inadequate for assessing pulmonary artery systolic pressure.   Laboratory Data:  High Sensitivity Troponin:   Recent Labs  Lab 03/31/19 0607 03/31/19 0850 03/31/19 1113 04/19/19 0859 04/19/19 1052  TROPONINIHS 50* 51* 49* 358* 349*      Chemistry Recent Labs  Lab 04/13/19 1430  04/17/19 1634 04/19/19 0859  NA 139   < > 140 139  K 4.2   < > 4.0 3.8  CL 107   < > 105 106  CO2 22  --   --  24  GLUCOSE 81   < > 129* 112*  BUN 14   < > 13 7*  CREATININE  0.72   < > 0.50 0.74  CALCIUM 9.0  --   --  8.5*  GFRNONAA >  60  --   --  >60  GFRAA >60  --   --  >60  ANIONGAP 10  --   --  9   < > = values in this interval not displayed.    Recent Labs  Lab 04/13/19 1430  PROT 7.4  ALBUMIN 3.8  AST 22  ALT 16  ALKPHOS 84  BILITOT 0.5   Hematology Recent Labs  Lab 04/13/19 1430  04/17/19 1634 04/19/19 0859  WBC 7.2  --   --  7.7  RBC 4.63  --   --  4.09  HGB 14.5   < > 11.9* 12.9  HCT 43.7   < > 35.0* 39.1  MCV 94.4  --   --  95.6  MCH 31.3  --   --  31.5  MCHC 33.2  --   --  33.0  RDW 13.2  --   --  13.3  PLT 312  --   --  195   < > = values in this interval not displayed.   BNP Recent Labs  Lab 04/13/19 1416  BNP 152.1*    DDimer No results for input(s): DDIMER in the last 168 hours.   Radiology/Studies:  Ct Angio Chest Pe W/cm &/or Wo Cm  Result Date: 04/19/2019 CLINICAL DATA:  63 year old female with chest pain. Status post aortic valve replacement. EXAM: CT ANGIOGRAPHY CHEST WITH CONTRAST TECHNIQUE: Multidetector CT imaging of the chest was performed using the standard protocol during bolus administration of intravenous contrast. Multiplanar CT image reconstructions and MIPs were obtained to evaluate the vascular anatomy. CONTRAST:  46mL OMNIPAQUE IOHEXOL 350 MG/ML SOLN COMPARISON:  Chest radiograph dated 04/19/2019 and CT dated 03/31/2019. FINDINGS: Cardiovascular: Top-normal cardiac size. No pericardial effusion. Aortic valve replacement noted. Mildly enlarged ascending aorta measures up to 3.7 cm. No pulmonary artery embolus identified. Mediastinum/Nodes: No hilar or mediastinal adenopathy. Calcified right hilar granuloma. Esophagus is grossly unremarkable. No mediastinal fluid collection. Lungs/Pleura: Mild diffuse hazy density throughout the lungs, likely atelectatic changes or areas of air trapping. No focal consolidation, pleural effusion, or pneumothorax. The central airways are patent. Upper Abdomen: Cholecystectomy.  Apparent fatty infiltration of the liver. Musculoskeletal: No acute osseous pathology. Review of the MIP images confirms the above findings. IMPRESSION: 1. No acute intrathoracic pathology. No CT evidence of pulmonary artery embolus. 2. Status post TAVR.  No mediastinal or pericardial fluid. Electronically Signed   By: Anner Crete M.D.   On: 04/19/2019 11:34   Dg Chest Port 1 View  Result Date: 04/19/2019 CLINICAL DATA:  63 year old female with a history of back pain, status post TAVR EXAM: PORTABLE CHEST 1 VIEW COMPARISON:  04/17/2019 FINDINGS: Cardiomediastinal silhouette unchanged in size and contour. Changes of TAVR. Coarsened interstitial markings persist with reticular opacities at the lung bases. No pneumothorax or pleural effusion. No confluent airspace disease. IMPRESSION: Similar appearance of chronic lung changes without evidence of acute cardiopulmonary disease. Status post TAVR Electronically Signed   By: Corrie Mckusick D.O.   On: 04/19/2019 11:31    Assessment and Plan:   Severe back and chest pain: similar to her previous admission where she was found to have critical AS. She had no coronary artery disease on recent heart cath. BP stable at 143/73. ECG with TWIs similar to previous tracings but now more pronounced. First HS troponin 358 which is higher than previous admission but not totally surprising given recent TAVR. Delta HS troponin slightly lower at 349. Stat limited echo shows normal LV function without  wall motion abnormality or pericardial effusion (formal read pending). CXR and CT angio show no acute abnormality. Will admit overnight for observation and serial ECGs. Place on DVT prophylaxis lovenox.   Severe AS s/p TAVR: continue on aspirn and plavix.   Severity of Illness: The appropriate patient status for this patient is OBSERVATION. Observation status is judged to be reasonable and necessary in order to provide the required intensity of service to ensure the patient's  safety. The patient's presenting symptoms, physical exam findings, and initial radiographic and laboratory data in the context of their medical condition is felt to place them at decreased risk for further clinical deterioration. Furthermore, it is anticipated that the patient will be medically stable for discharge from the hospital within 2 midnights of admission. The following factors support the patient status of observation.   " The patient's presenting symptoms include severe chest pain. " The physical exam findings include back and chest pain. " The initial radiographic and laboratory data are stable.     For questions or updates, please contact [place] Please consult [email or web address] for contact info under        Signed, Angelena Form, PA-C  04/19/2019 12:05 PM   I have seen and examined the patient along with Angelena Form, PA-C .  I have reviewed the chart, notes and new data.  I agree with PA/NP's note.  Key new complaints: Deep pressure-like retrosternal discomfort radiating through to her left scapula, present at rest.  Does not have pleuritic qualities. Key examination changes: Normal cardiovascular examination.  I do not hear either systolic or diastolic murmurs in the aortic focus.  The groin access sites look very healthy without any evidence of bruising or hematoma. Key new findings / data: Her ECG shows sinus rhythm with very prominent ST segment depression T wave inversion in leads V3-V6 as well as 1 and aVL.  Similar changes were seen on the postprocedural ECG on November 3.  Less significant changes were present on the ECG from October 17 and interestingly in October 16 there was only minimal T wave inversion in the precordial leads.  Today's chest CT angio does not show any evidence of aortic pathology.  Although not of a coronary study, contrast is clearly seen in both the left and right coronary arteries.  On the pre-TAVR CT both coronaries appear to have  sufficient ostial height roughly 14 mm above the annulus.  Bedside review of echocardiogram shows normal left ventricular regional wall motion and overall systolic function and no evidence of a pericardial effusion.  Mean TAVR gradients of 27 and 60 mmHg respectively are unchanged from the study performed yesterday.  Modest increase in cardiac troponin I shows a plateau pattern.  This suggests that we are dealing with mild myocardial injury probably at the time of her procedure 2 days ago, not an ongoing acute coronary insufficiency event.  PLAN: I am not entirely sure whether Mrs. Cesario is describing angina pectoris or musculoskeletal chest pain, but I believe overnight observation is appropriate.  We will recheck an ECG in the morning.  At this point I see no reason to administer heparin.  Will discuss with the structural heart team.  Sanda Klein, MD, [place] [phone] 04/19/2019, 12:43 PM

## 2019-04-20 ENCOUNTER — Encounter (HOSPITAL_COMMUNITY): Payer: Self-pay | Admitting: General Practice

## 2019-04-20 DIAGNOSIS — B91 Sequelae of poliomyelitis: Secondary | ICD-10-CM | POA: Diagnosis not present

## 2019-04-20 DIAGNOSIS — G4733 Obstructive sleep apnea (adult) (pediatric): Secondary | ICD-10-CM | POA: Diagnosis not present

## 2019-04-20 DIAGNOSIS — R072 Precordial pain: Secondary | ICD-10-CM | POA: Diagnosis not present

## 2019-04-20 DIAGNOSIS — R778 Other specified abnormalities of plasma proteins: Secondary | ICD-10-CM

## 2019-04-20 DIAGNOSIS — G8314 Monoplegia of lower limb affecting left nondominant side: Secondary | ICD-10-CM | POA: Diagnosis not present

## 2019-04-20 DIAGNOSIS — R079 Chest pain, unspecified: Secondary | ICD-10-CM | POA: Diagnosis not present

## 2019-04-20 DIAGNOSIS — Z952 Presence of prosthetic heart valve: Secondary | ICD-10-CM | POA: Diagnosis not present

## 2019-04-20 DIAGNOSIS — M549 Dorsalgia, unspecified: Secondary | ICD-10-CM

## 2019-04-20 LAB — CBC
HCT: 37.9 % (ref 36.0–46.0)
Hemoglobin: 12.5 g/dL (ref 12.0–15.0)
MCH: 31.3 pg (ref 26.0–34.0)
MCHC: 33 g/dL (ref 30.0–36.0)
MCV: 95 fL (ref 80.0–100.0)
Platelets: 217 10*3/uL (ref 150–400)
RBC: 3.99 MIL/uL (ref 3.87–5.11)
RDW: 13.3 % (ref 11.5–15.5)
WBC: 7.3 10*3/uL (ref 4.0–10.5)
nRBC: 0 % (ref 0.0–0.2)

## 2019-04-20 LAB — BASIC METABOLIC PANEL
Anion gap: 9 (ref 5–15)
BUN: 7 mg/dL — ABNORMAL LOW (ref 8–23)
CO2: 24 mmol/L (ref 22–32)
Calcium: 8.6 mg/dL — ABNORMAL LOW (ref 8.9–10.3)
Chloride: 104 mmol/L (ref 98–111)
Creatinine, Ser: 0.69 mg/dL (ref 0.44–1.00)
GFR calc Af Amer: >60 mL/min (ref >60)
GFR calc non Af Amer: >60 mL/min (ref >60)
Glucose, Bld: 109 mg/dL — ABNORMAL HIGH (ref 70–99)
Potassium: 3.7 mmol/L (ref 3.5–5.1)
Sodium: 137 mmol/L (ref 135–145)

## 2019-04-20 LAB — TROPONIN I (HIGH SENSITIVITY): Troponin I (High Sensitivity): 257 ng/L (ref <18)

## 2019-04-20 MED ORDER — CLOPIDOGREL BISULFATE 75 MG PO TABS: 75.0000 mg | ORAL_TABLET | Freq: Every day | ORAL | 1 refills | Status: DC

## 2019-04-20 NOTE — Discharge Summary (Signed)
Discharge Summary    Patient ID: [name] MRN: [id number]; DOB: 02-Mar-1956  Admit date: 04/19/2019 Discharge date: 04/20/2019  Primary Care Provider: [name], MD  Primary Cardiologist: Minus Breeding, MD  Primary Electrophysiologist:  None   Discharge Diagnoses    Principal Problem:   Chest pain Active Problems:   Back pain   S/P TAVR (transcatheter aortic valve replacement)   Obesity (BMI 35.0-39.9 without comorbidity)   Depression with anxiety   Post-polio syndrome   OSA on CPAP    Diagnostic Studies/Procedures    Echocardiogram 04/19/2019: Impressions: 1. Left ventricular ejection fraction, by visual estimation, is 55%. The left ventricle has normal function. There is moderately increased left ventricular hypertrophy.  2. Bioprosthetic aortic valve s/p TAVR. The valve was not well-visualized. I did not see peri-valvular regurgitation. Mean gradient 16 mmHg.  3. Global right ventricle was not well visualized.The right ventricular size is not well visualized. Right vetricular wall thickness was not assessed.  4. Left atrial size was not well visualized.  5. Right atrial size was not well visualized.  6. The interatrial septum was not well visualized.  7. The aortic root was not well visualized.  8. Aortic valve regurgitation is not visualized.  9. The mitral valve was not well visualized. No evidence of mitral valve regurgitation. No evidence of mitral stenosis. 10. The tricuspid valve is not well visualized. 11. The pulmonic valve was not well visualized. 12. Technically very difficult study even with Definity. _____________   Chest CTA 04/19/2019: Impression: 1. No acute intrathoracic pathology. No CT evidence of pulmonary artery embolus. 2. Status post TAVR.  No mediastinal or pericardial fluid.  History of Present Illness     [name] is a 63 y.o. 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   Patient recently admitted from 03/30/2019 to 04/03/19 for chest and back pain. In the ER, EKG showed sinus withlateral and anterolateral ST-T wave abnormalities, more prominent anterolaterally compared to last tracings. High-sensitivity troponin minimally elevated at  41 >> 46 >> 50 >> 51 >> 49.CT angiochest/abdomen/pelvis showed no acute findings.Incidentally, she wasnoted to havethoracoabdominal atherosclerosis and valvular calcifications. Echo showed EF 55%, mild LVH, severe AS with mean gradient 71.8 mm Hg, peak gradient 105 mm Hg, DVI .014, mod AI. Due to poor image quality, TEE was recommended. L/RHC on 04/02/19 showed normal coronaries with severe aortic stenosis (mean gradient 65 mmHg, calculated valve area 0.5 cm^2), moderately to severely elevated left ventricular filling pressure, mildly elevated right and pulmonary artery pressuresand moderately reduced cardiac output.She was evaluated by the multidisciplinary valve team and underwent TAVR work up with pre TAVR CT scans during her admission.   She was brought back on 04/17/19 and underwent successfulBAV andTAVR with a30mm Edwards Sapien 3 UltraTHV via the TF approach. Post-op echo showed EF 60-65%, normally functioning TAVR with mean gradient of 16.7 mm Hg and no PVL. She was discharged home on POD 1 (04/18/19) on Aspirin and Plavix.   She went home and was doing well until morning of 04/19/2019 when she developed severe pain between her shoulder blades with some radiation to the chest. Pain similar to symptoms prior to TAVR. She presented to [facility] for [facility] evaluation. ECG today showed sinus with diffuse T wave inversions similar to previous tracings but now more dynamic. High-sensitivity troponin 358 >>  349. BMET and CBC WNL. Stat limited echo shows normal LV function  without wall motion abnormality or pericardial effusion.  During initial evaluation by Cardiology, she was currently lying in bed and tearful. She was worried she came to the ER unnecessarily. No more chest pain. She did have back pain when she turned over for examination. No edema, orthopnea or PND. No dizziness or syncope. No blood in her stool or urine.   Hospital Course     Consultants: None    Patient admitted overnight for observation.  Echo showed LVEF of 55% with moderate LVH and no evidence of pericardial effusion. Mean TAVR gradient mean gradient measure 16.0 mmHg. Chest CTA negative for PE. Chest pain and back pain resolved. Exact etiology of pain unclear. Possible musculoskeletal. EKG this morning showed normal sinus rhythm with LVH and secondary repolarization changes. Interestingly, ST/T changes are not always as prominent and come and go on EKGs performed over the last couple of months. Repeat troponin was checked this morning and continued to downtrend at 257. Therefore, troponin elevation felt to be due to mild myocardial injury likely at time of TAVR on 04/17/2019 and not due to ongoing acute coronary insufficiency event. Patient seen and examined by Dr. Sallyanne Kuster today and determined to be stable for discharge. Patient already has follow-up with TAVR team scheduled for 04/25/2019. Continue home medications. Sent Plavix prescription to [facility] on [address] because patient stated that her usual pharmacy does not have it.    Did the patient have an acute coronary syndrome (MI, NSTEMI, STEMI, etc) this admission?:  No.   The elevated Troponin was due to the acute medical illness (demand ischemia).  _____________  Discharge Vitals Blood pressure 131/72, pulse 90, temperature 98.6 F (37 C), temperature source Debra, resp. rate 20, height 5\' 8"  (1.727 m), weight 122 kg, SpO2 96 %.  Filed Weights   04/19/19 0831  Weight: 122 kg    Labs & Radiologic Studies    CBC Recent Labs     04/19/19 0859 04/20/19 0527  WBC 7.7 7.3  HGB 12.9 12.5  HCT 39.1 37.9  MCV 95.6 95.0  PLT 195 [id number]   Basic Metabolic Panel Recent Labs    04/19/19 0859 04/20/19 0527  NA 139 137  K 3.8 3.7  CL 106 104  CO2 24 24  GLUCOSE 112* 109*  BUN 7* 7*  CREATININE 0.74 0.69  CALCIUM 8.5* 8.6*   Liver Function Tests No results for input(s): AST, ALT, ALKPHOS, BILITOT, PROT, ALBUMIN in the last 72 hours. No results for input(s): LIPASE, AMYLASE in the last 72 hours. High Sensitivity Troponin:   Recent Labs  Lab 03/31/19 0850 03/31/19 1113 04/19/19 0859 04/19/19 1052 04/20/19 0527  TROPONINIHS 51* 49* 358* 349* 257*    BNP Invalid input(s): POCBNP D-Dimer No results for input(s): DDIMER in the last 72 hours. Hemoglobin A1C No results for input(s): HGBA1C in the last 72 hours. Fasting Lipid Panel No results for input(s): CHOL, HDL, LDLCALC, TRIG, CHOLHDL, LDLDIRECT in the last 72 hours. Thyroid Function Tests No results for input(s): TSH, T4TOTAL, T3FREE, THYROIDAB in the last 72 hours.  Invalid input(s): FREET3 _____________  Dg Chest 2 View  Result Date: 04/13/2019 CLINICAL DATA:  63 year old female with a history preoperative exam for TAVR EXAM: CHEST - 2 VIEW COMPARISON:  03/30/2019 FINDINGS: Cardiomediastinal silhouette unchanged in size and contour. Calcifications of the aortic valve. No evidence of central vascular congestion. No pneumothorax or pleural effusion. Coarsened interstitial markings persist with no  confluent airspace disease. IMPRESSION: Negative for acute cardiopulmonary disease. Aortic valve calcifications Electronically Signed   By: Corrie Mckusick D.O.   On: 04/13/2019 15:09   Dg Chest 2 View  Result Date: 03/30/2019 CLINICAL DATA:  Acute chest pain for 1 day. EXAM: CHEST - 2 VIEW COMPARISON:  09/25/2017 FINDINGS: The cardiomediastinal silhouette is unremarkable. There is no evidence of focal airspace disease, pulmonary edema, suspicious pulmonary  nodule/mass, pleural effusion, or pneumothorax. No acute bony abnormalities are identified. IMPRESSION: No active cardiopulmonary disease. Electronically Signed   By: Margarette Canada M.D.   On: 03/30/2019 19:28   Ct Angio Chest Pe W/cm &/or Wo Cm  Result Date: 04/19/2019 CLINICAL DATA:  63 year old female with chest pain. Status post aortic valve replacement. EXAM: CT ANGIOGRAPHY CHEST WITH CONTRAST TECHNIQUE: Multidetector CT imaging of the chest was performed using the standard protocol during bolus administration of intravenous contrast. Multiplanar CT image reconstructions and MIPs were obtained to evaluate the vascular anatomy. CONTRAST:  74mL OMNIPAQUE IOHEXOL 350 MG/ML SOLN COMPARISON:  Chest radiograph dated 04/19/2019 and CT dated 03/31/2019. FINDINGS: Cardiovascular: Top-normal cardiac size. No pericardial effusion. Aortic valve replacement noted. Mildly enlarged ascending aorta measures up to 3.7 cm. No pulmonary artery embolus identified. Mediastinum/Nodes: No hilar or mediastinal adenopathy. Calcified right hilar granuloma. Esophagus is grossly unremarkable. No mediastinal fluid collection. Lungs/Pleura: Mild diffuse hazy density throughout the lungs, likely atelectatic changes or areas of air trapping. No focal consolidation, pleural effusion, or pneumothorax. The central airways are patent. Upper Abdomen: Cholecystectomy. Apparent fatty infiltration of the liver. Musculoskeletal: No acute osseous pathology. Review of the MIP images confirms the above findings. IMPRESSION: 1. No acute intrathoracic pathology. No CT evidence of pulmonary artery embolus. 2. Status post TAVR.  No mediastinal or pericardial fluid. Electronically Signed   By: Anner Crete M.D.   On: 04/19/2019 11:34   US Venous Img Upper Left (dvt Study)  Result Date: 04/06/2019 CLINICAL DATA:  Left upper extremity pain and edema for the past 2 days. Evaluate for DVT EXAM: LEFT UPPER EXTREMITY VENOUS DOPPLER ULTRASOUND TECHNIQUE:  Gray-scale sonography with graded compression, as well as color Doppler and duplex ultrasound were performed to evaluate the upper extremity deep venous system from the level of the subclavian vein and including the jugular, axillary, basilic, radial, ulnar and upper cephalic vein. Spectral Doppler was utilized to evaluate flow at rest and with distal augmentation maneuvers. COMPARISON:  None. FINDINGS: Contralateral Subclavian Vein: Respiratory phasicity is normal and symmetric with the symptomatic side. No evidence of thrombus. Normal compressibility. Internal Jugular Vein: No evidence of thrombus. Normal compressibility, respiratory phasicity and response to augmentation. Subclavian Vein: No evidence of thrombus. Normal compressibility, respiratory phasicity and response to augmentation. Axillary Vein: No evidence of thrombus. Normal compressibility, respiratory phasicity and response to augmentation. Cephalic Vein: While a cephalic vein appears patent proximally (image 42), there is hypoechoic occlusive thrombus within the cephalic vein at the level of the forearm (image 41). Basilic Vein: While the basilic vein appears patent proximally (images 33 through 35), there is hypoechoic occlusive thrombus within the basilic vein at the level the forearm (image 36 through 40). Brachial Veins: No evidence of thrombus. Normal compressibility, respiratory phasicity and response to augmentation. Radial Veins: No evidence of thrombus. Normal compressibility, respiratory phasicity and response to augmentation. Ulnar Veins: No evidence of thrombus. Normal compressibility, respiratory phasicity and response to augmentation. Venous Reflux:  None visualized. Other Findings:  None visualized. IMPRESSION: 1. No evidence of DVT within the left upper extremity.  2. Examination is positive for occlusive superficial thrombophlebitis involving the cephalic and basilic veins at the level of the forearm. There is no extension of this  distal occlusive SVT to the more proximal superficial venous system or to the deep venous system of the left upper extremity. Electronically Signed   By: Sandi Mariscal M.D.   On: 04/06/2019 16:27   Ct Coronary Morph W/cta Cor W/score W/ca W/cm &/or Wo/cm  Addendum Date: 04/03/2019   ADDENDUM REPORT: 04/03/2019 13:01 CLINICAL DATA:  Aortic stenosis EXAM: Cardiac TAVR CT TECHNIQUE: The patient was scanned on a Siemens Force [id number] slice scanner. A 120 kV retrospective scan was triggered in the descending thoracic aorta at 111 HU's. Gantry rotation speed was 270 msecs and collimation was .9 mm. No beta blockade or nitro were given. The 3D data set was reconstructed in 5% intervals of the R-R cycle. Systolic and diastolic phases were analyzed on a dedicated work station using MPR, MIP and VRT modes. The patient received 80 cc of contrast. FINDINGS: Aortic Valve: Bicuspid heavily calcified with restricted leaflet motion Aorta: Mildly aneurysmal with bovine arc and mild calcific atherosclerosis Sinotubular Junction: 28 mm Ascending Thoracic Aorta: 37 mm Aortic Arch: 24 mm Descending Thoracic Aorta: 22 mm Sinus of Valsalva Measurements: Non-coronary: 31.7 mm Right - coronary: 32.2 mm Left - coronary: 32.9 mm Coronary Artery Height above Annulus: Left Main: 14.9 mm above annulus Right Coronary: 14.3 mm above annulus Virtual Basal Annulus Measurements: Maximum/Minimum Diameter: 27.8 mm x 22.8 mm Perimeter: 83 mm Area: 517 mm2 Coronary Arteries: Sufficient height above annulus for deployment Optimum Fluoroscopic Angle for Delivery: LAO 13 Cranial 3 degrees IMPRESSION: 1. Bicuspid AV with annular area of 517 mm2 suitable for a 26 mm Sapien 3 Ultra valve 2.  Coronary arteries sufficient height above annulus for deployment 3.  Mild aortic root enlargement 3.7 cm with bovine arch 4. Optimum angiographic angle for deployment LAO 13 Cranial 3 degrees [place] Electronically Signed   By: Jenkins Rouge M.D.   On: 04/03/2019 13:01    Result Date: 04/03/2019 EXAM: OVER-READ INTERPRETATION  CT CHEST The following report is an over-read performed by radiologist Dr. Rolm Baptise of [facility], [place] on 04/03/2019. This over-read does not include interpretation of cardiac or coronary anatomy or pathology. The coronary CTA interpretation by the cardiologist is attached. COMPARISON:  03/31/2019 FINDINGS: Vascular: Heart is normal size. Aorta is normal caliber. No dissection. Mediastinum/Nodes: No adenopathy in the lower mediastinum or hila. Calcified right hilar lymph nodes. Lungs/Pleura: Tree-in-bud nodular densities noted in the peripheral right middle lobe, right lower lobe and left lower lobe, similar to prior study. No effusions. Upper Abdomen: Imaging into the upper abdomen shows no acute findings. Musculoskeletal: Chest wall soft tissues are unremarkable. No acute bony abnormality. IMPRESSION: Clustered tree-in-bud nodular densities in the peripheral right middle lobe, right lower lobe and left lower lobe, likely related to infectious or inflammatory process/small airways disease. No acute extra cardiac abnormality. Electronically Signed: By: Rolm Baptise M.D. On: 04/03/2019 10:04   Dg Chest Port 1 View  Result Date: 04/19/2019 CLINICAL DATA:  63 year old female with a history of back pain, status post TAVR EXAM: PORTABLE CHEST 1 VIEW COMPARISON:  04/17/2019 FINDINGS: Cardiomediastinal silhouette unchanged in size and contour. Changes of TAVR. Coarsened interstitial markings persist with reticular opacities at the lung bases. No pneumothorax or pleural effusion. No confluent airspace disease. IMPRESSION: Similar appearance of chronic lung changes without evidence of acute cardiopulmonary disease. Status post TAVR Electronically Signed  By: Corrie Mckusick D.O.   On: 04/19/2019 11:31   Dg Chest Port 1 View  Result Date: 04/17/2019 CLINICAL DATA:  Post TAVR EXAM: PORTABLE CHEST 1 VIEW COMPARISON:  04/13/2019 FINDINGS: Changes of  aortic valve repair. Heart is borderline in size. No confluent airspace opacities or effusions. No pneumothorax. No acute bony abnormality. IMPRESSION: No acute cardiopulmonary disease. Electronically Signed   By: Rolm Baptise M.D.   On: 04/17/2019 19:18   Ct Angio Chest/abd/pel For Dissection W And/or Wo Contrast  Result Date: 03/31/2019 CLINICAL DATA:  Chest/back pain, acute, aortic dissection suspect EXAM: CT ANGIOGRAPHY CHEST, ABDOMEN AND PELVIS TECHNIQUE: Multidetector CT imaging through the chest, abdomen and pelvis was performed using the standard protocol during bolus administration of intravenous contrast. Multiplanar reconstructed images and MIPs were obtained and reviewed to evaluate the vascular anatomy. CONTRAST:  134mL OMNIPAQUE IOHEXOL 350 MG/ML SOLN COMPARISON:  Chest radiograph yesterday. Abdominopelvic CT 03/31/2016 FINDINGS: CTA CHEST FINDINGS Cardiovascular: Thoracic aorta is normal in caliber. No aortic dissection, acute aortic syndrome or aneurysm. Conventional branching pattern from the aortic arch. Aortic valvular calcifications. No central pulmonary embolus to the lobar level. Heart is normal in size. No pericardial effusion. Mediastinum/Nodes: Calcified right hilar nodes consistent with prior granulomatous disease. No suspicious noncalcified adenopathy. Subcentimeter left thyroid nodule does not meet size criteria for further evaluation. Patulous esophagus. Small hiatal hernia. Lungs/Pleura: Heterogeneous pulmonary parenchyma. 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 Clustered nodules in the subpleural right middle lobe, series 8, image 104, chronic and unchanged from 2017 abdominal CT. No pulmonary edema. No pleural fluid. The trachea and mainstem bronchi are patent. Musculoskeletal: Mild multilevel degenerative change in the spine. There are no acute or suspicious osseous abnormalities. Degenerative change in the  right shoulder. Review of the MIP images confirms the above findings. CTA ABDOMEN AND PELVIS FINDINGS VASCULAR Aorta: Normal caliber aorta without aneurysm, dissection, vasculitis or significant stenosis. Mild atherosclerosis. Celiac: Patent without evidence of aneurysm, dissection, vasculitis or significant stenosis. SMA: Patent without evidence of aneurysm, dissection, vasculitis or significant stenosis. Renals: Both renal arteries are patent without evidence of aneurysm, dissection, vasculitis, fibromuscular dysplasia or significant stenosis. IMA: Patent without evidence of aneurysm, dissection, vasculitis or significant stenosis. Inflow: Patent without evidence of aneurysm, dissection, vasculitis or significant stenosis. Veins: Limited assessment on this arterial phase exam. Review of the MIP images confirms the above findings. NON-VASCULAR Hepatobiliary: No focal liver abnormality is seen. Status post cholecystectomy. No biliary dilatation. Pancreas: No ductal dilatation or inflammation. Spleen: Normal in size and arterial enhancement. Adrenals/Urinary Tract: No adrenal nodule. Suggestion of slight left renal atrophy. No hydronephrosis. Tiny cortical hypodensities in the left kidney are too small to characterize. Urinary bladder partially distended. No bladder wall thickening. Stomach/Bowel: Small hiatal hernia. Stomach is decompressed and not well assessed. No small bowel wall thickening or inflammatory change. Prior appendectomy with surgical clips in the right lower quadrant. Small volume of stool throughout the colon. Colonic diverticulosis from the splenic flexure distally, prominent in the sigmoid. No evidence of diverticulitis. Lymphatic: No abdominopelvic adenopathy. Reproductive: Status post hysterectomy. No adnexal masses. Other: Postsurgical change of the right anterior abdominal wall with mesh. Fat containing umbilical hernia. No free air or free fluid. Musculoskeletal: Right hip arthroplasty.  Degenerative change in the spine with multilevel endplate changes. There are no acute or suspicious osseous abnormalities. Review of the MIP images confirms the above findings. IMPRESSION: 1. No aortic dissection or acute aortic abnormality. Mild  thoracoabdominal aortic atherosclerosis. Aortic valvular calcifications, consider further evaluation with echocardiogram. 2. No acute abnormality in the chest, abdomen, or pelvis. 3. Incidental colonic diverticulosis without diverticulitis. Small hiatal hernia. 4. Right abdominal wall hernia repair with mesh. Fat containing umbilical hernia. Aortic Atherosclerosis (ICD10-I70.0). Electronically Signed   By: Keith Rake M.D.   On: 03/31/2019 02:42   Vas US Carotid  Result Date: 04/03/2019 Carotid Arterial Duplex Study Risk Factors:      Hyperlipidemia. Other Factors:     Aortic valve disease. Pre Operative TAVR. History of Polio. Limitations        Today's exam was limited due to the body habitus of the                    patient. Comparison Study:  No prior study on file for comparison Performing Technologist: Sharion Dove RVS  Examination Guidelines: A complete evaluation includes B-mode imaging, spectral Doppler, color Doppler, and power Doppler as needed of all accessible portions of each vessel. Bilateral testing is considered an integral part of a complete examination. Limited examinations for reoccurring indications may be performed as noted.  Right Carotid Findings: +----------+--------+--------+--------+------------------+------------------+             PSV cm/s EDV cm/s Stenosis Plaque Description Comments            +----------+--------+--------+--------+------------------+------------------+  CCA Prox   78       17                                   intimal thickening  +----------+--------+--------+--------+------------------+------------------+  CCA Distal 85       21                                   intimal thickening   +----------+--------+--------+--------+------------------+------------------+  ICA Prox   51       15                                                       +----------+--------+--------+--------+------------------+------------------+  ICA Distal 80       23                                   tortuous            +----------+--------+--------+--------+------------------+------------------+  ECA        72       12                                                       +----------+--------+--------+--------+------------------+------------------+ +----------+--------+-------+--------+-------------------+             PSV cm/s EDV cms Describe Arm Pressure (mmHG)  +----------+--------+-------+--------+-------------------+  Subclavian 183                                            +----------+--------+-------+--------+-------------------+ +---------+--------+--+--------+-+  Vertebral PSV cm/s 43 EDV cm/s 9  +---------+--------+--+--------+-+  Left Carotid Findings: +----------+--------+--------+--------+------------------+------------------+             PSV cm/s EDV cm/s Stenosis Plaque Description Comments            +----------+--------+--------+--------+------------------+------------------+  CCA Prox   69       23                                   intimal thickening  +----------+--------+--------+--------+------------------+------------------+  CCA Distal 74       23                                   intimal thickening  +----------+--------+--------+--------+------------------+------------------+  ICA Prox   80       24                                                       +----------+--------+--------+--------+------------------+------------------+  ICA Distal 66       18                                   tortuous            +----------+--------+--------+--------+------------------+------------------+  ECA        96       11                                                        +----------+--------+--------+--------+------------------+------------------+ +----------+--------+--------+--------+-------------------+             PSV cm/s EDV cm/s Describe Arm Pressure (mmHG)  +----------+--------+--------+--------+-------------------+  Subclavian 189                                             +----------+--------+--------+--------+-------------------+ +---------+--------+--+--------+--+  Vertebral PSV cm/s 71 EDV cm/s 31  +---------+--------+--+--------+--+  Summary: Right Carotid: The extracranial vessels were near-normal with only minimal wall                thickening or plaque. Left Carotid: The extracranial vessels were near-normal with only minimal wall               thickening or plaque. Vertebrals:  Bilateral vertebral arteries demonstrate antegrade flow. Subclavians: Normal flow hemodynamics were seen in bilateral subclavian              arteries. *See table(s) above for measurements and observations.  Electronically signed by Ruta Hinds MD on 04/03/2019 at 11:16:57 AM.    Final    Disposition   Patient is being discharged home today in good condition.  Follow-up Plans & Appointments    Follow-up Information    Eileen Stanford, PA-C Follow up.   Specialties: Cardiology, Radiology Why: You have a follow-up appointment with Nell Range, PA-C, on 04/25/2019 at 1:00pm. Contact information: [address] [place] [address] [phone]          Discharge Instructions    Diet - low sodium heart healthy   Complete by: As directed    Increase activity slowly   Complete by: As directed       Discharge Medications   Allergies as of 04/20/2019      Reactions   Amoxicillin Anaphylaxis, Swelling   Did it involve swelling of the face/tongue/throat, SOB, or low BP? Yes Did it involve sudden or severe rash/hives, skin peeling, or any reaction on the inside of your mouth or nose? No Did you need to seek medical attention at a hospital  or doctor's office? Yes When did it last happen?yrs ago If all above answers are NO, may proceed with cephalosporin use.   Flagyl [metronidazole] Anaphylaxis   Plaquenil [hydroxychloroquine Sulfate] Other (See Comments)   Blurred vision, joint pain   Codeine Nausea Only   Doxycycline Nausea And Vomiting   Other Nausea Only   Tylenol with Codeine #3   Percocet [oxycodone-acetaminophen] Nausea Only   Sulfa Antibiotics Nausea Only      Medication List    TAKE these medications   ALPRAZolam 0.5 MG tablet Commonly known as: XANAX Take 0.5 mg by mouth 2 (two) times daily as needed for sleep.   aspirin 81 MG chewable tablet Chew 1 tablet (81 mg total) by mouth daily.   clopidogrel 75 MG tablet Commonly known as: PLAVIX Take 1 tablet (75 mg total) by mouth daily with breakfast.   D3 High Potency 25 MCG (1000 UT) capsule Generic drug: Cholecalciferol Take 1,000 Units by mouth daily.   docusate sodium 100 MG capsule Commonly known as: COLACE Take 100 mg by mouth daily.   gabapentin 300 MG capsule Commonly known as: NEURONTIN Take 300 mg by mouth daily.   pantoprazole 40 MG tablet Commonly known as: PROTONIX Take 40 mg by mouth daily.   traZODone 100 MG tablet Commonly known as: DESYREL Take 100 mg by mouth at bedtime.   venlafaxine XR 75 MG 24 hr capsule Commonly known as: EFFEXOR-XR Take 75 mg by mouth daily with breakfast.   vitamin B-12 250 MCG tablet Commonly known as: CYANOCOBALAMIN Take 250 mcg by mouth daily.          Outstanding Labs/Studies   None.  Duration of Discharge Encounter   Greater than 30 minutes including physician time.  Signed, Darreld Mclean, PA-C 04/20/2019, 12:52 PM

## 2019-04-20 NOTE — Progress Notes (Signed)
Progress Note  Patient Name: [name] Date of Encounter: 04/20/2019  Primary Cardiologist: Minus Breeding, MD   Subjective   Feels better this morning.  Chest/back pain has resolved.  No problems with her breathing.  ECG stable with a pattern consistent with sinus rhythm and left ventricular hypertrophy.  Inpatient Medications    Scheduled Meds:  aspirin  81 mg Oral Daily   cholecalciferol  1,000 Units Oral Daily   clopidogrel  75 mg Oral Q breakfast   docusate sodium  100 mg Oral Daily   enoxaparin (LOVENOX) injection  40 mg Subcutaneous Q24H   gabapentin  300 mg Oral Daily   pantoprazole  40 mg Oral Daily   traZODone  100 mg Oral QHS   venlafaxine XR  75 mg Oral Q breakfast   vitamin B-12  250 mcg Oral Daily   Continuous Infusions:  PRN Meds: acetaminophen, ALPRAZolam, ondansetron (ZOFRAN) IV   Vital Signs    Vitals:   04/19/19 1430 04/19/19 1633 04/19/19 1910 04/20/19 0435  BP: (!) 140/57 (!) 116/57 127/61 131/72  Pulse: 71 64 71 90  Resp: (!) 23 18 17 20   Temp:  97.9 F (36.6 C) 98.1 F (36.7 C) 98.6 F (37 C)  TempSrc:  Oral Oral Oral  SpO2: 98% 100% 100% 96%  Weight:      Height:        Intake/Output Summary (Last 24 hours) at 04/20/2019 0922 Last data filed at 04/20/2019 0435 Gross per 24 hour  Intake 360 ml  Output 1650 ml  Net -1290 ml   Last 3 Weights 04/19/2019 04/18/2019 04/17/2019  Weight (lbs) 269 lb 275 lb 5.7 oz 262 lb  Weight (kg) 122.018 kg 124.9 kg 118.842 kg      Telemetry    Sinus rhythm- Personally Reviewed  ECG    Normal sinus rhythm, LVH with repolarization abnormality- Personally Reviewed  Physical Exam  Obese.  Looks very comfortable. GEN: No acute distress.   Neck: No JVD Cardiac: RRR, no murmurs, rubs, or gallops.  Respiratory: Clear to auscultation bilaterally. GI: Soft, nontender, non-distended  MS: No edema; No deformity. Neuro:   Post polio weakness left lower extremity Psych: Normal affect    Labs    High Sensitivity Troponin:   Recent Labs  Lab 03/31/19 0607 03/31/19 0850 03/31/19 1113 04/19/19 0859 04/19/19 1052  TROPONINIHS 50* 51* 49* 358* 349*      Chemistry Recent Labs  Lab 04/13/19 1430  04/17/19 1634 04/19/19 0859 04/20/19 0527  NA 139   < > 140 139 137  K 4.2   < > 4.0 3.8 3.7  CL 107   < > 105 106 104  CO2 22  --   --  24 24  GLUCOSE 81   < > 129* 112* 109*  BUN 14   < > 13 7* 7*  CREATININE 0.72   < > 0.50 0.74 0.69  CALCIUM 9.0  --   --  8.5* 8.6*  PROT 7.4  --   --   --   --   ALBUMIN 3.8  --   --   --   --   AST 22  --   --   --   --   ALT 16  --   --   --   --   ALKPHOS 84  --   --   --   --   BILITOT 0.5  --   --   --   --  GFRNONAA >60  --   --  >60 >60  GFRAA >60  --   --  >60 >60  ANIONGAP 10  --   --  9 9   < > = values in this interval not displayed.     Hematology Recent Labs  Lab 04/13/19 1430  04/17/19 1634 04/19/19 0859 04/20/19 0527  WBC 7.2  --   --  7.7 7.3  RBC 4.63  --   --  4.09 3.99  HGB 14.5   < > 11.9* 12.9 12.5  HCT 43.7   < > 35.0* 39.1 37.9  MCV 94.4  --   --  95.6 95.0  MCH 31.3  --   --  31.5 31.3  MCHC 33.2  --   --  33.0 33.0  RDW 13.2  --   --  13.3 13.3  PLT 312  --   --  195 217   < > = values in this interval not displayed.    BNP Recent Labs  Lab 04/13/19 1416  BNP 152.1*     DDimer No results for input(s): DDIMER in the last 168 hours.   Radiology    Ct Angio Chest Pe W/cm &/or Wo Cm  Result Date: 04/19/2019 CLINICAL DATA:  63 year old female with chest pain. Status post aortic valve replacement. EXAM: CT ANGIOGRAPHY CHEST WITH CONTRAST TECHNIQUE: Multidetector CT imaging of the chest was performed using the standard protocol during bolus administration of intravenous contrast. Multiplanar CT image reconstructions and MIPs were obtained to evaluate the vascular anatomy. CONTRAST:  19mL OMNIPAQUE IOHEXOL 350 MG/ML SOLN COMPARISON:  Chest radiograph dated 04/19/2019 and CT dated  03/31/2019. FINDINGS: Cardiovascular: Top-normal cardiac size. No pericardial effusion. Aortic valve replacement noted. Mildly enlarged ascending aorta measures up to 3.7 cm. No pulmonary artery embolus identified. Mediastinum/Nodes: No hilar or mediastinal adenopathy. Calcified right hilar granuloma. Esophagus is grossly unremarkable. No mediastinal fluid collection. Lungs/Pleura: Mild diffuse hazy density throughout the lungs, likely atelectatic changes or areas of air trapping. No focal consolidation, pleural effusion, or pneumothorax. The central airways are patent. Upper Abdomen: Cholecystectomy. Apparent fatty infiltration of the liver. Musculoskeletal: No acute osseous pathology. Review of the MIP images confirms the above findings. IMPRESSION: 1. No acute intrathoracic pathology. No CT evidence of pulmonary artery embolus. 2. Status post TAVR.  No mediastinal or pericardial fluid. Electronically Signed   By: Anner Crete M.D.   On: 04/19/2019 11:34   Dg Chest Port 1 View  Result Date: 04/19/2019 CLINICAL DATA:  63 year old female with a history of back pain, status post TAVR EXAM: PORTABLE CHEST 1 VIEW COMPARISON:  04/17/2019 FINDINGS: Cardiomediastinal silhouette unchanged in size and contour. Changes of TAVR. Coarsened interstitial markings persist with reticular opacities at the lung bases. No pneumothorax or pleural effusion. No confluent airspace disease. IMPRESSION: Similar appearance of chronic lung changes without evidence of acute cardiopulmonary disease. Status post TAVR Electronically Signed   By: Corrie Mckusick D.O.   On: 04/19/2019 11:31    Cardiac Studies    1. Left ventricular ejection fraction, by visual estimation, is 55%. The left ventricle has normal function. There is moderately increased left ventricular hypertrophy.  2. Bioprosthetic aortic valve s/p TAVR. The valve was not well-visualized. I did not see peri-valvular regurgitation. Mean gradient 16 mmHg.  3. Global right  ventricle was not well visualized.The right ventricular size is not well visualized. Right vetricular wall thickness was not assessed.  4. Left atrial size was not well visualized.  5. Right  atrial size was not well visualized.  6. The interatrial septum was not well visualized.  7. The aortic root was not well visualized.  8. Aortic valve regurgitation is not visualized.  9. The mitral valve was not well visualized. No evidence of mitral valve regurgitation. No evidence of mitral stenosis. 10. The tricuspid valve is not well visualized. 11. The pulmonic valve was not well visualized. 12. Technically very difficult study even with Definity.  Patient Profile     63 y.o. female with recent TAVR procedure, presenting with complaints of chest pain radiating to the back.  Assessment & Plan    Feels better.  Interestingly, the chest/back pain that brought her in this time is identical given the longevity she had her work-up for aortic valve disease.  In both instances, the symptoms occurred at rest and have no relationship to physical activity, suggesting are not related to aortic valve disease.  ECG this morning is very typical for LVH with secondary repolarization abnormalities.  Interestingly, no ST-T changes are not always as prominent, but come and go on ECGs performed over the last couple of months.  If this morning's high-sensitivity troponin continues to show a downward trend, then it would be clear that the slight elevation is postprocedural.  Doubt acute coronary insufficiency as a cause for her chest pain.  Will probably discharge her home after we get the lab results.  For questions or updates, please contact [place] Please consult [email or web address] for contact info under        Signed, Sanda Klein, MD  04/20/2019, 9:22 AM

## 2019-04-20 NOTE — Discharge Instructions (Signed)
I have sent Plavix prescription to [facility] on [address].  Please keep follow-up appointment already scheduled for 04/25/2019.  Call with any questions or concerns.

## 2019-04-24 NOTE — Progress Notes (Signed)
[facility]                                       Cardiology Office Note    Date:  04/25/2019   ID:  [name], DOB 1956/03/26, MRN [id number]  PCP:  [name], MD  Cardiologist: Minus Breeding, MD / Dr. Burt Knack & Dr. Cyndia Bent (TAVR)  CC: TOC s/p TAVR  History of Present Illness:  [name] is a 63 y.o. 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   She was recently admitted from 10/16-10/20/20 for chest and back pain. In the ER, EKG showed sinus withlateral and anterolateral ST-T wave abnormalities, more prominent anterolaterally compared to last tracings. High-sensitivity troponin 41---> 46--> 50--> 51 --> 49.CT angiochest/abdomen/pelvis showed no acute findings.Incidentally, she wasnoted to havethoracoabdominal atherosclerosis and valvular calcifications. Echo showed critical AS with EF 55%, mild LVH, severe AS with mean gradient 71.8 mm Hg, peak gradient 105 mm Hg, DVI .014, mod AI. [facility] 04/02/19 showed normal cors, severe aortic stenosis (mean gradient 65 mmHg, calculated valve area 0.5 cm^2), moderately to severely elevated left ventricular filling pressure, mildly elevated right and pulmonary artery pressuresand moderately reduced cardiac output.She was evaluated by the multidisciplinary valve team and underwent TAVR work up with pre TAVR CT scans during her admission.   She was brought back on 04/17/19 and underwent successfulBAV andTAVR with a80mm Edwards Sapien 3 UltraTHV via the TF approach. Post op echo showed EF 60-65%, normally functioning TAVR with mean gradient of 16.7 mm Hg and no PVL. She was discharged home on POD 1 (04/18/19) on aspirin and plavix.   She then returned to [facility]  ED on 04/18/19 with severe back and chest pain. She was admitted overnight for observation. Echo showed LVEF of 55% with moderate LVH and no evidence of pericardial effusion. Mean TAVR gradient mean gradient measure 16.0 mmHg. Chest CTA negative for PE/ dissection. Chest pain and back pain resolved. Exact etiology of pain unclear. Possible musculoskeletal. EKG this morning showed normal sinus rhythm with LVH and secondary repolarization changes. Interestingly, ST/T changes are not always as prominent and come and go on EKGs performed over the last couple of months.   Today she presents to clinic for follow up. She is doing well with no recurrece of chest/back pain. No CP or SOB. Chronic left LE edema (paralyzed leg), but no orthopnea or PND. No dizziness or syncope. No blood in stool or urine. No palpitations. She can tell a huge difference in the way that she feels since TAVR. Her energy is markedly improved.    Past Medical History:  Diagnosis Date   Anxiety    Arthritis    low back   Cervical pain (neck)    Complication of anesthesia    DIFFICULTY WAKING UP   Coronary artery disease    Depression    Diverticulitis    GERD (gastroesophageal reflux disease)    Hyperlipidemia    Insomnia    Morbid obesity (HCC)    OSA on CPAP    PONV (postoperative nausea and vomiting)    Post-polio syndrome    With left leg paralysis   S/P TAVR (transcatheter aortic valve replacement)    Severe  aortic stenosis    Sigmoid diverticulosis     Past Surgical History:  Procedure Laterality Date   ANKLE FUSION     left   APPENDECTOMY     CHOLECYSTECTOMY     EYE SURGERY Bilateral 2020   cataracts removed August and September 2020   JOINT REPLACEMENT Right 2003   three replacements; 2003 is last one   KNEE ARTHROPLASTY     LEG SURGERY     muscle surgery related to polio   RECTAL PROLAPSE REPAIR  2020   pessary placed   RIGHT/LEFT HEART CATH AND CORONARY ANGIOGRAPHY N/A  04/02/2019   Procedure: RIGHT/LEFT HEART CATH AND CORONARY ANGIOGRAPHY;  Surgeon: Nelva Bush, MD;  Location: [place] [facility];  Service: Cardiovascular;  Laterality: N/A;   TEE WITHOUT CARDIOVERSION N/A 04/17/2019   Procedure: TRANSESOPHAGEAL ECHOCARDIOGRAM (TEE);  Surgeon: Sherren Mocha, MD;  Location: [place] [facility];  Service: Open Heart Surgery;  Laterality: N/A;   TOTAL HIP ARTHROPLASTY     right x 3   TRANSCATHETER AORTIC VALVE REPLACEMENT, TRANSFEMORAL N/A 04/17/2019   Procedure: TRANSCATHETER AORTIC VALVE REPLACEMENT, TRANSFEMORAL;  Surgeon: Sherren Mocha, MD;  Location: [place] [facility];  Service: Open Heart Surgery;  Laterality: N/A;   UMBILICAL HERNIA REPAIR      Current Medications: Outpatient Medications Prior to Visit  Medication Sig Dispense Refill   ALPRAZolam (XANAX) 0.5 MG tablet Take 0.5 mg by mouth 2 (two) times daily as needed for sleep.      aspirin 81 MG chewable tablet Chew 1 tablet (81 mg total) by mouth daily.     Cholecalciferol (D3 HIGH POTENCY) 1000 UNITS capsule Take 2,000 Units by mouth daily.      clopidogrel (PLAVIX) 75 MG tablet Take 1 tablet (75 mg total) by mouth daily with breakfast. 90 tablet 1   docusate sodium (COLACE) 100 MG capsule Take 100 mg by mouth daily.     gabapentin (NEURONTIN) 300 MG capsule Take 300 mg by mouth daily.     pantoprazole (PROTONIX) 40 MG tablet Take 40 mg by mouth daily.     traZODone (DESYREL) 100 MG tablet Take 100 mg by mouth at bedtime.      venlafaxine XR (EFFEXOR-XR) 75 MG 24 hr capsule Take 75 mg by mouth daily with breakfast.     vitamin B-12 (CYANOCOBALAMIN) 250 MCG tablet Take 250 mcg by mouth daily.     No facility-administered medications prior to visit.      Allergies:   Amoxicillin, Flagyl [metronidazole], Plaquenil [hydroxychloroquine sulfate], Codeine, Doxycycline, Other, Percocet [oxycodone-acetaminophen], and Sulfa antibiotics   Social History   Socioeconomic History    Marital status: Single    Spouse name: Not on file   Number of children: Not on file   Years of education: Not on file   Highest education level: Not on file  Occupational History   Not on file  Social Needs   Financial resource strain: Not on file   Food insecurity    Worry: Not on file    Inability: Not on file   Transportation needs    Medical: Not on file    Non-medical: Not on file  Tobacco Use   Smoking status: Never Smoker   Smokeless tobacco: Never Used  Substance and Sexual Activity   Alcohol use: No   Drug use: Never   Sexual activity: Not on file  Lifestyle   Physical activity    Days per week: Not on file    Minutes  per session: Not on file   Stress: Not on file  Relationships   Social connections    Talks on phone: Not on file    Gets together: Not on file    Attends religious service: Not on file    Active member of club or organization: Not on file    Attends meetings of clubs or organizations: Not on file    Relationship status: Not on file  Other Topics Concern   Not on file  Social History Narrative   Lives alone.     Family History:  The patient's family history includes CAD in an other family member; Cancer in her mother; Diabetes in an other family member; Heart disease in her father; Hypertension in an other family member.     ROS:   Please see the history of present illness.    ROS All other systems reviewed and are negative.   PHYSICAL EXAM:   VS:  BP 122/84    Pulse 68    Ht 5\' 8"  (1.727 m)    Wt 262 lb (118.8 kg)    SpO2 92%    BMI 39.84 kg/m    GEN: Well nourished, well developed, in no acute distress, obese. In wheelchair.  HEENT: normal Neck: no JVD or masses Cardiac: RRR; 2/6 SEM, No rubs, or gallops. Chronic left lower extremity edema (leg paralyzed) Respiratory:  clear to auscultation bilaterally, normal work of breathing GI: soft, nontender, nondistended, + BS MS: no deformity or atrophy Skin: warm and dry, no  rash.  Groin sites clear without hematoma or ecchymosis  Neuro:  Alert and Oriented x 3, Strength and sensation are intact Psych: euthymic mood, full affect   Wt Readings from Last 3 Encounters:  04/25/19 262 lb (118.8 kg)  04/19/19 269 lb (122 kg)  04/18/19 275 lb 5.7 oz (124.9 kg)    Studies/Labs Reviewed:   EKG:  EKG is ordered today.  The ekg ordered today demonstrates  NSR with LVH with repol abnormality, HR 68bpm. TW abnormalities improved from last week   Recent Labs: 04/13/2019: ALT 16; B Natriuretic Peptide 152.1 04/20/2019: BUN 7; Creatinine, Ser 0.69; Hemoglobin 12.5; Platelets 217; Potassium 3.7; Sodium 137   Lipid Panel    Component Value Date/Time   CHOL 201 (H) 03/31/2019 0607   TRIG 93 03/31/2019 0607   HDL 44 03/31/2019 0607   CHOLHDL 4.6 03/31/2019 0607   VLDL 19 03/31/2019 0607   LDLCALC 138 (H) 03/31/2019 0607    Additional studies/ records that were reviewed today include:  TAVR OPERATIVE NOTE   Date of Procedure:04/17/2019  Preoperative Diagnosis:Severe Aortic Stenosis   Postoperative Diagnosis:Same   Procedure:   Transcatheter Aortic Valve Replacement - PercutaneousRightTransfemoral Approach Edwards Sapien 3 Ultra THV (size 59mm, model # [id number], serial # [id number])  Co-Surgeons:Bryan Alveria Apley, MD and Sherren Mocha, MD   Anesthesiologist:G. Kalman Shan, MD  Anne Hahn, MD  Pre-operative Echo Findings: ? Severe aortic stenosis ? Normalleft ventricular systolic function  Post-operative Echo Findings: ? Noparavalvular leak ? Normalleft ventricular systolic function  _____________   Echo 04/18/19: IMPRESSION 1. Left ventricular ejection fraction, by visual estimation, is 60 to 65%. The left ventricle has normal function. There is no left ventricular hypertrophy. 2. Left ventricular diastolic  parameters are consistent with Grade II diastolic dysfunction (pseudonormalization). 3. Elevated left ventricular end-diastolic pressure. 4. Global right ventricle has normal systolic function.The right ventricular size is normal. No increase in right ventricular wall thickness. 5. Left atrial size was mildly dilated.  6. Right atrial size was normal. 7. The mitral valve is normal in structure. No evidence of mitral valve regurgitation. No evidence of mitral stenosis. 8. The tricuspid valve is normal in structure. Tricuspid valve regurgitation is not demonstrated. 9. 26 Edwards Sapien bioprosthetic, stented aortic valve (TAVR) valve is present in the aortic position. Perivalvular Aortic valve regurgitation is not visualized. Aortic valve mean gradient measures 16.7 mmHg. Aortic valve peak gradient measures 30.7  mmHg. Dimensionless index 0.54 and peak velocity 277cm/sec. 10. The pulmonic valve was normal in structure. Pulmonic valve regurgitation is not visualized. 11. The inferior vena cava is normal in size with greater than 50% respiratory variability, suggesting right atrial pressure of 3 mmHg. 12. TR signal is inadequate for assessing pulmonary artery systolic pressure.  _____________  Echocardiogram 04/19/2019: Impressions: 1. Left ventricular ejection fraction, by visual estimation, is 55%. The left ventricle has normal function. There is moderately increased left ventricular hypertrophy. 2. Bioprosthetic aortic valve s/p TAVR. The valve was not well-visualized. I did not see peri-valvular regurgitation. Mean gradient 16 mmHg. 3. Global right ventricle was not well visualized.The right ventricular size is not well visualized. Right vetricular wall thickness was not assessed. 4. Left atrial size was not well visualized. 5. Right atrial size was not well visualized. 6. The interatrial septum was not well visualized. 7. The aortic root was not well visualized. 8. Aortic valve  regurgitation is not visualized. 9. The mitral valve was not well visualized. No evidence of mitral valve regurgitation. No evidence of mitral stenosis. 10. The tricuspid valve is not well visualized. 11. The pulmonic valve was not well visualized. 12. Technically very difficult study even with Definity. _____________   Chest CTA 04/19/2019: Impression: 1. No acute intrathoracic pathology. No CT evidence of pulmonary artery embolus. 2. Status post TAVR. No mediastinal or pericardial fluid.  ASSESSMENT & PLAN:   Severe [id number] sites are healing well. She can tell a huge difference in the way that she feels since TAVR. Her energy is markedly improved. ECG with no HAVB. Continue on aspirin and plavix. SBE prophylaxis discussed; I have RX'd clindamycin due to a PCN allergy. I will see her back next month for follow up with echo.   Chest/back pain: resolved with no recurrence. ? Musculoskeletal.   Chronic diastolic CHF: appears euvolemic off diuretics   OSA: continue CPAP   Medication Adjustments/Labs and Tests Ordered: Current medicines are reviewed at length with the patient today.  Concerns regarding medicines are outlined above.  Medication changes, Labs and Tests ordered today are listed in the Patient Instructions below. Patient Instructions  Medication Instructions:  1) Your provider discussed the importance of taking an antibiotic prior to all dental visits to prevent damage to the heart valves from infection. You were given a prescription for CLINDAMYCIN 600 mg to take one hour prior to any dental appointment.  Follow-Up: Your 1 month TAVR echo and office visit have been rescheduled to December 22. Please arrive at 10:00AM for check-in.      Signed, Angelena Form, PA-C  04/25/2019 3:12 PM    [facility] [place], [place], [place]  [address] Phone: [phone]; Fax: [phone]

## 2019-04-25 ENCOUNTER — Other Ambulatory Visit: Payer: Self-pay

## 2019-04-25 ENCOUNTER — Ambulatory Visit (INDEPENDENT_AMBULATORY_CARE_PROVIDER_SITE_OTHER): Payer: Medicare Other | Admitting: Physician Assistant

## 2019-04-25 ENCOUNTER — Encounter: Payer: Self-pay | Admitting: Physician Assistant

## 2019-04-25 VITALS — BP 122/84 | HR 68 | Ht 68.0 in | Wt 262.0 lb

## 2019-04-25 DIAGNOSIS — Z952 Presence of prosthetic heart valve: Secondary | ICD-10-CM | POA: Diagnosis not present

## 2019-04-25 DIAGNOSIS — G4733 Obstructive sleep apnea (adult) (pediatric): Secondary | ICD-10-CM | POA: Diagnosis not present

## 2019-04-25 DIAGNOSIS — I5032 Chronic diastolic (congestive) heart failure: Secondary | ICD-10-CM | POA: Diagnosis not present

## 2019-04-25 DIAGNOSIS — R079 Chest pain, unspecified: Secondary | ICD-10-CM | POA: Diagnosis not present

## 2019-04-25 DIAGNOSIS — Z9989 Dependence on other enabling machines and devices: Secondary | ICD-10-CM

## 2019-04-25 MED ORDER — CLINDAMYCIN HCL 300 MG PO CAPS: ORAL_CAPSULE | ORAL | 6 refills | Status: DC

## 2019-04-25 NOTE — Patient Instructions (Signed)
Medication Instructions:  1) Your provider discussed the importance of taking an antibiotic prior to all dental visits to prevent damage to the heart valves from infection. You were given a prescription for CLINDAMYCIN 600 mg to take one hour prior to any dental appointment.  Follow-Up: Your 1 month TAVR echo and office visit have been rescheduled to December 22. Please arrive at 10:00AM for check-in.

## 2019-04-25 NOTE — Progress Notes (Signed)
Thanks for follow up.

## 2019-05-18 ENCOUNTER — Emergency Department (HOSPITAL_BASED_OUTPATIENT_CLINIC_OR_DEPARTMENT_OTHER)
Admission: EM | Admit: 2019-05-18 | Discharge: 2019-05-18 | Disposition: A | Discharge status: Home / Self Care | Payer: Medicare Other | Attending: Emergency Medicine | Admitting: Emergency Medicine

## 2019-05-18 ENCOUNTER — Emergency Department (HOSPITAL_BASED_OUTPATIENT_CLINIC_OR_DEPARTMENT_OTHER): Payer: Medicare Other

## 2019-05-18 ENCOUNTER — Encounter (HOSPITAL_BASED_OUTPATIENT_CLINIC_OR_DEPARTMENT_OTHER): Payer: Self-pay | Admitting: Emergency Medicine

## 2019-05-18 ENCOUNTER — Other Ambulatory Visit: Payer: Self-pay

## 2019-05-18 DIAGNOSIS — N390 Urinary tract infection, site not specified: Secondary | ICD-10-CM | POA: Diagnosis not present

## 2019-05-18 DIAGNOSIS — E785 Hyperlipidemia, unspecified: Secondary | ICD-10-CM | POA: Insufficient documentation

## 2019-05-18 DIAGNOSIS — I2510 Atherosclerotic heart disease of native coronary artery without angina pectoris: Secondary | ICD-10-CM | POA: Diagnosis not present

## 2019-05-18 DIAGNOSIS — R109 Unspecified abdominal pain: Secondary | ICD-10-CM | POA: Diagnosis not present

## 2019-05-18 DIAGNOSIS — M545 Low back pain, unspecified: Secondary | ICD-10-CM

## 2019-05-18 DIAGNOSIS — R350 Frequency of micturition: Secondary | ICD-10-CM | POA: Diagnosis present

## 2019-05-18 DIAGNOSIS — Z7982 Long term (current) use of aspirin: Secondary | ICD-10-CM | POA: Insufficient documentation

## 2019-05-18 LAB — BASIC METABOLIC PANEL
Anion gap: 9 (ref 5–15)
BUN: 16 mg/dL (ref 8–23)
CO2: 22 mmol/L (ref 22–32)
Calcium: 9.3 mg/dL (ref 8.9–10.3)
Chloride: 105 mmol/L (ref 98–111)
Creatinine, Ser: 0.52 mg/dL (ref 0.44–1.00)
GFR calc Af Amer: >60 mL/min (ref >60)
GFR calc non Af Amer: >60 mL/min (ref >60)
Glucose, Bld: 124 mg/dL — ABNORMAL HIGH (ref 70–99)
Potassium: 3.9 mmol/L (ref 3.5–5.1)
Sodium: 136 mmol/L (ref 135–145)

## 2019-05-18 LAB — CBC WITH DIFFERENTIAL/PLATELET
Abs Immature Granulocytes: 0.02 10*3/uL (ref 0.00–0.07)
Basophils Absolute: 0.1 10*3/uL (ref 0.0–0.1)
Basophils Relative: 1 %
Eosinophils Absolute: 0.4 10*3/uL (ref 0.0–0.5)
Eosinophils Relative: 5 %
HCT: 44.6 % (ref 36.0–46.0)
Hemoglobin: 14.7 g/dL (ref 12.0–15.0)
Immature Granulocytes: 0 %
Lymphocytes Relative: 27 %
Lymphs Abs: 1.8 10*3/uL (ref 0.7–4.0)
MCH: 30.9 pg (ref 26.0–34.0)
MCHC: 33 g/dL (ref 30.0–36.0)
MCV: 93.7 fL (ref 80.0–100.0)
Monocytes Absolute: 0.6 10*3/uL (ref 0.1–1.0)
Monocytes Relative: 9 %
Neutro Abs: 4 10*3/uL (ref 1.7–7.7)
Neutrophils Relative %: 58 %
Platelets: 256 10*3/uL (ref 150–400)
RBC: 4.76 MIL/uL (ref 3.87–5.11)
RDW: 12.4 % (ref 11.5–15.5)
WBC: 6.8 10*3/uL (ref 4.0–10.5)
nRBC: 0 % (ref 0.0–0.2)

## 2019-05-18 LAB — URINALYSIS, ROUTINE W REFLEX MICROSCOPIC
Bilirubin Urine: NEGATIVE
Glucose, UA: 100 mg/dL — AB
Hgb urine dipstick: NEGATIVE
Ketones, ur: NEGATIVE mg/dL
Leukocytes,Ua: NEGATIVE
Nitrite: POSITIVE — AB
Protein, ur: NEGATIVE mg/dL
Specific Gravity, Urine: 1.01 (ref 1.005–1.030)
pH: 5 (ref 5.0–8.0)

## 2019-05-18 LAB — URINALYSIS, MICROSCOPIC (REFLEX): RBC / HPF: NONE SEEN RBC/hpf (ref 0–5)

## 2019-05-18 MED ORDER — CIPROFLOXACIN HCL 500 MG PO TABS: 500.0000 mg | ORAL_TABLET | Freq: Two times a day (BID) | ORAL | 0 refills | Status: DC

## 2019-05-18 MED ORDER — PHENAZOPYRIDINE HCL 200 MG PO TABS: 200.0000 mg | ORAL_TABLET | Freq: Three times a day (TID) | ORAL | 0 refills | Status: DC

## 2019-05-18 MED ORDER — CIPROFLOXACIN HCL 500 MG PO TABS
500.0000 mg | ORAL_TABLET | Freq: Once | ORAL | Status: AC
  Administered 2019-05-18: 09:00:00 500 mg via ORAL
  Filled 2019-05-18: qty 1

## 2019-05-18 NOTE — ED Notes (Signed)
Patient transported to CT 

## 2019-05-18 NOTE — Discharge Instructions (Addendum)
You were seen in the emergency department for urinary symptoms and low back pain.  You had blood work urinalysis and a CAT scan.  We are treating you for possible urinary tract infection with antibiotics and some medication for urinary spasm.  Please drink plenty of fluids.  Follow-up with your doctor and return to the emergency department if any worsening symptoms.

## 2019-05-18 NOTE — ED Provider Notes (Signed)
[facility] EMERGENCY DEPARTMENT Provider Note   CSN: [id number] Arrival date & time: 05/18/19  [id number]     History   Chief Complaint Chief Complaint  Patient presents with   Urinary Frequency    HPI [name] is a 63 y.o. female.  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  She said she had a TAVR done early in November and due to the pirwick she has had some discomfort in her perineal area.  For the last day or so she has had urinary frequency, urgency, and spasming low back pain.  She rates the pain is severe in nature and intermittent.  No falls.  No fevers or chills no cough shortness of breath.  No runny nose sore throat.  She has not noticed any hematuria.     The history is provided by the patient.  Urinary Frequency This is a new problem. The current episode started 2 days ago. The problem occurs hourly. The problem has not changed since onset.Pertinent negatives include no chest pain, no abdominal pain, no headaches and no shortness of breath. Nothing aggravates the symptoms. Nothing relieves the symptoms. She has tried nothing for the symptoms. The treatment provided no relief.    Past Medical History:  Diagnosis Date   Anxiety    Arthritis    low back   Cervical pain (neck)    Complication of anesthesia    DIFFICULTY WAKING UP   Coronary artery disease    Depression    Diverticulitis    GERD (gastroesophageal reflux disease)    Hyperlipidemia    Insomnia    Morbid obesity (HCC)    OSA on CPAP    PONV (postoperative nausea and vomiting)    Post-polio syndrome    With left leg paralysis   S/P TAVR (transcatheter aortic valve replacement)    Severe aortic stenosis    Sigmoid diverticulosis     Patient Active Problem List   Diagnosis Date Noted   Back pain 04/20/2019   Chest pain 04/19/2019   Chronic diastolic CHF (congestive heart failure) ([place]) 04/17/2019   S/P TAVR (transcatheter aortic valve  replacement) 04/17/2019   Post-polio syndrome    Morbid obesity (HCC)    OSA on CPAP    Severe aortic stenosis    Hyperlipidemia    GERD (gastroesophageal reflux disease)    Depression with anxiety    Obesity (BMI 35.0-39.9 without comorbidity) 04/12/2013    Past Surgical History:  Procedure Laterality Date   ANKLE FUSION     left   APPENDECTOMY     CHOLECYSTECTOMY     EYE SURGERY Bilateral 2020   cataracts removed August and September 2020   JOINT REPLACEMENT Right 2003   three replacements; 2003 is last one   KNEE ARTHROPLASTY     LEG SURGERY     muscle surgery related to polio   RECTAL PROLAPSE REPAIR  2020   pessary placed   RIGHT/LEFT HEART CATH AND CORONARY ANGIOGRAPHY N/A 04/02/2019   Procedure: RIGHT/LEFT HEART CATH AND CORONARY ANGIOGRAPHY;  Surgeon: Nelva Bush, MD;  Location: [facility];  Service: Cardiovascular;  Laterality: N/A;   TEE WITHOUT CARDIOVERSION N/A 04/17/2019   Procedure: TRANSESOPHAGEAL ECHOCARDIOGRAM (TEE);  Surgeon: Sherren Mocha, MD;  Location: [facility];  Service: Open Heart Surgery;  Laterality: N/A;   TOTAL HIP ARTHROPLASTY     right x 3   TRANSCATHETER AORTIC VALVE REPLACEMENT, TRANSFEMORAL N/A 04/17/2019   Procedure: TRANSCATHETER  AORTIC VALVE REPLACEMENT, TRANSFEMORAL;  Surgeon: Sherren Mocha, MD;  Location: [facility];  Service: Open Heart Surgery;  Laterality: N/A;   UMBILICAL HERNIA REPAIR       OB History   No obstetric history on file.      Home Medications    Prior to Admission medications   Medication Sig Start Date End Date Taking? Authorizing Provider  ALPRAZolam Duanne Moron) 0.5 MG tablet Take 0.5 mg by mouth 2 (two) times daily as needed for sleep.     [provider]  aspirin 81 MG chewable tablet Chew 1 tablet (81 mg total) by mouth daily. 04/19/19   Eileen Stanford, PA-C  Cholecalciferol (D3 HIGH POTENCY) 1000 UNITS capsule Take 2,000 Units by mouth daily.      [provider]  clindamycin (CLEOCIN) 300 MG capsule Take 2 capsules (600 mg) one hour prior to all dental appointments. 04/25/19   Eileen Stanford, PA-C  clopidogrel (PLAVIX) 75 MG tablet Take 1 tablet (75 mg total) by mouth daily with breakfast. 04/20/19   Sande Rives E, PA-C  docusate sodium (COLACE) 100 MG capsule Take 100 mg by mouth daily.    [provider]  gabapentin (NEURONTIN) 300 MG capsule Take 300 mg by mouth daily.    [provider]  pantoprazole (PROTONIX) 40 MG tablet Take 40 mg by mouth daily.    [provider]  traZODone (DESYREL) 100 MG tablet Take 100 mg by mouth at bedtime.     [provider]  venlafaxine XR (EFFEXOR-XR) 75 MG 24 hr capsule Take 75 mg by mouth daily with breakfast.    [provider]  vitamin B-12 (CYANOCOBALAMIN) 250 MCG tablet Take 250 mcg by mouth daily.    [provider]  loratadine (CLARITIN) 10 MG tablet Take 1 tablet (10 mg total) by mouth daily. Patient not taking: Reported on 03/31/2019 09/25/17 04/06/19  Horton, Barbette Hair, MD  sodium chloride (OCEAN) 0.65 % SOLN nasal spray Place 1 spray into both nostrils as needed for congestion. Patient not taking: Reported on 03/31/2019 09/25/17 04/06/19  Horton, Barbette Hair, MD    Family History Family History  Problem Relation Age of Onset   Cancer Mother    Heart disease Father    CAD Other        FAM Hx OF CAD   Diabetes Other        FAM Hx of DM   Hypertension Other     Social History Social History   Tobacco Use   Smoking status: Never Smoker   Smokeless tobacco: Never Used  Substance Use Topics   Alcohol use: No   Drug use: Never     Allergies   Amoxicillin, Flagyl [metronidazole], Plaquenil [hydroxychloroquine sulfate], Codeine, Doxycycline, Other, Percocet [oxycodone-acetaminophen], and Sulfa antibiotics   Review of Systems Review of Systems  Constitutional: Negative for fever.  HENT: Negative  for sore throat.   Eyes: Negative for visual disturbance.  Respiratory: Negative for shortness of breath.   Cardiovascular: Negative for chest pain.  Gastrointestinal: Negative for abdominal pain.  Genitourinary: Positive for frequency. Negative for dysuria.  Musculoskeletal: Positive for back pain.  Skin: Negative for rash.  Neurological: Negative for headaches.     Physical Exam Updated Vital Signs BP 110/64 (BP Location: Right Arm)    Pulse 66    Temp 97.7 F (36.5 C) (Oral)    Resp 18    Ht 5\' 8"  (1.727 m)    Wt 119 kg  SpO2 97%    BMI 39.89 kg/m   Physical Exam Vitals signs and nursing note reviewed.  Constitutional:      General: She is not in acute distress.    Appearance: She is well-developed.  HENT:     Head: Normocephalic and atraumatic.  Eyes:     Conjunctiva/sclera: Conjunctivae normal.  Neck:     Musculoskeletal: Neck supple.  Cardiovascular:     Rate and Rhythm: Normal rate and regular rhythm.     Heart sounds: No murmur.  Pulmonary:     Effort: Pulmonary effort is normal. No respiratory distress.     Breath sounds: Normal breath sounds.  Abdominal:     Palpations: Abdomen is soft.     Tenderness: There is no abdominal tenderness.  Musculoskeletal:        General: No deformity or signs of injury.  Skin:    General: Skin is warm and dry.     Capillary Refill: Capillary refill takes less than 2 seconds.  Neurological:     Mental Status: She is alert. Mental status is at baseline.      ED Treatments / Results  Labs (all labs ordered are listed, but only abnormal results are displayed) Labs Reviewed  URINALYSIS, ROUTINE W REFLEX MICROSCOPIC - Abnormal; Notable for the following components:      Result Value   Color, Urine ORANGE (*)    Glucose, UA 100 (*)    Nitrite POSITIVE (*)    All other components within normal limits  BASIC METABOLIC PANEL - Abnormal; Notable for the following components:   Glucose, Bld 124 (*)    All other components  within normal limits  URINALYSIS, MICROSCOPIC (REFLEX) - Abnormal; Notable for the following components:   Bacteria, UA RARE (*)    All other components within normal limits  URINE CULTURE  CBC WITH DIFFERENTIAL/PLATELET    EKG None  Radiology Ct Renal Stone Study  Result Date: 05/18/2019 CLINICAL DATA:  Low back pain, flank pain and urinary frequency and urgency. EXAM: CT ABDOMEN AND PELVIS WITHOUT CONTRAST TECHNIQUE: Multidetector CT imaging of the abdomen and pelvis was performed following the standard protocol without IV contrast. COMPARISON:  02/28/2009 FINDINGS: Lower chest: The lung bases are clear of an acute process. No pleural effusion. The heart is normal in size. No pericardial effusion. There is a small hiatal hernia noted. Hepatobiliary: No focal hepatic lesions are identified without contrast. The gallbladder is surgically absent. No intra or extrahepatic biliary dilatation. Pancreas: No mass, inflammation or ductal dilatation. Spleen: Normal size.  No focal lesions. Adrenals/Urinary Tract: The adrenal glands and kidneys are unremarkable. No definite renal or obstructing ureteral calculi. The distal right ureter is difficult to identify because of significant artifact from a right hip prosthesis. The bladder is grossly normal. Stomach/Bowel: The stomach, duodenum and small bowel are unremarkable. No acute inflammatory changes, mass lesions or obstructive findings. The terminal ileum is normal. The appendix is not identified. Colonic diverticulosis, severe in the sigmoid area but no definite findings for acute diverticulitis. No colonic mass or obstructive process. Vascular/Lymphatic: The aorta is normal in caliber. No atheroscerlotic calcifications. No mesenteric of retroperitoneal mass or adenopathy. Small scattered lymph nodes are noted. Reproductive: Grossly normal but limited evaluation due to artifact from the right hip prosthesis. A pessary ring is noted in the vagina. Other: No  pelvic mass or adenopathy. No free pelvic fluid collections. Moderate-sized anterior abdominal wall hernia noted containing omental fat. There are surgical changes from a prior  hernia repair with mesh below the level of this hernia. Musculoskeletal: Surgical changes from a probable redo right hip prosthesis. Significant artifact but no obvious complicating features. Scoliosis and degenerative lumbar spondylosis noted. IMPRESSION: 1. No acute abdominal/pelvic findings, mass lesions or adenopathy. 2. No definite renal, ureteral or bladder calculi or mass. 3. Periumbilical abdominal wall hernia containing fat. 4. Small hiatal hernia. 5. Status post cholecystectomy.  No biliary dilatation. 6. Severe sigmoid colon diverticulosis but no definite findings for acute diverticulitis. Electronically Signed   By: Marijo Sanes M.D.   On: 05/18/2019 08:25    Procedures Procedures (including critical care time)  Medications Ordered in ED Medications  ciprofloxacin (CIPRO) tablet 500 mg (500 mg Oral Given 05/18/19 [id number])     Initial Impression / Assessment and Plan / ED Course  I have reviewed the triage vital signs and the nursing notes.  Pertinent labs & imaging results that were available during my care of the patient were reviewed by me and considered in my medical decision making (see chart for details).  Clinical Course as of May 17 913  Fri May 17, 3458  3660 63 year old female here with low back pain and urinary frequency.  Does not seem to be picking aside.  Differential includes UTI, musculoskeletal pain, pyelonephritis, renal colic   [MB]  [id number] Urinalysis somewhat equivocal with clumps of white cells and nitrite positive although reading 0-5 WBCs.   [MB]  [phone] CT did not show any acute findings to explain her symptoms.  We will cover her for urine infection.  Reviewed these findings with her and plan and she is comfortable.   [MB]    Clinical Course User Index [MB] Hayden Rasmussen, MD         Final Clinical Impressions(s) / ED Diagnoses   Final diagnoses:  Lower urinary tract infectious disease  Acute bilateral low back pain without sciatica    ED Discharge Orders         Ordered    ciprofloxacin (CIPRO) 500 MG tablet  2 times daily     05/18/19 0833    phenazopyridine (PYRIDIUM) 200 MG tablet  3 times daily     05/18/19 0833           Hayden Rasmussen, MD 05/18/19 [phone]

## 2019-05-18 NOTE — ED Triage Notes (Signed)
Patient presents with complaints of lower back pain and urinary frequency and urgency; states urinary incontinence yesterday as well. Patient co nausea as well.

## 2019-05-19 LAB — URINE CULTURE: Culture: NO GROWTH

## 2019-05-24 ENCOUNTER — Ambulatory Visit: Payer: Medicare Other | Admitting: Physician Assistant

## 2019-05-24 ENCOUNTER — Other Ambulatory Visit (HOSPITAL_COMMUNITY): Payer: Medicare Other

## 2019-05-31 ENCOUNTER — Ambulatory Visit: Payer: Medicare Other | Admitting: Physician Assistant

## 2019-06-04 ENCOUNTER — Other Ambulatory Visit (HOSPITAL_COMMUNITY): Payer: Medicare Other

## 2019-06-04 ENCOUNTER — Ambulatory Visit: Payer: Medicare Other | Admitting: Cardiology

## 2019-06-04 NOTE — Progress Notes (Signed)
[facility] [place]                                       Cardiology Office Note    Date:  06/05/2019   ID:  [name], DOB [birth year], MRN [id number]  PCP:  [name], MD  Cardiologist: Minus Breeding, MD / Dr. Burt Knack & Dr. Cyndia Bent (TAVR)  CC: 1 month s/p TAVR  History of Present Illness:  [name] is a 63 y.o. 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   She was admitted from 10/16-10/20/20 for chest and back pain. In the ER, EKG showed sinus withlateral and anterolateral ST-T wave abnormalities, more prominent anterolaterally compared to last tracings. High-sensitivity troponin 41---> 46--> 50--> 51 --> 49.CT angiochest/abdomen/pelvis showed no acute findings.Incidentally, she wasnoted to havethoracoabdominal atherosclerosis and valvular calcifications. Echo showed critical AS with EF 55%, mild LVH, severe AS with mean gradient 71.8 mm Hg, peak gradient 105 mm Hg, DVI .014, mod AI. [facility] 04/02/19 showed normal cors, severe aortic stenosis (mean gradient 65 mmHg, calculated valve area 0.5 cm^2), moderately to severely elevated left ventricular filling pressure, mildly elevated right and pulmonary artery pressuresand moderately reduced cardiac output.She was evaluated by the multidisciplinary valve team and underwent TAVR work up with pre TAVR CT scans during her admission.   She was brought back on 04/17/19 and underwent successfulBAV andTAVR with a62mm Edwards Sapien 3 UltraTHV via the TF approach. Post op echo showed EF 60-65%, normally functioning TAVR with mean gradient of 16.7 mm Hg and no PVL. She was discharged home on POD 1 (04/18/19) on aspirin and plavix.   She then returned to [facility] ED on  04/18/19 with severe back and chest pain. She was admitted overnight for observation. Echo showed LVEF of 55% with moderate LVH and no evidence of pericardial effusion. Mean TAVR gradient mean gradient measure 16.0 mmHg. Chest CTA negative for PE/ dissection. Chest pain and back pain resolved. Exact etiology of pain unclear. Possiblly musculoskeletal.  Today she presents to clinic for follow up. She is doing quite well. No CP or SOB. No LE edema, orthopnea or PND. No dizziness or syncope. No blood in stool or urine. No palpitations. She sometimes can feel her heart swooshing at night. Excited to do the flowers for her nieces wedding in January.    Past Medical History:  Diagnosis Date  . Anxiety   . Arthritis    low back  . Cervical pain (neck)   . Complication of anesthesia    DIFFICULTY WAKING UP  . Coronary artery disease   . Depression   . Diverticulitis   . GERD (gastroesophageal reflux disease)   . Hyperlipidemia   . Insomnia   . Morbid obesity ([place])   . OSA on CPAP   . PONV (postoperative nausea and vomiting)   . Post-polio syndrome    With left leg paralysis  . S/P TAVR (transcatheter aortic valve replacement)   . Severe aortic stenosis   . Sigmoid diverticulosis     Past Surgical History:  Procedure Laterality Date  . ANKLE FUSION     left  . APPENDECTOMY    . CHOLECYSTECTOMY    . EYE SURGERY Bilateral 2020  cataracts removed August and September 2020  . JOINT REPLACEMENT Right 2003   three replacements; 2003 is last one  . KNEE ARTHROPLASTY    . LEG SURGERY     muscle surgery related to polio  . RECTAL PROLAPSE REPAIR  2020   pessary placed  . RIGHT/LEFT HEART CATH AND CORONARY ANGIOGRAPHY N/A 04/02/2019   Procedure: RIGHT/LEFT HEART CATH AND CORONARY ANGIOGRAPHY;  Surgeon: Nelva Bush, MD;  Location: [place] [facility];  Service: Cardiovascular;  Laterality: N/A;  . TEE WITHOUT CARDIOVERSION N/A 04/17/2019   Procedure: TRANSESOPHAGEAL ECHOCARDIOGRAM  (TEE);  Surgeon: Sherren Mocha, MD;  Location: [place] [facility];  Service: Open Heart Surgery;  Laterality: N/A;  . TOTAL HIP ARTHROPLASTY     right x 3  . TRANSCATHETER AORTIC VALVE REPLACEMENT, TRANSFEMORAL N/A 04/17/2019   Procedure: TRANSCATHETER AORTIC VALVE REPLACEMENT, TRANSFEMORAL;  Surgeon: Sherren Mocha, MD;  Location: [place] [facility];  Service: Open Heart Surgery;  Laterality: N/A;  . UMBILICAL HERNIA REPAIR      Current Medications: Outpatient Medications Prior to Visit  Medication Sig Dispense Refill  . ALPRAZolam (XANAX) 0.5 MG tablet Take 0.5 mg by mouth 2 (two) times daily as needed for sleep.     Marland Kitchen aspirin 81 MG chewable tablet Chew 1 tablet (81 mg total) by mouth daily.    . Cholecalciferol (D3 HIGH POTENCY) 1000 UNITS capsule Take 2,000 Units by mouth daily.     Marland Kitchen docusate sodium (COLACE) 100 MG capsule Take 100 mg by mouth daily.    Marland Kitchen gabapentin (NEURONTIN) 300 MG capsule Take 300 mg by mouth daily.    . pantoprazole (PROTONIX) 40 MG tablet Take 40 mg by mouth daily.    . traZODone (DESYREL) 100 MG tablet Take 100 mg by mouth at bedtime.     Marland Kitchen venlafaxine XR (EFFEXOR-XR) 75 MG 24 hr capsule Take 75 mg by mouth daily with breakfast.    . vitamin B-12 (CYANOCOBALAMIN) 250 MCG tablet Take 250 mcg by mouth daily.    . clopidogrel (PLAVIX) 75 MG tablet Take 1 tablet (75 mg total) by mouth daily with breakfast. 90 tablet 1  . ciprofloxacin (CIPRO) 500 MG tablet Take 1 tablet (500 mg total) by mouth 2 (two) times daily. (Patient not taking: Reported on 06/05/2019) 14 tablet 0  . clindamycin (CLEOCIN) 300 MG capsule Take 2 capsules (600 mg) one hour prior to all dental appointments. (Patient not taking: Reported on 06/05/2019) 4 capsule 6  . phenazopyridine (PYRIDIUM) 200 MG tablet Take 1 tablet (200 mg total) by mouth 3 (three) times daily. (Patient not taking: Reported on 06/05/2019) 6 tablet 0   No facility-administered medications prior to visit.     Allergies:    Amoxicillin, Flagyl [metronidazole], Plaquenil [hydroxychloroquine sulfate], Codeine, Doxycycline, Other, Percocet [oxycodone-acetaminophen], and Sulfa antibiotics   Social History   Socioeconomic History  . Marital status: Single    Spouse name: Not on file  . Number of children: Not on file  . Years of education: Not on file  . Highest education level: Not on file  Occupational History  . Not on file  Tobacco Use  . Smoking status: Never Smoker  . Smokeless tobacco: Never Used  Substance and Sexual Activity  . Alcohol use: No  . Drug use: Never  . Sexual activity: Not on file  Other Topics Concern  . Not on file  Social History Narrative   Lives alone.   Social Determinants of Health   Financial Resource Strain:   .  Difficulty of Paying Living Expenses: Not on file  Food Insecurity:   . Worried About Charity fundraiser in the Last Year: Not on file  . Ran Out of Food in the Last Year: Not on file  Transportation Needs:   . Lack of Transportation (Medical): Not on file  . Lack of Transportation (Non-Medical): Not on file  Physical Activity:   . Days of Exercise per Week: Not on file  . Minutes of Exercise per Session: Not on file  Stress:   . Feeling of Stress : Not on file  Social Connections:   . Frequency of Communication with Friends and Family: Not on file  . Frequency of Social Gatherings with Friends and Family: Not on file  . Attends Religious Services: Not on file  . Active Member of Clubs or Organizations: Not on file  . Attends Archivist Meetings: Not on file  . Marital Status: Not on file     Family History:  The patient's family history includes CAD in an other family member; Cancer in her mother; Diabetes in an other family member; Heart disease in her father; Hypertension in an other family member.     ROS:   Please see the history of present illness.    ROS All other systems reviewed and are negative.   PHYSICAL EXAM:   VS:  BP  116/80   Pulse 75   Ht 5\' 8"  (1.727 m)   Wt 262 lb (118.8 kg)   SpO2 96%   BMI 39.84 kg/m    GEN: Well nourished, well developed, in no acute distress, obese. In wheelchair. HEENT: normal Neck: no JVD or masses Cardiac: RRR; 2/6 murmur. No rubs, or gallops,no edema  Respiratory:  clear to auscultation bilaterally, normal work of breathing GI: soft, nontender, nondistended, + BS MS: no deformity or atrophy Skin: warm and dry, no rash Neuro:  Alert and Oriented x 3, Strength and sensation are intact Psych: euthymic mood, full affect    Wt Readings from Last 3 Encounters:  06/05/19 262 lb (118.8 kg)  05/18/19 262 lb 5.6 oz (119 kg)  04/25/19 262 lb (118.8 kg)    Studies/Labs Reviewed:   EKG:  EKG is NOT ordered today.   Recent Labs: 04/13/2019: ALT 16; B Natriuretic Peptide 152.1 05/18/2019: BUN 16; Creatinine, Ser 0.52; Hemoglobin 14.7; Platelets 256; Potassium 3.9; Sodium 136   Lipid Panel    Component Value Date/Time   CHOL 201 (H) 03/31/2019 0607   TRIG 93 03/31/2019 0607   HDL 44 03/31/2019 0607   CHOLHDL 4.6 03/31/2019 0607   VLDL 19 03/31/2019 0607   LDLCALC 138 (H) 03/31/2019 0607    Additional studies/ records that were reviewed today include:  TAVR OPERATIVE NOTE   Date of Procedure:04/17/2019  Preoperative Diagnosis:Severe Aortic Stenosis   Postoperative Diagnosis:Same   Procedure:   Transcatheter Aortic Valve Replacement - PercutaneousRightTransfemoral Approach Edwards Sapien 3 Ultra THV (size 64mm, model # C6110506, serial # [id number])  Co-Surgeons:Bryan Alveria Apley, MD and Sherren Mocha, MD   Anesthesiologist:G. Kalman Shan, MD  Anne Hahn, MD  Pre-operative Echo Findings: ? Severe aortic stenosis ? Normalleft ventricular systolic function  Post-operative Echo Findings: ? Noparavalvular  leak ? Normalleft ventricular systolic function  _____________   Echo 04/18/19: IMPRESSION 1. Left ventricular ejection fraction, by visual estimation, is 60 to 65%. The left ventricle has normal function. There is no left ventricular hypertrophy. 2. Left ventricular diastolic parameters are consistent with Grade II diastolic dysfunction (pseudonormalization). 3.  Elevated left ventricular end-diastolic pressure. 4. Global right ventricle has normal systolic function.The right ventricular size is normal. No increase in right ventricular wall thickness. 5. Left atrial size was mildly dilated. 6. Right atrial size was normal. 7. The mitral valve is normal in structure. No evidence of mitral valve regurgitation. No evidence of mitral stenosis. 8. The tricuspid valve is normal in structure. Tricuspid valve regurgitation is not demonstrated. 9. 26 Edwards Sapien bioprosthetic, stented aortic valve (TAVR) valve is present in the aortic position. Perivalvular Aortic valve regurgitation is not visualized. Aortic valve mean gradient measures 16.7 mmHg. Aortic valve peak gradient measures 30.7  mmHg. Dimensionless index 0.54 and peak velocity 277cm/sec. 10. The pulmonic valve was normal in structure. Pulmonic valve regurgitation is not visualized. 11. The inferior vena cava is normal in size with greater than 50% respiratory variability, suggesting right atrial pressure of 3 mmHg. 12. TR signal is inadequate for assessing pulmonary artery systolic pressure.  _____________  Echocardiogram 04/19/2019: Impressions: 1. Left ventricular ejection fraction, by visual estimation, is 55%. The left ventricle has normal function. There is moderately increased left ventricular hypertrophy. 2. Bioprosthetic aortic valve s/p TAVR. The valve was not well-visualized. I did not see peri-valvular regurgitation. Mean gradient 16 mmHg. 3. Global right ventricle was not well visualized.The right ventricular  size is not well visualized. Right vetricular wall thickness was not assessed. 4. Left atrial size was not well visualized. 5. Right atrial size was not well visualized. 6. The interatrial septum was not well visualized. 7. The aortic root was not well visualized. 8. Aortic valve regurgitation is not visualized. 9. The mitral valve was not well visualized. No evidence of mitral valve regurgitation. No evidence of mitral stenosis. 10. The tricuspid valve is not well visualized. 11. The pulmonic valve was not well visualized. 12. Technically very difficult study even with Definity. _____________   Chest CTA 04/19/2019: Impression: 1. No acute intrathoracic pathology. No CT evidence of pulmonary artery embolus. 2. Status post TAVR. No mediastinal or pericardial fluid.  ______________  Echo 06/05/19 IMPRESSIONS  1. Left ventricular ejection fraction, by visual estimation, is 55 to 60%. The left ventricle has normal function. There is moderately increased left ventricular hypertrophy.  2. Left ventricular diastolic parameters are consistent with Grade I diastolic dysfunction (impaired relaxation).  3. The left ventricle has no regional wall motion abnormalities.  4. Global right ventricle has normal systolic function.The right ventricular size is normal. No increase in right ventricular wall thickness.  5. Left atrial size was mildly dilated.  6. Right atrial size was normal.  7. The mitral valve is normal in structure. No evidence of mitral valve regurgitation. No evidence of mitral stenosis.  8. The tricuspid valve is normal in structure. Tricuspid valve regurgitation is not demonstrated.  9. Aortic valve mean gradient measures 11.5 mmHg. 10. Aortic valve regurgitation is not visualized. No evidence of aortic valve sclerosis or stenosis. 11. S/p TAVR 47mm Edwards Sapien. Transaortic valve velocity 2.71m/s, mean gradient 60mmHg. 12. The pulmonic valve was normal in structure.  Pulmonic valve regurgitation is not visualized. 13. The inferior vena cava is normal in size with greater than 50% respiratory variability, suggesting right atrial pressure of 3 mmHg. 14. No significant change from prior study.  Aortic Valve: The aortic valve has been repaired/replaced. Aortic valve regurgitation is not visualized. The aortic valve is structurally normal, with no evidence of sclerosis or stenosis. Aortic valve mean gradient measures 11.5 mmHg. Aortic valve peak gradient measures 22.4 mmHg. Aortic valve  area, by VTI measures 1.50 cm. S/p TAVR 26mm Edwards Sapien. Transaortic valve velocity 2.64m/s, mean gradient 74mmHg.   ASSESSMENT & PLAN:   Severe AS s/p TAVR: echo today shows EF 55%, normally functioning with a mean gradient of 11.5 mm Hg and no PVL. She has NYHA class I symptoms. SBE prophylaxis discussed; she has clindamycin. Plavix can be discontinued after 6 months of therapy (10/2019). Continue on aspirin indefinitely.   Chest/back pain: resolved.   Chronic diastolic CHF: appears euvolemic off diuretics   OSA: continue CPAP   Medication Adjustments/Labs and Tests Ordered: Current medicines are reviewed at length with the patient today.  Concerns regarding medicines are outlined above.  Medication changes, Labs and Tests ordered today are listed in the Patient Instructions below. Patient Instructions  Medication Instructions:  Your physician has recommended you make the following change in your medication:  1.) stop Plavix (clopidogrel) in May when you finish out that bottle  *If you need a refill on your cardiac medications before your next appointment, please call your pharmacy*  Lab Work: none If you have labs (blood work) drawn today and your tests are completely normal, you will receive your results only by: Marland Kitchen MyChart Message (if you have MyChart) OR . A paper copy in the mail If you have any lab test that is abnormal or we need to change your  treatment, we will call you to review the results.  Testing/Procedures: none  Follow-Up: At [facility], you and your health needs are our priority.  As part of our continuing mission to provide you with exceptional heart care, we have created designated Provider Care Teams.  These Care Teams include your primary Cardiologist (physician) and Advanced Practice Providers (APPs -  Physician Assistants and Nurse Practitioners) who all work together to provide you with the care you need, when you need it.  Your next appointment:   4 month(s)  The format for your next appointment:   Either In Person or Virtual  Provider:   You may see Minus Breeding, MD or one of the following Advanced Practice Providers on your designated Care Team:    Rosaria Ferries, PA-C  Jory Sims, DNP, ANP  Cadence Kathlen Mody, NP   Other Instructions Your next visit with Nell Range, PA-C will be in one year.     Signed, Angelena Form, PA-C  06/05/2019 7:05 PM    [facility] [address], [place], [place]  [address] Phone: [phone]; Fax: [phone]

## 2019-06-05 ENCOUNTER — Ambulatory Visit (INDEPENDENT_AMBULATORY_CARE_PROVIDER_SITE_OTHER): Payer: Medicare Other | Admitting: Physician Assistant

## 2019-06-05 ENCOUNTER — Ambulatory Visit (HOSPITAL_COMMUNITY): Payer: Medicare Other | Attending: Cardiology

## 2019-06-05 ENCOUNTER — Other Ambulatory Visit: Payer: Self-pay

## 2019-06-05 ENCOUNTER — Encounter: Payer: Self-pay | Admitting: Physician Assistant

## 2019-06-05 VITALS — BP 116/80 | HR 75 | Ht 68.0 in | Wt 262.0 lb

## 2019-06-05 DIAGNOSIS — Z952 Presence of prosthetic heart valve: Secondary | ICD-10-CM | POA: Insufficient documentation

## 2019-06-05 DIAGNOSIS — R079 Chest pain, unspecified: Secondary | ICD-10-CM

## 2019-06-05 DIAGNOSIS — G4733 Obstructive sleep apnea (adult) (pediatric): Secondary | ICD-10-CM

## 2019-06-05 DIAGNOSIS — Z9989 Dependence on other enabling machines and devices: Secondary | ICD-10-CM

## 2019-06-05 DIAGNOSIS — I5032 Chronic diastolic (congestive) heart failure: Secondary | ICD-10-CM | POA: Diagnosis not present

## 2019-06-05 MED ORDER — CLOPIDOGREL BISULFATE 75 MG PO TABS: 75.0000 mg | ORAL_TABLET | Freq: Every day | ORAL | 1 refills | Status: AC

## 2019-06-05 NOTE — Patient Instructions (Signed)
Medication Instructions:  Your physician has recommended you make the following change in your medication:  1.) stop Plavix (clopidogrel) in May when you finish out that bottle  *If you need a refill on your cardiac medications before your next appointment, please call your pharmacy*  Lab Work: none If you have labs (blood work) drawn today and your tests are completely normal, you will receive your results only by: Marland Kitchen MyChart Message (if you have MyChart) OR . A paper copy in the mail If you have any lab test that is abnormal or we need to change your treatment, we will call you to review the results.  Testing/Procedures: none  Follow-Up: At [facility], you and your health needs are our priority.  As part of our continuing mission to provide you with exceptional heart care, we have created designated Provider Care Teams.  These Care Teams include your primary Cardiologist (physician) and Advanced Practice Providers (APPs -  Physician Assistants and Nurse Practitioners) who all work together to provide you with the care you need, when you need it.  Your next appointment:   4 month(s)  The format for your next appointment:   Either In Person or Virtual  Provider:   You may see Minus Breeding, MD or one of the following Advanced Practice Providers on your designated Care Team:    Rosaria Ferries, PA-C  Jory Sims, DNP, ANP  Cadence Kathlen Mody, NP   Other Instructions Your next visit with Nell Range, PA-C will be in one year.

## 2019-07-18 ENCOUNTER — Other Ambulatory Visit: Payer: Self-pay | Admitting: Student

## 2019-08-08 ENCOUNTER — Other Ambulatory Visit: Payer: Self-pay

## 2019-08-08 ENCOUNTER — Encounter: Payer: Self-pay | Admitting: Physical Therapy

## 2019-08-08 ENCOUNTER — Ambulatory Visit: Payer: Medicare Other | Attending: Obstetrics and Gynecology | Admitting: Physical Therapy

## 2019-08-08 DIAGNOSIS — N3942 Incontinence without sensory awareness: Secondary | ICD-10-CM | POA: Diagnosis present

## 2019-08-08 DIAGNOSIS — M6281 Muscle weakness (generalized): Secondary | ICD-10-CM | POA: Insufficient documentation

## 2019-08-08 DIAGNOSIS — R278 Other lack of coordination: Secondary | ICD-10-CM

## 2019-08-08 NOTE — Patient Instructions (Addendum)
Slow Contraction: Gravity Eliminated (Hook-Lying)    Lie with hips and knees bent. Slowly squeeze pelvic floor for __5_ seconds. Rest for __5_ seconds. Repeat __5_ times. Do __6_ times a day.   Copyright  VHI. All rights reserved.  [facility] [address] [place], [place] [address] Phone # [phone] Fax [phone]

## 2019-08-08 NOTE — Therapy (Signed)
[facility] [address], [place], [place], [address] Phone: [phone]   Fax:  [phone]  Physical Therapy Evaluation  Patient Details  Name: [name] MRN: [id number] Date of Birth: 1955/08/25 Referring Provider (PT): Dr. Dian Queen   Encounter Date: 08/08/2019  PT End of Session - 08/08/19 1707    Visit Number  1    Date for PT Re-Evaluation  10/03/19    Authorization Type  UHC Medicare    PT Start Time  [id number]    PT Stop Time  1700    PT Time Calculation (min)  45 min    Activity Tolerance  Patient tolerated treatment well    Behavior During Therapy  [facility] for tasks assessed/performed       Past Medical History:  Diagnosis Date  . Anxiety   . Arthritis    low back  . Cervical pain (neck)   . Complication of anesthesia    DIFFICULTY WAKING UP  . Coronary artery disease   . Depression   . Diverticulitis   . GERD (gastroesophageal reflux disease)   . Hyperlipidemia   . Insomnia   . Morbid obesity ([place])   . OSA on CPAP   . PONV (postoperative nausea and vomiting)   . Post-polio syndrome    With left leg paralysis  . S/P TAVR (transcatheter aortic valve replacement)   . Severe aortic stenosis   . Sigmoid diverticulosis     Past Surgical History:  Procedure Laterality Date  . ANKLE FUSION     left  . APPENDECTOMY    . CHOLECYSTECTOMY    . EYE SURGERY Bilateral 2020   cataracts removed August and September 2020  . JOINT REPLACEMENT Right 2003   three replacements; 2003 is last one  . KNEE ARTHROPLASTY    . LEG SURGERY     muscle surgery related to polio  . RECTAL PROLAPSE REPAIR  2020   pessary placed  . RIGHT/LEFT HEART CATH AND CORONARY ANGIOGRAPHY N/A 04/02/2019   Procedure: RIGHT/LEFT HEART CATH AND CORONARY ANGIOGRAPHY;  Surgeon: Nelva Bush, MD;  Location: [place] [facility];  Service: Cardiovascular;  Laterality: N/A;  . TEE WITHOUT CARDIOVERSION N/A 04/17/2019   Procedure: TRANSESOPHAGEAL ECHOCARDIOGRAM (TEE);  Surgeon: Sherren Mocha, MD;  Location: [place] [facility];  Service: Open Heart Surgery;  Laterality: N/A;  . TOTAL HIP ARTHROPLASTY     right x 3  . TRANSCATHETER AORTIC VALVE REPLACEMENT, TRANSFEMORAL N/A 04/17/2019   Procedure: TRANSCATHETER AORTIC VALVE REPLACEMENT, TRANSFEMORAL;  Surgeon: Sherren Mocha, MD;  Location: [place] [facility];  Service: Open Heart Surgery;  Laterality: N/A;  . UMBILICAL HERNIA REPAIR      There were no vitals filed for this visit.   Subjective Assessment - 08/08/19 1622    Subjective  Patient reports she will wear a thick pad at night and unable to hold the urine to go to the bathroom. Patient reports increased urine leakage when she had a procedure last November. Patient has to wear a thick pain all the time. Tranfering in car will leak urine. When patient stands to go to the bathroom and loses her urine. Patient does not feel the urge to go to the bathroom until it is too full. Patient does not drink urine when out due to difficulty with handicap toilets. Patient has not seen a urologist. Patient has difficult time to push the stool out. Patient has to strain for a bowel movement. If patient shifts  her body to the left and pull on a bar overhead with right hand has helped her defecate. Patient has a pessary for the rectocele.    Patient Stated Goals  reduce urinary leakage to where she was last November    Currently in Pain?  No/denies         [facility] PT Assessment - 08/08/19 0001      Assessment   Medical Diagnosis  R32 Unspecified urinary incontinence    Referring Provider (PT)  Dr. Dian Queen    Onset Date/Surgical Date  04/17/19    Prior Therapy  none      Precautions   Precautions  Other (comment)    Precaution Comments  right hip replacement; polio, W/C bound      Balance Screen   Has the patient fallen in the past 6 months  No    Has the patient had a decrease in activity level because  of a fear of falling?   No    Is the patient reluctant to leave their home because of a fear of falling?   No      Home Film[place]video [employer] residence      Prior Function   Level of Independence  Independent      Cognition   Overall Cognitive Status  Within Functional Limits for tasks assessed      Posture/Postural Control   Posture/Postural Control  No significant limitations                Objective measurements completed on examination: See above findings.    Pelvic Floor Special Questions - 08/08/19 0001    Urinary Leakage  Yes    Activities that cause leaking  With strong urge;Coughing;Sneezing;Laughing;Bending;Other   standing to transfer onto commode   Urinary frequency  tries to go to the bathroom every 2 hours due to not feeling the urge    Falling out feeling (prolapse)  No   has a rectocele with a pessary   Skin Integrity  Intact   dry   Pelvic Floor Internal Exam  Patient confirms identification and approves PT to assess pelvic floor and treatment    Exam Type  Vaginal    Palpation  patient does not feel her contracting the pelvic floor    Strength  Flicker    Strength # of seconds  5               PT Education - 08/08/19 1706    Education Details  pelvic floor contraction in supine    Person(s) Educated  Patient    Methods  Explanation;Demonstration;Verbal cues;Handout    Comprehension  Returned demonstration;Verbalized understanding       PT Short Term Goals - 08/08/19 1716      PT SHORT TERM GOAL #1   Title  independent with initial HEP    Time  4    Period  Weeks    Status  New    Target Date  09/05/19      PT SHORT TERM GOAL #2   Title  understand how to transfer without holding breath so she is not putting pressure on her pelvic floor    Time  4    Period  Weeks    Status  New    Target Date  09/05/19      PT SHORT TERM GOAL #3   Title  understand what bladder irritants are and how they affect the  bladder  Time  4    Period  Weeks    Status  New    Target Date  09/05/19        PT Long Term Goals - 08/08/19 1718      PT LONG TERM GOAL #1   Title  independent with advanced HEP    Time  8    Period  Weeks    Status  New    Target Date  10/03/19      PT LONG TERM GOAL #2   Title  when transfer from the wheelchair to the commode urinary leakage decreased >/= 50%    Time  8    Period  Weeks    Status  New    Target Date  10/03/19      PT LONG TERM GOAL #3   Title  awareness of her leaking urine due to increased sensation from increasing the pelvic floor strength    Time  8    Period  Weeks    Status  New    Target Date  10/03/19      PT LONG TERM GOAL #4   Title  pelvic floor strength >/= 2/5 holding for 5 seconds so she is able to reduce her urinary leakage to the level in 03/15/2019    Time  8    Period  Weeks    Status  New    Target Date  10/03/19             Plan - 08/08/19 1708    Clinical Impression Statement  Patient is a 64 year old female with urinary leakage and wheelchair bound. Patient reports her leakage has gotten worse after 04/17/2019 after she had a procedure. Patient will leak urine when she does not realize it, when she is transfering from her chair to the commode, when transfering from her bed to the wheelchair, and moving around in bed. Pelvic floor strength is 1/5 and she does not feel the pelvic floor muscles contracting. Patient was able to transfer from the wheelchair to the mat independently but needed verbal cues to breath so she does not place pressure on the bladder. Patient has a hystory of polio and she has decreased strength of the left leg and has to assist it for transfers. Patient wears a pad all day and will change it several times due to urinary leakage. Patient has a rectocele that is supported by a pessary. Patient will benefit from skilled therapy to improve pelvic floor strength and coordination and correct body mechanics to  reduce strain on the pelvic floor.    Personal Factors and Comorbidities  Age;Fitness;Comorbidity 3+    Comorbidities  post polio syndrome with left leg paralysis, right THR, hernia, rectal prolapse    Examination-Activity Limitations  Bed Mobility;Locomotion Level;Bathing;Toileting;Continence    Stability/Clinical Decision Making  Evolving/Moderate complexity    Clinical Decision Making  Moderate    Rehab Potential  Good    PT Frequency  Mon[place]    PT Duration  --   2 months   PT Treatment/Interventions  ADLs/Self Care Home Management;Biofeedback;Patient/family education;Manual techniques;Neuromuscular re-education;Therapeutic exercise;Therapeutic activities    PT Next Visit Plan  work on pelvic floor contraction with overflow, bladder irritants, mechanics with transfers with breathing    Consulted and Agree with Plan of Care  Patient       Patient will benefit from skilled therapeutic intervention in order to improve the following deficits and impairments:  Decreased coordination, Increased fascial restricitons, Decreased  strength  Visit Diagnosis: Muscle weakness (generalized) - Plan: PT plan of care cert/re-cert  Other lack of coordination - Plan: PT plan of care cert/re-cert  Urinary incontinence without sensory awareness - Plan: PT plan of care cert/re-cert     Problem List Patient Active Problem List   Diagnosis Date Noted  . Back pain 04/20/2019  . Chest pain 04/19/2019  . Chronic diastolic CHF (congestive heart failure) ([place]) 04/17/2019  . S/P TAVR (transcatheter aortic valve replacement) 04/17/2019  . Post-polio syndrome   . Morbid obesity ([place])   . OSA on CPAP   . Severe aortic stenosis   . Hyperlipidemia   . GERD (gastroesophageal reflux disease)   . Depression with anxiety   . Obesity (BMI 35.0-39.9 without comorbidity) 04/12/2013    Earlie Counts, PT 08/08/19 5:23 PM   [facility] [address], [place], [place], [address] Phone: [phone]   Fax:  [phone]  Name: [name] MRN: [id number] Date of Birth: 1956/06/03

## 2019-08-13 ENCOUNTER — Telehealth: Payer: Self-pay | Admitting: Physician Assistant

## 2019-08-13 NOTE — Telephone Encounter (Signed)
  [facility] VALVE TEAM  Patient called into the structural heart office today with recurrent chest and back pain (through the shoulder blades).  This is the initial symptom that brought her into the hospital when she was diagnosed with severe AS. In the ER, EKG showed sinus withlateral and anterolateral ST-T wave abnormalities, more prominent anterolaterally compared to last tracings.CT negative for dissection. Heart cath negative for CAD. She underwent successfulBAV andTAVR with a44mm Edwards Sapien 3 UltraTHV via the TF approach. Post op echo showed EF 60-65%, normally functioning TAVR with mean gradient of 16.7 mm Hg and no PVL. She was discharged home on POD1. She then returned to [facility] ED on 04/18/19 with severe back and chest pain. She was admitted overnight for observation. Echo showed LVEF of55% with moderate LVHand no evidence of pericardial effusion. Mean TAVR gradientmean gradient measure 16.0 mmHg.Chest CTA negative for PE/ dissection. Chest pain and back pain resolved. Exact etiology of pain unclear. Felt to be possibly musculoskeletal.  She now calls again with similar pain. I really do not think this is cardiac related given recurrent symptoms with normal aorta on CT, echo normal and normal coronaries. She does have a persistently abnormal ECG as well. I have asked her to call her PCP for a visit. If he thinks she needs to be seen by [place], I will help facilitate getting an earlier appt.   Angelena Form PA-C  [facility]

## 2019-09-05 ENCOUNTER — Encounter: Payer: Self-pay | Admitting: Physical Therapy

## 2019-09-05 ENCOUNTER — Ambulatory Visit: Payer: Medicare Other | Attending: Obstetrics and Gynecology | Admitting: Physical Therapy

## 2019-09-05 ENCOUNTER — Other Ambulatory Visit: Payer: Self-pay

## 2019-09-05 DIAGNOSIS — M6281 Muscle weakness (generalized): Secondary | ICD-10-CM | POA: Insufficient documentation

## 2019-09-05 DIAGNOSIS — N3942 Incontinence without sensory awareness: Secondary | ICD-10-CM | POA: Diagnosis present

## 2019-09-05 DIAGNOSIS — R278 Other lack of coordination: Secondary | ICD-10-CM | POA: Diagnosis present

## 2019-09-05 NOTE — Therapy (Signed)
[facility] [address], [place], [place], [address] Phone: [phone]   Fax:  [phone]  Physical Therapy Treatment  Patient Details  Name: [name] MRN: [id number] Date of Birth: [birth year] Referring Provider (PT): Dr. Dian Queen   Encounter Date: 09/05/2019  PT End of Session - 09/05/19 1244    Visit Number  2    Date for PT Re-Evaluation  10/03/19    Authorization Type  UHC Medicare    PT Start Time  4008    PT Stop Time  1230    PT Time Calculation (min)  45 min    Activity Tolerance  Patient tolerated treatment well    Behavior During Therapy  [facility] for tasks assessed/performed       Past Medical History:  Diagnosis Date  . Anxiety   . Arthritis    low back  . Cervical pain (neck)   . Complication of anesthesia    DIFFICULTY WAKING UP  . Coronary artery disease   . Depression   . Diverticulitis   . GERD (gastroesophageal reflux disease)   . Hyperlipidemia   . Insomnia   . Morbid obesity ([place])   . OSA on CPAP   . PONV (postoperative nausea and vomiting)   . Post-polio syndrome    With left leg paralysis  . S/P TAVR (transcatheter aortic valve replacement)   . Severe aortic stenosis   . Sigmoid diverticulosis     Past Surgical History:  Procedure Laterality Date  . ANKLE FUSION     left  . APPENDECTOMY    . CHOLECYSTECTOMY    . EYE SURGERY Bilateral 2020   cataracts removed August and September 2020  . JOINT REPLACEMENT Right 2003   three replacements; 2003 is last one  . KNEE ARTHROPLASTY    . LEG SURGERY     muscle surgery related to polio  . RECTAL PROLAPSE REPAIR  2020   pessary placed  . RIGHT/LEFT HEART CATH AND CORONARY ANGIOGRAPHY N/A 04/02/2019   Procedure: RIGHT/LEFT HEART CATH AND CORONARY ANGIOGRAPHY;  Surgeon: Nelva Bush, MD;  Location: [place] [facility];  Service: Cardiovascular;  Laterality: N/A;  . TEE WITHOUT CARDIOVERSION N/A 04/17/2019   Procedure: TRANSESOPHAGEAL ECHOCARDIOGRAM (TEE);  Surgeon: Sherren Mocha, MD;  Location: [place] [facility];  Service: Open Heart Surgery;  Laterality: N/A;  . TOTAL HIP ARTHROPLASTY     right x 3  . TRANSCATHETER AORTIC VALVE REPLACEMENT, TRANSFEMORAL N/A 04/17/2019   Procedure: TRANSCATHETER AORTIC VALVE REPLACEMENT, TRANSFEMORAL;  Surgeon: Sherren Mocha, MD;  Location: [place] [facility];  Service: Open Heart Surgery;  Laterality: N/A;  . UMBILICAL HERNIA REPAIR      There were no vitals filed for this visit.  Subjective Assessment - 09/05/19 1149    Subjective  I have costochondritis. I am so much better since last visit. I am going to the bathroom every 2 hours. I am not having accidents in the morning. I have laid off my sugar. I see Dr. Runell Gess in 2 weeks. I do not having accidents anymore.    Patient Stated Goals  reduce urinary leakage to where she was last November    Currently in Pain?  No/denies         [facility] PT Assessment - 09/05/19 0001      Assessment   Medical Diagnosis  R32 Unspecified urinary incontinence    Referring Provider (PT)  Dr. Dian Queen    Onset Date/Surgical Date  04/17/19    Prior Therapy  none      Precautions   Precautions  Other (comment)    Precaution Comments  right hip replacement; polio, W/C bound      [place] residence      Prior Function   Level of Independence  Independent      Cognition   Overall Cognitive Status  Within Functional Limits for tasks assessed      Posture/Postural Control   Posture/Postural Control  No significant limitations                Pelvic Floor Special Questions - 09/05/19 0001    Urinary Leakage  No    Urinary frequency  urinates every 2-3 hours    Exam Type  Deferred   unable to get on the table due to pain on the sternum       [facility] Adult PT Treatment/Exercise - 09/05/19 0001      Self-Care   Self-Care  Other Self-Care Comments    Other  Self-Care Comments   discussed of different solutions to use with her toilet to reduce the strain on her arms and rib cage., discussed other solutions for toileting to use a female urinal      Therapeutic Activites    Therapeutic Activities  Other Therapeutic Activities    Other Therapeutic Activities  tranfering with breath to not put pressure on the bladder and cause urinary leakage      Neuro Re-ed    Neuro Re-ed Details   using her fingers in the vaginal to see if she is contracting the pelvic floor correctly or to stop the flow of urine             PT Education - 09/05/19 1243    Education Details  transfering, ways to feel the pelvic floor contraction, other modifications she can do to her bathroom    Person(s) Educated  Patient    Methods  Explanation    Comprehension  Verbalized understanding       PT Short Term Goals - 09/05/19 1247      PT SHORT TERM GOAL #1   Title  independent with initial HEP    Time  4    Period  Weeks    Status  Achieved      PT SHORT TERM GOAL #2   Title  understand how to transfer without holding breath so she is not putting pressure on her pelvic floor    Time  4    Period  Weeks    Status  Achieved      PT SHORT TERM GOAL #3   Title  understand what bladder irritants are and how they affect the bladder    Time  4    Period  Weeks    Status  Achieved        PT Long Term Goals - 09/05/19 1248      PT LONG TERM GOAL #1   Title  independent with advanced HEP    Time  8    Period  Weeks    Status  Achieved      PT LONG TERM GOAL #2   Title  when transfer from the wheelchair to the commode urinary leakage decreased >/= 50%    Time  8    Period  Weeks    Status  Achieved      PT LONG TERM GOAL #3   Title  awareness of her leaking urine due to increased sensation from increasing the pelvic floor strength    Time  8    Period  Weeks    Status  Achieved      PT LONG TERM GOAL #4   Title  pelvic floor strength >/= 2/5  holding for 5 seconds so she is able to reduce her urinary leakage to the level in 03/15/2019    Time  8    Period  Weeks    Status  Deferred   not assessed           Plan - 09/05/19 1244    Clinical Impression Statement  Patient reports she is not having urinary leakage. Patient is doing timed voiding every 2-3 hours. Patient is able to sleep through the night due to not having the urge while sleeping. Patient is now feeling the urge to urinate a little. Patient is now breathing better with transfers. Patient has reduced her sugar intake and feels she has lost some weight. Patient does have costochondritis due to transfering to the commode more. Patient has met her goals.    Personal Factors and Comorbidities  Age;Fitness;Comorbidity 3+    Comorbidities  post polio syndrome with left leg paralysis, right THR, hernia, rectal prolapse    Examination-Activity Limitations  Bed Mobility;Locomotion Level;Bathing;Toileting;Continence    Stability/Clinical Decision Making  Evolving/Moderate complexity    Rehab Potential  Good    PT Treatment/Interventions  ADLs/Self Care Home Management;Biofeedback;Patient/family education;Manual techniques;Neuromuscular re-education;Therapeutic exercise;Therapeutic activities    PT Next Visit Plan  Discharge to her HEP    Recommended Other Services  MD signed the initial summary    Consulted and Agree with Plan of Care  Patient       Patient will benefit from skilled therapeutic intervention in order to improve the following deficits and impairments:  Decreased coordination, Increased fascial restricitons, Decreased strength  Visit Diagnosis: Muscle weakness (generalized)  Other lack of coordination  Urinary incontinence without sensory awareness     Problem List Patient Active Problem List   Diagnosis Date Noted  . Back pain 04/20/2019  . Chest pain 04/19/2019  . Chronic diastolic CHF (congestive heart failure) ([place]) 04/17/2019  . S/P TAVR  (transcatheter aortic valve replacement) 04/17/2019  . Post-polio syndrome   . Morbid obesity ([place])   . OSA on CPAP   . Severe aortic stenosis   . Hyperlipidemia   . GERD (gastroesophageal reflux disease)   . Depression with anxiety   . Obesity (BMI 35.0-39.9 without comorbidity) 04/12/2013    Earlie Counts, PT 09/05/19 12:49 PM   [facility] [address], [place], [place], [address] Phone: [phone]   Fax:  [phone]  Name: [name] MRN: [id number] Date of Birth: 19-Dec-1955  PHYSICAL THERAPY DISCHARGE SUMMARY  Visits from Start of Care: 2  Current functional level related to goals / functional outcomes: See above.    Remaining deficits: See above.    Education / Equipment: HEP Plan: Patient agrees to discharge.  Patient goals were met. Patient is being discharged due to meeting the stated rehab goals. Thank you for the referral. Earlie Counts, PT 09/05/19 12:50 PM   ?????

## 2019-09-26 ENCOUNTER — Encounter: Payer: Medicare Other | Admitting: Physical Therapy

## 2019-10-03 DIAGNOSIS — Z7189 Other specified counseling: Secondary | ICD-10-CM | POA: Insufficient documentation

## 2019-10-03 NOTE — Progress Notes (Signed)
Cardiology Office Note   Date:  10/04/2019   ID:  Makaylan, Stiltner [birth year], MRN [id number]  PCP:  [name], MD  Cardiologist:   Minus Breeding, MD  Chief Complaint  Patient presents with  . Aortic Stenosis      History of Present Illness: [name] is a 64 y.o. female who presents for follow up of severe aortic stenosiss/p TAVR (04/17/19).  She has done very well.  She did have some back pain and required another ER visit and there was no clear etiology to that that is the same pain that prompted her to present in the first place.  She otherwise has done quite well.  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  She lives independently and is very functional.  She has had no new chest pressure, neck or arm discomfort.  She has no shortness of breath, PND or orthopnea.  She said no weight gain or new edema.   Past Medical History:  Diagnosis Date  . Anxiety   . Arthritis    low back  . Cervical pain (neck)   . Complication of anesthesia    DIFFICULTY WAKING UP  . Coronary artery disease   . Depression   . Diverticulitis   . GERD (gastroesophageal reflux disease)   . Hyperlipidemia   . Insomnia   . Morbid obesity ([place])   . OSA on CPAP   . PONV (postoperative nausea and vomiting)   . Post-polio syndrome    With left leg paralysis  . S/P TAVR (transcatheter aortic valve replacement)   . Severe aortic stenosis   . Sigmoid diverticulosis     Past Surgical History:  Procedure Laterality Date  . ANKLE FUSION     left  . APPENDECTOMY    . CHOLECYSTECTOMY    . EYE SURGERY Bilateral 2020   cataracts removed August and September 2020  . JOINT REPLACEMENT Right 2003   three replacements; 2003 is last one  . KNEE ARTHROPLASTY    . LEG SURGERY     muscle surgery related to polio  . RECTAL PROLAPSE REPAIR  2020   pessary placed  . RIGHT/LEFT HEART CATH AND CORONARY ANGIOGRAPHY N/A 04/02/2019   Procedure: RIGHT/LEFT HEART CATH  AND CORONARY ANGIOGRAPHY;  Surgeon: Nelva Bush, MD;  Location: [facility];  Service: Cardiovascular;  Laterality: N/A;  . TEE WITHOUT CARDIOVERSION N/A 04/17/2019   Procedure: TRANSESOPHAGEAL ECHOCARDIOGRAM (TEE);  Surgeon: Sherren Mocha, MD;  Location: [facility];  Service: Open Heart Surgery;  Laterality: N/A;  . TOTAL HIP ARTHROPLASTY     right x 3  . TRANSCATHETER AORTIC VALVE REPLACEMENT, TRANSFEMORAL N/A 04/17/2019   Procedure: TRANSCATHETER AORTIC VALVE REPLACEMENT, TRANSFEMORAL;  Surgeon: Sherren Mocha, MD;  Location: [facility];  Service: Open Heart Surgery;  Laterality: N/A;  . UMBILICAL HERNIA REPAIR       Current Outpatient Medications  Medication Sig Dispense Refill  . ALPRAZolam (XANAX) 0.5 MG tablet Take 0.5 mg by mouth 2 (two) times daily as needed for sleep.     Marland Kitchen aspirin 81 MG chewable tablet Chew 1 tablet (81 mg total) by mouth daily.    . cetirizine (ZYRTEC) 10 MG tablet Take 10 mg by mouth as needed for allergies.    . Cholecalciferol (D3 HIGH POTENCY) 1000 UNITS capsule Take 2,000 Units by mouth daily.     . clopidogrel (PLAVIX) 75 MG tablet Take 1 tablet (75 mg  total) by mouth daily with breakfast. 90 tablet 1  . docusate sodium (COLACE) 100 MG capsule Take 100 mg by mouth daily.    . fluticasone (FLONASE) 50 MCG/ACT nasal spray Place 1 spray into both nostrils as needed for allergies or rhinitis.    Marland Kitchen gabapentin (NEURONTIN) 300 MG capsule Take 300 mg by mouth daily.    . pantoprazole (PROTONIX) 40 MG tablet Take 40 mg by mouth daily.    . traZODone (DESYREL) 100 MG tablet Take 100 mg by mouth at bedtime.     Marland Kitchen venlafaxine XR (EFFEXOR-XR) 75 MG 24 hr capsule Take 75 mg by mouth daily with breakfast.    . vitamin B-12 (CYANOCOBALAMIN) 250 MCG tablet Take 250 mcg by mouth daily.    Marland Kitchen azithromycin (ZITHROMAX) 500 MG tablet Take 1 tablet (500 mg total) by mouth once for 1 dose. TAKE 1 HOUR BEFORE DENTAL PROCEDURES. 1 tablet 3   No current  facility-administered medications for this visit.    Allergies:   Amoxicillin, Flagyl [metronidazole], Plaquenil [hydroxychloroquine sulfate], Codeine, Doxycycline, Other, Percocet [oxycodone-acetaminophen], and Sulfa antibiotics    ROS:  Please see the history of present illness.   Otherwise, review of systems are positive for none.   All other systems are reviewed and negative.    PHYSICAL EXAM: VS:  BP 108/68   Pulse 70   Ht 5' 8.5" (1.74 m)   Wt 262 lb (118.8 kg) Comment: per pt report (unable to stand)  SpO2 97%   BMI 39.26 kg/m  , BMI Body mass index is 39.26 kg/m. GEN:  No distress NECK:  No jugular venous distention at 90 degrees, waveform within normal limits, carotid upstroke brisk and symmetric, no bruits, no thyromegaly LYMPHATICS:  No cervical adenopathy LUNGS:  Clear to auscultation bilaterally BACK:  No CVA tenderness CHEST:  Unremarkable HEART:  S1 and S2 within normal limits, no S3, no S4, no clicks, no rubs, very soft apical systolic murmur heard best at the right upper sternal border, no diastolic murmurs ABD:  Positive bowel sounds normal in frequency in pitch, no bruits, no rebound, no guarding, unable to assess midline mass or bruit with the patient seated. EXT:  2 plus pulses throughout, trace edema, no cyanosis no clubbing SKIN:  No rashes no nodules NEURO:  Cranial nerves II through XII grossly intact, motor grossly intact throughout PSYCH:  Cognitively intact, oriented to person place and time  EKG:  EKG is ordered today. The ekg ordered today demonstrates sinus rhythm, rate 71, leftward axis, early transition in V2, nonspecific bilateral T wave flattening.   Recent Labs: 04/13/2019: ALT 16; B Natriuretic Peptide 152.1 05/18/2019: BUN 16; Creatinine, Ser 0.52; Hemoglobin 14.7; Platelets 256; Potassium 3.9; Sodium 136    Lipid Panel    Component Value Date/Time   CHOL 201 (H) 03/31/2019 0607   TRIG 93 03/31/2019 0607   HDL 44 03/31/2019 0607    CHOLHDL 4.6 03/31/2019 0607   VLDL 19 03/31/2019 0607   LDLCALC 138 (H) 03/31/2019 0607      Wt Readings from Last 3 Encounters:  10/04/19 262 lb (118.8 kg)  06/05/19 262 lb (118.8 kg)  05/18/19 262 lb 5.6 oz (119 kg)      Other studies Reviewed: Additional studies/ records that were reviewed today include: [facility] notes. Review of the above records demonstrates:  Please see elsewhere in the note.     ASSESSMENT AND PLAN:  Severe AS/status post TAVR:   She is done well.  She understands endocarditis prophylaxis.  She did not tolerate clindamycin so we are going to give her Zithromax.  She had stable follow-up echo post procedure.  Otherwise no change in therapy.   She will stop her Plavix in the beginning of May.  Chest/back pain: Has had no further recurrence of this.  She had an extensive work-up when she presented.  Transient T wave inversions have resolved.   Chronic diastolic CHF:  She seems to be euvolemic.  No change in therapy.   OSA:  She wears CPAP.  CPAP  Covid education: She has been vaccinated.   Current medicines are reviewed at length with the patient today.  The patient does not have concerns regarding medicines.  The following changes have been made:  no change  Labs/ tests ordered today include:   Orders Placed This Encounter  Procedures  . EKG 12-Lead     Disposition:   FU with APP in 3 months.     Signed, Minus Breeding, MD  10/04/2019 1:25 PM    [facility]

## 2019-10-04 ENCOUNTER — Ambulatory Visit: Payer: Medicare Other | Admitting: Cardiology

## 2019-10-04 ENCOUNTER — Other Ambulatory Visit: Payer: Self-pay

## 2019-10-04 ENCOUNTER — Encounter: Payer: Self-pay | Admitting: Cardiology

## 2019-10-04 VITALS — BP 108/68 | HR 70 | Ht 68.5 in | Wt 262.0 lb

## 2019-10-04 DIAGNOSIS — I5032 Chronic diastolic (congestive) heart failure: Secondary | ICD-10-CM | POA: Diagnosis not present

## 2019-10-04 DIAGNOSIS — R072 Precordial pain: Secondary | ICD-10-CM | POA: Diagnosis not present

## 2019-10-04 DIAGNOSIS — Z7189 Other specified counseling: Secondary | ICD-10-CM

## 2019-10-04 DIAGNOSIS — Z952 Presence of prosthetic heart valve: Secondary | ICD-10-CM

## 2019-10-04 MED ORDER — AZITHROMYCIN 500 MG PO TABS: 500.0000 mg | ORAL_TABLET | Freq: Once | ORAL | 3 refills | Status: AC

## 2019-10-04 NOTE — Patient Instructions (Addendum)
Medication Instructions:  TAKE ZITHROMAX 500MG  1 HOUR BEFORE YOUR DENTAL PROCEDURE STOP PLAVIX AT THE BEGINNING OF MAY *If you need a refill on your cardiac medications before your next appointment, please call your pharmacy*  Lab Work: NONE ORDERED THIS VISIT  Testing/Procedures: NONE ORDERED THIS VISIT  Follow-Up: At [facility], you and your health needs are our priority.  As part of our continuing mission to provide you with exceptional heart care, we have created designated Provider Care Teams.  These Care Teams include your primary Cardiologist (physician) and Advanced Practice Providers (APPs -  Physician Assistants and Nurse Practitioners) who all work together to provide you with the care you need, when you need it.  Your next appointment:   3 month(s)  The format for your next appointment:   In Person  Provider:   HAO MENG, PA-C Kerin Ransom PA-C  Other Instructions SEE DR. HOCHREIN IN 6 MONTHS

## 2019-11-06 ENCOUNTER — Other Ambulatory Visit: Payer: Self-pay | Admitting: Surgery

## 2019-11-06 DIAGNOSIS — R2241 Localized swelling, mass and lump, right lower limb: Secondary | ICD-10-CM

## 2019-11-07 ENCOUNTER — Encounter: Payer: Medicare Other | Admitting: Physical Therapy

## 2019-11-22 ENCOUNTER — Ambulatory Visit
Admission: RE | Admit: 2019-11-22 | Discharge: 2019-11-22 | Disposition: A | Discharge status: Home / Self Care | Payer: Medicare Other | Source: Ambulatory Visit | Attending: Surgery | Admitting: Surgery

## 2019-11-22 DIAGNOSIS — R2241 Localized swelling, mass and lump, right lower limb: Secondary | ICD-10-CM

## 2019-11-22 MED ORDER — IOPAMIDOL (ISOVUE-300) INJECTION 61%
125.0000 mL | Freq: Once | INTRAVENOUS | Status: AC | PRN
  Administered 2019-11-22: 125 mL via INTRAVENOUS

## 2019-11-27 ENCOUNTER — Ambulatory Visit: Payer: Self-pay | Admitting: Surgery

## 2019-12-28 ENCOUNTER — Encounter: Payer: Self-pay | Admitting: Cardiology

## 2019-12-28 ENCOUNTER — Other Ambulatory Visit: Payer: Self-pay

## 2019-12-28 ENCOUNTER — Ambulatory Visit (INDEPENDENT_AMBULATORY_CARE_PROVIDER_SITE_OTHER): Payer: Medicare Other | Admitting: Cardiology

## 2019-12-28 VITALS — BP 130/96 | HR 65 | Ht 68.0 in | Wt 262.0 lb

## 2019-12-28 DIAGNOSIS — E669 Obesity, unspecified: Secondary | ICD-10-CM

## 2019-12-28 DIAGNOSIS — Z952 Presence of prosthetic heart valve: Secondary | ICD-10-CM | POA: Diagnosis not present

## 2019-12-28 DIAGNOSIS — G14 Postpolio syndrome: Secondary | ICD-10-CM | POA: Diagnosis not present

## 2019-12-28 DIAGNOSIS — R0782 Intercostal pain: Secondary | ICD-10-CM

## 2019-12-28 DIAGNOSIS — G4733 Obstructive sleep apnea (adult) (pediatric): Secondary | ICD-10-CM | POA: Diagnosis not present

## 2019-12-28 DIAGNOSIS — Z9989 Dependence on other enabling machines and devices: Secondary | ICD-10-CM

## 2019-12-28 NOTE — Assessment & Plan Note (Signed)
Compliant 

## 2019-12-28 NOTE — Assessment & Plan Note (Signed)
TAVR 04/17/2019-doing well,  f/u echo was done 06/05/2019

## 2019-12-28 NOTE — Assessment & Plan Note (Signed)
Post TAVR- costochondritis

## 2019-12-28 NOTE — Patient Instructions (Signed)
Medication Instructions:  Your physician recommends that you continue on your current medications as directed. Please refer to the Current Medication list given to you today.  *If you need a refill on your cardiac medications before your next appointment, please call your pharmacy*   Follow-Up: At [facility], you and your health needs are our priority.  As part of our continuing mission to provide you with exceptional heart care, we have created designated Provider Care Teams.  These Care Teams include your primary Cardiologist (physician) and Advanced Practice Providers (APPs -  Physician Assistants and Nurse Practitioners) who all work together to provide you with the care you need, when you need it.  We recommend signing up for the patient portal called "MyChart".  Sign up information is provided on this After Visit Summary.  MyChart is used to connect with patients for Virtual Visits (Telemedicine).  Patients are able to view lab/test results, encounter notes, upcoming appointments, etc.  Non-urgent messages can be sent to your provider as well.   To learn more about what you can do with MyChart, go to [email or web address].    Your next appointment:   6 month(s)  The format for your next appointment:   In Person  Provider:   You may see Minus Breeding, MD or one of the following Advanced Practice Providers on your designated Care Team:    Rosaria Ferries, PA-C  Jory Sims, DNP, ANP  Cadence Kathlen Mody, PA-C    Other Instructions Please call our office 2 months in advance to schedule your follow-up appointment.

## 2019-12-28 NOTE — Progress Notes (Signed)
Cardiology Office Note:    Date:  12/28/2019   ID:  [name], DOB 22-Mar-1956, MRN [id number]  PCP:  [name], MD  Cardiologist:  Minus Breeding, MD  Electrophysiologist:  None   Referring MD: [name], MD   No chief complaint on file.   History of Present Illness:    [name] is a 64 y.o. female with a hx of post polio syndrome, wheelchair-bound, chronic diastolic heart failure, obesity and sleep apnea on CPAP, and severe aortic stenosis.  Patient presented in October 2020 with back pain and chest pain.  She had some transient EKG changes.  Her troponins were slightly elevated.  Echocardiogram suggested severe aortic stenosis.  She underwent right and left heart cath on April 02, 2019.  She had normal coronaries with severe AS with a mean gradient of 65 mmHg and an aortic valve area of 0.5 sq cm.  On April 17, 2019 she underwent TAVR.  She was discharged and then readmitted April 19, 2019 with severe back pain.  She again had some transient EKG changes with diffuse T wave inversions.  Echocardiogram showed normal LV function with no pericardial effusion and no wall motion abnormality.  She ws ultimately diagnosed with costochondritis.  She has been seen in follow-up in the TAVR clinic and saw Dr. Percival Spanish in April 2021.    She is in the office today for routine follow up.  Her symptoms of chest and back pain have improved. She tells me she can definitely tell a difference in her energy level since her TAVR.      Past Medical History:  Diagnosis Date  . Anxiety   . Arthritis    low back  . Cervical pain (neck)   . Complication of anesthesia    DIFFICULTY WAKING UP  . Coronary artery disease   . Depression   . Diverticulitis   . GERD (gastroesophageal reflux disease)   . Hyperlipidemia   . Insomnia   . Morbid obesity ([place])   . OSA on CPAP   . PONV (postoperative nausea and vomiting)   . Post-polio syndrome    With left leg paralysis  . S/P TAVR  (transcatheter aortic valve replacement)   . Severe aortic stenosis   . Sigmoid diverticulosis     Past Surgical History:  Procedure Laterality Date  . ANKLE FUSION     left  . APPENDECTOMY    . CHOLECYSTECTOMY    . EYE SURGERY Bilateral 2020   cataracts removed August and September 2020  . JOINT REPLACEMENT Right 2003   three replacements; 2003 is last one  . KNEE ARTHROPLASTY    . LEG SURGERY     muscle surgery related to polio  . RECTAL PROLAPSE REPAIR  2020   pessary placed  . RIGHT/LEFT HEART CATH AND CORONARY ANGIOGRAPHY N/A 04/02/2019   Procedure: RIGHT/LEFT HEART CATH AND CORONARY ANGIOGRAPHY;  Surgeon: Nelva Bush, MD;  Location: [facility];  Service: Cardiovascular;  Laterality: N/A;  . TEE WITHOUT CARDIOVERSION N/A 04/17/2019   Procedure: TRANSESOPHAGEAL ECHOCARDIOGRAM (TEE);  Surgeon: Sherren Mocha, MD;  Location: [facility];  Service: Open Heart Surgery;  Laterality: N/A;  . TOTAL HIP ARTHROPLASTY     right x 3  . TRANSCATHETER AORTIC VALVE REPLACEMENT, TRANSFEMORAL N/A 04/17/2019   Procedure: TRANSCATHETER AORTIC VALVE REPLACEMENT, TRANSFEMORAL;  Surgeon: Sherren Mocha, MD;  Location: [place];  Service: Open Heart Surgery;  Laterality: N/A;  . UMBILICAL HERNIA REPAIR  Current Medications: Current Meds  Medication Sig  . ALPRAZolam (XANAX) 0.5 MG tablet Take 0.5 mg by mouth 2 (two) times daily as needed for sleep.   Marland Kitchen aspirin 81 MG chewable tablet Chew 1 tablet (81 mg total) by mouth daily.  . cetirizine (ZYRTEC) 10 MG tablet Take 10 mg by mouth as needed for allergies.  . Cholecalciferol (D3 HIGH POTENCY) 1000 UNITS capsule Take 2,000 Units by mouth daily.   Marland Kitchen docusate sodium (COLACE) 100 MG capsule Take 100 mg by mouth daily.  . fluticasone (FLONASE) 50 MCG/ACT nasal spray Place 1 spray into both nostrils as needed for allergies or rhinitis.  Marland Kitchen gabapentin (NEURONTIN) 300 MG capsule Take 300 mg by mouth daily.  .  pantoprazole (PROTONIX) 40 MG tablet Take 40 mg by mouth daily.  . traZODone (DESYREL) 100 MG tablet Take 100 mg by mouth at bedtime.   Marland Kitchen venlafaxine XR (EFFEXOR-XR) 75 MG 24 hr capsule Take 75 mg by mouth daily with breakfast.  . vitamin B-12 (CYANOCOBALAMIN) 250 MCG tablet Take 250 mcg by mouth daily.     Allergies:   Amoxicillin, Flagyl [metronidazole], Plaquenil [hydroxychloroquine sulfate], Codeine, Doxycycline, Other, Percocet [oxycodone-acetaminophen], and Sulfa antibiotics   Social History   Socioeconomic History  . Marital status: Single    Spouse name: Not on file  . Number of children: Not on file  . Years of education: Not on file  . Highest education level: Not on file  Occupational History  . Not on file  Tobacco Use  . Smoking status: Never Smoker  . Smokeless tobacco: Never Used  Vaping Use  . Vaping Use: Never used  Substance and Sexual Activity  . Alcohol use: No  . Drug use: Never  . Sexual activity: Not on file  Other Topics Concern  . Not on file  Social History Narrative   Lives alone.   Social Determinants of Health   Financial Resource Strain:   . Difficulty of Paying Living Expenses:   Food Insecurity:   . Worried About Charity fundraiser in the Last Year:   . Arboriculturist in the Last Year:   Transportation Needs:   . Film/video editor (Medical):   Marland Kitchen Lack of Transportation (Non-Medical):   Physical Activity:   . Days of Exercise per Week:   . Minutes of Exercise per Session:   Stress:   . Feeling of Stress :   Social Connections:   . Frequency of Communication with Friends and Family:   . Frequency of Social Gatherings with Friends and Family:   . Attends Religious Services:   . Active Member of Clubs or Organizations:   . Attends Archivist Meetings:   Marland Kitchen Marital Status:      Family History: The patient's family history includes CAD in an other family member; Cancer in her mother; Diabetes in an other family member;  Heart disease in her father; Hypertension in an other family member.  ROS:   Please see the history of present illness.     All other systems reviewed and are negative.  EKGs/Labs/Other Studies Reviewed:    The following studies were reviewed today:  Echo 06/05/2019- IMPRESSIONS    1. Left ventricular ejection fraction, by visual estimation, is 55 to  60%. The left ventricle has normal function. There is moderately increased  left ventricular hypertrophy.  2. Left ventricular diastolic parameters are consistent with Grade I  diastolic dysfunction (impaired relaxation).  3. The left ventricle has  no regional wall motion abnormalities.  4. Global right ventricle has normal systolic function.The right  ventricular size is normal. No increase in right ventricular wall  thickness.  5. Left atrial size was mildly dilated.  6. Right atrial size was normal.  7. The mitral valve is normal in structure. No evidence of mitral valve  regurgitation. No evidence of mitral stenosis.  8. The tricuspid valve is normal in structure. Tricuspid valve  regurgitation is not demonstrated.  9. Aortic valve mean gradient measures 11.5 mmHg.  10. Aortic valve regurgitation is not visualized. No evidence of aortic  valve sclerosis or stenosis.  11. S/p TAVR 65mm Edwards Sapien. Transaortic valve velocity 2.54m/s, mean  gradient 1mmHg.  12. The pulmonic valve was normal in structure. Pulmonic valve  regurgitation is not visualized.  13. The inferior vena cava is normal in size with greater than 50%  respiratory variability, suggesting right atrial pressure of 3 mmHg.  14. No significant change from prior study.   EKG:  EKG is not ordered today.  The ekg ordered 10/04/2019 demonstrates NSR, HR 71, TWI AVR  Recent Labs: 04/13/2019: ALT 16; B Natriuretic Peptide 152.1 05/18/2019: BUN 16; Creatinine, Ser 0.52; Hemoglobin 14.7; Platelets 256; Potassium 3.9; Sodium 136  Recent Lipid Panel     Component Value Date/Time   CHOL 201 (H) 03/31/2019 0607   TRIG 93 03/31/2019 0607   HDL 44 03/31/2019 0607   CHOLHDL 4.6 03/31/2019 0607   VLDL 19 03/31/2019 0607   LDLCALC 138 (H) 03/31/2019 0607    Physical Exam:    VS:  BP (!) 130/96   Pulse 65   Ht 5\' 8"  (1.727 m)   Wt 262 lb (118.8 kg)   SpO2 97%   BMI 39.84 kg/m     Wt Readings from Last 3 Encounters:  12/28/19 262 lb (118.8 kg)  10/04/19 262 lb (118.8 kg)  06/05/19 262 lb (118.8 kg)     GEN: Overweight Caucasian female in wheelchair, well developed in no acute distress HEENT: Normal NECK: No JVD; No carotid bruits CARDIAC: RRR, soft, short systolic murmur AOV, no rubs, gallops RESPIRATORY:  Clear to auscultation without rales, wheezing or rhonchi  MUSCULOSKELETAL:  No edema; No deformity  SKIN: Warm and dry NEUROLOGIC:  Alert and oriented x 3 PSYCHIATRIC:  Normal affect   ASSESSMENT:    S/P TAVR (transcatheter aortic valve replacement) TAVR 04/17/2019-doing well,  f/u echo was done 06/05/2019  Post-polio syndrome With left leg paralysis-wheelchair bound  Obesity (BMI 35.0-39.9 without comorbidity) BMI 39  OSA on CPAP Compliant  Chest pain Post TAVR- costochondritis   PLAN:    Repeat B/P by me 124/84- no change in Rx.  F/U Dr Percival Spanish in 6 months.    Medication Adjustments/Labs and Tests Ordered: Current medicines are reviewed at length with the patient today.  Concerns regarding medicines are outlined above.  No orders of the defined types were placed in this encounter.  No orders of the defined types were placed in this encounter.   Patient Instructions  Medication Instructions:  Your physician recommends that you continue on your current medications as directed. Please refer to the Current Medication list given to you today.  *If you need a refill on your cardiac medications before your next appointment, please call your pharmacy*   Follow-Up: At [facility], you and your health  needs are our priority.  As part of our continuing mission to provide you with exceptional heart care, we have created designated Provider Care  Teams.  These Care Teams include your primary Cardiologist (physician) and Advanced Practice Providers (APPs -  Physician Assistants and Nurse Practitioners) who all work together to provide you with the care you need, when you need it.  We recommend signing up for the patient portal called "MyChart".  Sign up information is provided on this After Visit Summary.  MyChart is used to connect with patients for Virtual Visits (Telemedicine).  Patients are able to view lab/test results, encounter notes, upcoming appointments, etc.  Non-urgent messages can be sent to your provider as well.   To learn more about what you can do with MyChart, go to [email or web address].    Your next appointment:   6 month(s)  The format for your next appointment:   In Person  Provider:   You may see Minus Breeding, MD or one of the following Advanced Practice Providers on your designated Care Team:    Rosaria Ferries, PA-C  Jory Sims, DNP, ANP  Cadence Kathlen Mody, PA-C    Other Instructions Please call our office 2 months in advance to schedule your follow-up appointment.     Angelena Form, PA-C  12/28/2019 8:47 AM    [facility]

## 2019-12-28 NOTE — Assessment & Plan Note (Signed)
With left leg paralysis-wheelchair bound

## 2019-12-28 NOTE — Assessment & Plan Note (Signed)
BMI 39 

## 2020-01-01 ENCOUNTER — Other Ambulatory Visit: Payer: Self-pay

## 2020-01-01 ENCOUNTER — Encounter (HOSPITAL_BASED_OUTPATIENT_CLINIC_OR_DEPARTMENT_OTHER): Payer: Self-pay | Admitting: Surgery

## 2020-01-01 NOTE — Progress Notes (Signed)
Chart reviewed with Dr Tobias Alexander, [place] for [facility]. Patient is w/c bound due to post polio syndrome. I will talk to pt about ability to make transfers from w/c. She does live independently.

## 2020-01-04 ENCOUNTER — Encounter (HOSPITAL_BASED_OUTPATIENT_CLINIC_OR_DEPARTMENT_OTHER)
Admission: RE | Admit: 2020-01-04 | Discharge: 2020-01-04 | Disposition: A | Discharge status: Home / Self Care | Payer: Medicare Other | Source: Ambulatory Visit | Attending: Surgery | Admitting: Surgery

## 2020-01-04 ENCOUNTER — Other Ambulatory Visit: Payer: Self-pay

## 2020-01-04 ENCOUNTER — Other Ambulatory Visit (HOSPITAL_COMMUNITY)
Admission: RE | Admit: 2020-01-04 | Discharge: 2020-01-04 | Disposition: A | Discharge status: Home / Self Care | Payer: Medicare Other | Source: Ambulatory Visit | Attending: Surgery | Admitting: Surgery

## 2020-01-04 DIAGNOSIS — Z01812 Encounter for preprocedural laboratory examination: Secondary | ICD-10-CM | POA: Insufficient documentation

## 2020-01-04 DIAGNOSIS — Z20822 Contact with and (suspected) exposure to covid-19: Secondary | ICD-10-CM | POA: Diagnosis not present

## 2020-01-04 LAB — COMPREHENSIVE METABOLIC PANEL
ALT: 18 U/L (ref 0–44)
AST: 22 U/L (ref 15–41)
Albumin: 3.6 g/dL (ref 3.5–5.0)
Alkaline Phosphatase: 95 U/L (ref 38–126)
Anion gap: 10 (ref 5–15)
BUN: 14 mg/dL (ref 8–23)
CO2: 23 mmol/L (ref 22–32)
Calcium: 9.1 mg/dL (ref 8.9–10.3)
Chloride: 106 mmol/L (ref 98–111)
Creatinine, Ser: 0.85 mg/dL (ref 0.44–1.00)
GFR calc Af Amer: >60 mL/min (ref >60)
GFR calc non Af Amer: >60 mL/min (ref >60)
Glucose, Bld: 134 mg/dL — ABNORMAL HIGH (ref 70–99)
Potassium: 4 mmol/L (ref 3.5–5.1)
Sodium: 139 mmol/L (ref 135–145)
Total Bilirubin: 0.8 mg/dL (ref 0.3–1.2)
Total Protein: 6.9 g/dL (ref 6.5–8.1)

## 2020-01-04 LAB — CBC WITH DIFFERENTIAL/PLATELET
Abs Immature Granulocytes: 0.02 10*3/uL (ref 0.00–0.07)
Basophils Absolute: 0 10*3/uL (ref 0.0–0.1)
Basophils Relative: 1 %
Eosinophils Absolute: 0.3 10*3/uL (ref 0.0–0.5)
Eosinophils Relative: 6 %
HCT: 45.1 % (ref 36.0–46.0)
Hemoglobin: 14.9 g/dL (ref 12.0–15.0)
Immature Granulocytes: 0 %
Lymphocytes Relative: 37 %
Lymphs Abs: 2.1 10*3/uL (ref 0.7–4.0)
MCH: 30.7 pg (ref 26.0–34.0)
MCHC: 33 g/dL (ref 30.0–36.0)
MCV: 92.8 fL (ref 80.0–100.0)
Monocytes Absolute: 0.4 10*3/uL (ref 0.1–1.0)
Monocytes Relative: 7 %
Neutro Abs: 2.7 10*3/uL (ref 1.7–7.7)
Neutrophils Relative %: 49 %
Platelets: 268 10*3/uL (ref 150–400)
RBC: 4.86 MIL/uL (ref 3.87–5.11)
RDW: 12.8 % (ref 11.5–15.5)
WBC: 5.6 10*3/uL (ref 4.0–10.5)
nRBC: 0 % (ref 0.0–0.2)

## 2020-01-04 LAB — SARS CORONAVIRUS 2 (TAT 6-24 HRS): SARS Coronavirus 2: NEGATIVE

## 2020-01-04 MED ORDER — CHLORHEXIDINE GLUCONATE CLOTH 2 % EX PADS: 6.0000 | MEDICATED_PAD | Freq: Once | CUTANEOUS | Status: DC

## 2020-01-04 NOTE — Progress Notes (Signed)
      Enhanced Recovery after Surgery for Orthopedics Enhanced Recovery after Surgery is a protocol used to improve the stress on your body and your recovery after surgery.  Patient Instructions  . The night before surgery:  o No food after midnight. ONLY clear liquids after midnight  . The day of surgery (if you do NOT have diabetes):  o Drink ONE (1) Pre-Surgery Clear Ensure by 0930  o This drink was given to you during your hospital  pre-op appointment visit. o The pre-op nurse will instruct you on the time to drink the  Pre-Surgery Ensure depending on your surgery time. o Finish the drink at the designated time by the pre-op nurse.  o Nothing else to drink after completing the  Pre-Surgery Clear Ensure.  . The day of surgery (if you have diabetes): o Drink ONE (1) Gatorade 2 (G2) as directed. o This drink was given to you during your hospital  pre-op appointment visit.  o The pre-op nurse will instruct you on the time to drink the   Gatorade 2 (G2) depending on your surgery time. o Color of the Gatorade may vary. Red is not allowed. o Nothing else to drink after completing the  Gatorade 2 (G2).         If you have questions, please contact your surgeon's office.

## 2020-01-08 ENCOUNTER — Encounter (HOSPITAL_BASED_OUTPATIENT_CLINIC_OR_DEPARTMENT_OTHER): Payer: Self-pay | Admitting: Surgery

## 2020-01-08 ENCOUNTER — Encounter (HOSPITAL_BASED_OUTPATIENT_CLINIC_OR_DEPARTMENT_OTHER)
Admission: RE | Disposition: A | Discharge status: Home / Self Care | Payer: Self-pay | Source: Home / Self Care | Attending: Surgery

## 2020-01-08 ENCOUNTER — Other Ambulatory Visit: Payer: Self-pay

## 2020-01-08 ENCOUNTER — Ambulatory Visit (HOSPITAL_BASED_OUTPATIENT_CLINIC_OR_DEPARTMENT_OTHER): Payer: Medicare Other | Admitting: Certified Registered"

## 2020-01-08 ENCOUNTER — Ambulatory Visit (HOSPITAL_BASED_OUTPATIENT_CLINIC_OR_DEPARTMENT_OTHER)
Admission: RE | Admit: 2020-01-08 | Discharge: 2020-01-08 | Disposition: A | Discharge status: Home / Self Care | Payer: Medicare Other | Attending: Surgery | Admitting: Surgery

## 2020-01-08 DIAGNOSIS — K219 Gastro-esophageal reflux disease without esophagitis: Secondary | ICD-10-CM | POA: Diagnosis not present

## 2020-01-08 DIAGNOSIS — Z96641 Presence of right artificial hip joint: Secondary | ICD-10-CM | POA: Insufficient documentation

## 2020-01-08 DIAGNOSIS — I2510 Atherosclerotic heart disease of native coronary artery without angina pectoris: Secondary | ICD-10-CM | POA: Insufficient documentation

## 2020-01-08 DIAGNOSIS — D219 Benign neoplasm of connective and other soft tissue, unspecified: Secondary | ICD-10-CM | POA: Diagnosis not present

## 2020-01-08 DIAGNOSIS — I83891 Varicose veins of right lower extremities with other complications: Secondary | ICD-10-CM | POA: Diagnosis not present

## 2020-01-08 DIAGNOSIS — Z6839 Body mass index (BMI) 39.0-39.9, adult: Secondary | ICD-10-CM | POA: Insufficient documentation

## 2020-01-08 DIAGNOSIS — F419 Anxiety disorder, unspecified: Secondary | ICD-10-CM | POA: Diagnosis not present

## 2020-01-08 DIAGNOSIS — F329 Major depressive disorder, single episode, unspecified: Secondary | ICD-10-CM | POA: Insufficient documentation

## 2020-01-08 DIAGNOSIS — Z7982 Long term (current) use of aspirin: Secondary | ICD-10-CM | POA: Insufficient documentation

## 2020-01-08 DIAGNOSIS — Z79899 Other long term (current) drug therapy: Secondary | ICD-10-CM | POA: Diagnosis not present

## 2020-01-08 DIAGNOSIS — Z952 Presence of prosthetic heart valve: Secondary | ICD-10-CM | POA: Insufficient documentation

## 2020-01-08 DIAGNOSIS — I509 Heart failure, unspecified: Secondary | ICD-10-CM | POA: Diagnosis not present

## 2020-01-08 DIAGNOSIS — G8314 Monoplegia of lower limb affecting left nondominant side: Secondary | ICD-10-CM | POA: Diagnosis not present

## 2020-01-08 DIAGNOSIS — R2241 Localized swelling, mass and lump, right lower limb: Secondary | ICD-10-CM | POA: Diagnosis present

## 2020-01-08 DIAGNOSIS — G4733 Obstructive sleep apnea (adult) (pediatric): Secondary | ICD-10-CM | POA: Insufficient documentation

## 2020-01-08 DIAGNOSIS — Z96659 Presence of unspecified artificial knee joint: Secondary | ICD-10-CM | POA: Insufficient documentation

## 2020-01-08 DIAGNOSIS — Z993 Dependence on wheelchair: Secondary | ICD-10-CM | POA: Diagnosis not present

## 2020-01-08 DIAGNOSIS — B91 Sequelae of poliomyelitis: Secondary | ICD-10-CM | POA: Diagnosis not present

## 2020-01-08 HISTORY — PX: MASS EXCISION: SHX2000

## 2020-01-08 HISTORY — DX: Localized swelling, mass and lump, right lower limb: R22.41

## 2020-01-08 SURGERY — EXCISION MASS
Anesthesia: General | Site: Leg Lower | Laterality: Right

## 2020-01-08 MED ORDER — ONDANSETRON HCL 4 MG/2ML IJ SOLN
INTRAMUSCULAR | Status: DC | PRN
  Administered 2020-01-08: 4 mg via INTRAVENOUS

## 2020-01-08 MED ORDER — CLINDAMYCIN PHOSPHATE 900 MG/50ML IV SOLN
900.0000 mg | INTRAVENOUS | Status: AC
  Administered 2020-01-08: 900 mg via INTRAVENOUS

## 2020-01-08 MED ORDER — CLINDAMYCIN PHOSPHATE 900 MG/50ML IV SOLN
INTRAVENOUS | Status: AC
  Filled 2020-01-08: qty 50

## 2020-01-08 MED ORDER — BUPIVACAINE HCL (PF) 0.25 % IJ SOLN
INTRAMUSCULAR | Status: DC | PRN
  Administered 2020-01-08: 13 mL

## 2020-01-08 MED ORDER — FENTANYL CITRATE (PF) 100 MCG/2ML IJ SOLN
INTRAMUSCULAR | Status: AC
  Filled 2020-01-08: qty 2

## 2020-01-08 MED ORDER — PROPOFOL 10 MG/ML IV BOLUS
INTRAVENOUS | Status: DC | PRN
  Administered 2020-01-08: 150 mg via INTRAVENOUS

## 2020-01-08 MED ORDER — LACTATED RINGERS IV SOLN: INTRAVENOUS | Status: DC

## 2020-01-08 MED ORDER — HYDROCODONE-ACETAMINOPHEN 7.5-325 MG PO TABS: 1.0000 | ORAL_TABLET | Freq: Once | ORAL | Status: DC | PRN

## 2020-01-08 MED ORDER — MIDAZOLAM HCL 2 MG/2ML IJ SOLN
INTRAMUSCULAR | Status: AC
  Filled 2020-01-08: qty 2

## 2020-01-08 MED ORDER — DEXAMETHASONE SODIUM PHOSPHATE 10 MG/ML IJ SOLN
INTRAMUSCULAR | Status: DC | PRN
  Administered 2020-01-08: 5 mg via INTRAVENOUS

## 2020-01-08 MED ORDER — FENTANYL CITRATE (PF) 100 MCG/2ML IJ SOLN
INTRAMUSCULAR | Status: DC | PRN
  Administered 2020-01-08: 50 ug via INTRAVENOUS
  Administered 2020-01-08: 25 ug via INTRAVENOUS

## 2020-01-08 MED ORDER — HYDROCODONE-ACETAMINOPHEN 7.5-325 MG/15ML PO SOLN: 15.0000 mL | Freq: Once | ORAL | Status: DC | PRN

## 2020-01-08 MED ORDER — PROPOFOL 500 MG/50ML IV EMUL
INTRAVENOUS | Status: DC | PRN
Start: 2020-01-08 — End: 2020-01-08
  Administered 2020-01-08: 150 ug/kg/min via INTRAVENOUS

## 2020-01-08 MED ORDER — LIDOCAINE HCL (CARDIAC) PF 100 MG/5ML IV SOSY
PREFILLED_SYRINGE | INTRAVENOUS | Status: DC | PRN
  Administered 2020-01-08: 60 mg via INTRAVENOUS

## 2020-01-08 MED ORDER — MIDAZOLAM HCL 5 MG/5ML IJ SOLN
INTRAMUSCULAR | Status: DC | PRN
  Administered 2020-01-08: 2 mg via INTRAVENOUS

## 2020-01-08 MED ORDER — FENTANYL CITRATE (PF) 100 MCG/2ML IJ SOLN: 25.0000 ug | INTRAMUSCULAR | Status: DC | PRN

## 2020-01-08 MED ORDER — HYDROCODONE-ACETAMINOPHEN 5-325 MG PO TABS
1.0000 | ORAL_TABLET | Freq: Four times a day (QID) | ORAL | 0 refills | Status: DC | PRN
Start: 2020-01-08 — End: 2020-03-19

## 2020-01-08 SURGICAL SUPPLY — 34 items
ADH SKN CLS APL DERMABOND .7 (GAUZE/BANDAGES/DRESSINGS) ×1
APL PRP STRL LF DISP 70% ISPRP (MISCELLANEOUS) ×1
BLADE SURG 15 STRL LF DISP TIS (BLADE) ×1 IMPLANT
BLADE SURG 15 STRL SS (BLADE) ×2
BNDG ELASTIC 4X5.8 VLCR STR LF (GAUZE/BANDAGES/DRESSINGS) ×1 IMPLANT
CHLORAPREP W/TINT 26 (MISCELLANEOUS) ×2 IMPLANT
COVER BACK TABLE 60X90IN (DRAPES) ×2 IMPLANT
COVER MAYO STAND STRL (DRAPES) ×2 IMPLANT
DERMABOND ADVANCED (GAUZE/BANDAGES/DRESSINGS) ×1
DERMABOND ADVANCED .7 DNX12 (GAUZE/BANDAGES/DRESSINGS) IMPLANT
DRAPE LAPAROTOMY 100X72 PEDS (DRAPES) ×2 IMPLANT
DRAPE UTILITY XL STRL (DRAPES) ×2 IMPLANT
ELECT COATED BLADE 2.86 ST (ELECTRODE) ×2 IMPLANT
ELECT REM PT RETURN 9FT ADLT (ELECTROSURGICAL) ×2
ELECTRODE REM PT RTRN 9FT ADLT (ELECTROSURGICAL) ×1 IMPLANT
GLOVE BIO SURGEON STRL SZ 6.5 (GLOVE) ×1 IMPLANT
GLOVE BIOGEL PI IND STRL 6.5 (GLOVE) IMPLANT
GLOVE BIOGEL PI IND STRL 8 (GLOVE) ×1 IMPLANT
GLOVE BIOGEL PI INDICATOR 6.5 (GLOVE) ×1
GLOVE BIOGEL PI INDICATOR 8 (GLOVE) ×1
GLOVE ECLIPSE 6.5 STRL STRAW (GLOVE) ×1 IMPLANT
GLOVE ECLIPSE 8.0 STRL XLNG CF (GLOVE) ×2 IMPLANT
GOWN STRL REUS W/ TWL LRG LVL3 (GOWN DISPOSABLE) ×2 IMPLANT
GOWN STRL REUS W/TWL LRG LVL3 (GOWN DISPOSABLE) ×4
NDL HYPO 25X1 1.5 SAFETY (NEEDLE) ×1 IMPLANT
NEEDLE HYPO 25X1 1.5 SAFETY (NEEDLE) ×2 IMPLANT
PACK BASIN DAY SURGERY FS (CUSTOM PROCEDURE TRAY) ×2 IMPLANT
PENCIL SMOKE EVACUATOR (MISCELLANEOUS) ×2 IMPLANT
SLEEVE SCD COMPRESS KNEE MED (MISCELLANEOUS) ×2 IMPLANT
SPONGE LAP 4X18 RFD (DISPOSABLE) ×1 IMPLANT
SUT MON AB 4-0 PC3 18 (SUTURE) ×2 IMPLANT
SUT VICRYL 3-0 CR8 SH (SUTURE) ×2 IMPLANT
SYR CONTROL 10ML LL (SYRINGE) ×2 IMPLANT
TOWEL GREEN STERILE FF (TOWEL DISPOSABLE) ×4 IMPLANT

## 2020-01-08 NOTE — Discharge Instructions (Signed)
GENERAL SURGERY: POST OP INSTRUCTIONS  ######################################################################  EAT Gradually transition to a high fiber diet with a fiber supplement over the next few weeks after discharge.  Start with a pureed / full liquid diet (see below)  WALK Walk an hour a day.  Control your pain to do that.    CONTROL PAIN Control pain so that you can walk, sleep, tolerate sneezing/coughing, go up/down stairs.  HAVE A BOWEL MOVEMENT DAILY Keep your bowels regular to avoid problems.  OK to try a laxative to override constipation.  OK to use an antidairrheal to slow down diarrhea.  Call if not better after 2 tries  CALL IF YOU HAVE PROBLEMS/CONCERNS Call if you are still struggling despite following these instructions. Call if you have concerns not answered by these instructions  ######################################################################    1. DIET: Follow a light bland diet & liquids the first 24 hours after arrival home, such as soup, liquids, starches, etc.  Be sure to drink plenty of fluids.  Quickly advance to a usual solid diet within a few days.  Avoid fast food or heavy meals as your are more likely to get nauseated or have irregular bowels.  A low-fat, high-fiber diet for the rest of your life is ideal.   2. Take your usually prescribed home medications unless otherwise directed. 3. PAIN CONTROL: a. Pain is best controlled by a usual combination of three different methods TOGETHER: i. Ice/Heat ii. Over the counter pain medication iii. Prescription pain medication b. Most patients will experience some swelling and bruising around the incisions.  Ice packs or heating pads (30-60 minutes up to 6 times a day) will help. Use ice for the first few days to help decrease swelling and bruising, then switch to heat to help relax tight/sore spots and speed recovery.  Some people prefer to use ice alone, heat alone, alternating between ice & heat.   Experiment to what works for you.  Swelling and bruising can take several weeks to resolve.   c. It is helpful to take an over-the-counter pain medication regularly for the first few weeks.  Choose one of the following that works best for you: i. Naproxen (Aleve, etc)  Two 220mg tabs twice a day ii. Ibuprofen (Advil, etc) Three 200mg tabs four times a day (every meal & bedtime) iii. Acetaminophen (Tylenol, etc) 500-650mg four times a day (every meal & bedtime) d. A  prescription for pain medication (such as oxycodone, hydrocodone, etc) should be given to you upon discharge.  Take your pain medication as prescribed.  i. If you are having problems/concerns with the prescription medicine (does not control pain, nausea, vomiting, rash, itching, etc), please call us [phone] to see if we need to switch you to a different pain medicine that will work better for you and/or control your side effect better. ii. If you need a refill on your pain medication, please contact your pharmacy.  They will contact our office to request authorization. Prescriptions will not be filled after 5 pm or on week-ends. 4. Avoid getting constipated.  Between the surgery and the pain medications, it is common to experience some constipation.  Increasing fluid intake and taking a fiber supplement (such as Metamucil, Citrucel, FiberCon, MiraLax, etc) 1-2 times a day regularly will usually help prevent this problem from occurring.  A mild laxative (prune juice, Milk of Magnesia, MiraLax, etc) should be taken according to package directions if there are no bowel movements after 48 hours.   5. Wash /   shower every day.  You may shower over the dressings as they are waterproof.  Continue to shower over incision(s) after the dressing is off. 6. Remove your waterproof bandages 5 days after surgery.  You may leave the incision open to air.  You may have skin tapes (Steri Strips) covering the incision(s).  Leave them on until one week, then  remove.  You may replace a dressing/Band-Aid to cover the incision for comfort if you wish.      7. ACTIVITIES as tolerated:   a. You may resume regular (light) daily activities beginning the next day--such as daily self-care, walking, climbing stairs--gradually increasing activities as tolerated.  If you can walk 30 minutes without difficulty, it is safe to try more intense activity such as jogging, treadmill, bicycling, low-impact aerobics, swimming, etc. b. Save the most intensive and strenuous activity for last such as sit-ups, heavy lifting, contact sports, etc  Refrain from any heavy lifting or straining until you are off narcotics for pain control.   c. DO NOT PUSH THROUGH PAIN.  Let pain be your guide: If it hurts to do something, don't do it.  Pain is your body warning you to avoid that activity for another week until the pain goes down. d. You may drive when you are no longer taking prescription pain medication, you can comfortably wear a seatbelt, and you can safely maneuver your car and apply brakes. e. You may have sexual intercourse when it is comfortable.  8. FOLLOW UP in our office a. Please call [facility] at [phone] to set up an appointment to see your surgeon in the office for a follow-up appointment approximately 2-3 weeks after your surgery. b. Make sure that you call for this appointment the day you arrive home to insure a convenient appointment time. 9. IF YOU HAVE DISABILITY OR FAMILY LEAVE FORMS, BRING THEM TO THE OFFICE FOR PROCESSING.  DO NOT GIVE THEM TO YOUR DOCTOR.   WHEN TO CALL US [phone]: 1. Poor pain control 2. Reactions / problems with new medications (rash/itching, nausea, etc)  3. Fever over 101.5 F (38.5 C) 4. Worsening swelling or bruising 5. Continued bleeding from incision. 6. Increased pain, redness, or drainage from the incision 7. Difficulty breathing / swallowing   The clinic staff is available to answer your questions during regular  business hours (8:30am-5pm).  Please don't hesitate to call and ask to speak to one of our nurses for clinical concerns.   If you have a medical emergency, go to the nearest emergency room or call 911.  A surgeon from [facility] is always on call at the hospitals   [facility] [address], [address], [place], [place]  [address] ? MAIN: [phone] ? TOLL FREE: [phone] ?  FAX [phone] [email or web address]    Post Anesthesia Home Care Instructions  Activity: Get plenty of rest for the remainder of the day. A responsible individual must stay with you for 24 hours following the procedure.  For the next 24 hours, DO NOT: -Drive a car -Operate machinery -Drink alcoholic beverages -Take any medication unless instructed by your physician -Make any legal decisions or sign important papers.  Meals: Start with liquid foods such as gelatin or soup. Progress to regular foods as tolerated. Avoid greasy, spicy, heavy foods. If nausea and/or vomiting occur, drink only clear liquids until the nausea and/or vomiting subsides. Call your physician if vomiting continues.  Special Instructions/Symptoms: Your throat may feel dry or   the anesthesia or the breathing tube placed in your throat during surgery. If this causes discomfort, gargle with warm salt water. The discomfort should disappear within 24 hours.  If you had a scopolamine patch placed behind your ear for the management of post- operative nausea and/or vomiting:  1. The medication in the patch is effective for 72 hours, after which it should be removed.  Wrap patch in a tissue and discard in the trash. Wash hands thoroughly with soap and water. 2. You may remove the patch earlier than 72 hours if you experience unpleasant side effects which may include dry mouth, dizziness or visual disturbances. 3. Avoid touching the patch. Wash your hands with soap and water after contact  with the patch.       Call your surgeon if you experience:   1.  Fever over 101.0. 2.  Inability to urinate. 3.  Nausea and/or vomiting. 4.  Extreme swelling or bruising at the surgical site. 5.  Continued bleeding from the incision. 6.  Increased pain, redness or drainage from the incision. 7.  Problems related to your pain medication. 8.  Any problems and/or concerns

## 2020-01-08 NOTE — Anesthesia Procedure Notes (Addendum)
Procedure Name: LMA Insertion Date/Time: 01/08/2020 1:01 PM Performed by: Maryella Shivers, CRNA Pre-anesthesia Checklist: Patient identified, Emergency Drugs available, Suction available and Patient being monitored Patient Re-evaluated:Patient Re-evaluated prior to induction Oxygen Delivery Method: Circle system utilized Preoxygenation: Pre-oxygenation with 100% oxygen Induction Type: IV induction Ventilation: Mask ventilation without difficulty LMA: LMA inserted LMA Size: 4.0 Number of attempts: 1 Airway Equipment and Method: Bite block Placement Confirmation: positive ETCO2 Tube secured with: Tape Dental Injury: Teeth and Oropharynx as per pre-operative assessment

## 2020-01-08 NOTE — Transfer of Care (Signed)
Immediate Anesthesia Transfer of Care Note  Patient: [name]  Procedure(s) Performed: EXCISION RIGHT LEG MASS (Right Leg Lower)  Patient Location: PACU  Anesthesia Type:General  Level of Consciousness: awake, alert  and oriented  Airway & Oxygen Therapy: Patient Spontanous Breathing and Patient connected to face mask oxygen  Post-op Assessment: Report given to RN and Post -op Vital signs reviewed and stable  Post vital signs: Reviewed and stable  Last Vitals:  Vitals Value Taken Time  BP 134/85 01/08/20 1351  Temp    Pulse 73 01/08/20 1353  Resp 20 01/08/20 1353  SpO2 100 % 01/08/20 1353  Vitals shown include unvalidated device data.  Last Pain:  Vitals:   01/08/20 1212  TempSrc: Oral  PainSc: 3       Patients Stated Pain Goal: 3 (57/50/51 8335)  Complications: No complications documented.

## 2020-01-08 NOTE — H&P (Signed)
[name] is an 64 y.o. female.   Chief Complaint: right lower leg mass HPI: pt presents for excision of right lower leg mass that has been present for 6 months.  It causes pain no redness or drainage   Past Medical History:  Diagnosis Date  . Anxiety   . Arthritis    low back  . Cervical pain (neck)   . Complication of anesthesia    DIFFICULTY WAKING UP  . Coronary artery disease   . Depression   . Diverticulitis   . GERD (gastroesophageal reflux disease)   . Hyperlipidemia   . Insomnia   . Mass of right lower leg   . Morbid obesity ([place])   . OSA on CPAP    uses CPAP nightly  . PONV (postoperative nausea and vomiting)   . Post-polio syndrome    With left leg paralysis  . S/P TAVR (transcatheter aortic valve replacement)   . Severe aortic stenosis   . Sigmoid diverticulosis     Past Surgical History:  Procedure Laterality Date  . ANKLE FUSION     left  . APPENDECTOMY    . CHOLECYSTECTOMY    . EYE SURGERY Bilateral 2020   cataracts removed August and September 2020  . JOINT REPLACEMENT Right 2003   three replacements; 2003 is last one  . KNEE ARTHROPLASTY    . LEG SURGERY     muscle surgery related to polio  . RECTAL PROLAPSE REPAIR  2020   pessary placed  . RIGHT/LEFT HEART CATH AND CORONARY ANGIOGRAPHY N/A 04/02/2019   Procedure: RIGHT/LEFT HEART CATH AND CORONARY ANGIOGRAPHY;  Surgeon: Nelva Bush, MD;  Location: [facility];  Service: Cardiovascular;  Laterality: N/A;  . TEE WITHOUT CARDIOVERSION N/A 04/17/2019   Procedure: TRANSESOPHAGEAL ECHOCARDIOGRAM (TEE);  Surgeon: Sherren Mocha, MD;  Location: [facility];  Service: Open Heart Surgery;  Laterality: N/A;  . TOTAL HIP ARTHROPLASTY     right x 3  . TRANSCATHETER AORTIC VALVE REPLACEMENT, TRANSFEMORAL N/A 04/17/2019   Procedure: TRANSCATHETER AORTIC VALVE REPLACEMENT, TRANSFEMORAL;  Surgeon: Sherren Mocha, MD;  Location: [facility];  Service: Open Heart Surgery;   Laterality: N/A;  . UMBILICAL HERNIA REPAIR      Family History  Problem Relation Age of Onset  . Cancer Mother   . Heart disease Father   . CAD Other        FAM Hx OF CAD  . Diabetes Other        FAM Hx of DM  . Hypertension Other    Social History:  reports that she has never smoked. She has never used smokeless tobacco. She reports that she does not drink alcohol and does not use drugs.  Allergies:  Allergies  Allergen Reactions  . Amoxicillin Anaphylaxis and Swelling    Did it involve swelling of the face/tongue/throat, SOB, or low BP? Yes Did it involve sudden or severe rash/hives, skin peeling, or any reaction on the inside of your mouth or nose? No Did you need to seek medical attention at a hospital or doctor's office? Yes When did it last happen?yrs ago If all above answers are "NO", may proceed with cephalosporin use.   . Flagyl [Metronidazole] Anaphylaxis  . Plaquenil [Hydroxychloroquine Sulfate] Other (See Comments)    Blurred vision, joint pain  . Codeine Nausea Only  . Doxycycline Nausea And Vomiting  . Other Nausea Only    Tylenol with Codeine #3  . Percocet [Oxycodone-Acetaminophen] Nausea Only  .  Sulfa Antibiotics Nausea Only    Medications Prior to Admission  Medication Sig Dispense Refill  . ALPRAZolam (XANAX) 0.5 MG tablet Take 0.5 mg by mouth 2 (two) times daily as needed for sleep.     Marland Kitchen aspirin 81 MG chewable tablet Chew 1 tablet (81 mg total) by mouth daily.    . Cholecalciferol (D3 HIGH POTENCY) 1000 UNITS capsule Take 2,000 Units by mouth daily.     Marland Kitchen docusate sodium (COLACE) 100 MG capsule Take 100 mg by mouth daily.    . fluticasone (FLONASE) 50 MCG/ACT nasal spray Place 1 spray into both nostrils as needed for allergies or rhinitis.    Marland Kitchen gabapentin (NEURONTIN) 300 MG capsule Take 300 mg by mouth daily.    . pantoprazole (PROTONIX) 40 MG tablet Take 40 mg by mouth daily.    . traZODone (DESYREL) 100 MG tablet Take 100 mg by mouth at  bedtime.     Marland Kitchen venlafaxine XR (EFFEXOR-XR) 75 MG 24 hr capsule Take 75 mg by mouth daily with breakfast.    . vitamin B-12 (CYANOCOBALAMIN) 250 MCG tablet Take 250 mcg by mouth daily.    . cetirizine (ZYRTEC) 10 MG tablet Take 10 mg by mouth as needed for allergies.    1. Ovoid peripherally enhancing lesion within the anteromedial subcutaneous soft tissues of the right lower leg at the level of the proximal tibial diaphysis measuring up to 2.0 cm. Internal density of the lesion measures near the upper limits of fluid density. There is overlying mild skin thickening and induration within the adjacent subcutaneous tissues. Imaging features are most suggestive of cellulitis with a subcutaneous abscess. A solid mass with internal necrosis would be difficult to exclude by CT. An ultrasound is suggested to further differentiate whether this is a solid lesion or a fluid collection. 2. Complete fatty atrophy of the posterior compartment calf musculature likely sequela of polio given patient history. 3. Mild medial compartment joint space narrowing of the right knee.  No results found for this or any previous visit (from the past 48 hour(s)). No results found.  Review of Systems  All other systems reviewed and are negative.   Blood pressure (!) 132/80, pulse 64, temperature 98.4 F (36.9 C), temperature source Oral, resp. rate 16, height 5\' 8"  (1.727 m), weight (!) 118.8 kg, SpO2 99 %. Physical Exam Constitutional:      Appearance: Normal appearance.  HENT:     Head: Normocephalic.     Nose: Nose normal.     Mouth/Throat:     Mouth: Mucous membranes are moist.  Eyes:     Extraocular Movements: Extraocular movements intact.     Pupils: Pupils are equal, round, and reactive to light.  Cardiovascular:     Rate and Rhythm: Normal rate and regular rhythm.  Abdominal:     General: Abdomen is flat.     Palpations: Abdomen is soft.  Musculoskeletal:     Cervical back: Normal range of  motion and neck supple.       Legs:  Skin:    General: Skin is warm.  Neurological:     General: No focal deficit present.     Mental Status: She is alert.      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  Alternative therapies have been discussed with the patient.  Operative risks include bleeding,  Infection,  Organ injury,  Nerve injury,  Blood vessel injury,  DVT,  Pulmonary embolism,  Death,  And possible reoperation.  Medical management risks include worsening of present situation.  The success of the procedure is 50 -90 % at treating patients symptoms.  The patient understands and agrees to proceed.  Turner Daniels, MD 01/08/2020, 12:26 PM

## 2020-01-08 NOTE — Interval H&P Note (Signed)
History and Physical Interval Note:  01/08/2020 12:31 PM  [name]  has presented today for surgery, with the diagnosis of RIGHT LEG MASS.  The various methods of treatment have been discussed with the patient and family. After consideration of risks, benefits and other options for treatment, the patient has consented to  Procedure(s): EXCISION RIGHT LEG MASS (Right) as a surgical intervention.  The patient's history has been reviewed, patient examined, no change in status, stable for surgery.  I have reviewed the patient's chart and labs.  Questions were answered to the patient's satisfaction.     Turner Daniels MD

## 2020-01-08 NOTE — Op Note (Signed)
Preoperative diagnosis: Right lower leg soft tissue mass 4 cm x 4 cm subcutaneous  Postoperative diagnosis: Same  Procedure: Excision of right lower leg soft tissue mass subcutaneous 4 cm x 4 cm  Surgeon: Erroll Luna, MD  Anesthesia: LMA with 0.25% Marcaine plain  EBL: Minimal  Drains: None  Specimen: Fatty soft tissue mass to pathology  Indications for procedure: The patient presents for excision of a painful right lower leg mass.  This is located below her knee and the anteromedial portion of the proximal lower leg.  Is subcutaneous.  This was marked with the assistance the patient and the CT scan showed this to be a fatty mass.  Does appear to be consistent with a lipoma.  She wants excision due to discomfort.The procedure has been discussed with the patient.  Alternative therapies have been discussed with the patient.  Operative risks include bleeding,  Infection,  Organ injury,  Nerve injury,  Blood vessel injury,  DVT,  Pulmonary embolism,  Death,  And possible reoperation.  Medical management risks include worsening of present situation.  The success of the procedure is 50 -90 % at treating patients symptoms.  The patient understands and agrees to proceed.   Description of procedure: The patient was met in the holding area.  The areas marked with the assistance of the patient and marked.  Questions were answered.  She was taken back to the operative room.  She is placed supine upon the OR table.  After induction of general esthesia, the right lower leg was prepped and draped in a sterile fashion and timeout was performed.  She received appropriate preoperative antibiotics.  Local anesthetic was infiltrated over the 4 cm subcutaneous mass located in the medial proximal right thigh.  Incision was made.  A fatty subcutaneous mass was encountered in the subcutaneous tissue.  And all fatty tissue was excised.  This measured about 4 cm in maximal diameter.  All neighboring neurovascular  structures were preserved.  Hemostasis was achieved.  Wound closed with 3-0 Vicryl and 4-0 Monocryl.  Dermabond applied.  Ace wrap placed.  All counts were correct.  The patient was awoke extubated taken recovery in satisfactory condition.

## 2020-01-08 NOTE — Anesthesia Preprocedure Evaluation (Signed)
Anesthesia Evaluation  Patient identified by MRN, date of birth, ID band Patient awake    Reviewed: Allergy & Precautions, NPO status , Patient's Chart, lab work & pertinent test results  History of Anesthesia Complications (+) PONV and history of anesthetic complications  Airway Mallampati: II  TM Distance: >3 FB Neck ROM: Full    Dental  (+) Dental Advisory Given   Pulmonary neg shortness of breath, sleep apnea and Continuous Positive Airway Pressure Ventilation , neg COPD, neg recent URI,    breath sounds clear to auscultation       Cardiovascular + CAD and +CHF   Rhythm:Regular     Neuro/Psych PSYCHIATRIC DISORDERS Anxiety Depression Post polio syndrome, wheelchair use with left leg weakness    GI/Hepatic Neg liver ROS, GERD  Medicated and Controlled,  Endo/Other  Morbid obesity  Renal/GU negative Renal ROS     Musculoskeletal  (+) Arthritis ,   Abdominal   Peds  Hematology negative hematology ROS (+)   Anesthesia Other Findings   Reproductive/Obstetrics                             Anesthesia Physical Anesthesia Plan  ASA: III  Anesthesia Plan: General   Post-op Pain Management:    Induction: Intravenous  PONV Risk Score and Plan: 4 or greater and Propofol infusion, Dexamethasone, Ondansetron and TIVA  Airway Management Planned: LMA  Additional Equipment: None  Intra-op Plan:   Post-operative Plan: Extubation in OR  Informed Consent: I have reviewed the patients History and Physical, chart, labs and discussed the procedure including the risks, benefits and alternatives for the proposed anesthesia with the patient or authorized representative who has indicated his/her understanding and acceptance.     Dental advisory given  Plan Discussed with: CRNA and Surgeon  Anesthesia Plan Comments:         Anesthesia Quick Evaluation

## 2020-01-09 ENCOUNTER — Ambulatory Visit: Payer: Medicare Other | Admitting: Cardiology

## 2020-01-09 ENCOUNTER — Encounter (HOSPITAL_BASED_OUTPATIENT_CLINIC_OR_DEPARTMENT_OTHER): Payer: Self-pay | Admitting: Surgery

## 2020-01-10 LAB — SURGICAL PATHOLOGY

## 2020-01-10 NOTE — Anesthesia Postprocedure Evaluation (Signed)
Anesthesia Post Note  Patient: [name]  Procedure(s) Performed: EXCISION RIGHT LEG MASS (Right Leg Lower)     Patient location during evaluation: PACU Anesthesia Type: General Level of consciousness: awake and alert Pain management: pain level controlled Vital Signs Assessment: post-procedure vital signs reviewed and stable Respiratory status: spontaneous breathing, nonlabored ventilation, respiratory function stable and patient connected to nasal cannula oxygen Cardiovascular status: blood pressure returned to baseline and stable Postop Assessment: no apparent nausea or vomiting Anesthetic complications: no   No complications documented.  Last Vitals:  Vitals:   01/08/20 1423 01/08/20 1456  BP:  121/82  Pulse: 62 71  Resp: (!) 27 18  Temp:  36.6 C  SpO2: 99% 98%    Last Pain:  Vitals:   01/09/20 0958  TempSrc:   PainSc: 3                  [name]

## 2020-01-29 ENCOUNTER — Ambulatory Visit (HOSPITAL_COMMUNITY)
Admission: RE | Admit: 2020-01-29 | Discharge: 2020-01-29 | Disposition: A | Discharge status: Home / Self Care | Payer: Medicare Other | Source: Ambulatory Visit | Attending: Surgery | Admitting: Surgery

## 2020-01-29 ENCOUNTER — Other Ambulatory Visit: Payer: Self-pay

## 2020-01-29 ENCOUNTER — Other Ambulatory Visit (HOSPITAL_COMMUNITY): Payer: Self-pay | Admitting: Surgery

## 2020-01-29 DIAGNOSIS — R609 Edema, unspecified: Secondary | ICD-10-CM

## 2020-01-29 NOTE — Progress Notes (Signed)
Right lower extremity venous duplex has been completed. Preliminary results can be found in CV Proc through chart review.  Results were given to Abigail Butts at Dr. Josetta Huddle office.  01/29/20 1:07 PM [name] RVT

## 2020-02-21 ENCOUNTER — Other Ambulatory Visit: Payer: Self-pay | Admitting: Physician Assistant

## 2020-02-21 DIAGNOSIS — Z952 Presence of prosthetic heart valve: Secondary | ICD-10-CM

## 2020-02-28 ENCOUNTER — Encounter (HOSPITAL_BASED_OUTPATIENT_CLINIC_OR_DEPARTMENT_OTHER): Payer: Medicare Other | Admitting: Internal Medicine

## 2020-03-18 NOTE — Progress Notes (Signed)
[name]                                       Cardiology Office Note    Date:  03/20/2020   ID:  [name], DOB 21-Sep-1955, MRN [id number]  PCP:  [name], MD  Cardiologist: Minus Breeding, MD / Dr. Burt Knack & Dr. Cyndia Bent (TAVR)  CC: 1 year s/p TAVR  History of Present Illness:  [name] is a 64 y.o. 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   She was admitted from 10/16-10/20/20 for chest and back pain. In the ER, EKG showed sinus withlateral and anterolateral ST-T wave abnormalities, more prominent anterolaterally compared to last tracings. High-sensitivity troponin 41---> 46--> 50--> 51 --> 49.CT angiochest/abdomen/pelvis showed no acute findings.Incidentally, she wasnoted to havethoracoabdominal atherosclerosis and valvular calcifications. Echo showed critical AS with EF 55%, mild LVH, severe AS with mean gradient 71.8 mm Hg, peak gradient 105 mm Hg, DVI .014, mod AI. [facility] 04/02/19 showed normal cors, severe aortic stenosis (mean gradient 65 mmHg, calculated valve area 0.5 cm^2), moderately to severely elevated left ventricular filling pressure, mildly elevated right and pulmonary artery pressuresand moderately reduced cardiac output.She was evaluated by the multidisciplinary valve team and underwent TAVR work up with pre TAVR CT scans during her admission.   She was brought back on 04/17/19 and underwent successfulBAV andTAVR with a10mm Edwards Sapien 3 UltraTHV via the TF approach. Post op echo showed EF 60-65%, normally functioning TAVR with mean gradient of 16.7 mm Hg and no PVL. She was discharged home on POD 1 (04/18/19) on aspirin and plavix.   She then returned to [facility] ED on  04/18/19 with severe back and chest pain. She was admitted overnight for observation. Echo showed LVEF of 55% with moderate LVH and no evidence of pericardial effusion. Mean TAVR gradient mean gradient measure 16.0 mmHg. Chest CTA negative for PE/ dissection. Chest pain and back pain resolved. Exact etiology of pain unclear. Possiblly musculoskeletal. 1 month echo s/p TAVR showed EF 55%, normally functioning with a mean gradient of 11.5 mm Hg and no PVL.  Today she presents to clinic for follow up. 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 Also has had some leg pain and numbness in right leg (only working left with post polio syndrome). Seeing Dr. Donzetta Matters and gettting ABIs to for follow up next month.    Past Medical History:  Diagnosis Date  . Anxiety   . Arthritis    low back  . Cervical pain (neck)   . Complication of anesthesia    DIFFICULTY WAKING UP  . Coronary artery disease   . Depression   . Diverticulitis   . GERD (gastroesophageal reflux disease)   . Hyperlipidemia   . Insomnia   . Mass of right lower leg   . Morbid obesity ([place])   . OSA on CPAP    uses CPAP nightly  . PONV (postoperative nausea and vomiting)   . Post-polio syndrome    With left leg paralysis  . S/P TAVR (transcatheter aortic valve replacement)   . Severe aortic stenosis   . Sigmoid diverticulosis     Past Surgical History:  Procedure  Laterality Date  . ANKLE FUSION     left  . APPENDECTOMY    . CHOLECYSTECTOMY    . EYE SURGERY Bilateral 2020   cataracts removed August and September 2020  . JOINT REPLACEMENT Right 2003   three replacements; 2003 is last one  . KNEE ARTHROPLASTY    . LEG SURGERY     muscle surgery related to polio  . MASS EXCISION Right 01/08/2020   Procedure: EXCISION RIGHT LEG MASS;  Surgeon: Erroll Luna, MD;  Location: [place];  Service: General;  Laterality: Right;  . RECTAL PROLAPSE REPAIR  2020   pessary placed  . RIGHT/LEFT  HEART CATH AND CORONARY ANGIOGRAPHY N/A 04/02/2019   Procedure: RIGHT/LEFT HEART CATH AND CORONARY ANGIOGRAPHY;  Surgeon: Nelva Bush, MD;  Location: [place] [facility];  Service: Cardiovascular;  Laterality: N/A;  . TEE WITHOUT CARDIOVERSION N/A 04/17/2019   Procedure: TRANSESOPHAGEAL ECHOCARDIOGRAM (TEE);  Surgeon: Sherren Mocha, MD;  Location: [place] [facility];  Service: Open Heart Surgery;  Laterality: N/A;  . TOTAL HIP ARTHROPLASTY     right x 3  . TRANSCATHETER AORTIC VALVE REPLACEMENT, TRANSFEMORAL N/A 04/17/2019   Procedure: TRANSCATHETER AORTIC VALVE REPLACEMENT, TRANSFEMORAL;  Surgeon: Sherren Mocha, MD;  Location: [place] [facility];  Service: Open Heart Surgery;  Laterality: N/A;  . UMBILICAL HERNIA REPAIR      Current Medications: Outpatient Medications Prior to Visit  Medication Sig Dispense Refill  . ALPRAZolam (XANAX) 0.5 MG tablet Take 0.5 mg by mouth 2 (two) times daily as needed for sleep.     Marland Kitchen aspirin 81 MG chewable tablet Chew 1 tablet (81 mg total) by mouth daily.    . cetirizine (ZYRTEC) 10 MG tablet Take 10 mg by mouth as needed for allergies.    . Cholecalciferol (D3 HIGH POTENCY) 1000 UNITS capsule Take 2,000 Units by mouth daily.     Marland Kitchen docusate sodium (COLACE) 100 MG capsule Take 100 mg by mouth daily.    . fluticasone (FLONASE) 50 MCG/ACT nasal spray Place 1 spray into both nostrils as needed for allergies or rhinitis.    Marland Kitchen gabapentin (NEURONTIN) 300 MG capsule Take 300 mg by mouth daily.    . pantoprazole (PROTONIX) 40 MG tablet Take 40 mg by mouth daily.    . traZODone (DESYREL) 100 MG tablet Take 100 mg by mouth at bedtime.     Marland Kitchen venlafaxine XR (EFFEXOR-XR) 75 MG 24 hr capsule Take 75 mg by mouth daily with breakfast.    . vitamin B-12 (CYANOCOBALAMIN) 250 MCG tablet Take 250 mcg by mouth daily.    Marland Kitchen HYDROcodone-acetaminophen (NORCO/VICODIN) 5-325 MG tablet Take 1 tablet by mouth every 6 (six) hours as needed for moderate pain. (Patient not taking:  Reported on 03/19/2020) 15 tablet 0   No facility-administered medications prior to visit.     Allergies:   Amoxicillin, Flagyl [metronidazole], Plaquenil [hydroxychloroquine sulfate], Codeine, Doxycycline, Other, Percocet [oxycodone-acetaminophen], and Sulfa antibiotics   Social History   Socioeconomic History  . Marital status: Single    Spouse name: Not on file  . Number of children: Not on file  . Years of education: Not on file  . Highest education level: Not on file  Occupational History  . Not on file  Tobacco Use  . Smoking status: Never Smoker  . Smokeless tobacco: Never Used  Vaping Use  . Vaping Use: Never used  Substance and Sexual Activity  . Alcohol use: No  . Drug use: Never  .  Sexual activity: Not on file  Other Topics Concern  . Not on file  Social History Narrative   Lives alone.   Social Determinants of Health   Financial Resource Strain:   . Difficulty of Paying Living Expenses: Not on file  Food Insecurity:   . Worried About Charity fundraiser in the Last Year: Not on file  . Ran Out of Food in the Last Year: Not on file  Transportation Needs:   . Lack of Transportation (Medical): Not on file  . Lack of Transportation (Non-Medical): Not on file  Physical Activity:   . Days of Exercise per Week: Not on file  . Minutes of Exercise per Session: Not on file  Stress:   . Feeling of Stress : Not on file  Social Connections:   . Frequency of Communication with Friends and Family: Not on file  . Frequency of Social Gatherings with Friends and Family: Not on file  . Attends Religious Services: Not on file  . Active Member of Clubs or Organizations: Not on file  . Attends Archivist Meetings: Not on file  . Marital Status: Not on file     Family History:  The patient's family history includes CAD in an other family member; Cancer in her mother; Diabetes in an other family member; Heart disease in her father; Hypertension in an other family  member.     ROS:   Please see the history of present illness.    ROS All other systems reviewed and are negative.   PHYSICAL EXAM:   VS:  BP 130/82   Pulse 64   Ht 5\' 8"  (1.727 m)   Wt 262 lb (118.8 kg)   SpO2 98%   BMI 39.84 kg/m    GEN: Well nourished, well developed, in no acute distress, obese. In wheelchair. HEENT: normal Neck: no JVD or masses Cardiac: RRR; 2/6 murmur. No rubs, or gallops,no edema  Respiratory:  clear to auscultation bilaterally, normal work of breathing GI: soft, nontender, nondistended, + BS MS: no deformity or atrophy Skin: warm and dry, no rash Neuro:  Alert and Oriented x 3, Strength and sensation are intact Psych: euthymic mood, full affect    Wt Readings from Last 3 Encounters:  03/19/20 262 lb (118.8 kg)  01/08/20 (!) 261 lb 14.5 oz (118.8 kg)  12/28/19 262 lb (118.8 kg)    Studies/Labs Reviewed:   EKG:  EKG is NOT ordered today.   Recent Labs: 04/13/2019: B Natriuretic Peptide 152.1 01/04/2020: ALT 18; BUN 14; Creatinine, Ser 0.85; Hemoglobin 14.9; Platelets 268; Potassium 4.0; Sodium 139   Lipid Panel    Component Value Date/Time   CHOL 201 (H) 03/31/2019 0607   TRIG 93 03/31/2019 0607   HDL 44 03/31/2019 0607   CHOLHDL 4.6 03/31/2019 0607   VLDL 19 03/31/2019 0607   LDLCALC 138 (H) 03/31/2019 0607    Additional studies/ records that were reviewed today include:  TAVR OPERATIVE NOTE   Date of Procedure:04/17/2019  Preoperative Diagnosis:Severe Aortic Stenosis   Postoperative Diagnosis:Same   Procedure:   Transcatheter Aortic Valve Replacement - PercutaneousRightTransfemoral Approach Edwards Sapien 3 Ultra THV (size 46mm, model # [id number], serial # [id number])  Co-Surgeons:Bryan Alveria Apley, MD and Sherren Mocha, MD   Anesthesiologist:G. Kalman Shan, MD  Anne Hahn, MD  Pre-operative  Echo Findings: ? Severe aortic stenosis ? Normalleft ventricular systolic function  Post-operative Echo Findings: ? Noparavalvular leak ? Normalleft ventricular systolic function  _____________   Echo 04/18/19: IMPRESSION 1.  Left ventricular ejection fraction, by visual estimation, is 60 to 65%. The left ventricle has normal function. There is no left ventricular hypertrophy. 2. Left ventricular diastolic parameters are consistent with Grade II diastolic dysfunction (pseudonormalization). 3. Elevated left ventricular end-diastolic pressure. 4. Global right ventricle has normal systolic function.The right ventricular size is normal. No increase in right ventricular wall thickness. 5. Left atrial size was mildly dilated. 6. Right atrial size was normal. 7. The mitral valve is normal in structure. No evidence of mitral valve regurgitation. No evidence of mitral stenosis. 8. The tricuspid valve is normal in structure. Tricuspid valve regurgitation is not demonstrated. 9. 26 Edwards Sapien bioprosthetic, stented aortic valve (TAVR) valve is present in the aortic position. Perivalvular Aortic valve regurgitation is not visualized. Aortic valve mean gradient measures 16.7 mmHg. Aortic valve peak gradient measures 30.7  mmHg. Dimensionless index 0.54 and peak velocity 277cm/sec. 10. The pulmonic valve was normal in structure. Pulmonic valve regurgitation is not visualized. 11. The inferior vena cava is normal in size with greater than 50% respiratory variability, suggesting right atrial pressure of 3 mmHg. 12. TR signal is inadequate for assessing pulmonary artery systolic pressure.  _____________  Echocardiogram 04/19/2019: Impressions: 1. Left ventricular ejection fraction, by visual estimation, is 55%. The left ventricle has normal function. There is moderately increased left ventricular hypertrophy. 2. Bioprosthetic aortic valve s/p TAVR. The valve was not well-visualized. I  did not see peri-valvular regurgitation. Mean gradient 16 mmHg. 3. Global right ventricle was not well visualized.The right ventricular size is not well visualized. Right vetricular wall thickness was not assessed. 4. Left atrial size was not well visualized. 5. Right atrial size was not well visualized. 6. The interatrial septum was not well visualized. 7. The aortic root was not well visualized. 8. Aortic valve regurgitation is not visualized. 9. The mitral valve was not well visualized. No evidence of mitral valve regurgitation. No evidence of mitral stenosis. 10. The tricuspid valve is not well visualized. 11. The pulmonic valve was not well visualized. 12. Technically very difficult study even with Definity. _____________   Chest CTA 04/19/2019: Impression: 1. No acute intrathoracic pathology. No CT evidence of pulmonary artery embolus. 2. Status post TAVR. No mediastinal or pericardial fluid.  ______________  Echo 06/05/19 IMPRESSIONS  1. Left ventricular ejection fraction, by visual estimation, is 55 to 60%. The left ventricle has normal function. There is moderately increased left ventricular hypertrophy.  2. Left ventricular diastolic parameters are consistent with Grade I diastolic dysfunction (impaired relaxation).  3. The left ventricle has no regional wall motion abnormalities.  4. Global right ventricle has normal systolic function.The right ventricular size is normal. No increase in right ventricular wall thickness.  5. Left atrial size was mildly dilated.  6. Right atrial size was normal.  7. The mitral valve is normal in structure. No evidence of mitral valve regurgitation. No evidence of mitral stenosis.  8. The tricuspid valve is normal in structure. Tricuspid valve regurgitation is not demonstrated.  9. Aortic valve mean gradient measures 11.5 mmHg. 10. Aortic valve regurgitation is not visualized. No evidence of aortic valve sclerosis or stenosis. 11. S/p  TAVR 60mm Edwards Sapien. Transaortic valve velocity 2.44m/s, mean gradient 80mmHg. 12. The pulmonic valve was normal in structure. Pulmonic valve regurgitation is not visualized. 13. The inferior vena cava is normal in size with greater than 50% respiratory variability, suggesting right atrial pressure of 3 mmHg. 14. No significant change from prior study.  Aortic Valve: The aortic valve has  been repaired/replaced. Aortic valve regurgitation is not visualized. The aortic valve is structurally normal, with no evidence of sclerosis or stenosis. Aortic valve mean gradient measures 11.5 mmHg. Aortic valve peak gradient measures 22.4 mmHg. Aortic valve area, by VTI measures 1.50 cm. S/p TAVR 58mm Edwards Sapien. Transaortic valve velocity 2.37m/s, mean gradient 90mmHg.  ________________________  Echo 03/19/20 IMPRESSIONS    1. 26 mm S3. V max 2.0 m/s, MG 9 mmHG, EOA 1.59 cm2, DI 0.46. No PVL.  Normal prosthesis. The aortic valve has been repaired/replaced. Aortic  valve regurgitation is not visualized. There is a 26 mm Sapien prosthetic  (TAVR) valve present in the aortic  position. Procedure Date: 04/17/2019.  2. Left ventricular ejection fraction, by estimation, is 60 to 65%. The  left ventricle has normal function. The left ventricle has no regional  wall motion abnormalities. Left ventricular diastolic parameters are  consistent with Grade I diastolic  dysfunction (impaired relaxation).  3. Right ventricular systolic function is normal. The right ventricular  size is normal. Tricuspid regurgitation signal is inadequate for assessing  PA pressure.  4. The mitral valve is grossly normal. No evidence of mitral valve  regurgitation. No evidence of mitral stenosis.  5. The inferior vena cava is normal in size with greater than 50%  respiratory variability, suggesting right atrial pressure of 3 mmHg.   Comparison(s): No significant change from prior study.    ASSESSMENT & PLAN:    Severe AS s/p TAVR: echo today shows EF 60%, normally functioning with a mean gradient of 9 mm Hg and no PVL. She has NYHA class I symptoms. SBE prophylaxis discussed; will switch clindamycin to azithromycin given high risk of cdiff with clinda. Continue on aspirin alone. Continue regular follow up with Dr. Percival Spanish.   Chest/back pain: resolved.   Chronic diastolic CHF: appears euvolemic off diuretics   OSA: continue CPAP   Medication Adjustments/Labs and Tests Ordered: Current medicines are reviewed at length with the patient today.  Concerns regarding medicines are outlined above.  Medication changes, Labs and Tests ordered today are listed in the Patient Instructions below. Patient Instructions  Medication Instructions:  1) STOP CLINDAMYCIN prior to dental visits. 2) START AZITHROMYCIN 500 mg one hour prior to all dental visits *If you need a refill on your cardiac medications before your next appointment, please call your pharmacy*   Follow-Up: At [facility], you and your health needs are our priority.  As part of our continuing mission to provide you with exceptional heart care, we have created designated Provider Care Teams.  These Care Teams include your primary Cardiologist (physician) and Advanced Practice Providers (APPs -  Physician Assistants and Nurse Practitioners) who all work together to provide you with the care you need, when you need it. Your next appointment:   9 month(s) The format for your next appointment:   In Person Provider:   You may see Minus Breeding, MD or one of the following Advanced Practice Providers on your designated Care Team:    Rosaria Ferries, PA-C  Jory Sims, DNP, ANP      Signed, Angelena Form, PA-C  03/20/2020 8:42 AM    [facility] [address], [place], [place]  [address] Phone: [phone]; Fax: [phone]

## 2020-03-19 ENCOUNTER — Other Ambulatory Visit: Payer: Self-pay

## 2020-03-19 ENCOUNTER — Ambulatory Visit: Payer: Medicare Other | Admitting: Physician Assistant

## 2020-03-19 ENCOUNTER — Ambulatory Visit (HOSPITAL_COMMUNITY): Payer: Medicare Other | Attending: Cardiovascular Disease

## 2020-03-19 ENCOUNTER — Encounter: Payer: Self-pay | Admitting: Physician Assistant

## 2020-03-19 VITALS — BP 130/82 | HR 64 | Ht 68.0 in | Wt 262.0 lb

## 2020-03-19 DIAGNOSIS — I5032 Chronic diastolic (congestive) heart failure: Secondary | ICD-10-CM | POA: Diagnosis not present

## 2020-03-19 DIAGNOSIS — Z952 Presence of prosthetic heart valve: Secondary | ICD-10-CM

## 2020-03-19 DIAGNOSIS — G4733 Obstructive sleep apnea (adult) (pediatric): Secondary | ICD-10-CM

## 2020-03-19 DIAGNOSIS — R079 Chest pain, unspecified: Secondary | ICD-10-CM | POA: Diagnosis not present

## 2020-03-19 DIAGNOSIS — Z9989 Dependence on other enabling machines and devices: Secondary | ICD-10-CM

## 2020-03-19 LAB — ECHOCARDIOGRAM COMPLETE
AR max vel: 1.43 cm2
AV Area VTI: 1.59 cm2
AV Area mean vel: 1.83 cm2
AV Mean grad: 9 mmHg
AV Peak grad: 17.5 mmHg
Ao pk vel: 2.09 m/s
Area-P 1/2: 2.48 cm2
S' Lateral: 2.8 cm

## 2020-03-19 MED ORDER — PERFLUTREN LIPID MICROSPHERE
1.0000 mL | INTRAVENOUS | Status: AC | PRN
  Administered 2020-03-19 (×2): 1 mL via INTRAVENOUS

## 2020-03-19 MED ORDER — AZITHROMYCIN 500 MG PO TABS
ORAL_TABLET | ORAL | 12 refills | Status: DC
Start: 2020-03-19 — End: 2022-02-09

## 2020-03-19 NOTE — Patient Instructions (Addendum)
Medication Instructions:  1) STOP CLINDAMYCIN prior to dental visits. 2) START AZITHROMYCIN 500 mg one hour prior to all dental visits *If you need a refill on your cardiac medications before your next appointment, please call your pharmacy*   Follow-Up: At [facility], you and your health needs are our priority.  As part of our continuing mission to provide you with exceptional heart care, we have created designated Provider Care Teams.  These Care Teams include your primary Cardiologist (physician) and Advanced Practice Providers (APPs -  Physician Assistants and Nurse Practitioners) who all work together to provide you with the care you need, when you need it. Your next appointment:   9 month(s) The format for your next appointment:   In Person Provider:   You may see Minus Breeding, MD or one of the following Advanced Practice Providers on your designated Care Team:    Rosaria Ferries, PA-C  Jory Sims, DNP, ANP

## 2020-03-24 ENCOUNTER — Other Ambulatory Visit (HOSPITAL_COMMUNITY): Payer: Medicare Other

## 2020-04-03 ENCOUNTER — Other Ambulatory Visit: Payer: Self-pay

## 2020-04-03 ENCOUNTER — Ambulatory Visit: Payer: Medicare Other | Admitting: Cardiology

## 2020-04-03 DIAGNOSIS — R609 Edema, unspecified: Secondary | ICD-10-CM

## 2020-04-18 ENCOUNTER — Other Ambulatory Visit: Payer: Self-pay

## 2020-04-18 ENCOUNTER — Encounter: Payer: Self-pay | Admitting: Vascular Surgery

## 2020-04-18 ENCOUNTER — Ambulatory Visit: Payer: Medicare Other | Admitting: Vascular Surgery

## 2020-04-18 ENCOUNTER — Ambulatory Visit (HOSPITAL_COMMUNITY)
Admission: RE | Admit: 2020-04-18 | Discharge: 2020-04-18 | Disposition: A | Discharge status: Home / Self Care | Payer: Medicare Other | Source: Ambulatory Visit | Attending: Vascular Surgery | Admitting: Vascular Surgery

## 2020-04-18 VITALS — BP 149/84 | HR 63 | Temp 98.0°F | Resp 20 | Ht 68.0 in | Wt 262.0 lb

## 2020-04-18 DIAGNOSIS — R609 Edema, unspecified: Secondary | ICD-10-CM | POA: Diagnosis present

## 2020-04-18 DIAGNOSIS — T8189XA Other complications of procedures, not elsewhere classified, initial encounter: Secondary | ICD-10-CM

## 2020-04-18 NOTE — Progress Notes (Signed)
Patient ID: [name], female   DOB: 11/11/1955, 64 y.o.   MRN: [id number]  Reason for Consult: New Patient (Initial Visit)   Referred by [name], MD  Subjective:     HPI:  [name] is a 64 y.o. female history of polio currently does not walk she does transfer with her right lower extremity.  They had a small mass removed from her right lower extremity several months ago.  Initially was packing has been somewhat slow to heal.  States that she does have some burning pain on the medial aspect of her leg and thigh this is not all the time.  Wound has nearly healed.  She is hoping for vascular evaluation to determine if there is any underlying reason for slow healing.  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  Past Medical History:  Diagnosis Date  . Anxiety   . Arthritis    low back  . Cervical pain (neck)   . Complication of anesthesia    DIFFICULTY WAKING UP  . Coronary artery disease   . Depression   . Diverticulitis   . GERD (gastroesophageal reflux disease)   . Hyperlipidemia   . Insomnia   . Mass of right lower leg   . Morbid obesity ([place])   . OSA on CPAP    uses CPAP nightly  . PONV (postoperative nausea and vomiting)   . Post-polio syndrome    With left leg paralysis  . S/P TAVR (transcatheter aortic valve replacement)   . Severe aortic stenosis   . Sigmoid diverticulosis    Family History  Problem Relation Age of Onset  . Cancer Mother   . Heart disease Father   . CAD Other        FAM Hx OF CAD  . Diabetes Other        FAM Hx of DM  . Hypertension Other    Past Surgical History:  Procedure Laterality Date  . ANKLE FUSION     left  . APPENDECTOMY    . CHOLECYSTECTOMY    . EYE SURGERY Bilateral 2020   cataracts removed August and September 2020  . JOINT REPLACEMENT Right 2003   three replacements; 2003 is last one  . KNEE ARTHROPLASTY    . LEG SURGERY     muscle surgery related to polio  . MASS EXCISION Right  01/08/2020   Procedure: EXCISION RIGHT LEG MASS;  Surgeon: Erroll Luna, MD;  Location: [facility];  Service: General;  Laterality: Right;  . RECTAL PROLAPSE REPAIR  2020   pessary placed  . RIGHT/LEFT HEART CATH AND CORONARY ANGIOGRAPHY N/A 04/02/2019   Procedure: RIGHT/LEFT HEART CATH AND CORONARY ANGIOGRAPHY;  Surgeon: Nelva Bush, MD;  Location: [place] [facility];  Service: Cardiovascular;  Laterality: N/A;  . TEE WITHOUT CARDIOVERSION N/A 04/17/2019   Procedure: TRANSESOPHAGEAL ECHOCARDIOGRAM (TEE);  Surgeon: Sherren Mocha, MD;  Location: [place] [facility];  Service: Open Heart Surgery;  Laterality: N/A;  . TOTAL HIP ARTHROPLASTY     right x 3  . TRANSCATHETER AORTIC VALVE REPLACEMENT, TRANSFEMORAL N/A 04/17/2019   Procedure: TRANSCATHETER AORTIC VALVE REPLACEMENT, TRANSFEMORAL;  Surgeon: Sherren Mocha, MD;  Location: [place] [facility];  Service: Open Heart Surgery;  Laterality: N/A;  . UMBILICAL HERNIA REPAIR      Short Social History:  Social History   Tobacco Use  . Smoking status: Never Smoker  . Smokeless tobacco: Never Used  Substance Use Topics  . Alcohol  use: No    Allergies  Allergen Reactions  . Amoxicillin Anaphylaxis and Swelling    Did it involve swelling of the face/tongue/throat, SOB, or low BP? Yes Did it involve sudden or severe rash/hives, skin peeling, or any reaction on the inside of your mouth or nose? No Did you need to seek medical attention at a hospital or doctor's office? Yes When did it last happen?yrs ago If all above answers are "NO", may proceed with cephalosporin use.   . Flagyl [Metronidazole] Anaphylaxis  . Plaquenil [Hydroxychloroquine Sulfate] Other (See Comments)    Blurred vision, joint pain  . Codeine Nausea Only  . Doxycycline Nausea And Vomiting  . Other Nausea Only    Tylenol with Codeine #3  . Percocet [Oxycodone-Acetaminophen] Nausea Only  . Sulfa Antibiotics Nausea Only    Current Outpatient  Medications  Medication Sig Dispense Refill  . ALPRAZolam (XANAX) 0.5 MG tablet Take 0.5 mg by mouth 2 (two) times daily as needed for sleep.     Marland Kitchen aspirin 81 MG chewable tablet Chew 1 tablet (81 mg total) by mouth daily.    Marland Kitchen azithromycin (ZITHROMAX) 500 MG tablet Take 1 tablet (500 mg) one hour prior to all dental visits. 4 tablet 12  . cetirizine (ZYRTEC) 10 MG tablet Take 10 mg by mouth as needed for allergies.    . Cholecalciferol (D3 HIGH POTENCY) 1000 UNITS capsule Take 2,000 Units by mouth daily.     Marland Kitchen docusate sodium (COLACE) 100 MG capsule Take 100 mg by mouth daily.    . fluticasone (FLONASE) 50 MCG/ACT nasal spray Place 1 spray into both nostrils as needed for allergies or rhinitis.    Marland Kitchen gabapentin (NEURONTIN) 300 MG capsule Take 300 mg by mouth daily.    . pantoprazole (PROTONIX) 40 MG tablet Take 40 mg by mouth daily.    . traZODone (DESYREL) 100 MG tablet Take 100 mg by mouth at bedtime.     Marland Kitchen venlafaxine XR (EFFEXOR-XR) 75 MG 24 hr capsule Take 75 mg by mouth daily with breakfast.    . vitamin B-12 (CYANOCOBALAMIN) 250 MCG tablet Take 250 mcg by mouth daily.     No current facility-administered medications for this visit.    Review of Systems  Constitutional:  Constitutional negative. HENT: HENT negative.  Eyes: Eyes negative.  Cardiovascular: Cardiovascular negative.  GI: Gastrointestinal negative.  Skin: Positive for wound.  Neurological: Positive for focal weakness and light-headedness.  Hematologic: Hematologic/lymphatic negative.  Psychiatric: Psychiatric negative.        Objective:  Objective   Vitals:   04/18/20 1134  BP: (!) 149/84  Pulse: 63  Resp: 20  Temp: 98 F (36.7 C)  SpO2: 96%  Weight: 262 lb (118.8 kg)  Height: 5\' 8"  (1.727 m)   Body mass index is 39.84 kg/m.  Physical Exam HENT:     Head: Normocephalic.     Nose:     Comments: Wearing a mask Eyes:     Pupils: Pupils are equal, round, and reactive to light.  Cardiovascular:      Rate and Rhythm: Normal rate.     Pulses:          Dorsalis pedis pulses are 2+ on the right side and 2+ on the left side.  Pulmonary:     Effort: Pulmonary effort is normal.  Abdominal:     General: Abdomen is flat.     Palpations: Abdomen is soft.  Skin:    Capillary Refill: Capillary refill takes less  than 2 seconds.     Comments: Nearly healed right medial leg incision  Neurological:     General: No focal deficit present.     Mental Status: She is alert.  Psychiatric:        Mood and Affect: Mood normal.        Behavior: Behavior normal.        Thought Content: Thought content normal.        Judgment: Judgment normal.     Data: I have independently interpreted her ABIs to be triphasic bilaterally greater than 1.  Toe pressure on the right 120 and left 112     Assessment/Plan:     64 year old female follows up for slow healing wound on the right leg.  Does appear nearly healed at this time.  She has some pain in the right medial leg extending up to the thigh which may be neurogenic in nature hopefully will dissipate when the wound has completely healed and the inflammation has resolved.  No need for any vascular invention pulses are palpable.  She can follow-up on an as-needed basis.     Waynetta Sandy MD [facility]

## 2020-06-26 ENCOUNTER — Telehealth: Payer: Self-pay | Admitting: Gastroenterology

## 2020-06-26 NOTE — Telephone Encounter (Signed)
Hi Dr. Rush Landmark,  We received records for this patient from [facility]. Patient is requesting to transfer her care over to you.   Will be sending all records for review please advise on scheduling.   Thank you

## 2020-07-01 NOTE — Telephone Encounter (Signed)
See paper chart copy for further records review and next steps.  GM

## 2020-07-02 NOTE — Telephone Encounter (Signed)
Called patient to schedule direct colonoscopy unless having symptoms and wants to repeat EGD then needs clinic appointment per Dr. Rush Landmark. Left detailed voicemail

## 2020-07-08 DIAGNOSIS — N8182 Incompetence or weakening of pubocervical tissue: Secondary | ICD-10-CM | POA: Diagnosis not present

## 2020-07-29 ENCOUNTER — Telehealth: Payer: Self-pay

## 2020-07-29 NOTE — Telephone Encounter (Signed)
John:  Pt had dx of severe aortic stenosis but had a stent put in--SPTVAR.  Last Echo (2021) EF is good , but  Not sure of the stenosis with the stent--probably is ok, but need an expert eye to approve her for [facility]   Thank you

## 2020-07-30 NOTE — Telephone Encounter (Signed)
[name],  As noted in her cardiac echo she had an AVR.  She is cleared for anesthetic care at [facility].  Osvaldo Angst

## 2020-08-01 ENCOUNTER — Other Ambulatory Visit (HOSPITAL_COMMUNITY): Payer: Self-pay | Admitting: Emergency Medicine

## 2020-08-01 ENCOUNTER — Encounter (HOSPITAL_BASED_OUTPATIENT_CLINIC_OR_DEPARTMENT_OTHER): Payer: Self-pay | Admitting: *Deleted

## 2020-08-01 ENCOUNTER — Emergency Department (HOSPITAL_BASED_OUTPATIENT_CLINIC_OR_DEPARTMENT_OTHER)
Admission: EM | Admit: 2020-08-01 | Discharge: 2020-08-01 | Disposition: A | Discharge status: Home / Self Care | Payer: Medicare Other | Attending: Emergency Medicine | Admitting: Emergency Medicine

## 2020-08-01 ENCOUNTER — Emergency Department (HOSPITAL_BASED_OUTPATIENT_CLINIC_OR_DEPARTMENT_OTHER): Payer: Medicare Other

## 2020-08-01 ENCOUNTER — Other Ambulatory Visit: Payer: Self-pay

## 2020-08-01 DIAGNOSIS — Y92002 Bathroom of unspecified non-institutional (private) residence single-family (private) house as the place of occurrence of the external cause: Secondary | ICD-10-CM | POA: Insufficient documentation

## 2020-08-01 DIAGNOSIS — K5750 Diverticulosis of both small and large intestine without perforation or abscess without bleeding: Secondary | ICD-10-CM | POA: Diagnosis not present

## 2020-08-01 DIAGNOSIS — R102 Pelvic and perineal pain: Secondary | ICD-10-CM | POA: Diagnosis not present

## 2020-08-01 DIAGNOSIS — Z7982 Long term (current) use of aspirin: Secondary | ICD-10-CM | POA: Insufficient documentation

## 2020-08-01 DIAGNOSIS — I5032 Chronic diastolic (congestive) heart failure: Secondary | ICD-10-CM | POA: Insufficient documentation

## 2020-08-01 DIAGNOSIS — I2510 Atherosclerotic heart disease of native coronary artery without angina pectoris: Secondary | ICD-10-CM | POA: Insufficient documentation

## 2020-08-01 DIAGNOSIS — M4316 Spondylolisthesis, lumbar region: Secondary | ICD-10-CM | POA: Diagnosis not present

## 2020-08-01 DIAGNOSIS — M6281 Muscle weakness (generalized): Secondary | ICD-10-CM | POA: Insufficient documentation

## 2020-08-01 DIAGNOSIS — Z96641 Presence of right artificial hip joint: Secondary | ICD-10-CM | POA: Diagnosis not present

## 2020-08-01 DIAGNOSIS — M549 Dorsalgia, unspecified: Secondary | ICD-10-CM | POA: Diagnosis present

## 2020-08-01 DIAGNOSIS — S3992XA Unspecified injury of lower back, initial encounter: Secondary | ICD-10-CM | POA: Diagnosis not present

## 2020-08-01 DIAGNOSIS — M4186 Other forms of scoliosis, lumbar region: Secondary | ICD-10-CM | POA: Diagnosis not present

## 2020-08-01 DIAGNOSIS — W1811XA Fall from or off toilet without subsequent striking against object, initial encounter: Secondary | ICD-10-CM | POA: Diagnosis not present

## 2020-08-01 DIAGNOSIS — M545 Low back pain, unspecified: Secondary | ICD-10-CM | POA: Diagnosis not present

## 2020-08-01 DIAGNOSIS — M4696 Unspecified inflammatory spondylopathy, lumbar region: Secondary | ICD-10-CM | POA: Diagnosis not present

## 2020-08-01 DIAGNOSIS — Z9049 Acquired absence of other specified parts of digestive tract: Secondary | ICD-10-CM | POA: Diagnosis not present

## 2020-08-01 MED ORDER — HYDROCODONE-ACETAMINOPHEN 5-325 MG PO TABS
1.0000 | ORAL_TABLET | Freq: Once | ORAL | Status: AC
  Administered 2020-08-01: 1 via ORAL
  Filled 2020-08-01: qty 1

## 2020-08-01 MED ORDER — HYDROCODONE-ACETAMINOPHEN 5-325 MG PO TABS: 1.0000 | ORAL_TABLET | ORAL | 0 refills | Status: DC | PRN

## 2020-08-01 MED ORDER — PREDNISONE 20 MG PO TABS
ORAL_TABLET | ORAL | 0 refills | Status: DC
Start: 2020-08-01 — End: 2020-08-05

## 2020-08-01 MED ORDER — ONDANSETRON 4 MG PO TBDP
4.0000 mg | ORAL_TABLET | Freq: Once | ORAL | Status: AC
  Administered 2020-08-01: 4 mg via ORAL
  Filled 2020-08-01: qty 1

## 2020-08-01 MED FILL — predniSONE 20 MG TABS: 20 | 5 days supply | Qty: 11 | Fill #0

## 2020-08-01 MED FILL — HYDROCODON-APAP 5-325: 5-325 | 2 days supply | Qty: 10 | Fill #0

## 2020-08-01 NOTE — ED Notes (Signed)
Patient given a warm blanket. 

## 2020-08-01 NOTE — ED Provider Notes (Signed)
[facility] [facility] EMERGENCY DEPARTMENT Provider Note   CSN: [id number] Arrival date & time: 08/01/20  1340     History CC: Back , pelvic pain  [name] is a 65 y.o. female.  HPI   Patient has history of polio.  She has paralysis of her left lower extremity and significant weakness of her right lower extremity.  She uses a motorized wheelchair to get around.  Patient states she was transferring to a low sitting toilet at home when she fell hard onto the toilet about a week ago.  Patient has had increasing pain in her back over the last several days.  Patient states the pain is very sharp and severe and it is hard for her to transfer because of the pain.  She called the orthopedic doctor and has an appointment next week but because of the increasing pain she did not feel like she could wait.  Past Medical History:  Diagnosis Date  . Anxiety   . Arthritis    low back  . Cervical pain (neck)   . Complication of anesthesia    DIFFICULTY WAKING UP  . Coronary artery disease   . Depression   . Diverticulitis   . GERD (gastroesophageal reflux disease)   . Hyperlipidemia   . Insomnia   . Mass of right lower leg   . Morbid obesity ([place])   . OSA on CPAP    uses CPAP nightly  . PONV (postoperative nausea and vomiting)   . Post-polio syndrome    With left leg paralysis  . S/P TAVR (transcatheter aortic valve replacement)   . Severe aortic stenosis   . Sigmoid diverticulosis     Patient Active Problem List   Diagnosis Date Noted  . Educated about COVID-19 virus infection 10/03/2019  . Back pain 04/20/2019  . Chest pain 04/19/2019  . Chronic diastolic CHF (congestive heart failure) ([place]) 04/17/2019  . S/P TAVR (transcatheter aortic valve replacement) 04/17/2019  . Post-polio syndrome   . Morbid obesity ([place])   . OSA on CPAP   . Severe aortic stenosis   . Hyperlipidemia   . GERD (gastroesophageal reflux disease)   . Depression with anxiety   . Pain in joint of  left elbow 08/15/2018  . Neuritis of left ulnar nerve 08/15/2018  . Pain in left foot 08/15/2018  . Pain in joint of right hip 12/30/2017  . Obesity (BMI 35.0-39.9 without comorbidity) 04/12/2013    Past Surgical History:  Procedure Laterality Date  . ANKLE FUSION     left  . APPENDECTOMY    . CHOLECYSTECTOMY    . EYE SURGERY Bilateral 2020   cataracts removed August and September 2020  . JOINT REPLACEMENT Right 2003   three replacements; 2003 is last one  . KNEE ARTHROPLASTY    . LEG SURGERY     muscle surgery related to polio  . MASS EXCISION Right 01/08/2020   Procedure: EXCISION RIGHT LEG MASS;  Surgeon: Erroll Luna, MD;  Location: [facility];  Service: General;  Laterality: Right;  . RECTAL PROLAPSE REPAIR  2020   pessary placed  . RIGHT/LEFT HEART CATH AND CORONARY ANGIOGRAPHY N/A 04/02/2019   Procedure: RIGHT/LEFT HEART CATH AND CORONARY ANGIOGRAPHY;  Surgeon: Nelva Bush, MD;  Location: [place] [facility];  Service: Cardiovascular;  Laterality: N/A;  . TEE WITHOUT CARDIOVERSION N/A 04/17/2019   Procedure: TRANSESOPHAGEAL ECHOCARDIOGRAM (TEE);  Surgeon: Sherren Mocha, MD;  Location: [place] [facility];  Service: Open Heart Surgery;  Laterality: N/A;  . TOTAL HIP ARTHROPLASTY     right x 3  . TRANSCATHETER AORTIC VALVE REPLACEMENT, TRANSFEMORAL N/A 04/17/2019   Procedure: TRANSCATHETER AORTIC VALVE REPLACEMENT, TRANSFEMORAL;  Surgeon: Sherren Mocha, MD;  Location: [place] [facility];  Service: Open Heart Surgery;  Laterality: N/A;  . UMBILICAL HERNIA REPAIR       OB History   No obstetric history on file.     Family History  Problem Relation Age of Onset  . Cancer Mother   . Heart disease Father   . CAD Other        FAM Hx OF CAD  . Diabetes Other        FAM Hx of DM  . Hypertension Other     Social History   Tobacco Use  . Smoking status: Never Smoker  . Smokeless tobacco: Never Used  Vaping Use  . Vaping Use: Never used   Substance Use Topics  . Alcohol use: No  . Drug use: Never    Home Medications Prior to Admission medications   Medication Sig Start Date End Date Taking? Authorizing Provider  ALPRAZolam Duanne Moron) 0.5 MG tablet Take 0.5 mg by mouth 2 (two) times daily as needed for sleep.    Yes [provider]  aspirin 81 MG chewable tablet Chew 1 tablet (81 mg total) by mouth daily. 04/19/19  Yes Eileen Stanford, PA-C  Cholecalciferol 25 MCG (1000 UT) capsule Take 2,000 Units by mouth daily.    Yes [provider]  docusate sodium (COLACE) 100 MG capsule Take 100 mg by mouth daily.   Yes [provider]  fluticasone (FLONASE) 50 MCG/ACT nasal spray Place 1 spray into both nostrils as needed for allergies or rhinitis.   Yes [provider]  gabapentin (NEURONTIN) 300 MG capsule Take 300 mg by mouth daily.   Yes [provider]  pantoprazole (PROTONIX) 40 MG tablet Take 40 mg by mouth daily.   Yes [provider]  traZODone (DESYREL) 100 MG tablet Take 100 mg by mouth at bedtime.    Yes [provider]  venlafaxine XR (EFFEXOR-XR) 75 MG 24 hr capsule Take 75 mg by mouth daily with breakfast.   Yes [provider]  vitamin B-12 (CYANOCOBALAMIN) 250 MCG tablet Take 250 mcg by mouth daily.   Yes [provider]  azithromycin (ZITHROMAX) 500 MG tablet Take 1 tablet (500 mg) one hour prior to all dental visits. 03/19/20   Eileen Stanford, PA-C  cetirizine (ZYRTEC) 10 MG tablet Take 10 mg by mouth as needed for allergies.    [provider]    Allergies    Amoxicillin, Flagyl [metronidazole], Plaquenil [hydroxychloroquine sulfate], Codeine, Doxycycline, Other, Percocet [oxycodone-acetaminophen], and Sulfa antibiotics  Review of Systems   Review of Systems  All other systems reviewed and are negative.   Physical Exam Updated Vital Signs BP (!) 154/80 (BP Location: Left Arm)   Pulse 71   Temp 97.9 F (36.6 C)  (Oral)   Resp 20   Ht 1.727 m (5\' 8" )   Wt 118.8 kg   SpO2 99%   BMI 39.82 kg/m   Physical Exam Vitals and nursing note reviewed.  Constitutional:      General: She is not in acute distress.    Appearance: She is well-developed. She is obese.  HENT:     Head: Normocephalic and atraumatic.     Right Ear: External ear normal.     Left Ear: External ear normal.  Eyes:     General: No scleral icterus.       Right eye: No discharge.        Left eye: No discharge.     Conjunctiva/sclera: Conjunctivae normal.  Neck:     Trachea: No tracheal deviation.  Cardiovascular:     Rate and Rhythm: Normal rate and regular rhythm.  Pulmonary:     Effort: Pulmonary effort is normal. No respiratory distress.     Breath sounds: No stridor.  Abdominal:     General: There is no distension.     Palpations: There is no mass.     Tenderness: There is no abdominal tenderness. There is no guarding.  Musculoskeletal:        General: Tenderness present. No swelling or deformity.     Cervical back: Neck supple.     Comments: Tenderness palpation lumbar spine and right paraspinal muscle, tenderness palpation sacrum  Skin:    General: Skin is warm and dry.     Findings: No rash.  Neurological:     Mental Status: She is alert.     Cranial Nerves: Cranial nerve deficit: no gross deficits.     Motor: Weakness present.     Comments: Atrophy paralysis left lower extremity, weakness right lower extremity     ED Results / Procedures / Treatments   Labs (all labs ordered are listed, but only abnormal results are displayed) Labs Reviewed - No data to display  EKG None  Radiology No results found.  Procedures Procedures   Medications Ordered in ED Medications  HYDROcodone-acetaminophen (NORCO/VICODIN) 5-325 MG per tablet 1 tablet (has no administration in time range)  ondansetron (ZOFRAN-ODT) disintegrating tablet 4 mg (has no administration in time range)    ED Course  I have reviewed the  triage vital signs and the nursing notes.  Pertinent labs & imaging results that were available during my care of the patient were reviewed by me and considered in my medical decision making (see chart for details).    MDM Rules/Calculators/A&P                          Pt presents with complaints of persistent pain after fall down onto low commode.  Pain is in the lower back, pelvic area.  No tenderness to bilateral hips.  No pain with leg range of motion.  CT scan ordered of lumbar and pelvic area.  Pain med provided.  Care turned over to Dr Tamera Punt. Final Clinical Impression(s) / ED Diagnoses pending   Dorie Rank, MD 08/02/20 1350

## 2020-08-01 NOTE — ED Notes (Signed)
To ct

## 2020-08-01 NOTE — ED Provider Notes (Signed)
Care was taken over from Dr. Tomi Bamberger.  Patient who has lower extremity paralysis secondary to polio presents with lower back pain with right paraspinal tenderness that started several days ago after she sat down hard on a commode.  She does not seem to have any radicular symptoms.  Her neurologic exam is limited due to her baseline symptoms but she does not report any acute changes.  She had a CT scan of the lumbar spine and the pelvis which does not show any acute fractures.  We will start patient on a short course of prednisone and Vicodin for pain.  She has an appointment on Tuesday to follow-up with her orthopedist.  Return precautions were given.  Results for orders placed or performed in visit on 03/19/20  ECHOCARDIOGRAM COMPLETE  Result Value Ref Range   Area-P 1/2 2.48 cm2   S' Lateral 2.80 cm   AV Area mean vel 1.83 cm2   AR max vel 1.43 cm2   AV Area VTI 1.59 cm2   Ao pk vel 2.09 m/s   AV Mean grad 9.0 mmHg   AV Peak grad 17.5 mmHg   CT Lumbar Spine Wo Contrast  Result Date: 08/01/2020 CLINICAL DATA:  65 year old female with fall and trauma to the back. EXAM: CT LUMBAR SPINE WITHOUT CONTRAST TECHNIQUE: Multidetector CT imaging of the lumbar spine was performed without intravenous contrast administration. Multiplanar CT image reconstructions were also generated. COMPARISON:  CT abdomen pelvis dated 05/18/2019. FINDINGS: Segmentation: 5 lumbar type vertebrae. Alignment: No acute subluxation. There is grade 1 L2-L3 and L3-L4 retrolisthesis. Vertebrae: No acute fracture.  Osteopenia. Paraspinal and other soft tissues: Cholecystectomy. Mild aortoiliac atherosclerotic disease. No perispinal fluid collection or hematoma. Disc levels: Multilevel degenerative changes. There is mild levoscoliosis centered at L4. Multilevel disc desiccation and vacuum phenomena most prominent at L2-L3 and L3-L4. Lower lumbar facet arthropathy. IMPRESSION: 1. No acute/traumatic lumbar spine pathology. 2. Multilevel  degenerative changes and levoscoliosis. 3. Aortic Atherosclerosis (ICD10-I70.0). Electronically Signed   By: Anner Crete M.D.   On: 08/01/2020 15:47   CT PELVIS WO CONTRAST  Result Date: 08/01/2020 CLINICAL DATA:  [place] onto low toilet seat last week, pelvic pain EXAM: CT PELVIS WITHOUT CONTRAST TECHNIQUE: Multidetector CT imaging of the pelvis was performed following the standard protocol without intravenous contrast. COMPARISON:  05/18/2019 FINDINGS: Urinary Tract:  No abnormality visualized. Bowel: Descending and sigmoid diverticulosis. Status post appendectomy. Vascular/Lymphatic: No pathologically enlarged lymph nodes. No significant vascular abnormality seen. Reproductive:  No mass or other significant abnormality Other: Partially imaged midline ventral hernia repair with a persistent fat containing periumbilical hernia (series 6, image 27). Musculoskeletal: No displaced fracture of the pelvis or included bilateral proximal femurs. Status post right hip total arthroplasty. No evidence of perihardware fracture. IMPRESSION: 1. No displaced fracture of the pelvis or included bilateral proximal femurs. 2. Status post right hip total arthroplasty. No evidence of perihardware fracture. 3. Partially imaged midline ventral hernia repair with a persistent fat containing periumbilical hernia. Electronically Signed   By: Eddie Candle M.D.   On: 08/01/2020 15:37      Malvin Johns, MD 08/01/20 1627

## 2020-08-01 NOTE — ED Triage Notes (Signed)
She fell hard onto a low toilet seat a week ago. C.o pelvic pain. She has an appointment with [facility] next week. Pt is wheel chair dependant due to polio. Left leg, knee down paralysis.

## 2020-08-01 NOTE — ED Notes (Addendum)
Patient endorses fall on a low sitting toilet at home. Patient reports she "thinks her pelvis is broken". Patient has mobility issues due to post polio syndrome and she reports her left leg is paralyzed and her right leg is severely limited. Patient uses a motorized WC to get around.

## 2020-08-05 ENCOUNTER — Ambulatory Visit (AMBULATORY_SURGERY_CENTER): Payer: Medicare Other

## 2020-08-05 ENCOUNTER — Other Ambulatory Visit: Payer: Self-pay

## 2020-08-05 VITALS — Ht 68.0 in | Wt 260.0 lb

## 2020-08-05 DIAGNOSIS — Z1211 Encounter for screening for malignant neoplasm of colon: Secondary | ICD-10-CM

## 2020-08-05 DIAGNOSIS — M419 Scoliosis, unspecified: Secondary | ICD-10-CM | POA: Diagnosis not present

## 2020-08-05 DIAGNOSIS — Q7649 Other congenital malformations of spine, not associated with scoliosis: Secondary | ICD-10-CM | POA: Diagnosis not present

## 2020-08-05 DIAGNOSIS — M5459 Other low back pain: Secondary | ICD-10-CM | POA: Diagnosis not present

## 2020-08-05 NOTE — Progress Notes (Signed)
Pre visit completed via phone call; Patient verified name, DOB, and address;  No egg or soy allergy known to patient  Iissues with past sedation with any surgeries or procedures----PONV ---post-polio syndrome - needs less sedation than others would-but requesting to have enough to not remember procedures No intubation problems in the past  No FH of Malignant Hyperthermia No diet pills per patient No home 02 use per patient  No blood thinners per patient  Pt reports issues with constipation -- will advise use of Miralax daily x 5 days prior to prep; No A fib or A flutter  COVID 19 guidelines implemented in PV today with Pt and RN  Pt is fully vaccinated for Covid x 2 + booster= Pt denies loose or missing teeth;  Patient denies dentures, partials, dental implants, capped or bonded teeth; Coupon given to pt in PV today, Code to Pharmacy and  NO PA's for preps discussed with pt in PV today  Discussed with pt there will be an out-of-pocket cost for prep and that varies from $0 to 70 dollars;  Due to the COVID-19 pandemic we are asking patients to follow certain guidelines.  Pt aware of COVID protocols and [facility] guidelines

## 2020-08-07 DIAGNOSIS — M5459 Other low back pain: Secondary | ICD-10-CM | POA: Diagnosis not present

## 2020-08-08 DIAGNOSIS — Z952 Presence of prosthetic heart valve: Secondary | ICD-10-CM | POA: Diagnosis not present

## 2020-08-08 DIAGNOSIS — E785 Hyperlipidemia, unspecified: Secondary | ICD-10-CM | POA: Diagnosis not present

## 2020-08-08 DIAGNOSIS — M8580 Other specified disorders of bone density and structure, unspecified site: Secondary | ICD-10-CM | POA: Diagnosis not present

## 2020-08-08 DIAGNOSIS — M549 Dorsalgia, unspecified: Secondary | ICD-10-CM | POA: Diagnosis not present

## 2020-08-08 DIAGNOSIS — I509 Heart failure, unspecified: Secondary | ICD-10-CM | POA: Diagnosis not present

## 2020-08-08 DIAGNOSIS — G8104 Flaccid hemiplegia affecting left nondominant side: Secondary | ICD-10-CM | POA: Diagnosis not present

## 2020-08-08 DIAGNOSIS — R7309 Other abnormal glucose: Secondary | ICD-10-CM | POA: Diagnosis not present

## 2020-08-12 DIAGNOSIS — L659 Nonscarring hair loss, unspecified: Secondary | ICD-10-CM | POA: Diagnosis not present

## 2020-08-12 DIAGNOSIS — R7309 Other abnormal glucose: Secondary | ICD-10-CM | POA: Diagnosis not present

## 2020-08-12 DIAGNOSIS — R202 Paresthesia of skin: Secondary | ICD-10-CM | POA: Diagnosis not present

## 2020-08-12 DIAGNOSIS — G8104 Flaccid hemiplegia affecting left nondominant side: Secondary | ICD-10-CM | POA: Diagnosis not present

## 2020-08-12 DIAGNOSIS — M8588 Other specified disorders of bone density and structure, other site: Secondary | ICD-10-CM | POA: Diagnosis not present

## 2020-08-19 ENCOUNTER — Encounter: Payer: Medicare Other | Admitting: Gastroenterology

## 2020-08-19 DIAGNOSIS — Z4689 Encounter for fitting and adjustment of other specified devices: Secondary | ICD-10-CM | POA: Diagnosis not present

## 2020-08-19 DIAGNOSIS — M4316 Spondylolisthesis, lumbar region: Secondary | ICD-10-CM | POA: Diagnosis not present

## 2020-08-19 DIAGNOSIS — M5416 Radiculopathy, lumbar region: Secondary | ICD-10-CM | POA: Diagnosis not present

## 2020-08-19 DIAGNOSIS — M47896 Other spondylosis, lumbar region: Secondary | ICD-10-CM | POA: Diagnosis not present

## 2020-08-19 DIAGNOSIS — M5136 Other intervertebral disc degeneration, lumbar region: Secondary | ICD-10-CM | POA: Diagnosis not present

## 2020-08-20 ENCOUNTER — Other Ambulatory Visit: Payer: Self-pay | Admitting: Orthopaedic Surgery

## 2020-08-20 DIAGNOSIS — M5136 Other intervertebral disc degeneration, lumbar region: Secondary | ICD-10-CM

## 2020-08-27 DIAGNOSIS — M5136 Other intervertebral disc degeneration, lumbar region: Secondary | ICD-10-CM | POA: Diagnosis not present

## 2020-08-27 DIAGNOSIS — M5186 Other intervertebral disc disorders, lumbar region: Secondary | ICD-10-CM | POA: Diagnosis not present

## 2020-08-27 DIAGNOSIS — M47817 Spondylosis without myelopathy or radiculopathy, lumbosacral region: Secondary | ICD-10-CM | POA: Diagnosis not present

## 2020-08-27 DIAGNOSIS — M47816 Spondylosis without myelopathy or radiculopathy, lumbar region: Secondary | ICD-10-CM | POA: Diagnosis not present

## 2020-09-01 DIAGNOSIS — M5416 Radiculopathy, lumbar region: Secondary | ICD-10-CM | POA: Diagnosis not present

## 2020-09-01 DIAGNOSIS — M4186 Other forms of scoliosis, lumbar region: Secondary | ICD-10-CM | POA: Diagnosis not present

## 2020-09-01 DIAGNOSIS — M5136 Other intervertebral disc degeneration, lumbar region: Secondary | ICD-10-CM | POA: Diagnosis not present

## 2020-09-08 DIAGNOSIS — M5416 Radiculopathy, lumbar region: Secondary | ICD-10-CM | POA: Diagnosis not present

## 2020-09-17 ENCOUNTER — Other Ambulatory Visit: Payer: Medicare Other

## 2020-09-22 DIAGNOSIS — M4186 Other forms of scoliosis, lumbar region: Secondary | ICD-10-CM | POA: Diagnosis not present

## 2020-09-22 DIAGNOSIS — M5416 Radiculopathy, lumbar region: Secondary | ICD-10-CM | POA: Diagnosis not present

## 2020-09-22 DIAGNOSIS — M4316 Spondylolisthesis, lumbar region: Secondary | ICD-10-CM | POA: Diagnosis not present

## 2020-09-30 DIAGNOSIS — M47816 Spondylosis without myelopathy or radiculopathy, lumbar region: Secondary | ICD-10-CM | POA: Diagnosis not present

## 2020-10-17 DIAGNOSIS — W010XXA Fall on same level from slipping, tripping and stumbling without subsequent striking against object, initial encounter: Secondary | ICD-10-CM | POA: Diagnosis not present

## 2020-10-17 DIAGNOSIS — S8991XA Unspecified injury of right lower leg, initial encounter: Secondary | ICD-10-CM | POA: Diagnosis not present

## 2020-11-13 DIAGNOSIS — N8182 Incompetence or weakening of pubocervical tissue: Secondary | ICD-10-CM | POA: Diagnosis not present

## 2020-11-14 DIAGNOSIS — Z961 Presence of intraocular lens: Secondary | ICD-10-CM | POA: Diagnosis not present

## 2020-11-14 DIAGNOSIS — H43811 Vitreous degeneration, right eye: Secondary | ICD-10-CM | POA: Diagnosis not present

## 2020-11-21 DIAGNOSIS — J309 Allergic rhinitis, unspecified: Secondary | ICD-10-CM | POA: Diagnosis not present

## 2020-11-21 DIAGNOSIS — J019 Acute sinusitis, unspecified: Secondary | ICD-10-CM | POA: Diagnosis not present

## 2020-12-05 DIAGNOSIS — H04123 Dry eye syndrome of bilateral lacrimal glands: Secondary | ICD-10-CM | POA: Diagnosis not present

## 2020-12-05 DIAGNOSIS — H43813 Vitreous degeneration, bilateral: Secondary | ICD-10-CM | POA: Diagnosis not present

## 2020-12-18 DIAGNOSIS — H6501 Acute serous otitis media, right ear: Secondary | ICD-10-CM | POA: Diagnosis not present

## 2020-12-19 DIAGNOSIS — H04123 Dry eye syndrome of bilateral lacrimal glands: Secondary | ICD-10-CM | POA: Diagnosis not present

## 2020-12-26 IMAGING — CR DG CHEST 2V
2 series · 2 of 2 positions shown · non-contrast
Comparison: 09/25/2017

CLINICAL DATA: Acute chest pain for 1 day.

EXAM:
CHEST - 2 VIEW

[chest lat]
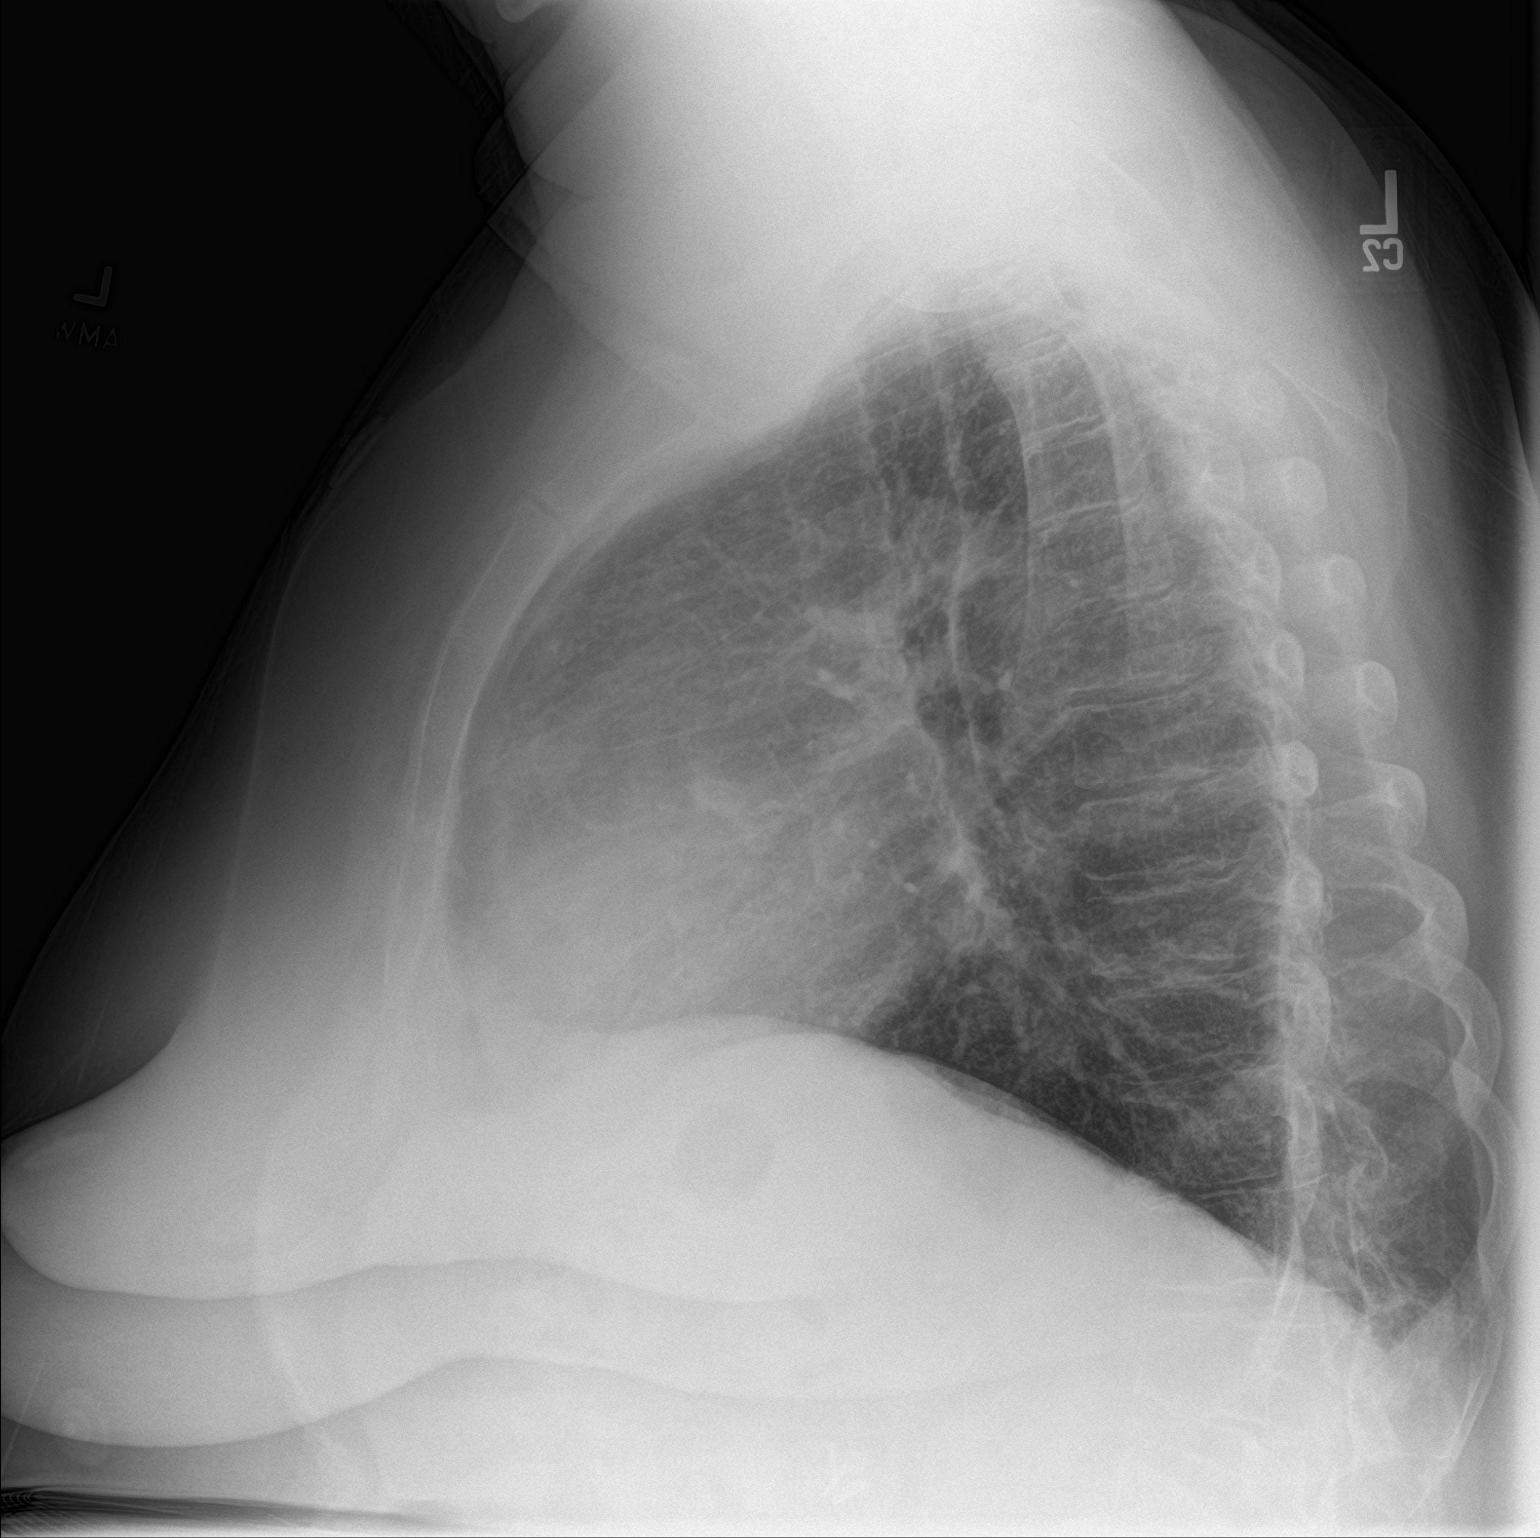

[chest ap]
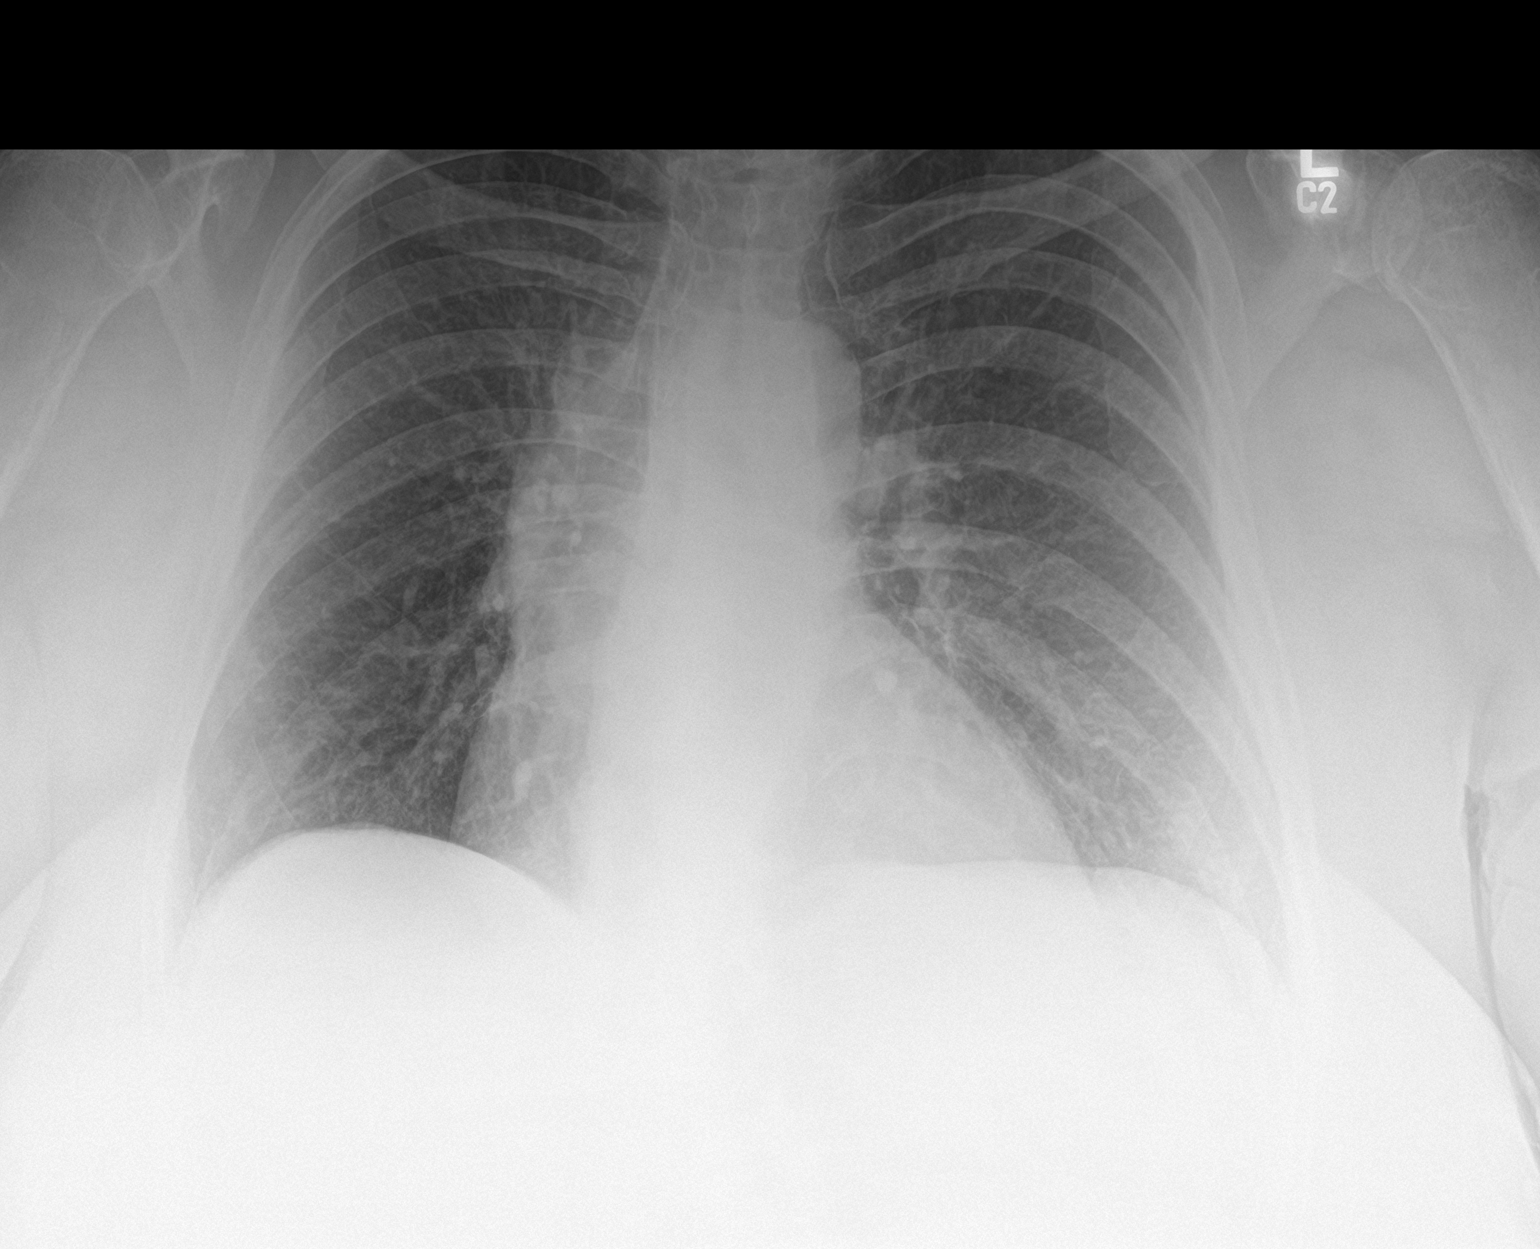

[2 of 2 positions shown; findings below may reference images not displayed]

FINDINGS: The cardiomediastinal silhouette is unremarkable.

There is no evidence of focal airspace disease, pulmonary edema,
suspicious pulmonary nodule/mass, pleural effusion, or pneumothorax.

No acute bony abnormalities are identified.
IMPRESSION: No active cardiopulmonary disease.

## 2021-01-02 IMAGING — US US EXTREM  UP VENOUS*L*
1 series · 13 of 24 positions shown · non-contrast
Comparison: None.

CLINICAL DATA: Left upper extremity pain and edema for the past 2
days. Evaluate for DVT



[Series 1: us extrem up venous*left* · 13 of 41 slices shown]
[im 1/41]
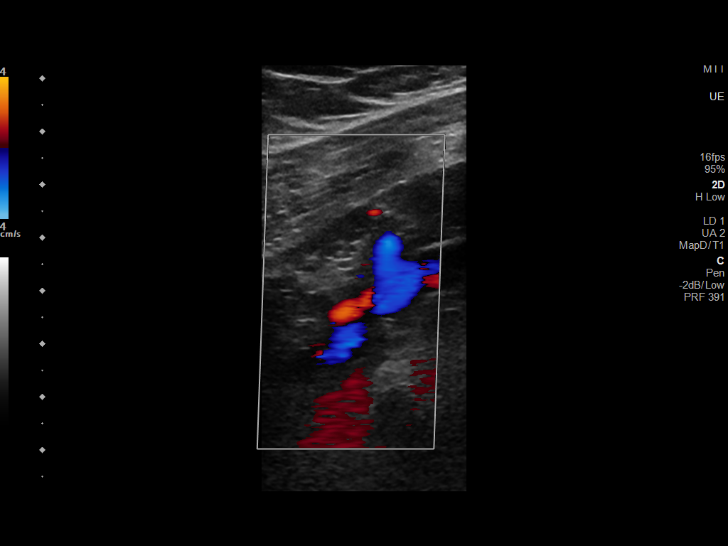
[im 4/41]
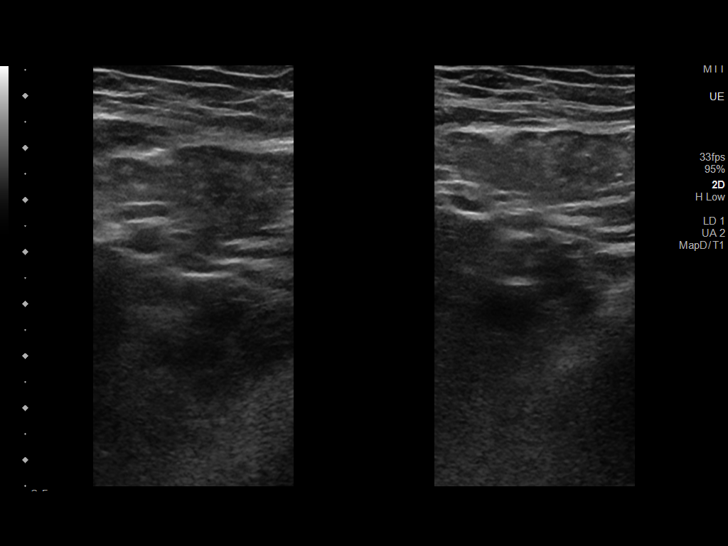
[im 7/41]
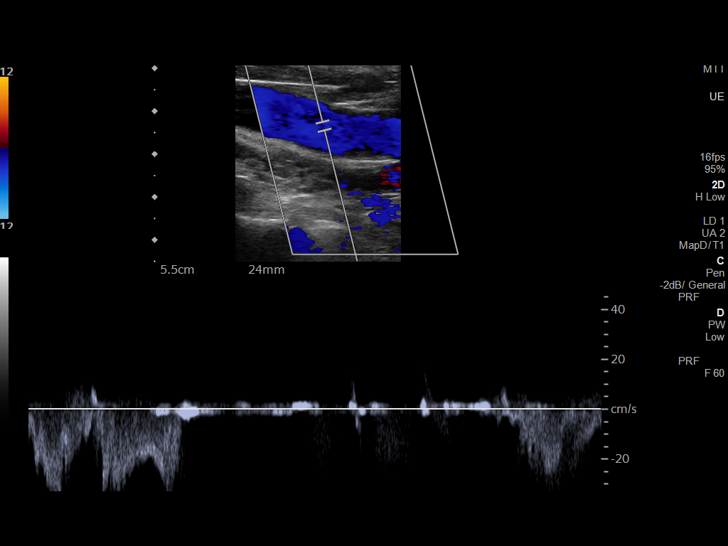
[im 11/41]
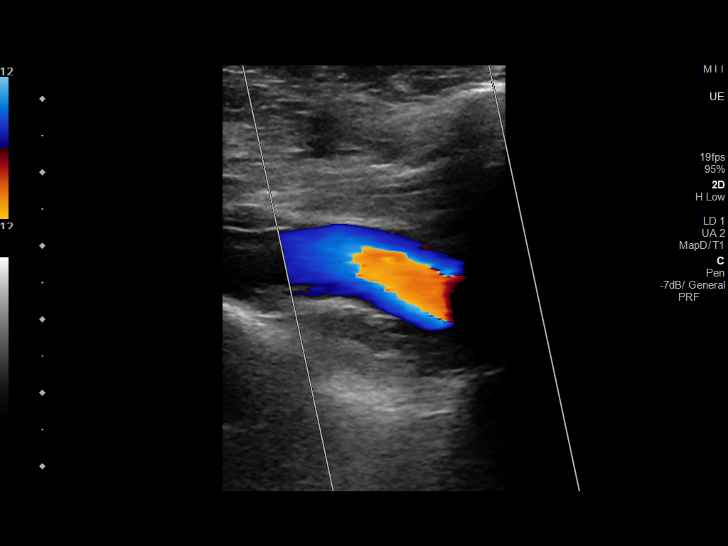
[im 14/41]
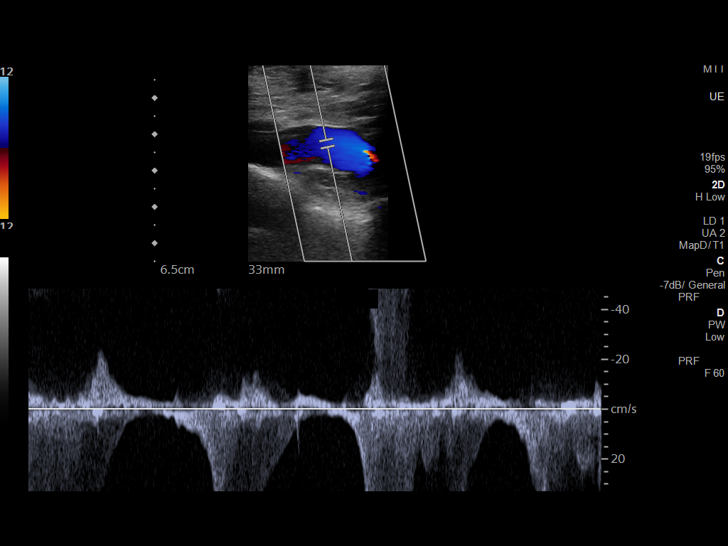
[im 18/41]
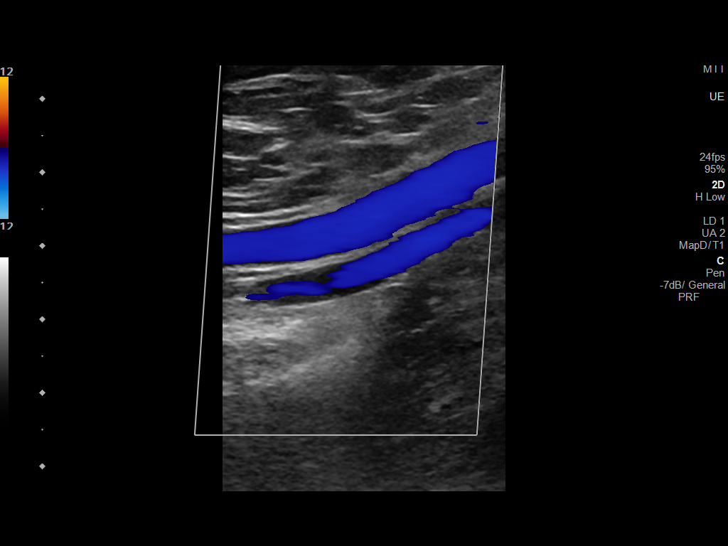
[im 21/41]
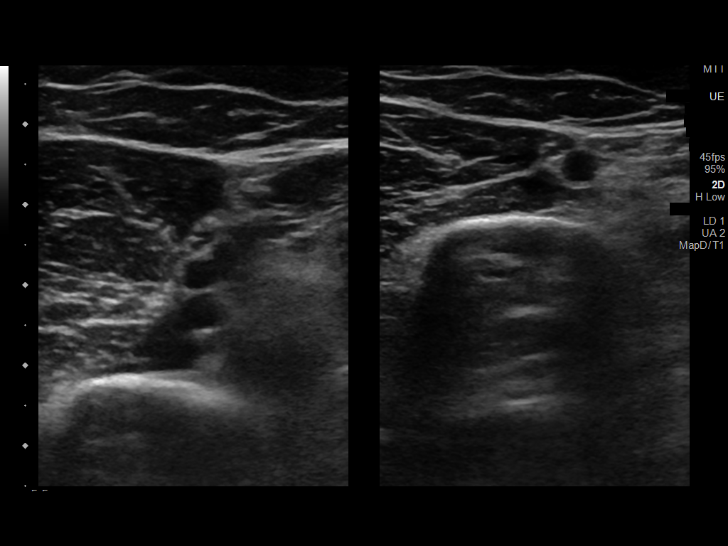
[im 23/41]
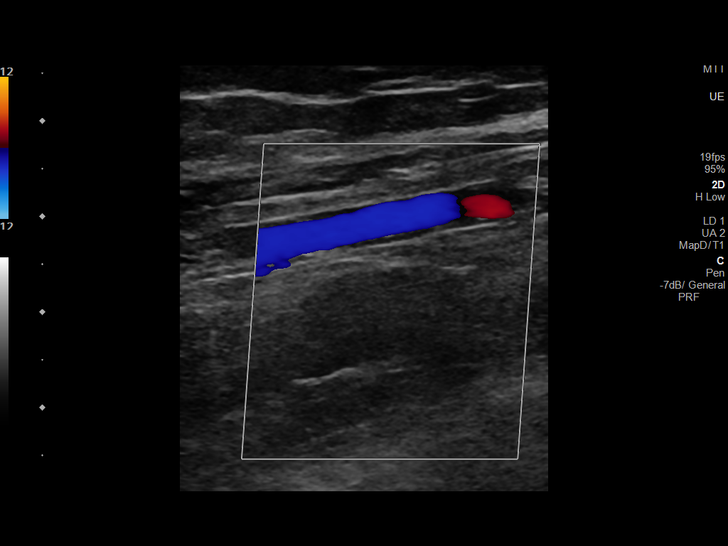
[im 27/41]
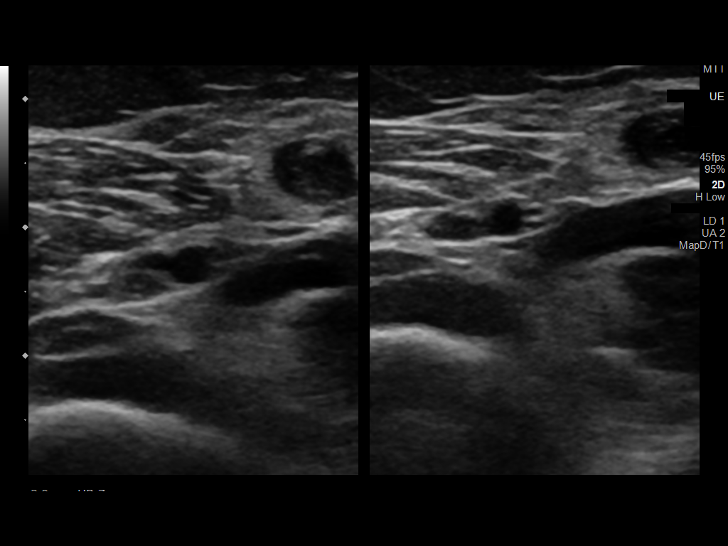
[im 30/41]
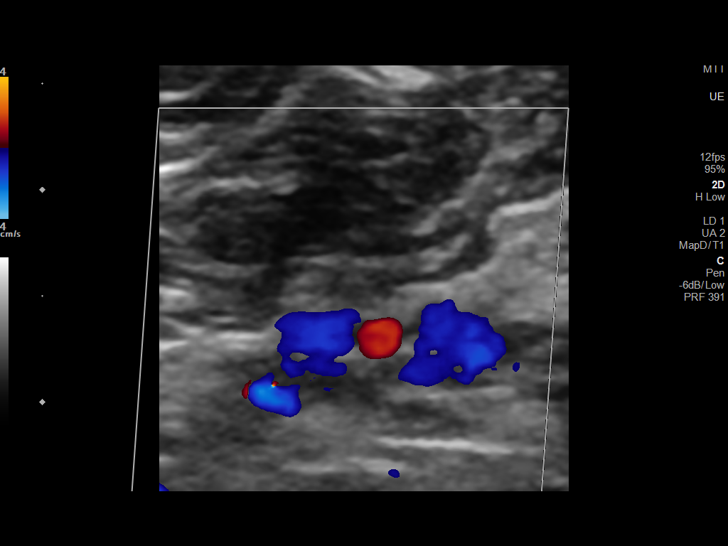
[im 34/41]
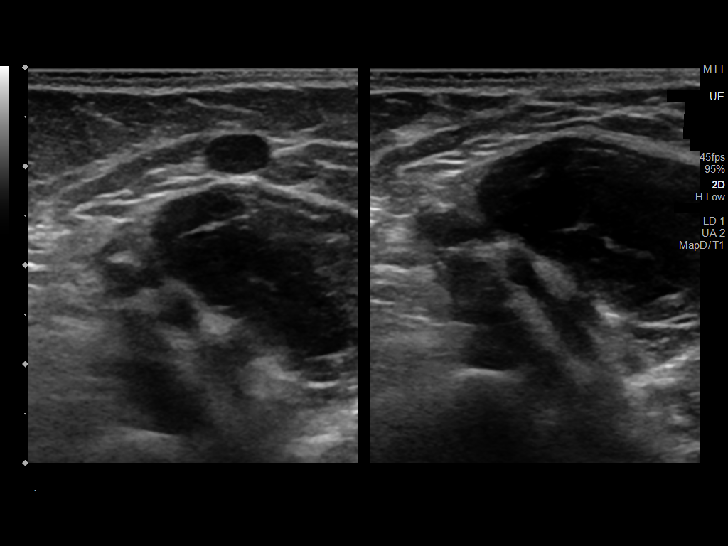
[im 37/41]
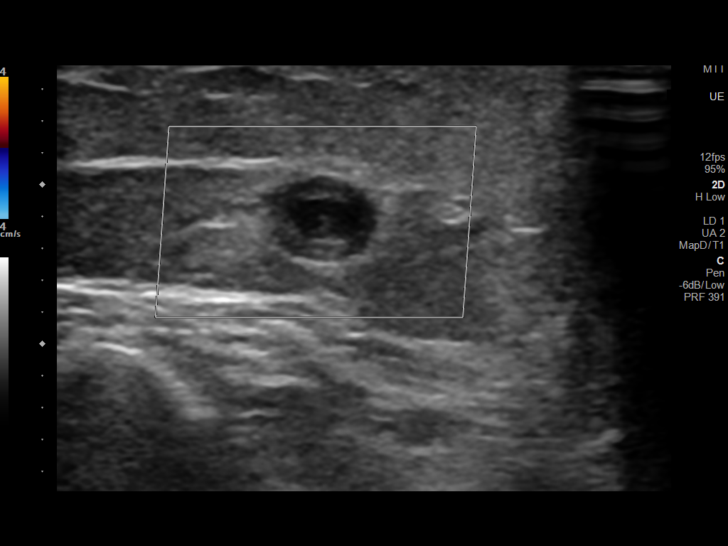
[im 41/41]
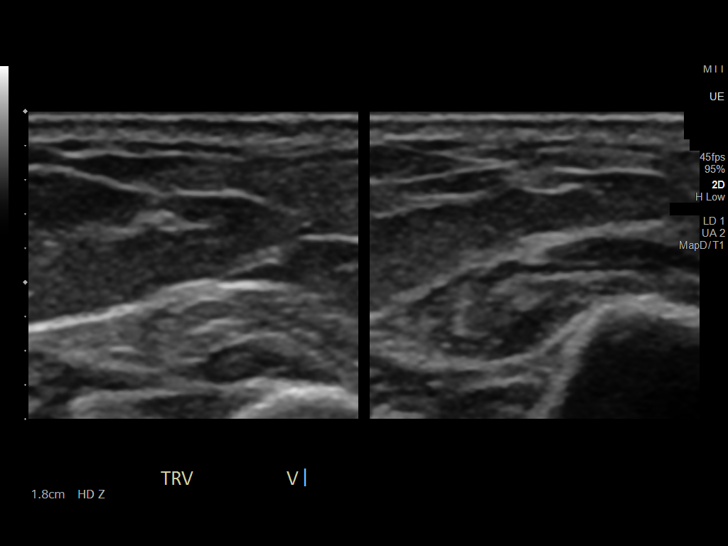

[13 of 24 positions shown; findings below may reference images not displayed]

FINDINGS: Contralateral Subclavian Vein: Respiratory phasicity is normal and
symmetric with the symptomatic side. No evidence of thrombus. Normal
compressibility.

Internal Jugular Vein: No evidence of thrombus. Normal
compressibility, respiratory phasicity and response to augmentation.

Subclavian Vein: No evidence of thrombus. Normal compressibility,
respiratory phasicity and response to augmentation.

Axillary Vein: No evidence of thrombus. Normal compressibility,
respiratory phasicity and response to augmentation.

Cephalic Vein: While a cephalic vein appears patent proximally
(image 42), there is hypoechoic occlusive thrombus within the
cephalic vein at the level of the forearm (image 41).

Basilic Vein: While the basilic vein appears patent proximally
(images 33 through 35), there is hypoechoic occlusive thrombus
within the basilic vein at the level the forearm (image 36 through
40).

Brachial Veins: No evidence of thrombus. Normal compressibility,
respiratory phasicity and response to augmentation.

Radial Veins: No evidence of thrombus. Normal compressibility,
respiratory phasicity and response to augmentation.

Ulnar Veins: No evidence of thrombus. Normal compressibility,
respiratory phasicity and response to augmentation.

Venous Reflux:  None visualized.

Other Findings:  None visualized.
IMPRESSION: 1. No evidence of DVT within the left upper extremity.
2. Examination is positive for occlusive superficial
thrombophlebitis involving the cephalic and basilic veins at the
level of the forearm. There is no extension of this distal occlusive
SVT to the more proximal superficial venous system or to the deep
venous system of the left upper extremity.

## 2021-01-09 IMAGING — CR DG CHEST 2V
2 series · 2 of 2 positions shown · non-contrast
Comparison: 03/30/2019

CLINICAL DATA: 63-year-old female with a history preoperative exam
for TAVR

EXAM:
CHEST - 2 VIEW

[w chest lat]
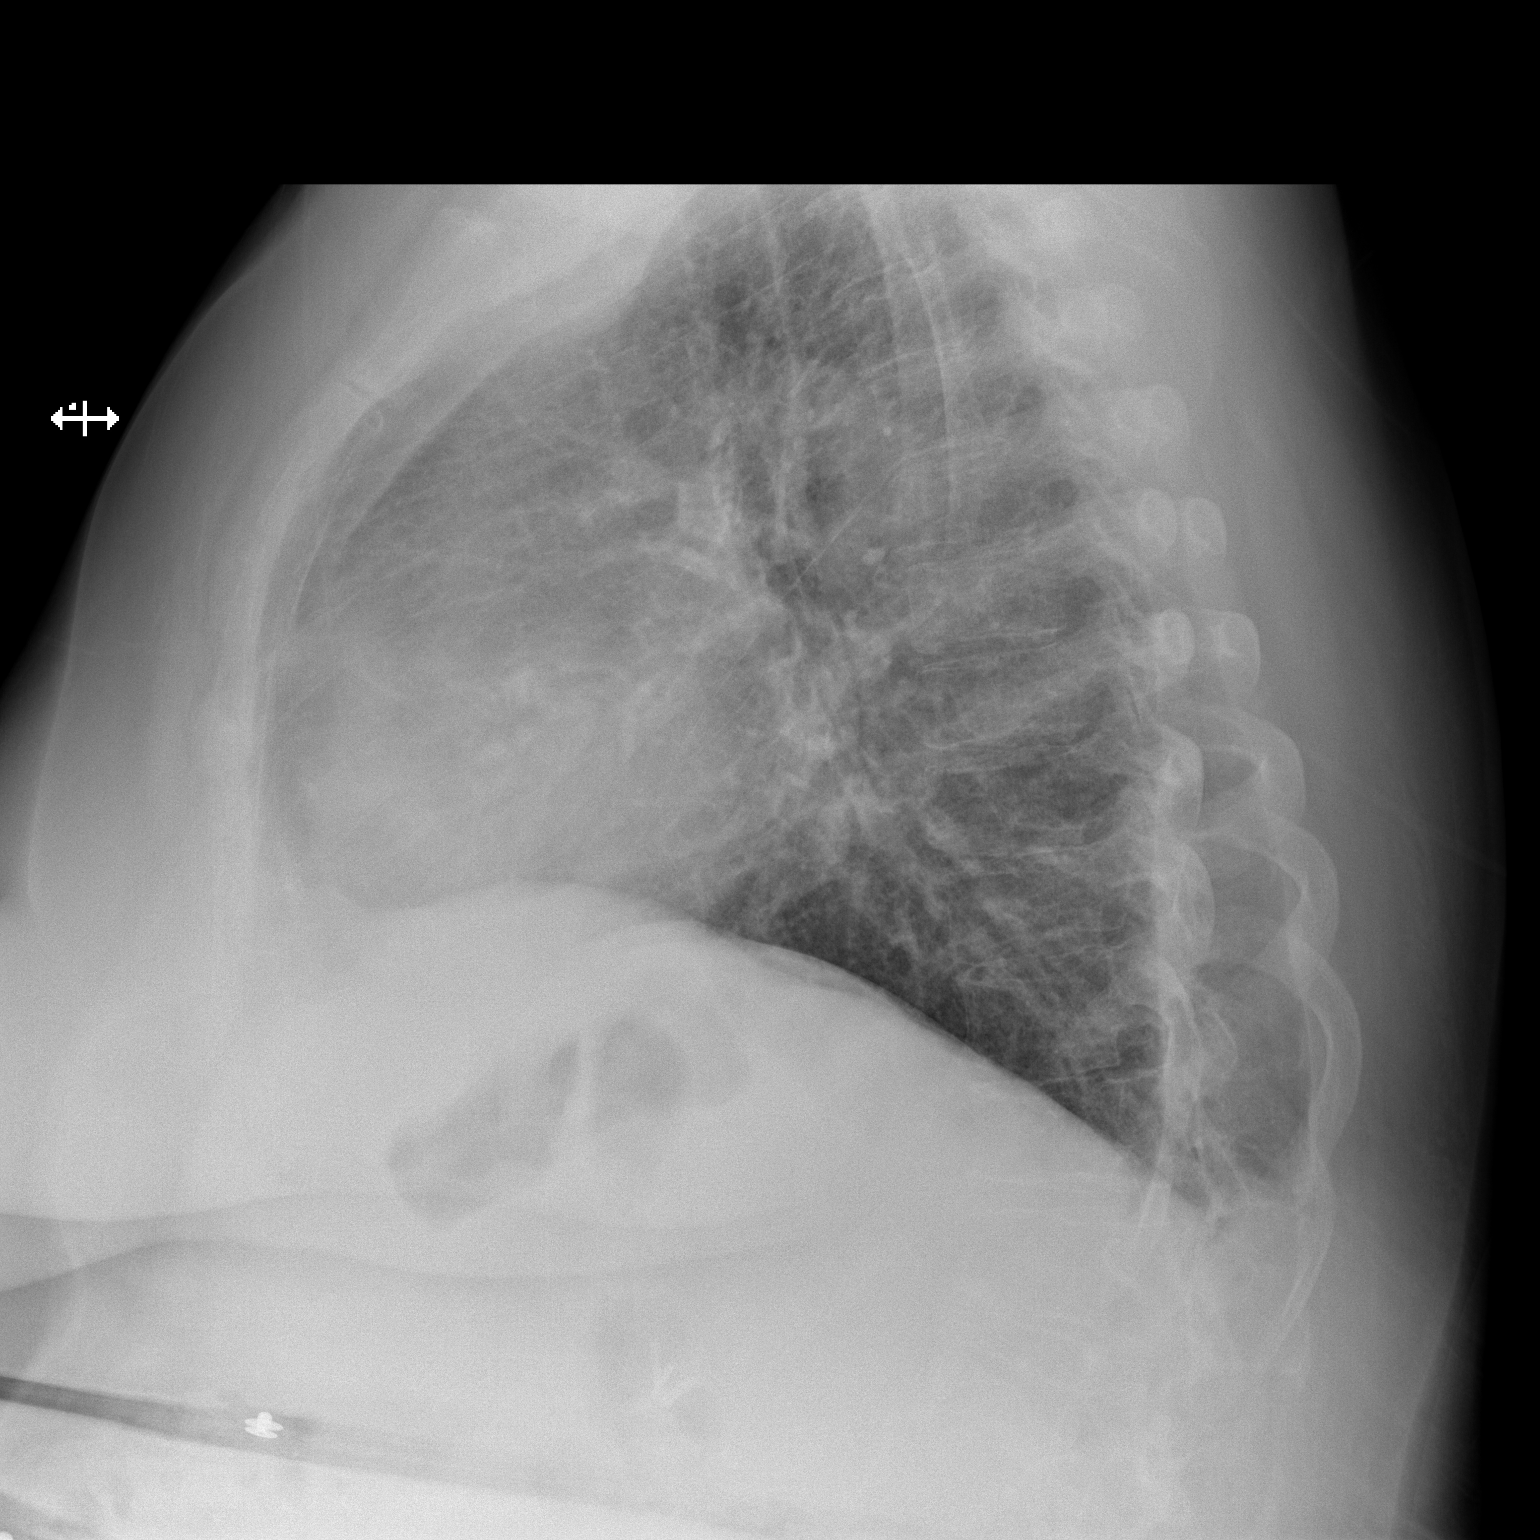

[x chest ap]
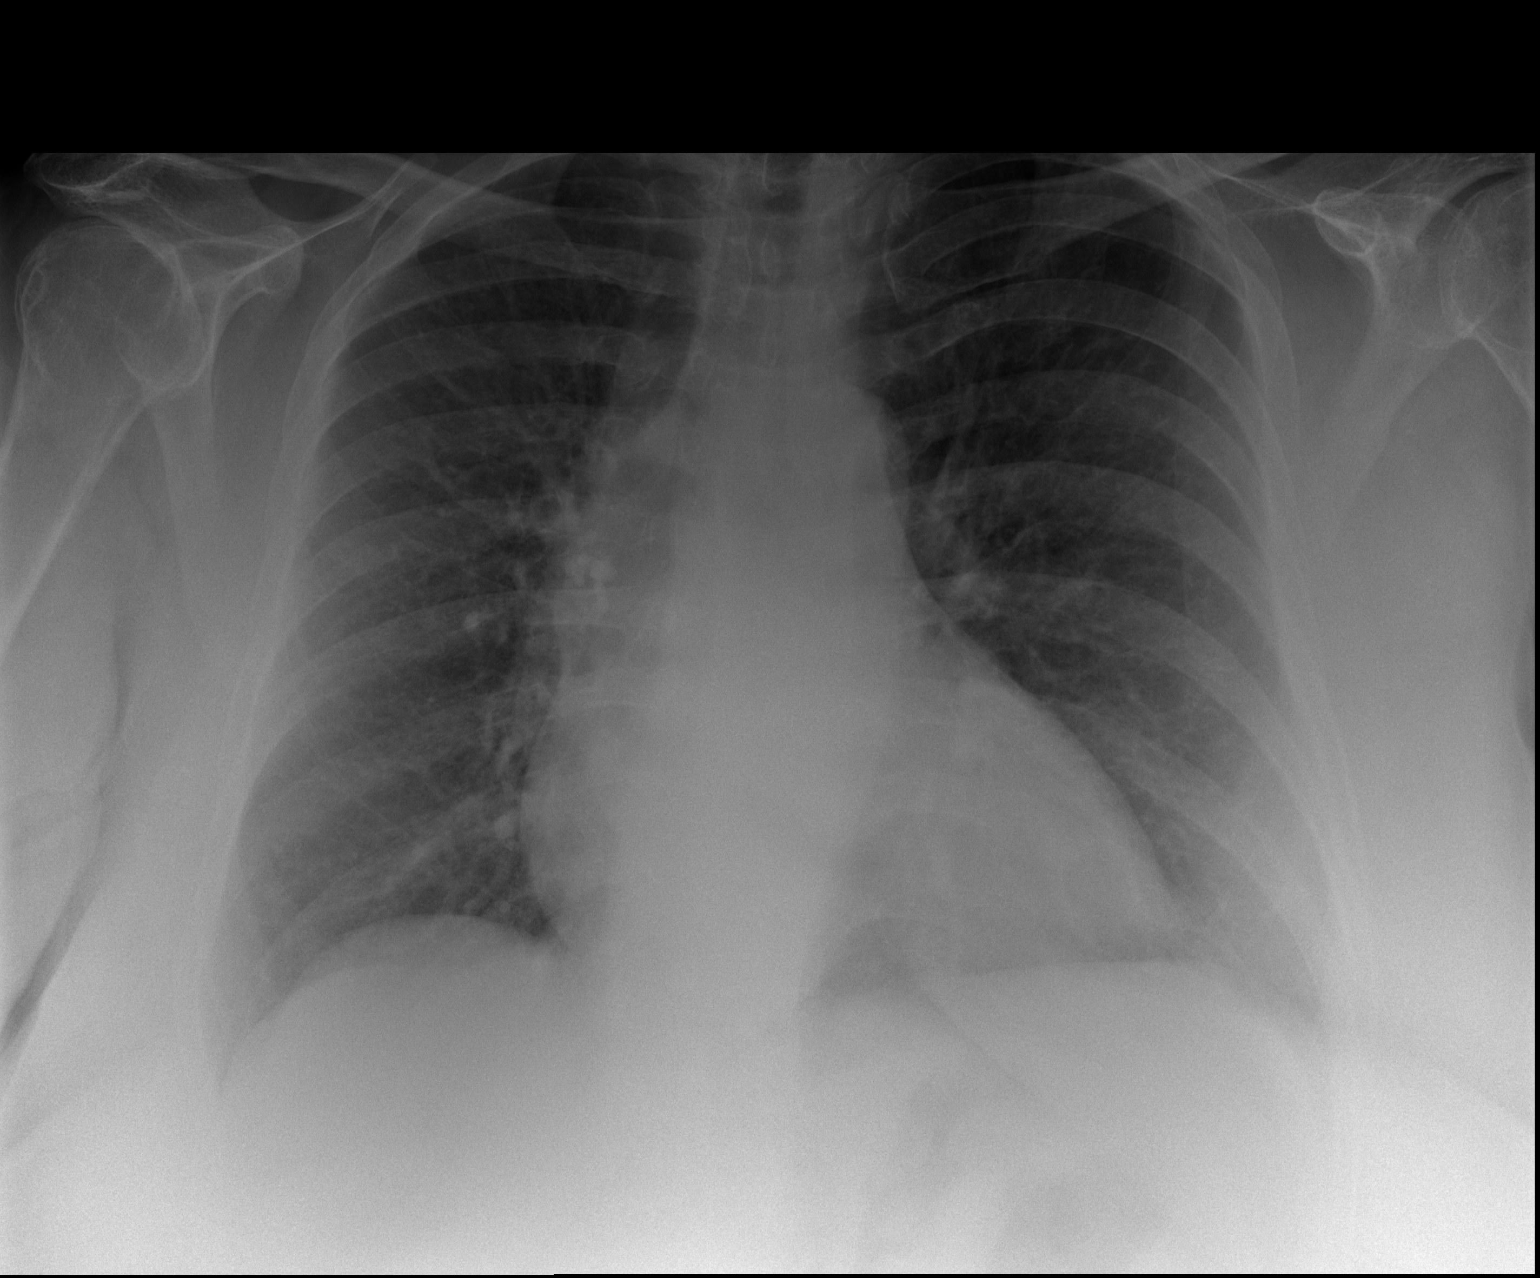

[2 of 2 positions shown; findings below may reference images not displayed]

FINDINGS: Cardiomediastinal silhouette unchanged in size and contour.
Calcifications of the aortic valve. No evidence of central vascular
congestion. No pneumothorax or pleural effusion. Coarsened
interstitial markings persist with no confluent airspace disease.
IMPRESSION: Negative for acute cardiopulmonary disease.

Aortic valve calcifications

## 2021-01-13 DIAGNOSIS — Z961 Presence of intraocular lens: Secondary | ICD-10-CM | POA: Diagnosis not present

## 2021-01-13 DIAGNOSIS — H43813 Vitreous degeneration, bilateral: Secondary | ICD-10-CM | POA: Diagnosis not present

## 2021-01-13 DIAGNOSIS — H52203 Unspecified astigmatism, bilateral: Secondary | ICD-10-CM | POA: Diagnosis not present

## 2021-01-17 NOTE — Progress Notes (Signed)
Cardiology Office Note   Date:  01/19/2021   ID:  [name], DOB 02-Jan-1956, MRN [id number]  PCP:  [name], MD  Cardiologist:   Minus Breeding, MD  Chief Complaint  Patient presents with   AVR       History of Present Illness: [name] is a 65 y.o. female who presents for follow up of severe aortic stenosis s/p TAVR (04/17/19).   Since I last saw her she has done well from a cardiovascular standpoint.  Her echo in October showed well-preserved ventricular function with a normally functioning 26 mm sapient prosthetic valve.  There was no PVL.  Mean gradient was 9.  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  She denies any chest pressure, neck or arm discomfort.  She has no new shortness of breath, PND or orthopnea.  Unfortunately she has a CPAP that is on recall and she was to stop using this immediately but she cannot stop using it because she has such disturbed sleep without it.  She has been waiting for a new device and her blood pressures been going up slowly as she has been waiting for the CPAP   Past Medical History:  Diagnosis Date   Allergy    seasonal allergies   Anxiety    on meds   Arthritis    low back issues   Carpal tunnel syndrome    LEFT   Cataract    bilateral -sx   Cervical pain (neck)    Complication of anesthesia    DIFFICULTY WAKING UP   Coronary artery disease    Depression    on meds   Diverticulitis    GERD (gastroesophageal reflux disease)    on meds   Heart murmur 2020   post aortic valve replaced   Hyperlipidemia    diet controlled- not on meds at this time (08/05/2020)   Insomnia    Mass of right lower leg    Morbid obesity (HCC)    Neuromuscular disorder (HCC)    OSA on CPAP    uses CPAP nightly   Osteopenia    neck   PONV (postoperative nausea and vomiting)    Post-polio syndrome    With left leg paralysis   S/P TAVR (transcatheter aortic valve  replacement)    Severe aortic stenosis    Sigmoid diverticulosis    Sleep apnea    uses CPAP   Ulnar nerve abnormality     Past Surgical History:  Procedure Laterality Date   ANKLE FUSION     left   APPENDECTOMY     CHOLECYSTECTOMY     EYE SURGERY Bilateral 2020   cataracts removed August and September 2020   JOINT REPLACEMENT Right 2003   three replacements; 2003 is last one   KNEE ARTHROPLASTY     LEG SURGERY     muscle surgery related to polio   MASS EXCISION Right 01/08/2020   Procedure: EXCISION RIGHT LEG MASS;  Surgeon: Erroll Luna, MD;  Location: [facility];  Service: General;  Laterality: Right;   RECTAL PROLAPSE REPAIR  2020   pessary placed   RIGHT/LEFT HEART CATH AND CORONARY ANGIOGRAPHY N/A 04/02/2019   Procedure: RIGHT/LEFT HEART CATH AND CORONARY ANGIOGRAPHY;  Surgeon: Nelva Bush, MD;  Location: [facility];  Service: Cardiovascular;  Laterality: N/A;   TEE WITHOUT CARDIOVERSION N/A 04/17/2019   Procedure: TRANSESOPHAGEAL ECHOCARDIOGRAM (TEE);  Surgeon: Sherren Mocha, MD;  Location: [facility];  Service: Open Heart Surgery;  Laterality: N/A;   TOTAL HIP ARTHROPLASTY     right x 3   TRANSCATHETER AORTIC VALVE REPLACEMENT, TRANSFEMORAL N/A 04/17/2019   Procedure: TRANSCATHETER AORTIC VALVE REPLACEMENT, TRANSFEMORAL;  Surgeon: Sherren Mocha, MD;  Location: [facility];  Service: Open Heart Surgery;  Laterality: N/A;   UMBILICAL HERNIA REPAIR       Current Outpatient Medications  Medication Sig Dispense Refill   ALPRAZolam (XANAX) 0.5 MG tablet Take 0.5 mg by mouth 2 (two) times daily as needed for sleep.      aspirin EC 81 MG tablet Take 81 mg by mouth daily. Swallow whole.     cetirizine (ZYRTEC) 10 MG tablet Take 10 mg by mouth as needed for allergies.     Cholecalciferol 25 MCG (1000 UT) capsule Take 2,000 Units by mouth daily.      clotrimazole-betamethasone (LOTRISONE) cream as needed.     docusate sodium  (COLACE) 100 MG capsule Take 100 mg by mouth daily.     fluticasone (FLONASE) 50 MCG/ACT nasal spray Place 1 spray into both nostrils as needed for allergies or rhinitis.     gabapentin (NEURONTIN) 300 MG capsule Take 300 mg by mouth daily.     hydrocortisone 2.5 % cream as needed.     ketoconazole (NIZORAL) 2 % cream Apply topically as needed.     pantoprazole (PROTONIX) 40 MG tablet Take 40 mg by mouth daily.     traZODone (DESYREL) 100 MG tablet Take 100 mg by mouth at bedtime.      venlafaxine XR (EFFEXOR-XR) 75 MG 24 hr capsule Take 75 mg by mouth daily with breakfast.     vitamin B-12 (CYANOCOBALAMIN) 250 MCG tablet Take 250 mcg by mouth daily.     aspirin 81 MG chewable tablet Chew 1 tablet (81 mg total) by mouth daily. (Patient not taking: Reported on 01/19/2021)     azithromycin (ZITHROMAX) 500 MG tablet Take 1 tablet (500 mg) one hour prior to all dental visits. (Patient not taking: Reported on 01/19/2021) 4 tablet 12   HYDROcodone-acetaminophen (NORCO/VICODIN) 5-325 MG tablet Take 1 tablet by mouth every 4 (four) hours as needed. (Patient not taking: Reported on 01/19/2021) 10 tablet 0   HYDROcodone-acetaminophen (NORCO/VICODIN) 5-325 MG tablet TAKE 1 TABLET BY MOUTH EVERY 4 (FOUR) HOURS AS NEEDED. (Patient not taking: Reported on 01/19/2021) 10 tablet 0   predniSONE (DELTASONE) 20 MG tablet TAKE 3 TABLETS BY MOUTH ON DAY 1 THEN TAKE 2 TABLETS DAILY FOR 4 DAYS (Patient not taking: Reported on 01/19/2021) 11 tablet 0   No current facility-administered medications for this visit.    Allergies:   Amoxicillin, Flagyl [metronidazole], Plaquenil [hydroxychloroquine sulfate], Codeine, Doxycycline, Other, Percocet [oxycodone-acetaminophen], and Sulfa antibiotics    ROS:  Please see the history of present illness.   Otherwise, review of systems are positive for none.   All other systems are reviewed and negative.    PHYSICAL EXAM: VS:  BP 136/90 (BP Location: Left Arm, Patient Position: Sitting,  Cuff Size: Large)   Pulse 65   Ht '5\' 8"'$  (1.727 m)   Wt 250 lb (113.4 kg)   SpO2 98%   BMI 38.01 kg/m  , BMI Body mass index is 38.01 kg/m. GEN:  No distress NECK:  No jugular venous distention at 90 degrees, waveform within normal limits, carotid upstroke brisk and symmetric, no bruits, no thyromegaly LYMPHATICS:  No cervical adenopathy LUNGS:  Clear to auscultation bilaterally BACK:  No CVA tenderness CHEST:  Unremarkable HEART:  S1 and S2 within normal limits, no S3, no S4, no clicks, no rubs, 2 out of 6 apical systolic murmur not radiating, no diastolic murmurs ABD:  Positive bowel sounds normal in frequency in pitch, no bruits, no rebound, no guarding, unable to assess midline mass or bruit with the patient seated. EXT:  2 plus pulses throughout, no edema, no cyanosis no clubbing SKIN:  No rashes no nodules NEURO:  Cranial nerves II through XII grossly intact, motor grossly intact throughout PSYCH:  Cognitively intact, oriented to person place and time   EKG:  EKG is  ordered today. The ekg ordered today demonstrates sinus rhythm, rate 65, leftward axis, early transition in V2, nonspecific bilateral T wave flattening.   Recent Labs: No results found for requested labs within last 8760 hours.    Lipid Panel    Component Value Date/Time   CHOL 201 (H) 03/31/2019 0607   TRIG 93 03/31/2019 0607   HDL 44 03/31/2019 0607   CHOLHDL 4.6 03/31/2019 0607   VLDL 19 03/31/2019 0607   LDLCALC 138 (H) 03/31/2019 0607      Wt Readings from Last 3 Encounters:  01/19/21 250 lb (113.4 kg)  08/05/20 260 lb (117.9 kg)  08/01/20 261 lb 14.5 oz (118.8 kg)      Other studies Reviewed: Additional studies/ records that were reviewed today include:   Echo Review of the above records demonstrates:  Please see elsewhere in the note.     ASSESSMENT AND PLAN:  Severe AS/status post TAVR:     She had stable valve in October.  No further imaging is indicated.  She understands endocarditis  prophylaxis and has to take Zithromax because she is intolerant of other.    Chronic diastolic CHF:   This was treated with valve replacement and she is now euvolemic.   OSA:    She is awaiting her new CPAP.  HTN: Her blood pressure is borderline.  She does not want to start medications at this time but she will let me know if her blood pressure has been creeping up and we might have to start something.  She thinks she has lost some weight because she cannot judge this because she cannot get weights.  Her dress size is down.  I encouraged more of the same.   Current medicines are reviewed at length with the patient today.  The patient does not have concerns regarding medicines.  The following changes have been made:  None  Labs/ tests ordered today include: None  Orders Placed This Encounter  Procedures   EKG 12-Lead      Disposition:   FU with APP in 12 months.     Signed, Minus Breeding, MD  01/19/2021 12:45 PM    [place]

## 2021-01-19 ENCOUNTER — Other Ambulatory Visit: Payer: Self-pay

## 2021-01-19 ENCOUNTER — Ambulatory Visit: Payer: Medicare Other | Admitting: Cardiology

## 2021-01-19 ENCOUNTER — Encounter: Payer: Self-pay | Admitting: Cardiology

## 2021-01-19 VITALS — BP 136/90 | HR 65 | Ht 68.0 in | Wt 250.0 lb

## 2021-01-19 DIAGNOSIS — Z952 Presence of prosthetic heart valve: Secondary | ICD-10-CM

## 2021-01-19 DIAGNOSIS — R079 Chest pain, unspecified: Secondary | ICD-10-CM | POA: Diagnosis not present

## 2021-01-19 DIAGNOSIS — I5032 Chronic diastolic (congestive) heart failure: Secondary | ICD-10-CM

## 2021-01-19 DIAGNOSIS — Z9989 Dependence on other enabling machines and devices: Secondary | ICD-10-CM | POA: Diagnosis not present

## 2021-01-19 DIAGNOSIS — G4733 Obstructive sleep apnea (adult) (pediatric): Secondary | ICD-10-CM

## 2021-01-19 NOTE — Patient Instructions (Signed)
Medication Instructions:  No chnages *If you need a refill on your cardiac medications before your next appointment, please call your pharmacy*   Lab Work: None ordered If you have labs (blood work) drawn today and your tests are completely normal, you will receive your results only by: [place] (if you have MyChart) OR A paper copy in the mail If you have any lab test that is abnormal or we need to change your treatment, we will call you to review the results.   Testing/Procedures: None ordered   Follow-Up: At [facility], you and your health needs are our priority.  As part of our continuing mission to provide you with exceptional heart care, we have created designated Provider Care Teams.  These Care Teams include your primary Cardiologist (physician) and Advanced Practice Providers (APPs -  Physician Assistants and Nurse Practitioners) who all work together to provide you with the care you need, when you need it.  We recommend signing up for the patient portal called "MyChart".  Sign up information is provided on this After Visit Summary.  MyChart is used to connect with patients for Virtual Visits (Telemedicine).  Patients are able to view lab/test results, encounter notes, upcoming appointments, etc.  Non-urgent messages can be sent to your provider as well.   To learn more about what you can do with MyChart, go to [email or web address].    Your next appointment:   12 month(s)  The format for your next appointment:   In Person  Provider:   You may see Minus Breeding, MD or one of the following Advanced Practice Providers on your designated Care Team:   Rosaria Ferries, PA-C Caron Presume, PA-C Jory Sims, DNP, ANP

## 2021-01-23 DIAGNOSIS — Z1231 Encounter for screening mammogram for malignant neoplasm of breast: Secondary | ICD-10-CM | POA: Diagnosis not present

## 2021-02-27 DIAGNOSIS — R7309 Other abnormal glucose: Secondary | ICD-10-CM | POA: Diagnosis not present

## 2021-02-27 DIAGNOSIS — L309 Dermatitis, unspecified: Secondary | ICD-10-CM | POA: Diagnosis not present

## 2021-02-27 DIAGNOSIS — E785 Hyperlipidemia, unspecified: Secondary | ICD-10-CM | POA: Diagnosis not present

## 2021-02-27 DIAGNOSIS — Z23 Encounter for immunization: Secondary | ICD-10-CM | POA: Diagnosis not present

## 2021-02-27 DIAGNOSIS — Z0000 Encounter for general adult medical examination without abnormal findings: Secondary | ICD-10-CM | POA: Diagnosis not present

## 2021-03-02 ENCOUNTER — Encounter: Payer: Self-pay | Admitting: Gastroenterology

## 2021-03-03 DIAGNOSIS — N8182 Incompetence or weakening of pubocervical tissue: Secondary | ICD-10-CM | POA: Diagnosis not present

## 2021-03-16 DIAGNOSIS — G8104 Flaccid hemiplegia affecting left nondominant side: Secondary | ICD-10-CM | POA: Diagnosis not present

## 2021-03-19 ENCOUNTER — Telehealth: Payer: Self-pay | Admitting: *Deleted

## 2021-03-19 ENCOUNTER — Ambulatory Visit (AMBULATORY_SURGERY_CENTER): Payer: Medicare Other | Admitting: *Deleted

## 2021-03-19 ENCOUNTER — Other Ambulatory Visit: Payer: Self-pay

## 2021-03-19 VITALS — Ht 68.0 in | Wt 250.0 lb

## 2021-03-19 DIAGNOSIS — Z86010 Personal history of colonic polyps: Secondary | ICD-10-CM

## 2021-03-19 NOTE — Telephone Encounter (Signed)
Patty,  Could you set this up at the hospital please?  Thanks, [employer]

## 2021-03-19 NOTE — Telephone Encounter (Signed)
Left message on machine to call back  

## 2021-03-19 NOTE — Telephone Encounter (Signed)
Okay for direct colonoscopy at [facility] or [facility]. If the patient wants to meet me before the procedure I am fine with that and she can be put into an overbook slot or be seen by one of the APP's. Thanks. GM

## 2021-03-19 NOTE — Telephone Encounter (Signed)
Dr. Rush Landmark, This pt has a hx of polyps and is due for a recall colonoscopy.  She is non-ambulatory, so she cannot be done at [facility].   Are you ok to schedule directly to the hospital or would you like an OV first?  Thanks, Cyril Mourning

## 2021-03-19 NOTE — Telephone Encounter (Signed)
I spoke with the pt and discussed that the colon needs to be at the hospital.  She has several questions and asked to be seen to discuss with Dr Rush Landmark her concerns.  I have scheduled her for 10/14 at 330 pm.

## 2021-03-19 NOTE — Progress Notes (Signed)
Pt's previsit is done over the phone and all paperwork (prep instructions, blank consent form to just read over) sent to patient.  Pt's name and DOB verified at the beginning of the previsit.   Pt is in a wheelchair- she is able to transfer but is unable to ambulate at all. I explained that procedure will need to done at the hospital instead of at the [facility].  TE sent to Dr. Rush Landmark to see if he is ok for a direct to the hospital or would like an office visit first.  PV finished and pt is aware that I will contact her as soon as I hear from Dr. Rush Landmark and will mail her new prep instructions.  Understanding

## 2021-03-27 ENCOUNTER — Ambulatory Visit (INDEPENDENT_AMBULATORY_CARE_PROVIDER_SITE_OTHER): Payer: Medicare Other | Admitting: Gastroenterology

## 2021-03-27 ENCOUNTER — Other Ambulatory Visit (INDEPENDENT_AMBULATORY_CARE_PROVIDER_SITE_OTHER): Payer: Medicare Other

## 2021-03-27 ENCOUNTER — Encounter: Payer: Self-pay | Admitting: Gastroenterology

## 2021-03-27 VITALS — BP 140/100 | HR 70 | Ht 68.0 in | Wt 250.0 lb

## 2021-03-27 DIAGNOSIS — Z800 Family history of malignant neoplasm of digestive organs: Secondary | ICD-10-CM

## 2021-03-27 DIAGNOSIS — Z8619 Personal history of other infectious and parasitic diseases: Secondary | ICD-10-CM

## 2021-03-27 DIAGNOSIS — R6881 Early satiety: Secondary | ICD-10-CM | POA: Diagnosis not present

## 2021-03-27 DIAGNOSIS — Z86010 Personal history of colonic polyps: Secondary | ICD-10-CM

## 2021-03-27 DIAGNOSIS — G8220 Paraplegia, unspecified: Secondary | ICD-10-CM

## 2021-03-27 DIAGNOSIS — Z8719 Personal history of other diseases of the digestive system: Secondary | ICD-10-CM | POA: Diagnosis not present

## 2021-03-27 DIAGNOSIS — R630 Anorexia: Secondary | ICD-10-CM | POA: Diagnosis not present

## 2021-03-27 DIAGNOSIS — R634 Abnormal weight loss: Secondary | ICD-10-CM

## 2021-03-27 LAB — BASIC METABOLIC PANEL
BUN: 14 mg/dL (ref 6–23)
CO2: 24 mEq/L (ref 19–32)
Calcium: 9.4 mg/dL (ref 8.4–10.5)
Chloride: 103 mEq/L (ref 96–112)
Creatinine, Ser: 0.68 mg/dL (ref 0.40–1.20)
GFR: 90.27 mL/min (ref >60.00)
Glucose, Bld: 103 mg/dL — ABNORMAL HIGH (ref 70–99)
Potassium: 4 mEq/L (ref 3.5–5.1)
Sodium: 136 mEq/L (ref 135–145)

## 2021-03-27 LAB — CBC
HCT: 46.6 % — ABNORMAL HIGH (ref 36.0–46.0)
Hemoglobin: 15.4 g/dL — ABNORMAL HIGH (ref 12.0–15.0)
MCHC: 33.1 g/dL (ref 30.0–36.0)
MCV: 92.8 fl (ref 78.0–100.0)
Platelets: 210 10*3/uL (ref 150.0–400.0)
RBC: 5.02 Mil/uL (ref 3.87–5.11)
RDW: 13.1 % (ref 11.5–15.5)
WBC: 8.2 10*3/uL (ref 4.0–10.5)

## 2021-03-27 LAB — PROTIME-INR
INR: 1.1 ratio — ABNORMAL HIGH (ref 0.8–1.0)
Prothrombin Time: 11.6 s (ref 9.6–13.1)

## 2021-03-27 NOTE — Patient Instructions (Signed)
It has been recommended to you by your physician that you have a(n) Colon /Endo at the hospital  completed. Per your request, we did not schedule the procedure(s) today. Please contact Amira Podolak at [phone] once you have decided on a day to schedule. 05/04/21 or 05/11/21 at [facility].  Your provider has requested that you go to the basement level for lab work before leaving today. Press "B" on the elevator. The lab is located at the first door on the left as you exit the elevator.  If you are age 48 or older, your body mass index should be between 23-30. Your Body mass index is 38.01 kg/m. If this is out of the aforementioned range listed, please consider follow up with your Primary Care Provider.  __________________________________________________________  The [facility] providers would like to encourage you to use [facility] to communicate with providers for non-urgent requests or questions.  Due to long hold times on the telephone, sending your provider a message by [facility] may be a faster and more efficient way to get a response.  Please allow 48 business hours for a response.  Please remember that this is for non-urgent requests.   Thank you for choosing me and [facility].  Dr. Rush Landmark

## 2021-03-28 ENCOUNTER — Encounter: Payer: Self-pay | Admitting: Gastroenterology

## 2021-03-28 DIAGNOSIS — R6881 Early satiety: Secondary | ICD-10-CM | POA: Insufficient documentation

## 2021-03-28 DIAGNOSIS — Z8719 Personal history of other diseases of the digestive system: Secondary | ICD-10-CM | POA: Insufficient documentation

## 2021-03-28 DIAGNOSIS — R630 Anorexia: Secondary | ICD-10-CM | POA: Insufficient documentation

## 2021-03-28 DIAGNOSIS — Z86010 Personal history of colonic polyps: Secondary | ICD-10-CM | POA: Insufficient documentation

## 2021-03-28 DIAGNOSIS — R634 Abnormal weight loss: Secondary | ICD-10-CM | POA: Insufficient documentation

## 2021-03-28 DIAGNOSIS — G8220 Paraplegia, unspecified: Secondary | ICD-10-CM | POA: Insufficient documentation

## 2021-03-28 DIAGNOSIS — Z800 Family history of malignant neoplasm of digestive organs: Secondary | ICD-10-CM | POA: Insufficient documentation

## 2021-03-28 DIAGNOSIS — Z8619 Personal history of other infectious and parasitic diseases: Secondary | ICD-10-CM | POA: Insufficient documentation

## 2021-03-28 NOTE — Progress Notes (Signed)
[facility] VISIT   Primary Care Provider [name], MD [place] [address] [place] [address] [phone]  Referring Provider [name], MD [address] [place],  [place] [address] [phone]  Patient Profile: [name] is a 65 y.o. female with a pmh significant for postpolio syndrome and paralysis, CAD, status post TAVR, arthritis, allergies, MDD, anxiety, OSA, obesity, family history colon cancer (mother), diverticulosis with prior diverticulitis, possible GERD, prior H. pylori infection (unclear eradication or not).  The patient presents to the [facility] for an evaluation and management of problem(s) noted below:  Problem List 1. Early satiety   2. Anorexia   3. Unintentional weight loss   4. Hx of adenomatous colonic polyps   5. History of Helicobacter infection   6. History of diverticulitis   7. Flaccid paralysis of left leg   8. Family history of colon cancer in mother     History of Present Illness This is the patient's first visit to the outpatient [facility].  The patient has a history of adenomatous colon polyps.  She was brought up for direct procedure but due to her history and her partial paralysis, even though she is able to transfer and do all of her activities of daily living, she was not felt to be a candidate for [place] procedures and also wanted to discuss symptoms further to decide whether further colon cancer screening and colon polyp surveillance was indicated or necessary and that is the reason she is here today for her visit.  She has a history of prior diverticulitis.  She has a history of prior H. pylori infection as well though she feels better after her antibiotic therapy years ago she is not sure that she is ever been checked for eradication.  The patient has experienced over the course of the last few months some symptoms of early satiety and anorexia.   She is also had an unintentional weight loss of approximately 10 pounds of an unclear etiology.  She is thought that this has been post polio syndrome that has been causing her to have more fatigue and causing her not to want to eat but she is not completely sure.  She has been on PPI therapy since her diverticulitis flare per her report.  She currently does not experience GERD symptoms because she takes PPI and cannot remember the last time that she has had GERD symptoms.  She does recall at 1 episode of having dysphagia but that has not occurred for years.  She wonders though if she could come off the therapy or if she needs to remain on it lifelong.  The patient is relatively regular in regards to her bowel habits.  The patient does not take significant nonsteroidals or BC/Goody powders other than aspirin.  GI Review of Systems Positive as above Negative for current dysphagia, odynophagia, nausea, vomiting, pain, alteration of bowel habits, melena, hematochezia  Review of Systems General: Denies fevers/chills HEENT: Denies oral lesions Cardiovascular: Denies chest pain/palpitations Pulmonary: Denies shortness of breath Gastroenterological: See HPI Genitourinary: Denies darkened urine Hematological: Denies easy bruising/bleeding Dermatological: Denies jaundice Psychological: Mood is stable   Medications Current Outpatient Medications  Medication Sig Dispense Refill   ALPRAZolam (XANAX) 0.5 MG tablet Take 0.5 mg by mouth 2 (two) times daily as needed for sleep.      aspirin 81 MG chewable tablet Chew 1 tablet (81 mg total) by mouth daily.     aspirin  EC 81 MG tablet Take 81 mg by mouth daily. Swallow whole.     azithromycin (ZITHROMAX) 500 MG tablet Take 1 tablet (500 mg) one hour prior to all dental visits. 4 tablet 12   cetirizine (ZYRTEC) 10 MG tablet Take 10 mg by mouth as needed for allergies.     Cholecalciferol 25 MCG (1000 UT) capsule Take 2,000 Units by mouth daily.       clotrimazole-betamethasone (LOTRISONE) cream as needed.     docusate sodium (COLACE) 100 MG capsule Take 100 mg by mouth daily.     fluticasone (FLONASE) 50 MCG/ACT nasal spray Place 1 spray into both nostrils as needed for allergies or rhinitis.     gabapentin (NEURONTIN) 300 MG capsule Take 300 mg by mouth daily. Takes 200-300 mg     HYDROcodone-acetaminophen (NORCO/VICODIN) 5-325 MG tablet Take 1 tablet by mouth every 4 (four) hours as needed. 10 tablet 0   hydrocortisone 2.5 % cream as needed.     ketoconazole (NIZORAL) 2 % cream Apply topically as needed.     pantoprazole (PROTONIX) 40 MG tablet Take 40 mg by mouth daily.     predniSONE (DELTASONE) 20 MG tablet TAKE 3 TABLETS BY MOUTH ON DAY 1 THEN TAKE 2 TABLETS DAILY FOR 4 DAYS 11 tablet 0   traZODone (DESYREL) 100 MG tablet Take 100 mg by mouth at bedtime.      venlafaxine XR (EFFEXOR-XR) 75 MG 24 hr capsule Take 75 mg by mouth daily with breakfast.     vitamin B-12 (CYANOCOBALAMIN) 250 MCG tablet Take 250 mcg by mouth daily.     No current facility-administered medications for this visit.    Allergies Allergies  Allergen Reactions   Amoxicillin Anaphylaxis and Swelling    Did it involve swelling of the face/tongue/throat, SOB, or low BP? Yes Did it involve sudden or severe rash/hives, skin peeling, or any reaction on the inside of your mouth or nose? No Did you need to seek medical attention at a hospital or doctor's office? Yes When did it last happen?    yrs ago   If all above answers are "NO", may proceed with cephalosporin use.    Flagyl [Metronidazole] Anaphylaxis   Plaquenil [Hydroxychloroquine Sulfate] Other (See Comments)    Blurred vision, joint pain   Codeine Nausea Only   Doxycycline Nausea And Vomiting   Other Nausea Only    Tylenol with Codeine #3   Percocet [Oxycodone-Acetaminophen] Nausea Only   Sulfa Antibiotics Nausea Only    Histories Past Medical History:  Diagnosis Date   Allergy    seasonal  allergies   Anxiety    on meds   Arthritis    low back issues   Carpal tunnel syndrome    LEFT   Cataract    bilateral -sx   Cervical pain (neck)    Complication of anesthesia    DIFFICULTY WAKING UP   Coronary artery disease    Depression    on meds   Diverticulitis    GERD (gastroesophageal reflux disease)    on meds   Heart murmur 2020   post aortic valve replaced   Hyperlipidemia    diet controlled- not on meds at this time (08/05/2020)   Insomnia    Mass of right lower leg    Morbid obesity (HCC)    Neuromuscular disorder (HCC)    OSA on CPAP    uses CPAP nightly   Osteopenia    neck   PONV (postoperative nausea and  vomiting)    Post-polio syndrome    With left leg paralysis   S/P TAVR (transcatheter aortic valve replacement)    Severe aortic stenosis    Sigmoid diverticulosis    Sleep apnea    uses CPAP   Ulnar nerve abnormality    Past Surgical History:  Procedure Laterality Date   ANKLE FUSION     left   APPENDECTOMY     CHOLECYSTECTOMY     COLONOSCOPY     EYE SURGERY Bilateral 2020   cataracts removed August and September 2020   JOINT REPLACEMENT Right 2003   three replacements; 2003 is last one   KNEE ARTHROPLASTY     LEG SURGERY     muscle surgery related to polio   MASS EXCISION Right 01/08/2020   Procedure: EXCISION RIGHT LEG MASS;  Surgeon: Erroll Luna, MD;  Location: [facility];  Service: General;  Laterality: Right;   RECTAL PROLAPSE REPAIR  2020   pessary placed   RIGHT/LEFT HEART CATH AND CORONARY ANGIOGRAPHY N/A 04/02/2019   Procedure: RIGHT/LEFT HEART CATH AND CORONARY ANGIOGRAPHY;  Surgeon: Nelva Bush, MD;  Location: [facility];  Service: Cardiovascular;  Laterality: N/A;   TEE WITHOUT CARDIOVERSION N/A 04/17/2019   Procedure: TRANSESOPHAGEAL ECHOCARDIOGRAM (TEE);  Surgeon: Sherren Mocha, MD;  Location: [facility];  Service: Open Heart Surgery;  Laterality: N/A;   TOTAL HIP ARTHROPLASTY      right x 3   TRANSCATHETER AORTIC VALVE REPLACEMENT, TRANSFEMORAL N/A 04/17/2019   Procedure: TRANSCATHETER AORTIC VALVE REPLACEMENT, TRANSFEMORAL;  Surgeon: Sherren Mocha, MD;  Location: [facility];  Service: Open Heart Surgery;  Laterality: N/A;   UMBILICAL HERNIA REPAIR     Social History   Socioeconomic History   Marital status: Single    Spouse name: Not on file   Number of children: Not on file   Years of education: Not on file   Highest education level: Not on file  Occupational History   Not on file  Tobacco Use   Smoking status: Never   Smokeless tobacco: Never  Vaping Use   Vaping Use: Never used  Substance and Sexual Activity   Alcohol use: No   Drug use: Never   Sexual activity: Not on file  Other Topics Concern   Not on file  Social History Narrative   Lives alone.   Social Determinants of Health   Financial Resource Strain: Not on file  Food Insecurity: Not on file  Transportation Needs: Not on file  Physical Activity: Not on file  Stress: Not on file  Social Connections: Not on file  Intimate Partner Violence: Not on file   Family History  Problem Relation Age of Onset   Ovarian cancer Mother 78   Colon cancer Mother 30       mets from ovarian CA   Heart disease Father    CAD Other        FAM Hx OF CAD   Diabetes Other        FAM Hx of DM   Hypertension Other    Esophageal cancer Neg Hx    Rectal cancer Neg Hx    Stomach cancer Neg Hx    Inflammatory bowel disease Neg Hx    Liver disease Neg Hx    Pancreatic cancer Neg Hx    I have reviewed her medical, social, and family history in detail and updated the electronic medical record as necessary.    PHYSICAL EXAMINATION  BP (!) 140/100   Pulse 70   Ht 5\' 8"  (1.727 m)   Wt 250 lb (113.4 kg)   SpO2 98%   BMI 38.01 kg/m  Wt Readings from Last 3 Encounters:  03/27/21 250 lb (113.4 kg)  03/19/21 250 lb (113.4 kg)  01/19/21 250 lb (113.4 kg)  GEN: NAD, appears younger than stated  age, doesn't appear chronically ill, in wheelchair PSYCH: Cooperative, without pressured speech EYE: Conjunctivae pink, sclerae anicteric ENT: MMM CV: Nontachycardic RESP: No audible wheezing GI: NABS, soft, rounded, obese, NT, without rebound MSK/EXT: Lower extremity edema present with left leg in partial brace SKIN: No jaundice NEURO:  Alert & Oriented x 3   REVIEW OF DATA  I reviewed the following data at the time of this encounter:  GI Procedures and Studies  2017 colonoscopy Report not available but will obtain from [facility] Tubular adenoma and pathology noted in epic chart  Laboratory Studies  Reviewed those in Care Everywhere and epic  Imaging Studies  2020 CT renal stone protocol IMPRESSION: 1. No acute abdominal/pelvic findings, mass lesions or adenopathy. 2. No definite renal, ureteral or bladder calculi or mass. 3. Periumbilical abdominal wall hernia containing fat. 4. Small hiatal hernia. 5. Status post cholecystectomy.  No biliary dilatation. 6. Severe sigmoid colon diverticulosis but no definite findings for acute diverticulitis.   ASSESSMENT  Ms. Blackston is a 65 y.o. female with a pmh significant for postpolio syndrome and paralysis, CAD, status post TAVR, arthritis, allergies, MDD, anxiety, OSA, obesity, family history colon cancer (mother), diverticulosis with prior diverticulitis, possible GERD, prior H. pylori infection (unclear eradication or not).   The patient is seen today for evaluation and management of:  1. Early satiety   2. Anorexia   3. Unintentional weight loss   4. Hx of adenomatous colonic polyps   5. History of Helicobacter infection   6. History of diverticulitis   7. Flaccid paralysis of left leg   8. Family history of colon cancer in mother    The patient is hemodynamically and clinically stable.  The patient overall is in relatively decent health and has a good heart based on most recent echocardiogram.  She will benefit from  continued colon polyp surveillance and colon cancer screening as I expect her life expectancy to be greater than 10 years at this time.  She is partially ambulatory but is a not candidate for procedures at the [facility] after anesthesia evaluation.  The patient is not opposed to colonoscopy just that the preparation can be difficult for her but she is willing to move forward with that understanding her family history and her per her own personal history of colon polyps.  The patient has been experiencing newer symptoms of early satiety as well as anorexia and some unintentional weight loss.  She wonders if this is post polio syndrome but she does have a history of prior H. pylori infection.  We will add an upper endoscopy to the timing of her colonoscopy to further evaluate this.  We will consider additional imaging based on any further weight loss that she develops after endoscopic evaluation.  For now she remains current PPI dosing and depending on what we find will hopefully be able to decrease her PPI dosing if able or come off if there are no other indications based on the endoscopic findings.  The risks and benefits of endoscopic evaluation were discussed with the patient; these include but are not limited to the risk of perforation, infection, bleeding,  missed lesions, lack of diagnosis, severe illness requiring hospitalization, as well as anesthesia and sedation related illnesses.  The patient and/or family is agreeable to proceed.  All patient questions were answered to the best of my ability, and the patient agrees to the aforementioned plan of action with follow-up as indicated.   PLAN  Preprocedure hospital laboratories as outlined below Proceed with scheduling surveillance colonoscopy in hospital-based setting Proceed with scheduling diagnostic endoscopy at same time (HP biopsies to be obtained at minimum) Continue current PPI dosing   Orders Placed This Encounter  Procedures   CBC   Basic  Metabolic Panel (BMET)   INR/PT    New Prescriptions   No medications on file   Modified Medications   No medications on file    Planned Follow Up No follow-ups on file.   Total Time in Face-to-Face and in Coordination of Care for patient including independent/personal interpretation/review of prior testing, medical history, examination, medication adjustment, communicating results with the patient directly, and documentation within the EHR is 45 minutes.   Justice Britain, MD [facility] Office # [phone]

## 2021-03-30 ENCOUNTER — Telehealth: Payer: Self-pay | Admitting: Gastroenterology

## 2021-03-30 ENCOUNTER — Other Ambulatory Visit: Payer: Self-pay

## 2021-03-30 DIAGNOSIS — Z86010 Personal history of colonic polyps: Secondary | ICD-10-CM

## 2021-03-30 DIAGNOSIS — Z8619 Personal history of other infectious and parasitic diseases: Secondary | ICD-10-CM

## 2021-03-30 DIAGNOSIS — R6881 Early satiety: Secondary | ICD-10-CM

## 2021-03-30 DIAGNOSIS — R634 Abnormal weight loss: Secondary | ICD-10-CM

## 2021-03-30 DIAGNOSIS — R630 Anorexia: Secondary | ICD-10-CM

## 2021-03-30 NOTE — Telephone Encounter (Signed)
Patient has been scheduled for Monday 05/04/21 @ [facility] :10:00am for Proc Time of 11:30. Instructions have been sent to patient via mychart and by mail.

## 2021-03-30 NOTE — Telephone Encounter (Signed)
Patient called to let you know the 21st of November would be a good day for her to schedule the procedure.  If you have questions or need anything further, please give her a call.  She did say she was going to be out most of the day today.  Thank you.

## 2021-04-03 ENCOUNTER — Encounter: Payer: Medicare Other | Admitting: Gastroenterology

## 2021-04-13 DIAGNOSIS — G4700 Insomnia, unspecified: Secondary | ICD-10-CM | POA: Diagnosis not present

## 2021-04-13 DIAGNOSIS — M8580 Other specified disorders of bone density and structure, unspecified site: Secondary | ICD-10-CM | POA: Diagnosis not present

## 2021-04-13 DIAGNOSIS — E785 Hyperlipidemia, unspecified: Secondary | ICD-10-CM | POA: Diagnosis not present

## 2021-04-13 DIAGNOSIS — I509 Heart failure, unspecified: Secondary | ICD-10-CM | POA: Diagnosis not present

## 2021-04-13 DIAGNOSIS — K219 Gastro-esophageal reflux disease without esophagitis: Secondary | ICD-10-CM | POA: Diagnosis not present

## 2021-04-23 ENCOUNTER — Encounter (HOSPITAL_COMMUNITY): Payer: Self-pay | Admitting: Gastroenterology

## 2021-04-23 NOTE — Progress Notes (Signed)
Attempted to obtain medical history via telephone, unable to reach at this time. I left a voicemail to return pre surgical testing department's phone call.  

## 2021-04-24 ENCOUNTER — Encounter (HOSPITAL_COMMUNITY): Payer: Self-pay | Admitting: Gastroenterology

## 2021-04-27 DIAGNOSIS — H43813 Vitreous degeneration, bilateral: Secondary | ICD-10-CM | POA: Diagnosis not present

## 2021-04-27 DIAGNOSIS — H04123 Dry eye syndrome of bilateral lacrimal glands: Secondary | ICD-10-CM | POA: Diagnosis not present

## 2021-05-04 ENCOUNTER — Other Ambulatory Visit: Payer: Self-pay

## 2021-05-04 ENCOUNTER — Ambulatory Visit (HOSPITAL_COMMUNITY): Payer: Medicare Other | Admitting: Certified Registered Nurse Anesthetist

## 2021-05-04 ENCOUNTER — Ambulatory Visit (HOSPITAL_COMMUNITY)
Admission: RE | Admit: 2021-05-04 | Discharge: 2021-05-04 | Disposition: A | Discharge status: Home / Self Care | Payer: Medicare Other | Attending: Gastroenterology | Admitting: Gastroenterology

## 2021-05-04 ENCOUNTER — Encounter (HOSPITAL_COMMUNITY): Payer: Self-pay | Admitting: Gastroenterology

## 2021-05-04 ENCOUNTER — Encounter (HOSPITAL_COMMUNITY)
Admission: RE | Disposition: A | Discharge status: Home / Self Care | Payer: Self-pay | Source: Home / Self Care | Attending: Gastroenterology

## 2021-05-04 DIAGNOSIS — Z800 Family history of malignant neoplasm of digestive organs: Secondary | ICD-10-CM | POA: Diagnosis not present

## 2021-05-04 DIAGNOSIS — K644 Residual hemorrhoidal skin tags: Secondary | ICD-10-CM | POA: Insufficient documentation

## 2021-05-04 DIAGNOSIS — Z8619 Personal history of other infectious and parasitic diseases: Secondary | ICD-10-CM

## 2021-05-04 DIAGNOSIS — K5730 Diverticulosis of large intestine without perforation or abscess without bleeding: Secondary | ICD-10-CM | POA: Diagnosis not present

## 2021-05-04 DIAGNOSIS — R6881 Early satiety: Secondary | ICD-10-CM

## 2021-05-04 DIAGNOSIS — D120 Benign neoplasm of cecum: Secondary | ICD-10-CM | POA: Diagnosis not present

## 2021-05-04 DIAGNOSIS — K641 Second degree hemorrhoids: Secondary | ICD-10-CM | POA: Insufficient documentation

## 2021-05-04 DIAGNOSIS — K317 Polyp of stomach and duodenum: Secondary | ICD-10-CM | POA: Diagnosis not present

## 2021-05-04 DIAGNOSIS — R634 Abnormal weight loss: Secondary | ICD-10-CM

## 2021-05-04 DIAGNOSIS — R630 Anorexia: Secondary | ICD-10-CM

## 2021-05-04 DIAGNOSIS — Z86010 Personal history of colonic polyps: Secondary | ICD-10-CM

## 2021-05-04 DIAGNOSIS — K2950 Unspecified chronic gastritis without bleeding: Secondary | ICD-10-CM | POA: Diagnosis not present

## 2021-05-04 DIAGNOSIS — K449 Diaphragmatic hernia without obstruction or gangrene: Secondary | ICD-10-CM | POA: Diagnosis not present

## 2021-05-04 DIAGNOSIS — R12 Heartburn: Secondary | ICD-10-CM | POA: Diagnosis not present

## 2021-05-04 DIAGNOSIS — R1310 Dysphagia, unspecified: Secondary | ICD-10-CM | POA: Insufficient documentation

## 2021-05-04 DIAGNOSIS — K3189 Other diseases of stomach and duodenum: Secondary | ICD-10-CM

## 2021-05-04 DIAGNOSIS — R1084 Generalized abdominal pain: Secondary | ICD-10-CM | POA: Insufficient documentation

## 2021-05-04 DIAGNOSIS — Z1211 Encounter for screening for malignant neoplasm of colon: Secondary | ICD-10-CM | POA: Insufficient documentation

## 2021-05-04 DIAGNOSIS — K635 Polyp of colon: Secondary | ICD-10-CM | POA: Diagnosis not present

## 2021-05-04 HISTORY — PX: BIOPSY: SHX5522

## 2021-05-04 HISTORY — PX: ESOPHAGOGASTRODUODENOSCOPY (EGD) WITH PROPOFOL: SHX5813

## 2021-05-04 HISTORY — PX: POLYPECTOMY: SHX5525

## 2021-05-04 HISTORY — PX: COLONOSCOPY: SHX5424

## 2021-05-04 HISTORY — PX: SAVORY DILATION: SHX5439

## 2021-05-04 SURGERY — COLONOSCOPY
Anesthesia: Monitor Anesthesia Care

## 2021-05-04 MED ORDER — PROPOFOL 500 MG/50ML IV EMUL
INTRAVENOUS | Status: DC | PRN
  Administered 2021-05-04: 120 ug/kg/min via INTRAVENOUS

## 2021-05-04 MED ORDER — SODIUM CHLORIDE 0.9 % IV SOLN: INTRAVENOUS | Status: DC

## 2021-05-04 MED ORDER — LIDOCAINE 2% (20 MG/ML) 5 ML SYRINGE
INTRAMUSCULAR | Status: DC | PRN
  Administered 2021-05-04: 100 mg via INTRAVENOUS

## 2021-05-04 MED ORDER — LACTATED RINGERS IV SOLN: INTRAVENOUS | Status: DC

## 2021-05-04 SURGICAL SUPPLY — 15 items

## 2021-05-04 NOTE — H&P (Signed)
GASTROENTEROLOGY PROCEDURE H&P NOTE   Primary Care Physician: [name], MD  HPI: [name] is a 65 y.o. female who presents for EGD/colonoscopy for history of early satiety, anorexia, dysphagia, unintentional weight loss, prior H. pylori infection and family history of colon cancer.  Past Medical History:  Diagnosis Date   Allergy    seasonal allergies   Anxiety    on meds   Arthritis    low back issues   Carpal tunnel syndrome    LEFT   Cataract    bilateral -sx   Cervical pain (neck)    Complication of anesthesia    DIFFICULTY WAKING UP   Coronary artery disease    Depression    on meds   GERD (gastroesophageal reflux disease)    on meds   Hyperlipidemia    diet controlled- not on meds at this time (08/05/2020)   Insomnia    Mass of right lower leg    Morbid obesity (HCC)    OSA on CPAP    uses CPAP nightly   Osteopenia    neck   PONV (postoperative nausea and vomiting)    Post-polio syndrome    With left leg paralysis   S/P TAVR (transcatheter aortic valve replacement)    Sigmoid diverticulosis    Ulnar nerve abnormality    Past Surgical History:  Procedure Laterality Date   ANKLE FUSION     left   APPENDECTOMY     CHOLECYSTECTOMY     COLONOSCOPY     EYE SURGERY Bilateral 2020   cataracts removed August and September 2020   JOINT REPLACEMENT Right 2003   three replacements; 2003 is last one   KNEE ARTHROPLASTY     LEG SURGERY     muscle surgery related to polio   MASS EXCISION Right 01/08/2020   Procedure: EXCISION RIGHT LEG MASS;  Surgeon: Erroll Luna, MD;  Location: [facility];  Service: General;  Laterality: Right;   RECTAL PROLAPSE REPAIR  2020   pessary placed   RIGHT/LEFT HEART CATH AND CORONARY ANGIOGRAPHY N/A 04/02/2019   Procedure: RIGHT/LEFT HEART CATH AND CORONARY ANGIOGRAPHY;  Surgeon: Nelva Bush, MD;  Location: [facility];  Service: Cardiovascular;  Laterality: N/A;   TEE WITHOUT  CARDIOVERSION N/A 04/17/2019   Procedure: TRANSESOPHAGEAL ECHOCARDIOGRAM (TEE);  Surgeon: Sherren Mocha, MD;  Location: [facility];  Service: Open Heart Surgery;  Laterality: N/A;   TOTAL HIP ARTHROPLASTY     right x 3   TRANSCATHETER AORTIC VALVE REPLACEMENT, TRANSFEMORAL N/A 04/17/2019   Procedure: TRANSCATHETER AORTIC VALVE REPLACEMENT, TRANSFEMORAL;  Surgeon: Sherren Mocha, MD;  Location: [facility];  Service: Open Heart Surgery;  Laterality: N/A;   UMBILICAL HERNIA REPAIR     Current Facility-Administered Medications  Medication Dose Route Frequency Provider Last Rate Last Admin   0.9 %  sodium chloride infusion   Intravenous Continuous Mansouraty, Telford Nab., MD       lactated ringers infusion   Intravenous Continuous Mansouraty, Telford Nab., MD 20 mL/hr at 05/04/21 1053 New Bag at 05/04/21 1053    Current Facility-Administered Medications:    0.9 %  sodium chloride infusion, , Intravenous, Continuous, Mansouraty, Telford Nab., MD   lactated ringers infusion, , Intravenous, Continuous, Mansouraty, Telford Nab., MD, Last Rate: 20 mL/hr at 05/04/21 1053, New Bag at 05/04/21 1053 Allergies  Allergen Reactions   Amoxicillin Anaphylaxis and Swelling    Did it involve swelling of the face/tongue/throat, SOB, or low  BP? Yes Did it involve sudden or severe rash/hives, skin peeling, or any reaction on the inside of your mouth or nose? No Did you need to seek medical attention at a hospital or doctor's office? Yes When did it last happen?    yrs ago   If all above answers are "NO", may proceed with cephalosporin use.    Flagyl [Metronidazole] Anaphylaxis   Plaquenil [Hydroxychloroquine Sulfate] Other (See Comments)    Blurred vision, joint pain   Codeine Nausea Only   Doxycycline Nausea And Vomiting   Other Nausea Only    Tylenol with Codeine #3   Percocet [Oxycodone-Acetaminophen] Nausea Only   Sulfa Antibiotics Nausea Only   Family History  Problem Relation Age  of Onset   Ovarian cancer Mother 49   Colon cancer Mother 52       mets from ovarian CA   Heart disease Father    CAD Other        FAM Hx OF CAD   Diabetes Other        FAM Hx of DM   Hypertension Other    Esophageal cancer Neg Hx    Rectal cancer Neg Hx    Stomach cancer Neg Hx    Inflammatory bowel disease Neg Hx    Liver disease Neg Hx    Pancreatic cancer Neg Hx    Social History   Socioeconomic History   Marital status: Single    Spouse name: Not on file   Number of children: Not on file   Years of education: Not on file   Highest education level: Not on file  Occupational History   Not on file  Tobacco Use   Smoking status: Never   Smokeless tobacco: Never  Vaping Use   Vaping Use: Never used  Substance and Sexual Activity   Alcohol use: No   Drug use: Never   Sexual activity: Not on file  Other Topics Concern   Not on file  Social History Narrative   Lives alone.   Social Determinants of Health   Financial Resource Strain: Not on file  Food Insecurity: Not on file  Transportation Needs: Not on file  Physical Activity: Not on file  Stress: Not on file  Social Connections: Not on file  Intimate Partner Violence: Not on file    Physical Exam: Today's Vitals   04/24/21 1131 05/04/21 1015 05/04/21 1016  BP:   (!) 163/73  Pulse:   71  Resp:   18  Temp:   98.4 F (36.9 C)  TempSrc:  Oral   SpO2:   97%  Weight: 113.4 kg 113.4 kg   Height: 5\' 8"  (1.727 m) 5\' 8"  (1.727 m)   PainSc:  0-No pain    Body mass index is 38.01 kg/m. GEN: NAD EYE: Sclerae anicteric ENT: MMM CV: Non-tachycardic GI: Soft, NT/ND NEURO:  Alert & Oriented x 3  Lab Results: No results for input(s): WBC, HGB, HCT, PLT in the last 72 hours. BMET No results for input(s): NA, K, CL, CO2, GLUCOSE, BUN, CREATININE, CALCIUM in the last 72 hours. LFT No results for input(s): PROT, ALBUMIN, AST, ALT, ALKPHOS, BILITOT, BILIDIR, IBILI in the last 72 hours. PT/INR No results for  input(s): LABPROT, INR in the last 72 hours.   Impression / Plan: This is a 65 y.o.female who presents for EGD/colonoscopy for history of early satiety, anorexia, dysphagia, unintentional weight loss, prior H. pylori infection and family history of colon cancer.  The risks and  benefits of endoscopic evaluation/treatment were discussed with the patient and/or family; these include but are not limited to the risk of perforation, infection, bleeding, missed lesions, lack of diagnosis, severe illness requiring hospitalization, as well as anesthesia and sedation related illnesses.  The patient's history has been reviewed, patient examined, no change in status, and deemed stable for procedure.  The patient and/or family is agreeable to proceed.    Justice Britain, MD [facility] Office # [phone]

## 2021-05-04 NOTE — Anesthesia Postprocedure Evaluation (Signed)
Anesthesia Post Note  Patient: [name]  Procedure(s) Performed: COLONOSCOPY ESOPHAGOGASTRODUODENOSCOPY (EGD) WITH PROPOFOL BIOPSY SAVORY DILATION POLYPECTOMY     Patient location during evaluation: PACU Anesthesia Type: MAC Level of consciousness: awake and alert and oriented Pain management: pain level controlled Vital Signs Assessment: post-procedure vital signs reviewed and stable Respiratory status: spontaneous breathing, nonlabored ventilation and respiratory function stable Cardiovascular status: blood pressure returned to baseline Postop Assessment: no apparent nausea or vomiting Anesthetic complications: no   No notable events documented.  Last Vitals:  Vitals:   05/04/21 1226 05/04/21 1236  BP: 140/70 122/77  Pulse: 67 62  Resp: (!) 26 (!) 22  Temp:    SpO2: 97% 97%    Last Pain:  Vitals:   05/04/21 1236  TempSrc:   PainSc: 0-No pain                 Marthenia Rolling

## 2021-05-04 NOTE — Op Note (Addendum)
[facility] Patient Name: [name] Procedure Date: 05/04/2021 MRN: [id number] Attending MD: Justice Britain , MD Date of Birth: 03/20/1956 CSN: [id number] Age: 65 Admit Type: Outpatient Procedure:                Upper GI endoscopy Indications:              Generalized abdominal pain, Dysphagia, Heartburn Providers:                Justice Britain, MD, Jeanella Cara, RN,                            Cherylynn Ridges, Technician, Lodema Hong Technician,                            Technician Referring MD:             [name] Medicines:                Monitored Anesthesia Care Complications:            No immediate complications. Estimated Blood Loss:     Estimated blood loss was minimal. Procedure:                Pre-Anesthesia Assessment:                           - Prior to the procedure, a History and Physical                            was performed, and patient medications and                            allergies were reviewed. The patient's tolerance of                            previous anesthesia was also reviewed. The risks                            and benefits of the procedure and the sedation                            options and risks were discussed with the patient.                            All questions were answered, and informed consent                            was obtained. Prior Anticoagulants: The patient has                            taken no previous anticoagulant or antiplatelet                            agents except for aspirin. ASA Grade Assessment:                            III -  A patient with severe systemic disease. After                            reviewing the risks and benefits, the patient was                            deemed in satisfactory condition to undergo the                            procedure.                           After obtaining informed consent, the endoscope was                             passed under direct vision. Throughout the                            procedure, the patient's blood pressure, pulse, and                            oxygen saturations were monitored continuously. The                            [id number]-H190 ([id number]) Olympus endoscope was introduced                            through the mouth, and advanced to the second part                            of duodenum. The upper GI endoscopy was                            accomplished without difficulty. The patient                            tolerated the procedure. Scope In: Scope Out: Findings:      No gross lesions were noted in the entire esophagus. Biopsies were taken       with a cold forceps for histology to rule out EoE/LoE. After the rest of       the EGD was complete, a guidewire was placed and the scope was       withdrawn. Dilation was attempted, but the lesion was not amenable to       treatment with a Savary dilator due to severe resistance at 18 mm. I       then removed the wire and re-evaluate the area, decision made to pursue       smaller caliber Savary. A guidewire was replaced and the scope was       withdrawn. Dilation was performed with a Savary dilator with mild       resistance at 16 mm and moderate resistance at 17 mm. The dilation site       was examined following endoscope reinsertion and showed just below the       UES, a moderate mucosal disruption, moderate improvement in luminal  narrowing and no perforation.      The Z-line was regular and was found 35 cm from the incisors.      A 2 cm hiatal hernia was present.      Multiple 2 to 15 mm pedunculated and sessile polyps with no bleeding       were found in the entire examined stomach - appearance is most       consistent with fundic/inflammatory/hyperplastic polyps. Biopsies were       taken with a cold forceps for histology.      Patchy mildly erythematous mucosa without bleeding was found in the       entire examined  stomach. Biopsies were taken with a cold forceps for       histology and Helicobacter pylori testing.      No gross lesions were noted in the duodenal bulb, in the first portion       of the duodenum and in the second portion of the duodenum. Impression:               - No gross lesions in esophagus. Biopsied. Dilated                            up to 17 mm with appropriate mucosal wrent noted                            just below UES..                           - Z-line regular, 35 cm from the incisors.                           - 2 cm hiatal hernia.                           - Multiple gastric polyps - likely                            fundic/hyperplastic/inflammatory in appearance -                            biopsied.                           - Erythematous mucosa in the stomach. Biopsied.                           - No gross lesions in the duodenal bulb, in the                            first portion of the duodenum and in the second                            portion of the duodenum. Moderate Sedation:      Not Applicable - Patient had care per Anesthesia. Recommendation:           - Proceed to scheduled colonoscopy.                           -  Dilation diet as per protocol.                           - Please use Cepacol or Halls Lozenges +/-                            Chloraseptic spray for next 72-96 hours to aid in                            sore thoat should you experience this.                           - Continue present medications.                           - Await pathology results.                           - Will see in next 2-3 weeks how dilation has done.                            If issues of dysphagia recur in a long period of                            time then consider repeat EGD with dilation to 18                            mm +/- Manometry. If no significant benefit in                            short period, would then choose manometry first.                            - If evidence of IDA develops in the future,                            consider role of repeat EGD for removal of larger                            polyps.                           - The findings and recommendations were discussed                            with the patient.                           - The findings and recommendations were discussed                            with the patient's family. Procedure Code(s):        --- Professional ---  43248, Esophagogastroduodenoscopy, flexible,                            transoral; with insertion of guide wire followed by                            passage of dilator(s) through esophagus over guide                            wire Diagnosis Code(s):        --- Professional ---                           K44.9, Diaphragmatic hernia without obstruction or                            gangrene                           K31.89, Other diseases of stomach and duodenum                           R10.84, Generalized abdominal pain                           R13.10, Dysphagia, unspecified                           R12, Heartburn CPT copyright 2019 American Medical Association. All rights reserved. The codes documented in this report are preliminary and upon coder review may  be revised to meet current compliance requirements. Justice Britain, MD 05/04/2021 12:31:43 PM Number of Addenda: 0

## 2021-05-04 NOTE — Transfer of Care (Signed)
Immediate Anesthesia Transfer of Care Note  Patient: [name]  Procedure(s) Performed: COLONOSCOPY ESOPHAGOGASTRODUODENOSCOPY (EGD) WITH PROPOFOL BIOPSY SAVORY DILATION POLYPECTOMY  Patient Location: Endoscopy Unit  Anesthesia Type:MAC  Level of Consciousness: awake  Airway & Oxygen Therapy: Patient Spontanous Breathing and Patient connected to face mask oxygen  Post-op Assessment: Report given to RN and Post -op Vital signs reviewed and stable  Post vital signs: Reviewed and stable  Last Vitals:  Vitals Value Taken Time  BP 107/91 05/04/21 1216  Temp    Pulse 76 05/04/21 1218  Resp 19 05/04/21 1218  SpO2 100 % 05/04/21 1218  Vitals shown include unvalidated device data.  Last Pain:  Vitals:   05/04/21 1216  TempSrc:   PainSc: 0-No pain         Complications: No notable events documented.

## 2021-05-04 NOTE — Discharge Instructions (Signed)
YOU HAD AN ENDOSCOPIC PROCEDURE TODAY: Refer to the procedure report and other information in the discharge instructions given to you for any specific questions about what was found during the examination. If this information does not answer your questions, please call [facility] office at [phone] to clarify.  ° °YOU SHOULD EXPECT: Some feelings of bloating in the abdomen. Passage of more gas than usual. Walking can help get rid of the air that was put into your GI tract during the procedure and reduce the bloating. If you had a lower endoscopy (such as a colonoscopy or flexible sigmoidoscopy) you may notice spotting of blood in your stool or on the toilet paper. Some abdominal soreness may be present for a day or two, also. ° °DIET: Your first meal following the procedure should be a light meal and then it is ok to progress to your normal diet. A half-sandwich or bowl of soup is an example of a good first meal. Heavy or fried foods are harder to digest and may make you feel nauseous or bloated. Drink plenty of fluids but you should avoid alcoholic beverages for 24 hours. If you had a esophageal dilation, please see attached instructions for diet.   ° °ACTIVITY: Your care partner should take you home directly after the procedure. You should plan to take it easy, moving slowly for the rest of the day. You can resume normal activity the day after the procedure however YOU SHOULD NOT DRIVE, use power tools, machinery or perform tasks that involve climbing or major physical exertion for 24 hours (because of the sedation medicines used during the test).  ° °SYMPTOMS TO REPORT IMMEDIATELY: °A gastroenterologist can be reached at any hour. Please call [phone]  for any of the following symptoms:  °Following lower endoscopy (colonoscopy, flexible sigmoidoscopy) °Excessive amounts of blood in the stool  °Significant tenderness, worsening of abdominal pains  °Swelling of the abdomen that is new, acute  °Fever of 100° or  higher  °Following upper endoscopy (EGD, EUS, ERCP, esophageal dilation) °Vomiting of blood or coffee ground material  °New, significant abdominal pain  °New, significant chest pain or pain under the shoulder blades  °Painful or persistently difficult swallowing  °New shortness of breath  °Black, tarry-looking or red, bloody stools ° °FOLLOW UP:  °If any biopsies were taken you will be contacted by phone or by letter within the next 1-3 weeks. Call [phone]  if you have not heard about the biopsies in 3 weeks.  °Please also call with any specific questions about appointments or follow up tests. ° °

## 2021-05-04 NOTE — Anesthesia Preprocedure Evaluation (Signed)
Anesthesia Evaluation    Reviewed: Allergy & Precautions, Patient's Chart, lab work & pertinent test results  History of Anesthesia Complications (+) PONV and history of anesthetic complications  Airway Mallampati: II  TM Distance: >3 FB Neck ROM: Full    Dental no notable dental hx.    Pulmonary sleep apnea ,    Pulmonary exam normal        Cardiovascular + CAD and +CHF  Normal cardiovascular exam  S/P TAVR, normal EF   Neuro/Psych Anxiety Depression Postpolio syndrome, uses wheelchair      GI/Hepatic Neg liver ROS, GERD  Medicated and Controlled,  Endo/Other  negative endocrine ROS  Renal/GU negative Renal ROS  negative genitourinary   Musculoskeletal  (+) Arthritis ,   Abdominal   Peds  Hematology negative hematology ROS (+)   Anesthesia Other Findings Day of surgery medications reviewed with patient.  Reproductive/Obstetrics negative OB ROS                             Anesthesia Physical Anesthesia Plan  ASA: 3  Anesthesia Plan: MAC   Post-op Pain Management: Minimal or no pain anticipated   Induction:   PONV Risk Score and Plan: Treatment may vary due to age or medical condition and Propofol infusion  Airway Management Planned: Natural Airway and Nasal Cannula  Additional Equipment: None  Intra-op Plan:   Post-operative Plan:   Informed Consent: I have reviewed the patients History and Physical, chart, labs and discussed the procedure including the risks, benefits and alternatives for the proposed anesthesia with the patient or authorized representative who has indicated his/her understanding and acceptance.       Plan Discussed with: CRNA  Anesthesia Plan Comments:         Anesthesia Quick Evaluation

## 2021-05-04 NOTE — Op Note (Signed)
[facility] Patient Name: [name] Procedure Date: 05/04/2021 MRN: [id number] Attending MD: Corliss Parish , MD Date of Birth: 24-Nov-1955 CSN: [id number] Age: 65 Admit Type: Outpatient Procedure:                Colonoscopy Indications:              Screening in patient at increased risk: Family                            history of 1st-degree relative with colorectal                            cancer Providers:                Corliss Parish, MD, Margaree Mackintosh, RN,                            Beryle Beams, Technician, Alyce Pagan Technician,                            Technician Referring MD:             Farris Has Medicines:                Monitored Anesthesia Care Complications:            No immediate complications. Estimated Blood Loss:     Estimated blood loss was minimal. Procedure:                Pre-Anesthesia Assessment:                           - Prior to the procedure, a History and Physical                            was performed, and patient medications and                            allergies were reviewed. The patient's tolerance of                            previous anesthesia was also reviewed. The risks                            and benefits of the procedure and the sedation                            options and risks were discussed with the patient.                            All questions were answered, and informed consent                            was obtained. Prior Anticoagulants: The patient has                            taken no previous anticoagulant or antiplatelet  agents except for aspirin. ASA Grade Assessment:                            III - A patient with severe systemic disease. After                            reviewing the risks and benefits, the patient was                            deemed in satisfactory condition to undergo the                            procedure.                            After obtaining informed consent, the colonoscope                            was passed under direct vision. Throughout the                            procedure, the patient's blood pressure, pulse, and                            oxygen saturations were monitored continuously. The                            CF-HQ190L ([id number] Olympus colonoscope was                            introduced through the anus and advanced to the the                            cecum, identified by appendiceal orifice and                            ileocecal valve. The colonoscopy was somewhat                            difficult due to significant looping. Successful                            completion of the procedure was aided by changing                            the patient's position, using manual pressure,                            withdrawing and reinserting the scope,                            straightening and shortening the scope to obtain  bowel loop reduction and using scope torsion. The                            patient tolerated the procedure. The quality of the                            bowel preparation was adequate. The ileocecal                            valve, appendiceal orifice, and rectum were                            photographed. Scope In: 11:48:49 AM Scope Out: 12:10:20 PM Scope Withdrawal Time: 0 hours 15 minutes 48 seconds  Total Procedure Duration: 0 hours 21 minutes 31 seconds  Findings:      The digital rectal exam findings include hemorrhoids. Pertinent       negatives include no palpable rectal lesions.      A 5 mm polyp was found in the cecum. The polyp was sessile. The polyp       was removed with a cold snare. Resection and retrieval were complete.      Multiple small-mouthed diverticula were found in the entire colon.      Normal mucosa was found in the entire colon otherwise.      Non-bleeding non-thrombosed external  and internal hemorrhoids were found       during retroflexion, during perianal exam and during digital exam. The       hemorrhoids were Grade II (internal hemorrhoids that prolapse but reduce       spontaneously). Impression:               - Hemorrhoids found on digital rectal exam.                           - One 5 mm polyp in the cecum, removed with a cold                            snare. Resected and retrieved.                           - Diverticulosis in the entire examined colon.                           - Normal mucosa in the entire examined colon                            otherwise.                           - Non-bleeding non-thrombosed external and internal                            hemorrhoids. Moderate Sedation:      Not Applicable - Patient had care per Anesthesia. Recommendation:           - The patient will be observed post-procedure,  until all discharge criteria are met.                           - Discharge patient to home.                           - Patient has a contact number available for                            emergencies. The signs and symptoms of potential                            delayed complications were discussed with the                            patient. Return to normal activities tomorrow.                            Written discharge instructions were provided to the                            patient.                           - High fiber diet.                           - Use FiberCon 1-2 tablets PO daily.                           - Continue present medications.                           - Await pathology results.                           - Repeat colonoscopy in 5 years for surveillance                            would be recommended on patient's family history.                            With this being said, depending on the patient's                            clinical status would need to discuss prior to any                             scheduling.                           - The findings and recommendations were discussed                            with the patient.                           -  The findings and recommendations were discussed                            with the patient's family. Procedure Code(s):        --- Professional ---                           [phone], Colonoscopy, flexible; with removal of                            tumor(s), polyp(s), or other lesion(s) by snare                            technique Diagnosis Code(s):        --- Professional ---                           Z80.0, Family history of malignant neoplasm of                            digestive organs                           K64.1, Second degree hemorrhoids                           K63.5, Polyp of colon                           K57.30, Diverticulosis of large intestine without                            perforation or abscess without bleeding CPT copyright 2019 American Medical Association. All rights reserved. The codes documented in this report are preliminary and upon coder review may  be revised to meet current compliance requirements. Justice Britain, MD 05/04/2021 12:38:42 PM Number of Addenda: 0

## 2021-05-05 LAB — SURGICAL PATHOLOGY

## 2021-05-06 ENCOUNTER — Encounter (HOSPITAL_COMMUNITY): Payer: Self-pay | Admitting: Gastroenterology

## 2021-05-11 ENCOUNTER — Encounter: Payer: Self-pay | Admitting: Gastroenterology

## 2021-06-08 DIAGNOSIS — G4733 Obstructive sleep apnea (adult) (pediatric): Secondary | ICD-10-CM | POA: Diagnosis not present

## 2021-06-15 DIAGNOSIS — L821 Other seborrheic keratosis: Secondary | ICD-10-CM | POA: Diagnosis not present

## 2021-06-15 DIAGNOSIS — L309 Dermatitis, unspecified: Secondary | ICD-10-CM | POA: Diagnosis not present

## 2021-06-15 DIAGNOSIS — B353 Tinea pedis: Secondary | ICD-10-CM | POA: Diagnosis not present

## 2021-07-03 DIAGNOSIS — H43813 Vitreous degeneration, bilateral: Secondary | ICD-10-CM | POA: Diagnosis not present

## 2021-07-06 DIAGNOSIS — N8182 Incompetence or weakening of pubocervical tissue: Secondary | ICD-10-CM | POA: Diagnosis not present

## 2021-07-08 DIAGNOSIS — G14 Postpolio syndrome: Secondary | ICD-10-CM | POA: Diagnosis not present

## 2021-07-08 DIAGNOSIS — R5383 Other fatigue: Secondary | ICD-10-CM | POA: Diagnosis not present

## 2021-07-08 DIAGNOSIS — M8580 Other specified disorders of bone density and structure, unspecified site: Secondary | ICD-10-CM | POA: Diagnosis not present

## 2021-07-08 DIAGNOSIS — G8104 Flaccid hemiplegia affecting left nondominant side: Secondary | ICD-10-CM | POA: Diagnosis not present

## 2021-07-08 DIAGNOSIS — R7309 Other abnormal glucose: Secondary | ICD-10-CM | POA: Diagnosis not present

## 2021-07-08 DIAGNOSIS — R202 Paresthesia of skin: Secondary | ICD-10-CM | POA: Diagnosis not present

## 2021-07-10 DIAGNOSIS — E785 Hyperlipidemia, unspecified: Secondary | ICD-10-CM | POA: Diagnosis not present

## 2021-07-10 DIAGNOSIS — I509 Heart failure, unspecified: Secondary | ICD-10-CM | POA: Diagnosis not present

## 2021-07-10 DIAGNOSIS — K219 Gastro-esophageal reflux disease without esophagitis: Secondary | ICD-10-CM | POA: Diagnosis not present

## 2021-07-10 DIAGNOSIS — G4700 Insomnia, unspecified: Secondary | ICD-10-CM | POA: Diagnosis not present

## 2021-08-04 DIAGNOSIS — H35373 Puckering of macula, bilateral: Secondary | ICD-10-CM | POA: Diagnosis not present

## 2021-08-04 DIAGNOSIS — H43393 Other vitreous opacities, bilateral: Secondary | ICD-10-CM | POA: Diagnosis not present

## 2021-08-04 DIAGNOSIS — H43813 Vitreous degeneration, bilateral: Secondary | ICD-10-CM | POA: Diagnosis not present

## 2021-08-04 DIAGNOSIS — H26492 Other secondary cataract, left eye: Secondary | ICD-10-CM | POA: Diagnosis not present

## 2021-08-17 ENCOUNTER — Telehealth: Payer: Self-pay

## 2021-08-17 NOTE — Telephone Encounter (Signed)
Debra Milo "Ann" is requesting cardiac clearance for vitrectomy.  She was last seen in the clinic on 01/19/2021.  During that time she remained stable from a cardiac standpoint.  Her CPAP device/machine was on backorder.  She was instructed to monitor her blood pressure.  Follow-up was planned for 12 months. ? ? ?Her PMH includes severe AS status post TAVR, chronic diastolic CHF, OSA, and HTN. ? ?May her aspirin be held prior to her procedure? ? ?Thank you for your help.  Please direct your response to [facility] preop pool. ? ?Jossie Ng. Barry Faircloth NP-C ? ?  ?08/17/2021, 3:59 PM ?[place] ?[address] ?Office [phone] Fax [phone] ? ?

## 2021-08-17 NOTE — Telephone Encounter (Signed)
? ?  Pre-operative Risk Assessment  ?  ?Patient Name: [name]  ?DOB: May 16, 1956 ?MRN: [id number]  ? ?  ? ?Request for Surgical Clearance   ? ?Procedure:   VITRECTOMY SURGERY ? ?Date of Surgery:  Clearance 09/07/21                              ?   ?Surgeon:  DR. Sherlynn Stalls ?Surgeon's Group or Practice Name:  [facility] ?Phone number:  [phone] ?Fax number:  [phone] ?  ?Type of Clearance Requested:   ?- Medical  ?- Pharmacy:  Hold Aspirin INSTRUCTIONS ON HOW LONG TO HOLD ?  ?Type of Anesthesia:  MAC ?  ?Additional requests/questions:   ? ?Signed, ?Jacinta Shoe   ?08/17/2021, 2:59 PM  ? ?

## 2021-08-18 NOTE — Telephone Encounter (Signed)
? ?  Primary Cardiologist: Minus Breeding, MD ? ?Chart reviewed as part of pre-operative protocol coverage. Given past medical history and time since last visit, based on ACC/AHA guidelines, [name] would be at acceptable risk for the planned procedure without further cardiovascular testing.  ? ?Her aspirin may be held prior to her procedure.  Please hold aspirin for the shortest amount of time possible.  Please resume as soon as hemostasis is achieved.   ? ?I will route this recommendation to the requesting party via Epic fax function and remove from pre-op pool. ? ?Please call with questions. ? ?Jossie Ng. Zalmen Wrightsman NP-C ? ?  ?08/18/2021, 12:24 PM ?[place] ?[address] ?Office [phone] Fax [phone] ? ? ? ? ?

## 2021-09-02 DIAGNOSIS — R7309 Other abnormal glucose: Secondary | ICD-10-CM | POA: Diagnosis not present

## 2021-09-02 DIAGNOSIS — G14 Postpolio syndrome: Secondary | ICD-10-CM | POA: Diagnosis not present

## 2021-09-02 DIAGNOSIS — R5383 Other fatigue: Secondary | ICD-10-CM | POA: Diagnosis not present

## 2021-09-02 DIAGNOSIS — R202 Paresthesia of skin: Secondary | ICD-10-CM | POA: Diagnosis not present

## 2021-09-02 DIAGNOSIS — G8104 Flaccid hemiplegia affecting left nondominant side: Secondary | ICD-10-CM | POA: Diagnosis not present

## 2021-09-07 DIAGNOSIS — H26491 Other secondary cataract, right eye: Secondary | ICD-10-CM | POA: Diagnosis not present

## 2021-09-07 DIAGNOSIS — H43391 Other vitreous opacities, right eye: Secondary | ICD-10-CM | POA: Diagnosis not present

## 2021-09-29 DIAGNOSIS — N8189 Other female genital prolapse: Secondary | ICD-10-CM | POA: Diagnosis not present

## 2021-10-01 DIAGNOSIS — G8104 Flaccid hemiplegia affecting left nondominant side: Secondary | ICD-10-CM | POA: Diagnosis not present

## 2021-10-01 DIAGNOSIS — G14 Postpolio syndrome: Secondary | ICD-10-CM | POA: Diagnosis not present

## 2021-10-06 DIAGNOSIS — H43812 Vitreous degeneration, left eye: Secondary | ICD-10-CM | POA: Diagnosis not present

## 2021-11-04 DIAGNOSIS — Z96641 Presence of right artificial hip joint: Secondary | ICD-10-CM | POA: Diagnosis not present

## 2021-11-05 DIAGNOSIS — K219 Gastro-esophageal reflux disease without esophagitis: Secondary | ICD-10-CM | POA: Diagnosis not present

## 2021-11-05 DIAGNOSIS — J309 Allergic rhinitis, unspecified: Secondary | ICD-10-CM | POA: Diagnosis not present

## 2021-11-05 DIAGNOSIS — E785 Hyperlipidemia, unspecified: Secondary | ICD-10-CM | POA: Diagnosis not present

## 2021-11-05 DIAGNOSIS — I509 Heart failure, unspecified: Secondary | ICD-10-CM | POA: Diagnosis not present

## 2021-11-05 DIAGNOSIS — G4700 Insomnia, unspecified: Secondary | ICD-10-CM | POA: Diagnosis not present

## 2021-11-11 DIAGNOSIS — M205X2 Other deformities of toe(s) (acquired), left foot: Secondary | ICD-10-CM | POA: Diagnosis not present

## 2021-11-11 DIAGNOSIS — M79672 Pain in left foot: Secondary | ICD-10-CM | POA: Diagnosis not present

## 2021-11-30 DIAGNOSIS — M2042 Other hammer toe(s) (acquired), left foot: Secondary | ICD-10-CM | POA: Diagnosis not present

## 2021-12-01 ENCOUNTER — Telehealth: Payer: Self-pay

## 2021-12-01 NOTE — Telephone Encounter (Signed)
   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 Wylene Simmer Surgeon's Group or Practice Name:  [facility] Phone number:  [phone] Fax number:  [phone] Kerri Maze   Type of Clearance Requested:   - Medical    Type of Anesthesia:  Not Indicated   Additional requests/questions:    SignedWonda Horner   12/01/2021, 8:59 AM

## 2021-12-01 NOTE — Telephone Encounter (Signed)
   Name: [name]  DOB: 03/02/1956  MRN: [id number]  Primary Cardiologist: Minus Breeding, MD  Chart reviewed as part of pre-operative protocol coverage. Because of Anahy A Venneman's past medical history and time since last visit, she will require a follow-up in-office visit in order to better assess preoperative cardiovascular risk.  In a wheelchair.  Pre-op covering staff: - Please schedule appointment and call patient to inform them. If patient already had an upcoming appointment within acceptable timeframe, please add "pre-op clearance" to the appointment notes so provider is aware. - Please contact requesting surgeon's office via preferred method (i.e, phone, fax) to inform them of need for appointment prior to surgery.    Tami Lin Stori Royse, PA  12/01/2021, 12:12 PM

## 2021-12-02 ENCOUNTER — Ambulatory Visit: Payer: Medicare Other | Admitting: Gastroenterology

## 2021-12-02 NOTE — Telephone Encounter (Signed)
Pt has been scheduled to see Doreene Adas, PA-C, 12/21/21, clearance will be addressed at that time.  Will route back to the requesting surgeon's office to make them aware.

## 2021-12-07 DIAGNOSIS — H43392 Other vitreous opacities, left eye: Secondary | ICD-10-CM | POA: Diagnosis not present

## 2021-12-07 DIAGNOSIS — H26492 Other secondary cataract, left eye: Secondary | ICD-10-CM | POA: Diagnosis not present

## 2021-12-20 NOTE — Progress Notes (Addendum)
Cardiology Office Note:    Date:  12/21/2021   ID:  [name], DOB June 08, 1956, MRN [id number]  PCP:  [name], MD   [facility] Cardiologist:  Minus Breeding, MD Cardiology APP:  Ledora Bottcher, [place] { Referring MD: [name], MD   Chief Complaint  Patient presents with   Pre-op Exam    AS s/p TAVR    History of Present Illness:    [name] is a 66 y.o. 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 Pre-TAVR heart catheterization with normal coronary arteries. She was last seen by [facility] 03/2020 with stable valve function. Of note, she had a hospitalization 04/2019 for severe back and chest pain.  She was seen in clinic with Dr. Percival Spanish 01/2019 and was awaiting a new CPAP as her machine was recalled.    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 She presents in a wheelchair, as usual. She is back on CPAP. She did well after vitrectomy and conscious sedation. She reports that her amputation will be done with only local anesthetic.    Past Medical History:  Diagnosis Date   Allergy    seasonal allergies   Anxiety    on meds   Arthritis    low back issues   Carpal tunnel syndrome    LEFT   Cataract    bilateral -sx   Cervical pain (neck)    Complication of anesthesia    DIFFICULTY WAKING UP   Coronary artery disease    Depression    on meds   GERD (gastroesophageal reflux disease)    on meds   Hyperlipidemia    diet controlled- not on meds at this time (08/05/2020)   Insomnia    Mass of right lower leg    Morbid obesity (HCC)    OSA on CPAP    uses CPAP nightly   Osteopenia    neck   PONV (postoperative nausea and vomiting)    Post-polio syndrome    With left leg paralysis   S/P TAVR (transcatheter aortic  valve replacement)    Sigmoid diverticulosis    Ulnar nerve abnormality     Past Surgical History:  Procedure Laterality Date   ANKLE FUSION     left   APPENDECTOMY     BIOPSY  05/04/2021   Procedure: BIOPSY;  Surgeon: Irving Copas., MD;  Location: [facility];  Service: Gastroenterology;;   CHOLECYSTECTOMY     COLONOSCOPY     COLONOSCOPY N/A 05/04/2021   Procedure: COLONOSCOPY;  Surgeon: Irving Copas., MD;  Location: [facility];  Service: Gastroenterology;  Laterality: N/A;   ESOPHAGOGASTRODUODENOSCOPY (EGD) WITH PROPOFOL N/A 05/04/2021   Procedure: ESOPHAGOGASTRODUODENOSCOPY (EGD) WITH PROPOFOL;  Surgeon: Rush Landmark Telford Nab., MD;  Location: [facility];  Service: Gastroenterology;  Laterality: N/A;   EYE SURGERY Bilateral 2020   cataracts removed August and September 2020   JOINT REPLACEMENT Right 2003   three replacements; 2003 is last one   KNEE ARTHROPLASTY     LEG SURGERY     muscle surgery related to polio   MASS EXCISION Right 01/08/2020   Procedure: EXCISION RIGHT LEG MASS;  Surgeon: Erroll Luna, MD;  Location: [facility];  Service: General;  Laterality: Right;   POLYPECTOMY  05/04/2021   Procedure: POLYPECTOMY;  Surgeon: Irving Copas., MD;  Location: [facility];  Service: Gastroenterology;;   RECTAL PROLAPSE REPAIR  2020   pessary placed   RIGHT/LEFT HEART CATH AND CORONARY ANGIOGRAPHY N/A 04/02/2019   Procedure: RIGHT/LEFT HEART CATH AND CORONARY ANGIOGRAPHY;  Surgeon: Nelva Bush, MD;  Location: [place] [facility];  Service: Cardiovascular;  Laterality: N/A;   SAVORY DILATION N/A 05/04/2021   Procedure: SAVORY DILATION;  Surgeon: Rush Landmark Telford Nab., MD;  Location: [facility];  Service: Gastroenterology;  Laterality: N/A;   TEE WITHOUT CARDIOVERSION N/A 04/17/2019   Procedure: TRANSESOPHAGEAL ECHOCARDIOGRAM (TEE);  Surgeon: Sherren Mocha, MD;  Location: [place] [facility];  Service: Open Heart  Surgery;  Laterality: N/A;   TOTAL HIP ARTHROPLASTY     right x 3   TRANSCATHETER AORTIC VALVE REPLACEMENT, TRANSFEMORAL N/A 04/17/2019   Procedure: TRANSCATHETER AORTIC VALVE REPLACEMENT, TRANSFEMORAL;  Surgeon: Sherren Mocha, MD;  Location: [place] [facility];  Service: Open Heart Surgery;  Laterality: N/A;   UMBILICAL HERNIA REPAIR      Current Medications: Current Meds  Medication Sig   ALPRAZolam (XANAX) 0.5 MG tablet Take 0.5 mg by mouth 2 (two) times daily as needed for sleep.    aspirin EC 81 MG tablet Take 81 mg by mouth daily. Swallow whole.   azithromycin (ZITHROMAX) 500 MG tablet Take 1 tablet (500 mg) one hour prior to all dental visits.   cetirizine (ZYRTEC) 10 MG tablet Take 10 mg by mouth as needed for allergies.   Cholecalciferol 50 MCG (2000 UT) TABS Take 2,000 Units by mouth daily.    docusate sodium (COLACE) 100 MG capsule Take 200 mg by mouth daily.   fluticasone (FLONASE) 50 MCG/ACT nasal spray Place 1 spray into both nostrils as needed for allergies or rhinitis.   gabapentin (NEURONTIN) 300 MG capsule Take 300 mg by mouth 2 (two) times daily.   Loteprednol Etabonate (EYSUVIS) 0.25 % SUSP Place 1 drop into both eyes 3 (three) times a week.   pantoprazole (PROTONIX) 40 MG tablet Take 40 mg by mouth daily.   Propylene Glycol (SYSTANE BALANCE) 0.6 % SOLN Place 1 drop into both eyes in the morning, at noon, in the evening, and at bedtime.   rosuvastatin (CRESTOR) 20 MG tablet Take 1 tablet (20 mg total) by mouth daily.   traZODone (DESYREL) 150 MG tablet Take 150 mg by mouth at bedtime.   venlafaxine XR (EFFEXOR-XR) 75 MG 24 hr capsule Take 75 mg by mouth daily with breakfast.   vitamin B-12 (CYANOCOBALAMIN) 250 MCG tablet Take 250 mcg by mouth daily.   [DISCONTINUED] HYDROcodone-acetaminophen (NORCO/VICODIN) 5-325 MG tablet Take 1 tablet by mouth every 4 (four) hours as needed.     Allergies:   Amoxicillin, Flagyl [metronidazole], Plaquenil [hydroxychloroquine  sulfate], Codeine, Doxycycline, Other, Percocet [oxycodone-acetaminophen], and Sulfa antibiotics   Social History   Socioeconomic History   Marital status: Single    Spouse name: Not on file   Number of children: Not on file   Years of education: Not on file   Highest education level: Not on file  Occupational History   Not on file  Tobacco Use   Smoking status: Never   Smokeless tobacco: Never  Vaping Use   Vaping Use: Never used  Substance and Sexual Activity   Alcohol use: No   Drug use: Never   Sexual activity: Not on file  Other Topics Concern   Not on file  Social History Narrative   Lives alone.   Social Determinants of  Health   Financial Resource Strain: Not on file  Food Insecurity: Not on file  Transportation Needs: Not on file  Physical Activity: Not on file  Stress: Not on file  Social Connections: Not on file     Family History: The patient's family history includes CAD in an other family member; Colon cancer (age of onset: 84) in her mother; Diabetes in an other family member; Heart disease in her father; Hypertension in an other family member; Ovarian cancer (age of onset: 2) in her mother. There is no history of Esophageal cancer, Rectal cancer, Stomach cancer, Inflammatory bowel disease, Liver disease, or Pancreatic cancer.  ROS:   Please see the history of present illness.     All other systems reviewed and are negative.  EKGs/Labs/Other Studies Reviewed:    The following studies were reviewed today:  Echo 03/19/2020:  1. 26 mm S3. V max 2.0 m/s, MG 9 mmHG, EOA 1.59 cm2, DI 0.46. No PVL.  Normal prosthesis. The aortic valve has been repaired/replaced. Aortic  valve regurgitation is not visualized. There is a 26 mm Sapien prosthetic  (TAVR) valve present in the aortic  position. Procedure Date: 04/17/2019.   2. Left ventricular ejection fraction, by estimation, is 60 to 65%. The  left ventricle has normal function. The left ventricle has no  regional  wall motion abnormalities. Left ventricular diastolic parameters are  consistent with Grade I diastolic  dysfunction (impaired relaxation).   3. Right ventricular systolic function is normal. The right ventricular  size is normal. Tricuspid regurgitation signal is inadequate for assessing  PA pressure.   4. The mitral valve is grossly normal. No evidence of mitral valve  regurgitation. No evidence of mitral stenosis.   5. The inferior vena cava is normal in size with greater than 50%  respiratory variability, suggesting right atrial pressure of 3 mmHg.    Left and right heart cath 04/02/19 Conclusions: No angiographically significant CAD. Severe aortic stenosis (mean gradient 65 mmHg, calculated valve area 0.5 cm^2). Moderately to severely elevated left ventricular filling pressure. Mildly elevated right and pulmonary artery pressures. Moderately reduced cardiac output.   Recommendations: Medical therapy, including careful blood pressure control and diuresis. Valve team consultation for further workup/management of severe aortic stenosis.  EKG:  EKG is  ordered today.  The ekg ordered today demonstrates sinus rhythm HR 71  Recent Labs: 03/27/2021: BUN 14; Creatinine, Ser 0.68; Hemoglobin 15.4; Platelets 210.0; Potassium 4.0; Sodium 136  Recent Lipid Panel    Component Value Date/Time   CHOL 201 (H) 03/31/2019 0607   TRIG 93 03/31/2019 0607   HDL 44 03/31/2019 0607   CHOLHDL 4.6 03/31/2019 0607   VLDL 19 03/31/2019 0607   LDLCALC 138 (H) 03/31/2019 0607     Risk Assessment/Calculations:           Physical Exam:    VS:  BP 132/78   Pulse 71   Ht '5\' 8"'$  (1.727 m)   Wt 250 lb (113.4 kg)   SpO2 96%   BMI 38.01 kg/m     Wt Readings from Last 3 Encounters:  12/21/21 250 lb (113.4 kg)  05/04/21 250 lb (113.4 kg)  03/27/21 250 lb (113.4 kg)     GEN: obese female in a wheelchair HEENT: Normal NECK: No JVD; No carotid bruits LYMPHATICS: No  lymphadenopathy CARDIAC: RRR, no murmurs, rubs, gallops RESPIRATORY:  Clear to auscultation without rales, wheezing or rhonchi  ABDOMEN: Soft, non-tender, non-distended MUSCULOSKELETAL:  No edema; No deformity  SKIN:  Warm and dry NEUROLOGIC:  Alert and oriented x 3 PSYCHIATRIC:  Normal affect   ASSESSMENT:    1. Hyperlipidemia LDL goal <70   2. Preop cardiovascular exam   3. S/P TAVR (transcatheter aortic valve replacement)   4. Preoperative cardiovascular examination   5. Hyperlipidemia, unspecified hyperlipidemia type   6. OSA on CPAP    PLAN:    In order of problems listed above:  AS s/p TAVR Last echo in 2021 Given that she is sedentary, will repeat this echo   OSA on CPAP Compliant    Hyperlipidemia with LDL goal < 70 Lipid panel 02/2021 Total chol 219 HDL 56 LDL 141 Trig 123 Would at least get her LDL lower than 100 Will start 20 mg crestor three times weekly then daily   Preoperative risk evaluation for MACE for 2 toe amputations for hammer toe Heart catheterization 2020 prior to TAVR showed normal coronaries. Good valve function on echo 03/2020. She is permanently in a wheelchair, exertional symptoms not assessed. Will repeat an echocardiogram to check valve function for risk stratification. She states she will not have general anesthesia, although this is not indicated on preop request.   If echo shows good valve function, she may proceed with surgery.   ADDENDUM: Echocardiogram shows good valve function and normal LVEF. May proceed with surgery.  Dr. Doran Durand, [facility] Fax [phone]            Medication Adjustments/Labs and Tests Ordered: Current medicines are reviewed at length with the patient today.  Concerns regarding medicines are outlined above.  Orders Placed This Encounter  Procedures   EKG 12-Lead   ECHOCARDIOGRAM COMPLETE   Meds ordered this encounter  Medications   rosuvastatin (CRESTOR) 20 MG tablet    Sig: Take 1 tablet  (20 mg total) by mouth daily.    Dispense:  90 tablet    Refill:  3    Patient Instructions  Medication Instructions:  Start Crestor 20 mg ( As Directed). *If you need a refill on your cardiac medications before your next appointment, please call your pharmacy*   Lab Work: No Labs If you have labs (blood work) drawn today and your tests are completely normal, you will receive your results only by: [place] (if you have MyChart) OR A paper copy in the mail If you have any lab test that is abnormal or we need to change your treatment, we will call you to review the results.   Testing/Procedures: [address]. Your physician has requested that you have an echocardiogram. Echocardiography is a painless test that uses sound waves to create images of your heart. It provides your doctor with information about the size and shape of your heart and how well your heart's chambers and valves are working. This procedure takes approximately one hour. There are no restrictions for this procedure.    Follow-Up: At [facility], you and your health needs are our priority.  As part of our continuing mission to provide you with exceptional heart care, we have created designated Provider Care Teams.  These Care Teams include your primary Cardiologist (physician) and Advanced Practice Providers (APPs -  Physician Assistants and Nurse Practitioners) who all work together to provide you with the care you need, when you need it.  We recommend signing up for the patient portal called "MyChart".  Sign up information is provided on this After Visit Summary.  MyChart is used to connect with patients for Virtual Visits (Telemedicine).  Patients are able to view lab/test results, encounter notes, upcoming appointments, etc.  Non-urgent messages can be sent to your provider as well.   To learn more about what you can do with MyChart, go to [email or web address].    Your next  appointment:   1 year(s)  The format for your next appointment:   In Person  Provider:   Minus Breeding, MD       Important Information About Sugar         Signed, Ledora Bottcher, [place]  12/21/2021 3:18 PM    [place]

## 2021-12-21 ENCOUNTER — Encounter: Payer: Self-pay | Admitting: Physician Assistant

## 2021-12-21 ENCOUNTER — Ambulatory Visit (INDEPENDENT_AMBULATORY_CARE_PROVIDER_SITE_OTHER): Payer: Medicare HMO | Admitting: Physician Assistant

## 2021-12-21 VITALS — BP 132/78 | HR 71 | Ht 68.0 in | Wt 250.0 lb

## 2021-12-21 DIAGNOSIS — Z9989 Dependence on other enabling machines and devices: Secondary | ICD-10-CM | POA: Diagnosis not present

## 2021-12-21 DIAGNOSIS — Z952 Presence of prosthetic heart valve: Secondary | ICD-10-CM | POA: Diagnosis not present

## 2021-12-21 DIAGNOSIS — E785 Hyperlipidemia, unspecified: Secondary | ICD-10-CM | POA: Diagnosis not present

## 2021-12-21 DIAGNOSIS — G4733 Obstructive sleep apnea (adult) (pediatric): Secondary | ICD-10-CM | POA: Diagnosis not present

## 2021-12-21 DIAGNOSIS — Z01810 Encounter for preprocedural cardiovascular examination: Secondary | ICD-10-CM

## 2021-12-21 MED ORDER — ROSUVASTATIN CALCIUM 20 MG PO TABS: 20.0000 mg | ORAL_TABLET | Freq: Every day | ORAL | 3 refills | Status: DC

## 2021-12-21 NOTE — Patient Instructions (Signed)
Medication Instructions:  Start Crestor 20 mg ( As Directed). *If you need a refill on your cardiac medications before your next appointment, please call your pharmacy*   Lab Work: No Labs If you have labs (blood work) drawn today and your tests are completely normal, you will receive your results only by: [place] (if you have MyChart) OR A paper copy in the mail If you have any lab test that is abnormal or we need to change your treatment, we will call you to review the results.   Testing/Procedures: [address], [address]. Your physician has requested that you have an echocardiogram. Echocardiography is a painless test that uses sound waves to create images of your heart. It provides your doctor with information about the size and shape of your heart and how well your heart's chambers and valves are working. This procedure takes approximately one hour. There are no restrictions for this procedure.    Follow-Up: At [facility], you and your health needs are our priority.  As part of our continuing mission to provide you with exceptional heart care, we have created designated Provider Care Teams.  These Care Teams include your primary Cardiologist (physician) and Advanced Practice Providers (APPs -  Physician Assistants and Nurse Practitioners) who all work together to provide you with the care you need, when you need it.  We recommend signing up for the patient portal called "MyChart".  Sign up information is provided on this After Visit Summary.  MyChart is used to connect with patients for Virtual Visits (Telemedicine).  Patients are able to view lab/test results, encounter notes, upcoming appointments, etc.  Non-urgent messages can be sent to your provider as well.   To learn more about what you can do with MyChart, go to [email or web address].    Your next appointment:   1 year(s)  The format for your next appointment:   In Person  Provider:   Minus Breeding, MD       Important Information About Sugar

## 2021-12-23 ENCOUNTER — Ambulatory Visit (INDEPENDENT_AMBULATORY_CARE_PROVIDER_SITE_OTHER): Payer: Medicare HMO | Admitting: Gastroenterology

## 2021-12-23 ENCOUNTER — Other Ambulatory Visit (INDEPENDENT_AMBULATORY_CARE_PROVIDER_SITE_OTHER): Payer: Medicare HMO

## 2021-12-23 ENCOUNTER — Encounter: Payer: Self-pay | Admitting: Gastroenterology

## 2021-12-23 VITALS — BP 130/78 | HR 71

## 2021-12-23 DIAGNOSIS — R1319 Other dysphagia: Secondary | ICD-10-CM

## 2021-12-23 DIAGNOSIS — K317 Polyp of stomach and duodenum: Secondary | ICD-10-CM

## 2021-12-23 DIAGNOSIS — Z86010 Personal history of colonic polyps: Secondary | ICD-10-CM

## 2021-12-23 DIAGNOSIS — R1310 Dysphagia, unspecified: Secondary | ICD-10-CM

## 2021-12-23 DIAGNOSIS — R6881 Early satiety: Secondary | ICD-10-CM | POA: Diagnosis not present

## 2021-12-23 LAB — CBC WITH DIFFERENTIAL/PLATELET
Basophils Absolute: 0.1 10*3/uL (ref 0.0–0.1)
Basophils Relative: 1.3 % (ref 0.0–3.0)
Eosinophils Absolute: 0.8 10*3/uL — ABNORMAL HIGH (ref 0.0–0.7)
Eosinophils Relative: 10.9 % — ABNORMAL HIGH (ref 0.0–5.0)
HCT: 43 % (ref 36.0–46.0)
Hemoglobin: 14.5 g/dL (ref 12.0–15.0)
Lymphocytes Relative: 26.8 % (ref 12.0–46.0)
Lymphs Abs: 2 10*3/uL (ref 0.7–4.0)
MCHC: 33.8 g/dL (ref 30.0–36.0)
MCV: 93 fl (ref 78.0–100.0)
Monocytes Absolute: 0.5 10*3/uL (ref 0.1–1.0)
Monocytes Relative: 6.2 % (ref 3.0–12.0)
Neutro Abs: 4 10*3/uL (ref 1.4–7.7)
Neutrophils Relative %: 54.8 % (ref 43.0–77.0)
Platelets: 272 10*3/uL (ref 150.0–400.0)
RBC: 4.62 Mil/uL (ref 3.87–5.11)
RDW: 13.2 % (ref 11.5–15.5)
WBC: 7.3 10*3/uL (ref 4.0–10.5)

## 2021-12-23 LAB — IBC + FERRITIN
Ferritin: 94.1 ng/mL (ref 10.0–291.0)
Iron: 87 ug/dL (ref 42–145)
Saturation Ratios: 28 % (ref 20.0–50.0)
TIBC: 310.8 ug/dL (ref 250.0–450.0)
Transferrin: 222 mg/dL (ref 212.0–360.0)

## 2021-12-23 NOTE — Progress Notes (Signed)
[place] VISIT   Primary Care Provider [name], MD [place] [address] [place] [address] [phone]   Patient Profile: MANMEET ARZOLA is a 66 y.o. female with a pmh significant for postpolio syndrome and paralysis, CAD, status post TAVR, arthritis, allergies, MDD, anxiety, OSA, obesity, family history colon cancer (mother), diverticulosis with prior diverticulitis, GERD, prior H. pylori infection (eradicated), cervical esophageal web (status post dilation).  The patient presents to the [facility] for an evaluation and management of problem(s) noted below:  Problem List 1. Esophageal dysphagia   2. Gastric polyps   3. Hx of adenomatous colonic polyps   4. Early satiety     History of Present Illness Please see prior notes for full details of HPI.  Interval History The patient returns for follow-up.  The patient states that after her endoscopy and colonoscopy she felt unwell for 1 to 2 weeks.  However she is now feeling well.  Her previous issues of dysphagia have improved status post her dilation.  She is not having significant early satiety symptoms.  She is not having any unintentional weight loss.  Bowels are moving regularly.  She has not noted any blood in her stools.  She is going to be undergoing toe resections on her left foot in the coming weeks.  GI Review of Systems Positive as above Negative for odynophagia, nausea, vomiting, pain   Review of Systems General: Denies fevers/chills/unintentional weight loss Cardiovascular: Denies chest pain Pulmonary: Denies shortness of breath Gastroenterological: See HPI Genitourinary: Denies darkened urine Hematological: Denies easy bruising/bleeding Dermatological: Denies jaundice Psychological: Mood is stable   Medications Current Outpatient Medications  Medication Sig Dispense Refill   ALPRAZolam (XANAX) 0.5 MG tablet Take 0.5 mg by mouth 2 (two)  times daily as needed for sleep.      aspirin EC 81 MG tablet Take 81 mg by mouth daily. Swallow whole.     azithromycin (ZITHROMAX) 500 MG tablet Take 1 tablet (500 mg) one hour prior to all dental visits. 4 tablet 12   cetirizine (ZYRTEC) 10 MG tablet Take 10 mg by mouth as needed for allergies.     Cholecalciferol 50 MCG (2000 UT) TABS Take 2,000 Units by mouth daily.      docusate sodium (COLACE) 100 MG capsule Take 200 mg by mouth daily.     fluticasone (FLONASE) 50 MCG/ACT nasal spray Place 1 spray into both nostrils as needed for allergies or rhinitis.     gabapentin (NEURONTIN) 300 MG capsule Take 300 mg by mouth 2 (two) times daily.     Loteprednol Etabonate (EYSUVIS) 0.25 % SUSP Place 1 drop into both eyes 3 (three) times a week.     pantoprazole (PROTONIX) 40 MG tablet Take 40 mg by mouth daily.     Propylene Glycol (SYSTANE BALANCE) 0.6 % SOLN Place 1 drop into both eyes in the morning, at noon, in the evening, and at bedtime.     rosuvastatin (CRESTOR) 20 MG tablet Take 1 tablet (20 mg total) by mouth daily. 90 tablet 3   traZODone (DESYREL) 150 MG tablet Take 150 mg by mouth at bedtime.     venlafaxine XR (EFFEXOR-XR) 75 MG 24 hr capsule Take 75 mg by mouth daily with breakfast.     vitamin B-12 (CYANOCOBALAMIN) 250 MCG tablet Take 250 mcg by mouth daily.     No current facility-administered medications for this visit.    Allergies Allergies  Allergen Reactions   Amoxicillin  Anaphylaxis and Swelling    Did it involve swelling of the face/tongue/throat, SOB, or low BP? Yes Did it involve sudden or severe rash/hives, skin peeling, or any reaction on the inside of your mouth or nose? No Did you need to seek medical attention at a hospital or doctor's office? Yes When did it last happen?    yrs ago   If all above answers are "NO", may proceed with cephalosporin use.    Flagyl [Metronidazole] Anaphylaxis   Plaquenil [Hydroxychloroquine Sulfate] Other (See Comments)    Blurred  vision, joint pain   Codeine Nausea Only   Doxycycline Nausea And Vomiting   Other Nausea Only    Tylenol with Codeine #3   Percocet [Oxycodone-Acetaminophen] Nausea Only   Sulfa Antibiotics Nausea Only    Histories Past Medical History:  Diagnosis Date   Allergy    seasonal allergies   Anxiety    on meds   Arthritis    low back issues   Carpal tunnel syndrome    LEFT   Cataract    bilateral -sx   Cervical pain (neck)    Complication of anesthesia    DIFFICULTY WAKING UP   Coronary artery disease    Depression    on meds   GERD (gastroesophageal reflux disease)    on meds   Hyperlipidemia    diet controlled- not on meds at this time (08/05/2020)   Insomnia    Mass of right lower leg    Morbid obesity (HCC)    OSA on CPAP    uses CPAP nightly   Osteopenia    neck   PONV (postoperative nausea and vomiting)    Post-polio syndrome    With left leg paralysis   S/P TAVR (transcatheter aortic valve replacement)    Sigmoid diverticulosis    Ulnar nerve abnormality    Past Surgical History:  Procedure Laterality Date   ANKLE FUSION     left   APPENDECTOMY     BIOPSY  05/04/2021   Procedure: BIOPSY;  Surgeon: Irving Copas., MD;  Location: [facility];  Service: Gastroenterology;;   CHOLECYSTECTOMY     COLONOSCOPY     COLONOSCOPY N/A 05/04/2021   Procedure: COLONOSCOPY;  Surgeon: Irving Copas., MD;  Location: [facility];  Service: Gastroenterology;  Laterality: N/A;   ESOPHAGOGASTRODUODENOSCOPY (EGD) WITH PROPOFOL N/A 05/04/2021   Procedure: ESOPHAGOGASTRODUODENOSCOPY (EGD) WITH PROPOFOL;  Surgeon: Rush Landmark Telford Nab., MD;  Location: [facility];  Service: Gastroenterology;  Laterality: N/A;   EYE SURGERY Bilateral 2020   cataracts removed August and September 2020   JOINT REPLACEMENT Right 2003   three replacements; 2003 is last one   KNEE ARTHROPLASTY     LEG SURGERY     muscle surgery related to polio   MASS EXCISION Right  01/08/2020   Procedure: EXCISION RIGHT LEG MASS;  Surgeon: Erroll Luna, MD;  Location: [facility];  Service: General;  Laterality: Right;   POLYPECTOMY  05/04/2021   Procedure: POLYPECTOMY;  Surgeon: Irving Copas., MD;  Location: [facility];  Service: Gastroenterology;;   RECTAL PROLAPSE REPAIR  2020   pessary placed   RIGHT/LEFT HEART CATH AND CORONARY ANGIOGRAPHY N/A 04/02/2019   Procedure: RIGHT/LEFT HEART CATH AND CORONARY ANGIOGRAPHY;  Surgeon: Nelva Bush, MD;  Location: [facility];  Service: Cardiovascular;  Laterality: N/A;   SAVORY DILATION N/A 05/04/2021   Procedure: SAVORY DILATION;  Surgeon: Rush Landmark Telford Nab., MD;  Location: [facility];  Service: Gastroenterology;  Laterality: N/A;   TEE WITHOUT CARDIOVERSION N/A 04/17/2019   Procedure: TRANSESOPHAGEAL ECHOCARDIOGRAM (TEE);  Surgeon: Sherren Mocha, MD;  Location: [facility];  Service: Open Heart Surgery;  Laterality: N/A;   TOTAL HIP ARTHROPLASTY     right x 3   TRANSCATHETER AORTIC VALVE REPLACEMENT, TRANSFEMORAL N/A 04/17/2019   Procedure: TRANSCATHETER AORTIC VALVE REPLACEMENT, TRANSFEMORAL;  Surgeon: Sherren Mocha, MD;  Location: [facility];  Service: Open Heart Surgery;  Laterality: N/A;   UMBILICAL HERNIA REPAIR     Social History   Socioeconomic History   Marital status: Single    Spouse name: Not on file   Number of children: Not on file   Years of education: Not on file   Highest education level: Not on file  Occupational History   Not on file  Tobacco Use   Smoking status: Never   Smokeless tobacco: Never  Vaping Use   Vaping Use: Never used  Substance and Sexual Activity   Alcohol use: No   Drug use: Never   Sexual activity: Not on file  Other Topics Concern   Not on file  Social History Narrative   Lives alone.   Social Determinants of Health   Financial Resource Strain: Not on file  Food Insecurity: Not on file   Transportation Needs: Not on file  Physical Activity: Not on file  Stress: Not on file  Social Connections: Not on file  Intimate Partner Violence: Not on file   Family History  Problem Relation Age of Onset   Ovarian cancer Mother 57   Colon cancer Mother 72       mets from ovarian CA   Heart disease Father    CAD Other        FAM Hx OF CAD   Diabetes Other        FAM Hx of DM   Hypertension Other    Esophageal cancer Neg Hx    Rectal cancer Neg Hx    Stomach cancer Neg Hx    Inflammatory bowel disease Neg Hx    Liver disease Neg Hx    Pancreatic cancer Neg Hx    I have reviewed her medical, social, and family history in detail and updated the electronic medical record as necessary.    PHYSICAL EXAMINATION  BP 130/78   Pulse 71  Wt Readings from Last 3 Encounters:  12/21/21 250 lb (113.4 kg)  05/04/21 250 lb (113.4 kg)  03/27/21 250 lb (113.4 kg)  GEN: NAD, appears younger than stated age, doesn't appear chronically ill, in wheelchair PSYCH: Cooperative, without pressured speech EYE: Conjunctivae pink, sclerae anicteric ENT: MMM CV: Nontachycardic RESP: No audible wheezing GI: NABS, soft, rounded, obese MSK/EXT: Lower extremity edema present with left leg in Ted stocking SKIN: No jaundice NEURO:  Alert & Oriented x 3   REVIEW OF DATA  I reviewed the following data at the time of this encounter:  GI Procedures and Studies  November 2022 EGD - No gross lesions in esophagus. Biopsied. Dilated up to 17 mm with appropriate mucosal wrent noted just below UES. - Z-line regular, 35 cm from the incisors. - 2 cm hiatal hernia. - Multiple gastric polyps - likely fundic/hyperplastic/inflammatory in appearance - biopsied. - Erythematous mucosa in the stomach. Biopsied. - No gross lesions in the duodenal bulb, in the first portion of the duodenum and in the second portion of the duodenum.  November 2022 colonoscopy - Hemorrhoids found on digital rectal exam. - One 5  mm polyp in the cecum, removed with a cold snare. Resected and retrieved. - Diverticulosis in the entire examined colon. - Normal mucosa in the entire examined colon otherwise. - Non-bleeding non-thrombosed external and internal hemorrhoids.  Pathology FINAL MICROSCOPIC DIAGNOSIS:  A. DUODENUM, BIOPSY:  - Duodenal mucosa with no significant pathologic alteration.  - Negative for features of celiac disease.  - Negative for dysplasia and malignancy.  B. STOMACH, BIOPSY:  - Mild chronic gastritis.  - Negative for active inflammation and H pylori.  - Negative for intestinal metaplasia, dysplasia, and malignancy.  C. STOMACH, POLYPECTOMY:  - Fundic gland polyp.  - Negative for dysplasia and malignancy.  D. ESOPHAGUS, BIOPSY:  - Squamous mucosa with mild features suggestive of reflux.  - Negative for increased eosinophils.  - Negative for intestinal metaplasia, dysplasia, and malignancy.  E. COLON, CECUM, POLYPECTOMY:  - Tubular adenoma.  - Negative for high grade dysplasia and malignancy.   Laboratory Studies  Reviewed those in epic  Imaging Studies  No new imaging studies to review  ASSESSMENT  Ms. Mennella is a 66 y.o. female with a pmh significant for postpolio syndrome and paralysis, CAD, status post TAVR, arthritis, allergies, MDD, anxiety, OSA, obesity, family history colon cancer (mother), diverticulosis with prior diverticulitis, GERD, prior H. pylori infection (eradicated), cervical esophageal web (status post dilation).  The patient is seen today for evaluation and management of:  1. Esophageal dysphagia   2. Gastric polyps   3. Hx of adenomatous colonic polyps   4. Early satiety    The patient is hemodynamically and clinically stable at this time.  She is doing very well status post her EGD with dilation.  Her previous dysphagia symptoms have improved with dilation.  At this point, would plan repeat upper endoscopy with dilation up to 17/18/19 millimeters if possible if  she has recurrent symptoms before I perform a manometry.  Hopefully she will not need any of this.  The patient remains on PPI daily and is doing well otherwise.  There was some concern as to whether the patient could be experiencing issues of iron deficiency or anemia as some of the benign appearing hyperplastic/fundic gland polyps that were noted at endoscopy suggested some potential for oozing.  We are going to check her labs.  If she has evidence of iron deficiency with anemia, then we would recommend iron supplementation and consideration of repeat endoscopy +/- video capsule endoscopy.  If she just has a slight iron deficiency/iron insufficiency, we may recommend iron supplementation first and then repeating before we plan endoscopic evaluation, as she wants to hold on anesthesia procedures if possible.  She is recommended to have a 5-year follow-up colonoscopy due to her significant family history as well as personal history of colon adenomas, but she is not sure that she would want to undergo further colonoscopy.  She is aware that in 2027 she will have an updated recall and we can decide further whether she wants to continue colon polyp surveillance/colon cancer screening or not.  The patient will otherwise follow-up with [place] on an as-needed basis as long as she has no evidence of iron deficiency/iron insufficiency.  All patient questions were answered to the best of my ability, and the patient agrees to the aforementioned plan of action with follow-up as indicated.   PLAN  Laboratories as outlined below If patient has evidence of significant anemia and iron deficiency then we will proceed with video capsule endoscopy +/- EGD If patient has mild anemia or no  anemia with some iron deficiency then we will proceed with likely iron supplementation and then consider video capsule endoscopy +/- EGD Continue PPI once daily for now If patient has recurrent dysphagia symptoms then would plan repeat endoscopy  with dilation Colon polyp surveillance/colon cancer screening in 2027 Follow-up as needed otherwise   Orders Placed This Encounter  Procedures   CBC with Differential/Platelet   IBC + Ferritin    New Prescriptions   No medications on file   Modified Medications   No medications on file    Planned Follow Up No follow-ups on file.   Total Time in Face-to-Face and in Coordination of Care for patient including independent/personal interpretation/review of prior testing, medical history, examination, medication adjustment, communicating results with the patient directly, and documentation within the EHR is 25 minutes.   Justice Britain, MD [facility] Office # [phone]

## 2021-12-23 NOTE — Patient Instructions (Signed)
Your provider has requested that you go to the basement level for lab work before leaving today. Press "B" on the elevator. The lab is located at the first door on the left as you exit the elevator.  Follow up with our office as needed.   The [facility] providers would like to encourage you to use [facility] to communicate with providers for non-urgent requests or questions.  Due to long hold times on the telephone, sending your provider a message by [facility] may be a faster and more efficient way to get a response.  Please allow 48 business hours for a response.  Please remember that this is for non-urgent requests.   Due to recent changes in healthcare laws, you may see the results of your imaging and laboratory studies on MyChart before your provider has had a chance to review them.  We understand that in some cases there may be results that are confusing or concerning to you. Not all laboratory results come back in the same time frame and the provider may be waiting for multiple results in order to interpret others.  Please give [place] 48 hours in order for your provider to thoroughly review all the results before contacting the office for clarification of your results.

## 2021-12-28 DIAGNOSIS — N8189 Other female genital prolapse: Secondary | ICD-10-CM | POA: Diagnosis not present

## 2022-01-05 ENCOUNTER — Ambulatory Visit (HOSPITAL_COMMUNITY): Payer: Medicare HMO | Attending: Internal Medicine

## 2022-01-05 DIAGNOSIS — E785 Hyperlipidemia, unspecified: Secondary | ICD-10-CM | POA: Diagnosis not present

## 2022-01-05 DIAGNOSIS — Z9989 Dependence on other enabling machines and devices: Secondary | ICD-10-CM | POA: Insufficient documentation

## 2022-01-05 DIAGNOSIS — Z01810 Encounter for preprocedural cardiovascular examination: Secondary | ICD-10-CM

## 2022-01-05 DIAGNOSIS — G4733 Obstructive sleep apnea (adult) (pediatric): Secondary | ICD-10-CM

## 2022-01-05 DIAGNOSIS — Z952 Presence of prosthetic heart valve: Secondary | ICD-10-CM

## 2022-01-05 LAB — ECHOCARDIOGRAM COMPLETE
AR max vel: 1.27 cm2
AV Area VTI: 1.01 cm2
AV Area mean vel: 1.07 cm2
AV Mean grad: 9 mmHg
AV Peak grad: 17.3 mmHg
Ao pk vel: 2.08 m/s
Area-P 1/2: 2.44 cm2
S' Lateral: 2.9 cm

## 2022-01-05 MED ORDER — PERFLUTREN LIPID MICROSPHERE
1.0000 mL | INTRAVENOUS | Status: AC | PRN
  Administered 2022-01-05: 1 mL via INTRAVENOUS

## 2022-01-06 NOTE — Telephone Encounter (Signed)
Bishop Limbo, surgery scheduler from Dr. Doran Durand office. They did receive the clearance notes though was not noted about holding ASA. Need clearance recommendations in regard to holding ASA.  I will forward this to the pre op provider today to review for ASA clearance and recommendations.

## 2022-01-07 NOTE — Telephone Encounter (Signed)
   Patient Name: [name]  DOB: [birth year] MRN: [id number]  Primary Cardiologist: Minus Breeding, MD  Chart reviewed as part of pre-operative protocol coverage.  Patient was cleared for surgery on 12/21/2021 by Fabian Sharp, PA.  Per office protocol, patient may hold aspirin for 5 to 7 days prior to surgery.  Please resume aspirin as soon as possible postprocedure, at the discretion of the surgeon.   I will route this recommendation to the requesting party via Epic fax function and remove from pre-op pool.  Please call with questions.  Lenna Sciara, NP 01/07/2022, 2:52 PM

## 2022-01-10 ENCOUNTER — Emergency Department (HOSPITAL_BASED_OUTPATIENT_CLINIC_OR_DEPARTMENT_OTHER)
Admission: EM | Admit: 2022-01-10 | Discharge: 2022-01-10 | Disposition: A | Discharge status: Home / Self Care | Payer: Medicare HMO | Attending: Emergency Medicine | Admitting: Emergency Medicine

## 2022-01-10 ENCOUNTER — Emergency Department (HOSPITAL_BASED_OUTPATIENT_CLINIC_OR_DEPARTMENT_OTHER): Payer: Medicare HMO | Admitting: Radiology

## 2022-01-10 ENCOUNTER — Encounter (HOSPITAL_BASED_OUTPATIENT_CLINIC_OR_DEPARTMENT_OTHER): Payer: Self-pay

## 2022-01-10 DIAGNOSIS — I2510 Atherosclerotic heart disease of native coronary artery without angina pectoris: Secondary | ICD-10-CM | POA: Insufficient documentation

## 2022-01-10 DIAGNOSIS — M546 Pain in thoracic spine: Secondary | ICD-10-CM | POA: Insufficient documentation

## 2022-01-10 DIAGNOSIS — S46912A Strain of unspecified muscle, fascia and tendon at shoulder and upper arm level, left arm, initial encounter: Secondary | ICD-10-CM | POA: Diagnosis not present

## 2022-01-10 DIAGNOSIS — Z7982 Long term (current) use of aspirin: Secondary | ICD-10-CM | POA: Diagnosis not present

## 2022-01-10 DIAGNOSIS — R079 Chest pain, unspecified: Secondary | ICD-10-CM | POA: Diagnosis not present

## 2022-01-10 DIAGNOSIS — S29012A Strain of muscle and tendon of back wall of thorax, initial encounter: Secondary | ICD-10-CM | POA: Diagnosis not present

## 2022-01-10 DIAGNOSIS — T148XXA Other injury of unspecified body region, initial encounter: Secondary | ICD-10-CM

## 2022-01-10 DIAGNOSIS — M25512 Pain in left shoulder: Secondary | ICD-10-CM | POA: Insufficient documentation

## 2022-01-10 MED ORDER — LIDOCAINE 5 % EX PTCH: 1.0000 | MEDICATED_PATCH | CUTANEOUS | 0 refills | Status: DC

## 2022-01-10 MED ORDER — CYCLOBENZAPRINE HCL 10 MG PO TABS: 10.0000 mg | ORAL_TABLET | Freq: Two times a day (BID) | ORAL | 0 refills | Status: DC | PRN

## 2022-01-10 MED ORDER — LIDOCAINE 5 % EX PTCH
1.0000 | MEDICATED_PATCH | CUTANEOUS | Status: DC
  Administered 2022-01-10: 1 via TRANSDERMAL
  Filled 2022-01-10: qty 1

## 2022-01-10 NOTE — ED Notes (Signed)
Left shoulder pain, ongoing since Tuesday. No recent falls. No pops but states it feels as if it comes out of socket. PMS in left hand.

## 2022-01-10 NOTE — ED Provider Notes (Signed)
[facility] EMERGENCY DEPT Provider Note   CSN: [id number] Arrival date & time: 01/10/22  1932     History  Chief Complaint  Patient presents with   Back Pain    [name] is a 66 y.o. female.   Back Pain   Patient has a history of postpolio syndrome, coronary artery disease, transcatheter aortic valve replacement, GERD, hyperlipidemia and is wheelchair dependent because of her polio.  She is able to transfer using her arms but is paralyzed in the left lower extremity and has weakness in her legs.  Patient states she was transferring the other day when she felt something pull in her shoulder rib area.  She felt like her left shoulder looked different than her right.  Patient states the shoulder pain has improved but she continues to have pain in her left back area in the thoracic region.  It hurts to move.  She tried taking over-the-counter medications at home as well as an old hydrocodone.  Patient is going to follow-up with her orthopedic doctor but this evening the pain was more severe and she came to the ED for evaluation  Home Medications Prior to Admission medications   Medication Sig Start Date End Date Taking? Authorizing Provider  cyclobenzaprine (FLEXERIL) 10 MG tablet Take 1 tablet (10 mg total) by mouth 2 (two) times daily as needed for muscle spasms. 01/10/22  Yes Dorie Rank, MD  lidocaine (LIDODERM) 5 % Place 1 patch onto the skin daily. Remove & Discard patch within 12 hours or as directed by MD 01/10/22  Yes Dorie Rank, MD  ALPRAZolam Duanne Moron) 0.5 MG tablet Take 0.5 mg by mouth 2 (two) times daily as needed for sleep.     [provider]  aspirin EC 81 MG tablet Take 81 mg by mouth daily. Swallow whole.    [provider]  azithromycin (ZITHROMAX) 500 MG tablet Take 1 tablet (500 mg) one hour prior to all dental visits. 03/19/20   Eileen Stanford, PA-C  cetirizine (ZYRTEC) 10 MG tablet Take 10 mg by mouth as needed for allergies.     [provider]  Cholecalciferol 50 MCG (2000 UT) TABS Take 2,000 Units by mouth daily.     [provider]  docusate sodium (COLACE) 100 MG capsule Take 200 mg by mouth daily.    [provider]  fluticasone (FLONASE) 50 MCG/ACT nasal spray Place 1 spray into both nostrils as needed for allergies or rhinitis.    [provider]  gabapentin (NEURONTIN) 300 MG capsule Take 300 mg by mouth 2 (two) times daily.    [provider]  Loteprednol Etabonate (EYSUVIS) 0.25 % SUSP Place 1 drop into both eyes 3 (three) times a week.    [provider]  pantoprazole (PROTONIX) 40 MG tablet Take 40 mg by mouth daily.    [provider]  Propylene Glycol (SYSTANE BALANCE) 0.6 % SOLN Place 1 drop into both eyes in the morning, at noon, in the evening, and at bedtime.    [provider]  rosuvastatin (CRESTOR) 20 MG tablet Take 1 tablet (20 mg total) by mouth daily. 12/21/21   [facility], Tami Lin, PA  traZODone (DESYREL) 150 MG tablet Take 150 mg by mouth at bedtime.    [provider]  venlafaxine XR (EFFEXOR-XR) 75 MG 24 hr capsule Take 75 mg by mouth daily with breakfast.    [provider]  vitamin B-12 (CYANOCOBALAMIN) 250 MCG tablet Take 250 mcg by mouth  daily.    [provider]      Allergies    Amoxicillin, Flagyl [metronidazole], Plaquenil [hydroxychloroquine sulfate], Codeine, Doxycycline, Other, Percocet [oxycodone-acetaminophen], and Sulfa antibiotics    Review of Systems   Review of Systems  Musculoskeletal:  Positive for back pain.    Physical Exam Updated Vital Signs BP (!) 147/109 (BP Location: Left Arm)   Pulse 80   Temp 98 F (36.7 C) (Oral)   Resp 18   Ht 1.727 m ('5\' 8"'$ )   Wt 113.4 kg   SpO2 97%   BMI 38.01 kg/m  Physical Exam Vitals and nursing note reviewed.  Constitutional:      General: She is not in acute distress.    Appearance: She is well-developed.  HENT:     Head:  Normocephalic and atraumatic.     Right Ear: External ear normal.     Left Ear: External ear normal.  Eyes:     General: No scleral icterus.       Right eye: No discharge.        Left eye: No discharge.     Conjunctiva/sclera: Conjunctivae normal.  Neck:     Trachea: No tracheal deviation.  Cardiovascular:     Rate and Rhythm: Normal rate.  Pulmonary:     Effort: Pulmonary effort is normal. No respiratory distress.     Breath sounds: No stridor.  Abdominal:     General: There is no distension.  Musculoskeletal:        General: No swelling or deformity.     Right shoulder: No swelling or deformity.     Cervical back: Neck supple.     Comments: Tenderness palpation paraspinal muscles and intercostal muscles on the left side, no erythema, no rashes  Skin:    General: Skin is warm and dry.     Findings: No rash.  Neurological:     Mental Status: She is alert. Mental status is at baseline.     Cranial Nerves: Cranial nerve deficit: no gross deficits.     Comments: [facility]     ED Results / Procedures / Treatments     Radiology DG Shoulder Left  Result Date: 01/10/2022 CLINICAL DATA:  Left shoulder pain for 2 days. EXAM: LEFT SHOULDER - 2+ VIEW COMPARISON:  None Available. FINDINGS: There is no evidence of fracture or dislocation. Mild degenerative spurring is seen involving the acromioclavicular joint. No other osseous abnormality identified. IMPRESSION: No acute findings. Mild acromioclavicular degenerative spurring. Electronically Signed   By: Marlaine Hind M.D.   On: 01/10/2022 21:36   DG Chest 2 View  Result Date: 01/10/2022 CLINICAL DATA:  Left chest and shoulder pain for 2 days. EXAM: CHEST - 2 VIEW COMPARISON:  04/19/2019 FINDINGS: The heart size and mediastinal contours are within normal limits. Prior TAVR noted. Both lungs are clear. The visualized skeletal structures are unremarkable. IMPRESSION: No active cardiopulmonary disease. Electronically Signed   By: Marlaine Hind M.D.   On: 01/10/2022 21:35    Procedures Procedures    Medications Ordered in ED Medications  lidocaine (LIDODERM) 5 % 1 patch (has no administration in time range)    ED Course/ Medical Decision Making/ A&P                           Medical Decision Making Problems Addressed: Muscle strain: acute illness or injury  Amount and/or Complexity of Data Reviewed Radiology: ordered and independent interpretation performed.  Details: X-rays without signs of fracture or dislocation  Risk Prescription drug management.   Patient presented to the ED for evaluation of shoulder and upper back pain.  Patient has history of polio.  She is wheelchair-bound and has to primarily use her arms to help lift and move herself.  Patient felt something pull earlier this week.  There is no evidence of swelling or redness.  No findings to suggest infection.  She is not having any abdominal pain or chest pain to suggest cardiac or abdominal pathology.  Patient does have some tenderness in the periscapular region and intercostal region.  X-rays do not show any acute abnormalities.  No dislocation.  No fracture I think she either pulled an intercostal muscle versus having some discomfort from a rotator cuff injury.  Patient did not want any strong pain medications.  We will try a muscle accident as requested.  Also give her lidocaine patch.  She plans on following up with her orthopedic doctor.        Final Clinical Impression(s) / ED Diagnoses Final diagnoses:  Muscle strain    Rx / DC Orders ED Discharge Orders          Ordered    lidocaine (LIDODERM) 5 %  Every 24 hours        01/10/22 2220    cyclobenzaprine (FLEXERIL) 10 MG tablet  2 times daily PRN        01/10/22 2220              Dorie Rank, MD 01/10/22 2222

## 2022-01-10 NOTE — Discharge Instructions (Addendum)
Take the medications as needed for pain and discomfort.  Follow-up with your orthopedic doctor as planned

## 2022-01-10 NOTE — ED Notes (Signed)
Discharge paperwork given and verbally understood. 

## 2022-01-10 NOTE — ED Triage Notes (Signed)
Pt presents to the ED with left shoulder/left sided back pain. States that she has to transfer herself and when she was transferring herself one day she felt a muscle pull. Has taken gabapentin, tylenol and norco at home without relief. Pt states that she has also tried using a heating pad. States "I know its a pulled muscle, but I don't know what to do about it". Pt A&Ox4 at time of triage. VSS. Pt tearful at time of triage.

## 2022-01-12 DIAGNOSIS — K219 Gastro-esophageal reflux disease without esophagitis: Secondary | ICD-10-CM | POA: Diagnosis not present

## 2022-01-12 DIAGNOSIS — E785 Hyperlipidemia, unspecified: Secondary | ICD-10-CM | POA: Diagnosis not present

## 2022-01-12 DIAGNOSIS — M8580 Other specified disorders of bone density and structure, unspecified site: Secondary | ICD-10-CM | POA: Diagnosis not present

## 2022-01-12 DIAGNOSIS — G4700 Insomnia, unspecified: Secondary | ICD-10-CM | POA: Diagnosis not present

## 2022-01-13 ENCOUNTER — Other Ambulatory Visit (HOSPITAL_COMMUNITY): Payer: Self-pay | Admitting: Orthopedic Surgery

## 2022-01-18 ENCOUNTER — Telehealth: Payer: Self-pay | Admitting: *Deleted

## 2022-01-18 DIAGNOSIS — M25512 Pain in left shoulder: Secondary | ICD-10-CM | POA: Diagnosis not present

## 2022-01-18 NOTE — Telephone Encounter (Signed)
      Patient  visit on 01/11/2022  at [facility] was for pain  Have you been able to follow up with your primary care physician? Followed up with ortho and is really feeling great relief   The patient was able to obtain any needed medicine or equipment.  Are there diet recommendations that you are having difficulty following?  NA  Patient expresses understanding of discharge instructions and education provided has no other needs at this time.    [facility] [phone] [address] , [place] [id number] Email : [email or web address]

## 2022-01-19 ENCOUNTER — Ambulatory Visit: Payer: Medicare Other | Admitting: Cardiology

## 2022-02-09 ENCOUNTER — Encounter (HOSPITAL_BASED_OUTPATIENT_CLINIC_OR_DEPARTMENT_OTHER): Payer: Self-pay | Admitting: Orthopedic Surgery

## 2022-02-09 ENCOUNTER — Other Ambulatory Visit: Payer: Self-pay

## 2022-02-17 DIAGNOSIS — Z1231 Encounter for screening mammogram for malignant neoplasm of breast: Secondary | ICD-10-CM | POA: Diagnosis not present

## 2022-02-18 ENCOUNTER — Encounter (HOSPITAL_BASED_OUTPATIENT_CLINIC_OR_DEPARTMENT_OTHER)
Admission: RE | Disposition: A | Discharge status: Home / Self Care | Payer: Self-pay | Source: Home / Self Care | Attending: Orthopedic Surgery

## 2022-02-18 ENCOUNTER — Other Ambulatory Visit: Payer: Self-pay

## 2022-02-18 ENCOUNTER — Ambulatory Visit (HOSPITAL_BASED_OUTPATIENT_CLINIC_OR_DEPARTMENT_OTHER): Payer: Medicare HMO | Admitting: Certified Registered"

## 2022-02-18 ENCOUNTER — Encounter (HOSPITAL_BASED_OUTPATIENT_CLINIC_OR_DEPARTMENT_OTHER): Payer: Self-pay | Admitting: Orthopedic Surgery

## 2022-02-18 ENCOUNTER — Ambulatory Visit (HOSPITAL_BASED_OUTPATIENT_CLINIC_OR_DEPARTMENT_OTHER)
Admission: RE | Admit: 2022-02-18 | Discharge: 2022-02-18 | Disposition: A | Discharge status: Home / Self Care | Payer: Medicare HMO | Attending: Orthopedic Surgery | Admitting: Orthopedic Surgery

## 2022-02-18 DIAGNOSIS — F32A Depression, unspecified: Secondary | ICD-10-CM | POA: Diagnosis not present

## 2022-02-18 DIAGNOSIS — Z9989 Dependence on other enabling machines and devices: Secondary | ICD-10-CM | POA: Diagnosis not present

## 2022-02-18 DIAGNOSIS — K219 Gastro-esophageal reflux disease without esophagitis: Secondary | ICD-10-CM | POA: Insufficient documentation

## 2022-02-18 DIAGNOSIS — Z6839 Body mass index (BMI) 39.0-39.9, adult: Secondary | ICD-10-CM | POA: Diagnosis not present

## 2022-02-18 DIAGNOSIS — E785 Hyperlipidemia, unspecified: Secondary | ICD-10-CM | POA: Insufficient documentation

## 2022-02-18 DIAGNOSIS — B91 Sequelae of poliomyelitis: Secondary | ICD-10-CM | POA: Insufficient documentation

## 2022-02-18 DIAGNOSIS — G8314 Monoplegia of lower limb affecting left nondominant side: Secondary | ICD-10-CM | POA: Insufficient documentation

## 2022-02-18 DIAGNOSIS — M2042 Other hammer toe(s) (acquired), left foot: Secondary | ICD-10-CM | POA: Diagnosis not present

## 2022-02-18 DIAGNOSIS — G5620 Lesion of ulnar nerve, unspecified upper limb: Secondary | ICD-10-CM | POA: Insufficient documentation

## 2022-02-18 DIAGNOSIS — Z952 Presence of prosthetic heart valve: Secondary | ICD-10-CM | POA: Insufficient documentation

## 2022-02-18 DIAGNOSIS — Z79899 Other long term (current) drug therapy: Secondary | ICD-10-CM | POA: Insufficient documentation

## 2022-02-18 DIAGNOSIS — F419 Anxiety disorder, unspecified: Secondary | ICD-10-CM | POA: Diagnosis not present

## 2022-02-18 DIAGNOSIS — M62472 Contracture of muscle, left ankle and foot: Secondary | ICD-10-CM | POA: Insufficient documentation

## 2022-02-18 DIAGNOSIS — G4733 Obstructive sleep apnea (adult) (pediatric): Secondary | ICD-10-CM

## 2022-02-18 DIAGNOSIS — I509 Heart failure, unspecified: Secondary | ICD-10-CM | POA: Diagnosis not present

## 2022-02-18 DIAGNOSIS — Z01818 Encounter for other preprocedural examination: Secondary | ICD-10-CM

## 2022-02-18 HISTORY — PX: AMPUTATION TOE: SHX6595

## 2022-02-18 SURGERY — AMPUTATION, TOE
Anesthesia: General | Site: Foot | Laterality: Left

## 2022-02-18 MED ORDER — EPHEDRINE 5 MG/ML INJ
INTRAVENOUS | Status: AC
Start: 2022-02-18 — End: ?
  Filled 2022-02-18: qty 5

## 2022-02-18 MED ORDER — PROPOFOL 10 MG/ML IV BOLUS
INTRAVENOUS | Status: DC | PRN
  Administered 2022-02-18: 150 mg via INTRAVENOUS

## 2022-02-18 MED ORDER — FENTANYL CITRATE (PF) 100 MCG/2ML IJ SOLN
INTRAMUSCULAR | Status: DC | PRN
  Administered 2022-02-18 (×2): 50 ug via INTRAVENOUS

## 2022-02-18 MED ORDER — PROPOFOL 500 MG/50ML IV EMUL
INTRAVENOUS | Status: DC | PRN
  Administered 2022-02-18: 25 ug/kg/min via INTRAVENOUS

## 2022-02-18 MED ORDER — SODIUM CHLORIDE 0.9 % IV SOLN: INTRAVENOUS | Status: DC

## 2022-02-18 MED ORDER — LIDOCAINE HCL (CARDIAC) PF 100 MG/5ML IV SOSY
PREFILLED_SYRINGE | INTRAVENOUS | Status: DC | PRN
  Administered 2022-02-18: 60 mg via INTRAVENOUS

## 2022-02-18 MED ORDER — FENTANYL CITRATE (PF) 100 MCG/2ML IJ SOLN
INTRAMUSCULAR | Status: AC
  Filled 2022-02-18: qty 2

## 2022-02-18 MED ORDER — CEFAZOLIN SODIUM-DEXTROSE 2-4 GM/100ML-% IV SOLN
INTRAVENOUS | Status: AC
  Filled 2022-02-18: qty 100

## 2022-02-18 MED ORDER — LACTATED RINGERS IV SOLN: INTRAVENOUS | Status: DC

## 2022-02-18 MED ORDER — CEFAZOLIN SODIUM-DEXTROSE 2-4 GM/100ML-% IV SOLN
2.0000 g | INTRAVENOUS | Status: AC
  Administered 2022-02-18: 2 g via INTRAVENOUS

## 2022-02-18 MED ORDER — FENTANYL CITRATE (PF) 100 MCG/2ML IJ SOLN
25.0000 ug | INTRAMUSCULAR | Status: DC | PRN
  Administered 2022-02-18 (×2): 50 ug via INTRAVENOUS

## 2022-02-18 MED ORDER — ONDANSETRON HCL 4 MG PO TABS: 4.0000 mg | ORAL_TABLET | Freq: Three times a day (TID) | ORAL | 0 refills | Status: DC | PRN

## 2022-02-18 MED ORDER — OXYCODONE HCL 5 MG PO TABS: 5.0000 mg | ORAL_TABLET | Freq: Four times a day (QID) | ORAL | 0 refills | Status: AC | PRN

## 2022-02-18 MED ORDER — OXYCODONE HCL 5 MG PO TABS
5.0000 mg | ORAL_TABLET | Freq: Once | ORAL | Status: AC
  Administered 2022-02-18: 5 mg via ORAL

## 2022-02-18 MED ORDER — ONDANSETRON HCL 4 MG/2ML IJ SOLN
INTRAMUSCULAR | Status: DC | PRN
  Administered 2022-02-18: 4 mg via INTRAVENOUS

## 2022-02-18 MED ORDER — DEXMEDETOMIDINE HCL IN NACL 80 MCG/20ML IV SOLN
INTRAVENOUS | Status: AC
  Filled 2022-02-18: qty 40

## 2022-02-18 MED ORDER — PHENYLEPHRINE 80 MCG/ML (10ML) SYRINGE FOR IV PUSH (FOR BLOOD PRESSURE SUPPORT)
PREFILLED_SYRINGE | INTRAVENOUS | Status: AC
Start: 2022-02-18 — End: ?
  Filled 2022-02-18: qty 30

## 2022-02-18 MED ORDER — ONDANSETRON HCL 4 MG/2ML IJ SOLN: 4.0000 mg | Freq: Once | INTRAMUSCULAR | Status: DC | PRN

## 2022-02-18 MED ORDER — MIDAZOLAM HCL 5 MG/5ML IJ SOLN: INTRAMUSCULAR | Status: DC | PRN

## 2022-02-18 MED ORDER — MIDAZOLAM HCL 2 MG/2ML IJ SOLN
INTRAMUSCULAR | Status: AC
  Filled 2022-02-18: qty 2

## 2022-02-18 MED ORDER — OXYCODONE HCL 5 MG PO TABS
ORAL_TABLET | ORAL | Status: AC
  Filled 2022-02-18: qty 1

## 2022-02-18 MED ORDER — BUPIVACAINE-EPINEPHRINE 0.5% -1:200000 IJ SOLN
INTRAMUSCULAR | Status: DC | PRN
  Administered 2022-02-18: 10 mL

## 2022-02-18 MED ORDER — VANCOMYCIN HCL 500 MG IV SOLR
INTRAVENOUS | Status: DC | PRN
  Administered 2022-02-18: 500 mg via TOPICAL

## 2022-02-18 MED ORDER — SENNA 8.6 MG PO TABS
2.0000 | ORAL_TABLET | Freq: Two times a day (BID) | ORAL | 0 refills | Status: DC
Start: 2022-02-18 — End: 2022-03-18

## 2022-02-18 MED ORDER — DEXAMETHASONE SODIUM PHOSPHATE 10 MG/ML IJ SOLN
INTRAMUSCULAR | Status: AC
Start: 2022-02-18 — End: ?
  Filled 2022-02-18: qty 1

## 2022-02-18 MED ORDER — DOCUSATE SODIUM 100 MG PO CAPS: 100.0000 mg | ORAL_CAPSULE | Freq: Two times a day (BID) | ORAL | 0 refills | Status: AC

## 2022-02-18 MED ORDER — 0.9 % SODIUM CHLORIDE (POUR BTL) OPTIME
TOPICAL | Status: DC | PRN
  Administered 2022-02-18: 200 mL

## 2022-02-18 SURGICAL SUPPLY — 61 items
APL PRP STRL LF DISP 70% ISPRP (MISCELLANEOUS) ×1
BLADE AVERAGE 25X9 (BLADE) IMPLANT
BLADE OSC/SAG .038X5.5 CUT EDG (BLADE) IMPLANT
BLADE SURG 10 STRL SS (BLADE) IMPLANT
BLADE SURG 15 STRL LF DISP TIS (BLADE) ×1 IMPLANT
BLADE SURG 15 STRL SS (BLADE) ×1
BNDG CMPR 9X4 STRL LF SNTH (GAUZE/BANDAGES/DRESSINGS) ×1
BNDG ELASTIC 4X5.8 VLCR STR LF (GAUZE/BANDAGES/DRESSINGS) ×1 IMPLANT
BNDG ESMARK 4X9 LF (GAUZE/BANDAGES/DRESSINGS) ×1 IMPLANT
BNDG GZE 12X3 1 PLY HI ABS (GAUZE/BANDAGES/DRESSINGS) ×1
BNDG STRETCH GAUZE 3IN X12FT (GAUZE/BANDAGES/DRESSINGS) IMPLANT
CHLORAPREP W/TINT 26 (MISCELLANEOUS) ×1 IMPLANT
COVER BACK TABLE 60X90IN (DRAPES) ×1 IMPLANT
CUFF TOURN SGL QUICK 24 (TOURNIQUET CUFF)
CUFF TRNQT CYL 24X4X16.5-23 (TOURNIQUET CUFF) IMPLANT
DRAPE EXTREMITY T 121X128X90 (DISPOSABLE) ×1 IMPLANT
DRAPE SURG 17X23 STRL (DRAPES) ×1 IMPLANT
DRAPE U-SHAPE 47X51 STRL (DRAPES) ×1 IMPLANT
DRSG EMULSION OIL 3X3 NADH (GAUZE/BANDAGES/DRESSINGS) IMPLANT
DRSG MEPITEL 4X7.2 (GAUZE/BANDAGES/DRESSINGS) ×1 IMPLANT
ELECT REM PT RETURN 9FT ADLT (ELECTROSURGICAL) ×1
ELECTRODE REM PT RTRN 9FT ADLT (ELECTROSURGICAL) ×1 IMPLANT
GAUZE PAD ABD 8X10 STRL (GAUZE/BANDAGES/DRESSINGS) IMPLANT
GAUZE SPONGE 4X4 12PLY STRL (GAUZE/BANDAGES/DRESSINGS) ×1 IMPLANT
GLOVE BIO SURGEON STRL SZ8 (GLOVE) ×1 IMPLANT
GLOVE BIOGEL PI IND STRL 7.0 (GLOVE) IMPLANT
GLOVE BIOGEL PI IND STRL 8 (GLOVE) ×2 IMPLANT
GLOVE ECLIPSE 8.0 STRL XLNG CF (GLOVE) ×1 IMPLANT
GLOVE SURG SS PI 7.0 STRL IVOR (GLOVE) IMPLANT
GOWN STRL REUS W/ TWL LRG LVL3 (GOWN DISPOSABLE) ×1 IMPLANT
GOWN STRL REUS W/ TWL XL LVL3 (GOWN DISPOSABLE) ×2 IMPLANT
GOWN STRL REUS W/TWL LRG LVL3 (GOWN DISPOSABLE) ×2
GOWN STRL REUS W/TWL XL LVL3 (GOWN DISPOSABLE) ×2
NDL HYPO 25X1 1.5 SAFETY (NEEDLE) IMPLANT
NDL SAFETY ECLIP 18X1.5 (MISCELLANEOUS) IMPLANT
NEEDLE HYPO 25X1 1.5 SAFETY (NEEDLE) ×1 IMPLANT
NS IRRIG 1000ML POUR BTL (IV SOLUTION) ×1 IMPLANT
PACK BASIN DAY SURGERY FS (CUSTOM PROCEDURE TRAY) ×1 IMPLANT
PAD CAST 4YDX4 CTTN HI CHSV (CAST SUPPLIES) ×1 IMPLANT
PADDING CAST COTTON 4X4 STRL (CAST SUPPLIES) ×1
PENCIL SMOKE EVACUATOR (MISCELLANEOUS) ×1 IMPLANT
SANITIZER HAND PURELL 535ML FO (MISCELLANEOUS) ×1 IMPLANT
SHEET MEDIUM DRAPE 40X70 STRL (DRAPES) ×1 IMPLANT
SLEEVE SCD COMPRESS KNEE MED (STOCKING) ×1 IMPLANT
SPIKE FLUID TRANSFER (MISCELLANEOUS) IMPLANT
SPONGE T-LAP 18X18 ~~LOC~~+RFID (SPONGE) ×1 IMPLANT
STOCKINETTE 6  STRL (DRAPES) ×1
STOCKINETTE 6 STRL (DRAPES) ×1 IMPLANT
SUCTION FRAZIER HANDLE 10FR (MISCELLANEOUS) ×1
SUCTION TUBE FRAZIER 10FR DISP (MISCELLANEOUS) IMPLANT
SUT ETHILON 2 0 FS 18 (SUTURE) IMPLANT
SUT ETHILON 2 0 FSLX (SUTURE) IMPLANT
SUT ETHILON 3 0 PS 1 (SUTURE) IMPLANT
SUT MNCRL AB 3-0 PS2 18 (SUTURE) IMPLANT
SWAB COLLECTION DEVICE MRSA (MISCELLANEOUS) IMPLANT
SWAB CULTURE ESWAB REG 1ML (MISCELLANEOUS) IMPLANT
SYR BULB EAR ULCER 3OZ GRN STR (SYRINGE) ×1 IMPLANT
SYR CONTROL 10ML LL (SYRINGE) IMPLANT
TOWEL GREEN STERILE FF (TOWEL DISPOSABLE) ×1 IMPLANT
TUBE CONNECTING 20X1/4 (TUBING) IMPLANT
UNDERPAD 30X36 HEAVY ABSORB (UNDERPADS AND DIAPERS) ×1 IMPLANT

## 2022-02-18 NOTE — Transfer of Care (Signed)
Immediate Anesthesia Transfer of Care Note  Patient: [name]  Procedure(s) Performed: Second and Third distal interphalangeal and Fourth proximal interphalangeal  toe amputations (Left: Foot)  Patient Location: PACU  Anesthesia Type:General  Level of Consciousness: awake, alert , oriented and patient cooperative  Airway & Oxygen Therapy: Patient Spontanous Breathing and Patient connected to face mask oxygen  Post-op Assessment: Report given to RN and Post -op Vital signs reviewed and stable  Post vital signs: Reviewed and stable  Last Vitals:  Vitals Value Taken Time  BP 162/92 02/18/22 1007  Temp    Pulse 86 02/18/22 1009  Resp 16 02/18/22 1009  SpO2 96 % 02/18/22 1009  Vitals shown include unvalidated device data.  Last Pain:  Vitals:   02/18/22 0750  TempSrc: Oral  PainSc: 0-No pain      Patients Stated Pain Goal: 3 (10/22/00 1117)  Complications: No notable events documented.

## 2022-02-18 NOTE — Anesthesia Preprocedure Evaluation (Addendum)
Anesthesia Evaluation  Patient identified by MRN, date of birth, ID band Patient awake    Reviewed: Allergy & Precautions, NPO status , Patient's Chart, lab work & pertinent test results  History of Anesthesia Complications (+) PONV and history of anesthetic complications  Airway Mallampati: III  TM Distance: >3 FB Neck ROM: Full    Dental no notable dental hx. (+) Teeth Intact, Dental Advisory Given   Pulmonary sleep apnea and Continuous Positive Airway Pressure Ventilation ,    Pulmonary exam normal breath sounds clear to auscultation       Cardiovascular +CHF  Normal cardiovascular exam+ Valvular Problems/Murmurs AS  Rhythm:Regular  Hx/o AS-severe S/P TAVR  Echo 01/05/22 1. The aortic valve was not well visualized. 26 mm Sapien Valve, mean gradinet 9 mm Hg, no PVL, DVI 0.40. Aortic valve regurgitation is not visualized. No aortic stenosis is present. Echo findings are consistent with normal structure and function of the aortic valve prosthesis.  2. Left ventricular ejection fraction, by estimation, is 60 to 65%. The left ventricle has normal function. The left ventricle has no regional wall motion abnormalities. Left ventricular diastolic parameters are consistent with Grade I diastolic dysfunction (impaired relaxation).  3. Right ventricular systolic function is normal. The right ventricular size is normal. Tricuspid regurgitation signal is inadequate for assessing PA pressure.  4. The mitral valve is normal in structure. No evidence of mitral valve regurgitation. No evidence of mitral stenosis.    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   Abdominal (+) + obese,   Peds  Hematology   Anesthesia Other Findings   Reproductive/Obstetrics                           Anesthesia Physical Anesthesia Plan  ASA: 3  Anesthesia Plan: General   Post-op Pain Management:    Induction: Intravenous  PONV Risk Score and Plan: Treatment may vary due to age or medical condition, TIVA, Ondansetron, Dexamethasone and Propofol infusion  Airway Management Planned: LMA  Additional Equipment: None  Intra-op Plan:   Post-operative Plan: Extubation in OR  Informed Consent: I have reviewed the patients History and Physical, chart, labs and discussed the procedure including the risks, benefits and alternatives for the proposed anesthesia with the patient or authorized representative who has indicated his/her understanding and acceptance.     Dental advisory given  Plan Discussed with: CRNA and Anesthesiologist  Anesthesia Plan Comments:         Anesthesia Quick Evaluation

## 2022-02-18 NOTE — Anesthesia Procedure Notes (Signed)
Procedure Name: LMA Insertion Date/Time: 02/18/2022 9:33 AM  Performed by: Reinhardt Licausi, Ernesta Amble, CRNAPre-anesthesia Checklist: Patient identified, Emergency Drugs available, Suction available and Patient being monitored Patient Re-evaluated:Patient Re-evaluated prior to induction Oxygen Delivery Method: Circle system utilized Preoxygenation: Pre-oxygenation with 100% oxygen Induction Type: IV induction Ventilation: Mask ventilation without difficulty LMA: LMA inserted LMA Size: 4.0 Number of attempts: 1 Airway Equipment and Method: Bite block Placement Confirmation: positive ETCO2 Tube secured with: Tape Dental Injury: Teeth and Oropharynx as per pre-operative assessment

## 2022-02-18 NOTE — Discharge Instructions (Addendum)
Debra Simmer, MD [facility]  Please read the following information regarding your care after surgery.  Medications  You only need a prescription for the narcotic pain medicine (ex. oxycodone, Percocet, Norco).  All of the other medicines listed below are available over the counter. ? Aleve 2 pills twice a day for the first 3 days after surgery. ? acetominophen (Tylenol) 650 mg every 4-6 hours as you need for minor to moderate pain ? oxycodone as prescribed for severe pain; zofran as prescribed for nausea  Narcotic pain medicine (ex. oxycodone, Percocet, Vicodin) will cause constipation.  To prevent this problem, take the following medicines while you are taking any pain medicine. ? docusate sodium (Colace) 100 mg twice a day ? senna (Senokot) 2 tablets twice a day  Weight Bearing ? Bear weight only on your operated foot in the post-op shoe.   Cast / Splint / Dressing ? Keep your splint, cast or dressing clean and dry.  Don't put anything (coat hanger, pencil, etc) down inside of it.  If it gets damp, use a hair dryer on the cool setting to dry it.  If it gets soaked, call the office to schedule an appointment for a cast change.   After your dressing, cast or splint is removed; you may shower, but do not soak or scrub the wound.  Allow the water to run over it, and then gently pat it dry.  Swelling It is normal for you to have swelling where you had surgery.  To reduce swelling and pain, keep your toes above your nose for at least 3 days after surgery.  It may be necessary to keep your foot or leg elevated for several weeks.  If it hurts, it should be elevated.  Follow Up Call my office at [phone] when you are discharged from the hospital or surgery center to schedule an appointment to be seen two weeks after surgery.  Call my office at [phone] if you develop a fever >101.5 F, nausea, vomiting, bleeding from the surgical site or severe pain.       Post Anesthesia Home Care  Instructions  Activity: Get plenty of rest for the remainder of the day. A responsible individual must stay with you for 24 hours following the procedure.  For the next 24 hours, DO NOT: -Drive a car -Paediatric nurse -Drink alcoholic beverages -Take any medication unless instructed by your physician -Make any legal decisions or sign important papers.  Meals: Start with liquid foods such as gelatin or soup. Progress to regular foods as tolerated. Avoid greasy, spicy, heavy foods. If nausea and/or vomiting occur, drink only clear liquids until the nausea and/or vomiting subsides. Call your physician if vomiting continues.  Special Instructions/Symptoms: Your throat may feel dry or sore from the anesthesia or the breathing tube placed in your throat during surgery. If this causes discomfort, gargle with warm salt water. The discomfort should disappear within 24 hours.  If you had a scopolamine patch placed behind your ear for the management of post- operative nausea and/or vomiting:  1. The medication in the patch is effective for 72 hours, after which it should be removed.  Wrap patch in a tissue and discard in the trash. Wash hands thoroughly with soap and water. 2. You may remove the patch earlier than 72 hours if you experience unpleasant side effects which may include dry mouth, dizziness or visual disturbances. 3. Avoid touching the patch. Wash your hands with soap and water after contact with the patch.  Oxycodone given at 11:15am today

## 2022-02-18 NOTE — H&P (Signed)
[name] is an 66 y.o. female.   Chief Complaint: Left forefoot pain HPI: 66 year old female with a past medical history significant for left lower extremity motor paralysis complains of pain at the left second, third and fourth toes.  She uses a power chair to get around.  She has trouble hooking her second, third and fourth toes on the foot plate distally.  She has had routine trauma to the toes.  We discussed treatment options including efforts to reconstruct the toe versus amputation of the tips of the toes.  She prefers amputation since it allows for quicker healing.  She presents today for surgical treatment of these lesser toe deformities.  Past Medical History:  Diagnosis Date   Allergy    seasonal allergies   Anxiety    on meds   Arthritis    low back issues   Carpal tunnel syndrome    LEFT   Cataract    bilateral -sx   Cervical pain (neck)    Complication of anesthesia    DIFFICULTY WAKING UP   Coronary artery disease    Depression    on meds   GERD (gastroesophageal reflux disease)    on meds   Hyperlipidemia    diet controlled- not on meds at this time (08/05/2020)   Insomnia    Mass of right lower leg    Morbid obesity (HCC)    OSA on CPAP    uses CPAP nightly   Osteopenia    neck   PONV (postoperative nausea and vomiting)    Post-polio syndrome    With left leg paralysis   S/P TAVR (transcatheter aortic valve replacement)    Sigmoid diverticulosis    Ulnar nerve abnormality     Past Surgical History:  Procedure Laterality Date   ANKLE FUSION     left   APPENDECTOMY     BIOPSY  05/04/2021   Procedure: BIOPSY;  Surgeon: Irving Copas., MD;  Location: [facility];  Service: Gastroenterology;;   CHOLECYSTECTOMY     COLONOSCOPY     COLONOSCOPY N/A 05/04/2021   Procedure: COLONOSCOPY;  Surgeon: Irving Copas., MD;  Location: [facility];  Service: Gastroenterology;  Laterality: N/A;   ESOPHAGOGASTRODUODENOSCOPY (EGD) WITH  PROPOFOL N/A 05/04/2021   Procedure: ESOPHAGOGASTRODUODENOSCOPY (EGD) WITH PROPOFOL;  Surgeon: Rush Landmark Telford Nab., MD;  Location: [facility];  Service: Gastroenterology;  Laterality: N/A;   EYE SURGERY Bilateral 2020   cataracts removed August and September 2020   JOINT REPLACEMENT Right 2003   three replacements; 2003 is last one   KNEE ARTHROPLASTY     LEG SURGERY     muscle surgery related to polio   MASS EXCISION Right 01/08/2020   Procedure: EXCISION RIGHT LEG MASS;  Surgeon: Erroll Luna, MD;  Location: [facility];  Service: General;  Laterality: Right;   POLYPECTOMY  05/04/2021   Procedure: POLYPECTOMY;  Surgeon: Irving Copas., MD;  Location: [facility];  Service: Gastroenterology;;   RECTAL PROLAPSE REPAIR  2020   pessary placed   RIGHT/LEFT HEART CATH AND CORONARY ANGIOGRAPHY N/A 04/02/2019   Procedure: RIGHT/LEFT HEART CATH AND CORONARY ANGIOGRAPHY;  Surgeon: Nelva Bush, MD;  Location: [facility];  Service: Cardiovascular;  Laterality: N/A;   SAVORY DILATION N/A 05/04/2021   Procedure: SAVORY DILATION;  Surgeon: Rush Landmark Telford Nab., MD;  Location: [facility];  Service: Gastroenterology;  Laterality: N/A;   TEE WITHOUT CARDIOVERSION N/A 04/17/2019   Procedure: TRANSESOPHAGEAL ECHOCARDIOGRAM (TEE);  Surgeon: Sherren Mocha,  MD;  Location: [facility];  Service: Open Heart Surgery;  Laterality: N/A;   TOTAL HIP ARTHROPLASTY     right x 3   TRANSCATHETER AORTIC VALVE REPLACEMENT, TRANSFEMORAL N/A 04/17/2019   Procedure: TRANSCATHETER AORTIC VALVE REPLACEMENT, TRANSFEMORAL;  Surgeon: Sherren Mocha, MD;  Location: [facility];  Service: Open Heart Surgery;  Laterality: N/A;   UMBILICAL HERNIA REPAIR      Family History  Problem Relation Age of Onset   Ovarian cancer Mother 73   Colon cancer Mother 50       mets from ovarian CA   Heart disease Father    CAD Other        FAM Hx OF CAD   Diabetes Other         FAM Hx of DM   Hypertension Other    Esophageal cancer Neg Hx    Rectal cancer Neg Hx    Stomach cancer Neg Hx    Inflammatory bowel disease Neg Hx    Liver disease Neg Hx    Pancreatic cancer Neg Hx    Social History:  reports that she has never smoked. She has never used smokeless tobacco. She reports that she does not drink alcohol and does not use drugs.  Allergies:  Allergies  Allergen Reactions   Amoxicillin Anaphylaxis and Swelling    Did it involve swelling of the face/tongue/throat, SOB, or low BP? Yes Did it involve sudden or severe rash/hives, skin peeling, or any reaction on the inside of your mouth or nose? No Did you need to seek medical attention at a hospital or doctor's office? Yes When did it last happen?    yrs ago   If all above answers are "NO", may proceed with cephalosporin use.    Flagyl [Metronidazole] Anaphylaxis   Plaquenil [Hydroxychloroquine Sulfate] Other (See Comments)    Blurred vision, joint pain   Codeine Nausea Only   Doxycycline Nausea And Vomiting   Other Nausea Only    Tylenol with Codeine #3   Percocet [Oxycodone-Acetaminophen] Nausea Only   Sulfa Antibiotics Nausea Only    Medications Prior to Admission  Medication Sig Dispense Refill   ALPRAZolam (XANAX) 0.5 MG tablet Take 0.5 mg by mouth 2 (two) times daily as needed for sleep.      aspirin EC 81 MG tablet Take 81 mg by mouth daily. Swallow whole.     cetirizine (ZYRTEC) 10 MG tablet Take 10 mg by mouth as needed for allergies.     Cholecalciferol 50 MCG (2000 UT) TABS Take 2,000 Units by mouth daily.      cyclobenzaprine (FLEXERIL) 10 MG tablet Take 1 tablet (10 mg total) by mouth 2 (two) times daily as needed for muscle spasms. 20 tablet 0   gabapentin (NEURONTIN) 300 MG capsule Take 300 mg by mouth 2 (two) times daily.     pantoprazole (PROTONIX) 40 MG tablet Take 40 mg by mouth daily.     rosuvastatin (CRESTOR) 20 MG tablet Take 1 tablet (20 mg total) by mouth daily. 90 tablet  3   traZODone (DESYREL) 150 MG tablet Take 150 mg by mouth at bedtime.     venlafaxine XR (EFFEXOR-XR) 75 MG 24 hr capsule Take 75 mg by mouth daily with breakfast.     docusate sodium (COLACE) 100 MG capsule Take 200 mg by mouth daily.     fluticasone (FLONASE) 50 MCG/ACT nasal spray Place 1 spray into both nostrils as needed for allergies or rhinitis.  lidocaine (LIDODERM) 5 % Place 1 patch onto the skin daily. Remove & Discard patch within 12 hours or as directed by MD 7 patch 0   Loteprednol Etabonate (EYSUVIS) 0.25 % SUSP Place 1 drop into both eyes 3 (three) times a week.     Propylene Glycol (SYSTANE BALANCE) 0.6 % SOLN Place 1 drop into both eyes in the morning, at noon, in the evening, and at bedtime.     vitamin B-12 (CYANOCOBALAMIN) 250 MCG tablet Take 250 mcg by mouth daily.      No results found for this or any previous visit (from the past 48 hour(s)). No results found.  Review of Systems no recent fever, chills, nausea, vomiting or changes in her appetite  Blood pressure 124/75, pulse 72, temperature 97.7 F (36.5 C), temperature source Oral, resp. rate 11, height '5\' 8"'$  (1.727 m), weight 117.9 kg, SpO2 99 %. Physical Exam well-nourished well-developed woman in no apparent distress.  Alert and oriented x4.  Normal mood and affect.  Extraocular motions are intact.  Respirations are unlabored.  Gait is nonweightbearing on the left.  The second, third and fourth toes are flexed and flaccidly paralyzed.  Skin is healthy and intact.  Brisk capillary refill of the toes.  No lymphadenopathy.  Intact sensibility to light touch dorsally and plantarly at the forefoot.  Assessment/Plan Left second, third and fourth toe hammertoe deformities with tight flexor tendons -to the operating room today for second and third toe amputations through the DIP joint and fourth toe amputation through the PIP joint.  The risks and benefits of the alternative treatment options have been discussed in  detail.  The patient wishes to proceed with surgery and specifically understands risks of bleeding, infection, nerve damage, blood clots, need for additional surgery, amputation and death.   Wylene Simmer, MD 03-12-22, 9:11 AM

## 2022-02-18 NOTE — Op Note (Signed)
02/18/2022  10:01 AM  PATIENT:  [name]  66 y.o. female  PRE-OPERATIVE DIAGNOSIS:  Left foot 2-4 hammertoes with tight flexor tendons  POST-OPERATIVE DIAGNOSIS:  Left foot 2-4 hammertoes with tight flexor tendons  Procedure(s):  left foot 2nd, 3rd and 4th toe amputations through the IP joints  SURGEON:  Wylene Simmer, MD  ASSISTANT: Mechele Claude, PA-C  ANESTHESIA:   General  EBL:  minimal   TOURNIQUET:   Total Tourniquet Time Documented: Calf (Left) - 16 minutes Total: Calf (Left) - 16 minutes  COMPLICATIONS:  None apparent  DISPOSITION:  Extubated, awake and stable to recovery.  INDICATION FOR PROCEDURE: 66 year old female with a past medical history significant for left lower extremity paralysis complains of pain and recurrent injuries of the lesser toes.  She has hammertoe deformities.  She uses a power chair and frequently catches or hooks the toes on the foot plate when transferring.  In clinic we discussed the risks and benefits of reconstruction versus amputation.  She has failed nonoperative treatment to date including activity and shoewear modifications.  She prefers amputation of the distal phalanges of the second and third toe and the distal and middle phalanges of the fourth toe since we will allow her to recover more quickly.  The risks and benefits of the alternative treatment options have been discussed in detail.  The patient wishes to proceed with surgery and specifically understands risks of bleeding, infection, nerve damage, blood clots, need for additional surgery, amputation and death.   PROCEDURE IN DETAIL: After preoperative consent was obtained the correct operative site was identified, the patient was brought to the operating room and placed upon the operating table.  General anesthesia was induced.  Preoperative antibiotics were administered.  Surgical timeout was taken.  The left lower extremity was prepped and draped in standard sterile fashion.  A 4  inch Esmarch bandage was used to exsanguinate the foot and was wrapped around the ankle as a tourniquet.  A fishmouth incision was made around the second toe distal interphalangeal joint.  Dissection was carried sharply down through the subcutaneous tissues.  The toe was disarticulated through the DIP joint and passed off the field.  The head of the middle phalanx was rounded with a small rondure.  Neurovascular bundles were cauterized.  The same procedure was then performed for the third toe.  At the fourth toe the incision was made around the PIP joint and otherwise performed in the same fashion.  The wounds were then irrigated copiously.  Vancomycin powder was sprinkled in the wounds.  The incisions were closed with horizontal mattress sutures of 2-0 and 3-0 nylon.  Metatarsal blocks were performed with half percent Marcaine with epinephrine at the first through fourth webspaces.  Sterile dressings were applied followed by compression wrap.  The tourniquet was released after application of the dressings.  The patient was awakened from anesthesia and transported to the recovery room in stable condition.  FOLLOW UP PLAN: Weightbearing as tolerated on the left lower extremity in a flat surgical shoe.  No indication for DVT prophylaxis.  Follow-up in the office for suture removal in 2 weeks.   Mechele Claude PA-C was present and scrubbed for the duration of the operative case. His assistance was essential in positioning the patient, prepping and draping, gaining and maintaining exposure, performing the operation, closing and dressing the wounds and applying the splint.

## 2022-02-18 NOTE — Anesthesia Postprocedure Evaluation (Signed)
Anesthesia Post Note  Patient: ALAJA GOLDINGER  Procedure(s) Performed: Second and Third distal interphalangeal and Fourth proximal interphalangeal  toe amputations (Left: Foot)     Patient location during evaluation: PACU Anesthesia Type: General Level of consciousness: awake and alert and oriented Pain management: pain level controlled Vital Signs Assessment: post-procedure vital signs reviewed and stable Respiratory status: spontaneous breathing, nonlabored ventilation and respiratory function stable Cardiovascular status: blood pressure returned to baseline and stable Postop Assessment: no apparent nausea or vomiting Anesthetic complications: no   No notable events documented.  Last Vitals:  Vitals:   02/18/22 1029 02/18/22 1030  BP: (!) 140/87 (!) 140/87  Pulse: 79 78  Resp: (!) 21 (!) 24  Temp:    SpO2: 97% 96%    Last Pain:  Vitals:   02/18/22 1029  TempSrc:   PainSc: 6                  Chinaza Rooke A.

## 2022-02-19 ENCOUNTER — Encounter (HOSPITAL_BASED_OUTPATIENT_CLINIC_OR_DEPARTMENT_OTHER): Payer: Self-pay | Admitting: Orthopedic Surgery

## 2022-03-03 ENCOUNTER — Other Ambulatory Visit: Payer: Self-pay

## 2022-03-03 ENCOUNTER — Emergency Department (HOSPITAL_BASED_OUTPATIENT_CLINIC_OR_DEPARTMENT_OTHER): Payer: Medicare HMO

## 2022-03-03 ENCOUNTER — Encounter (HOSPITAL_COMMUNITY): Payer: Self-pay

## 2022-03-03 ENCOUNTER — Encounter (HOSPITAL_BASED_OUTPATIENT_CLINIC_OR_DEPARTMENT_OTHER): Payer: Self-pay | Admitting: Emergency Medicine

## 2022-03-03 ENCOUNTER — Observation Stay (HOSPITAL_BASED_OUTPATIENT_CLINIC_OR_DEPARTMENT_OTHER)
Admission: EM | Admit: 2022-03-03 | Discharge: 2022-03-05 | Disposition: A | Discharge status: Home / Self Care | Payer: Medicare HMO | Attending: Internal Medicine | Admitting: Internal Medicine

## 2022-03-03 DIAGNOSIS — G43109 Migraine with aura, not intractable, without status migrainosus: Secondary | ICD-10-CM | POA: Insufficient documentation

## 2022-03-03 DIAGNOSIS — Z96641 Presence of right artificial hip joint: Secondary | ICD-10-CM | POA: Diagnosis not present

## 2022-03-03 DIAGNOSIS — I639 Cerebral infarction, unspecified: Secondary | ICD-10-CM | POA: Diagnosis not present

## 2022-03-03 DIAGNOSIS — I728 Aneurysm of other specified arteries: Secondary | ICD-10-CM | POA: Diagnosis not present

## 2022-03-03 DIAGNOSIS — Z7982 Long term (current) use of aspirin: Secondary | ICD-10-CM | POA: Insufficient documentation

## 2022-03-03 DIAGNOSIS — I350 Nonrheumatic aortic (valve) stenosis: Secondary | ICD-10-CM

## 2022-03-03 DIAGNOSIS — Z9989 Dependence on other enabling machines and devices: Secondary | ICD-10-CM

## 2022-03-03 DIAGNOSIS — F418 Other specified anxiety disorders: Secondary | ICD-10-CM | POA: Diagnosis present

## 2022-03-03 DIAGNOSIS — R202 Paresthesia of skin: Secondary | ICD-10-CM

## 2022-03-03 DIAGNOSIS — I2510 Atherosclerotic heart disease of native coronary artery without angina pectoris: Secondary | ICD-10-CM | POA: Diagnosis not present

## 2022-03-03 DIAGNOSIS — K219 Gastro-esophageal reflux disease without esophagitis: Secondary | ICD-10-CM | POA: Diagnosis present

## 2022-03-03 DIAGNOSIS — I5032 Chronic diastolic (congestive) heart failure: Secondary | ICD-10-CM | POA: Diagnosis not present

## 2022-03-03 DIAGNOSIS — R200 Anesthesia of skin: Principal | ICD-10-CM

## 2022-03-03 DIAGNOSIS — Z96659 Presence of unspecified artificial knee joint: Secondary | ICD-10-CM | POA: Insufficient documentation

## 2022-03-03 DIAGNOSIS — R519 Headache, unspecified: Secondary | ICD-10-CM

## 2022-03-03 DIAGNOSIS — G4733 Obstructive sleep apnea (adult) (pediatric): Secondary | ICD-10-CM

## 2022-03-03 DIAGNOSIS — Z79899 Other long term (current) drug therapy: Secondary | ICD-10-CM | POA: Insufficient documentation

## 2022-03-03 DIAGNOSIS — E785 Hyperlipidemia, unspecified: Secondary | ICD-10-CM

## 2022-03-03 DIAGNOSIS — Z952 Presence of prosthetic heart valve: Secondary | ICD-10-CM

## 2022-03-03 DIAGNOSIS — Z20822 Contact with and (suspected) exposure to covid-19: Secondary | ICD-10-CM | POA: Insufficient documentation

## 2022-03-03 DIAGNOSIS — E669 Obesity, unspecified: Secondary | ICD-10-CM | POA: Diagnosis present

## 2022-03-03 DIAGNOSIS — Z01810 Encounter for preprocedural cardiovascular examination: Secondary | ICD-10-CM

## 2022-03-03 LAB — RAPID URINE DRUG SCREEN, HOSP PERFORMED
Amphetamines: NOT DETECTED
Barbiturates: NOT DETECTED
Benzodiazepines: NOT DETECTED
Cocaine: NOT DETECTED
Opiates: NOT DETECTED
Tetrahydrocannabinol: NOT DETECTED

## 2022-03-03 LAB — DIFFERENTIAL
Abs Immature Granulocytes: 0.03 10*3/uL (ref 0.00–0.07)
Basophils Absolute: 0.1 10*3/uL (ref 0.0–0.1)
Basophils Relative: 1 %
Eosinophils Absolute: 0.9 10*3/uL — ABNORMAL HIGH (ref 0.0–0.5)
Eosinophils Relative: 10 %
Immature Granulocytes: 0 %
Lymphocytes Relative: 33 %
Lymphs Abs: 3.1 10*3/uL (ref 0.7–4.0)
Monocytes Absolute: 0.8 10*3/uL (ref 0.1–1.0)
Monocytes Relative: 9 %
Neutro Abs: 4.4 10*3/uL (ref 1.7–7.7)
Neutrophils Relative %: 47 %

## 2022-03-03 LAB — COMPREHENSIVE METABOLIC PANEL
ALT: 12 U/L (ref 0–44)
AST: 20 U/L (ref 15–41)
Albumin: 3.7 g/dL (ref 3.5–5.0)
Alkaline Phosphatase: 88 U/L (ref 38–126)
Anion gap: 9 (ref 5–15)
BUN: 10 mg/dL (ref 8–23)
CO2: 25 mmol/L (ref 22–32)
Calcium: 8.8 mg/dL — ABNORMAL LOW (ref 8.9–10.3)
Chloride: 103 mmol/L (ref 98–111)
Creatinine, Ser: 0.74 mg/dL (ref 0.44–1.00)
GFR, Estimated: >60 mL/min (ref >60)
Glucose, Bld: 122 mg/dL — ABNORMAL HIGH (ref 70–99)
Potassium: 4.3 mmol/L (ref 3.5–5.1)
Sodium: 137 mmol/L (ref 135–145)
Total Bilirubin: 0.3 mg/dL (ref 0.3–1.2)
Total Protein: 6.7 g/dL (ref 6.5–8.1)

## 2022-03-03 LAB — URINALYSIS, ROUTINE W REFLEX MICROSCOPIC
Bilirubin Urine: NEGATIVE
Glucose, UA: NEGATIVE mg/dL
Hgb urine dipstick: NEGATIVE
Ketones, ur: NEGATIVE mg/dL
Leukocytes,Ua: NEGATIVE
Nitrite: NEGATIVE
Protein, ur: NEGATIVE mg/dL
Specific Gravity, Urine: 1.028 (ref 1.005–1.030)
pH: 5.5 (ref 5.0–8.0)

## 2022-03-03 LAB — CBC
HCT: 39.4 % (ref 36.0–46.0)
Hemoglobin: 13.5 g/dL (ref 12.0–15.0)
MCH: 31.6 pg (ref 26.0–34.0)
MCHC: 34.3 g/dL (ref 30.0–36.0)
MCV: 92.3 fL (ref 80.0–100.0)
Platelets: 265 10*3/uL (ref 150–400)
RBC: 4.27 MIL/uL (ref 3.87–5.11)
RDW: 12.9 % (ref 11.5–15.5)
WBC: 9.3 10*3/uL (ref 4.0–10.5)
nRBC: 0 % (ref 0.0–0.2)

## 2022-03-03 LAB — ETHANOL: Alcohol, Ethyl (B): <10 mg/dL (ref <10)

## 2022-03-03 LAB — CBG MONITORING, ED: Glucose-Capillary: 129 mg/dL — ABNORMAL HIGH (ref 70–99)

## 2022-03-03 LAB — APTT: aPTT: 28 seconds (ref 24–36)

## 2022-03-03 LAB — RESP PANEL BY RT-PCR (FLU A&B, COVID) ARPGX2
Influenza A by PCR: NEGATIVE
Influenza B by PCR: NEGATIVE
SARS Coronavirus 2 by RT PCR: NEGATIVE

## 2022-03-03 LAB — SEDIMENTATION RATE: Sed Rate: 30 mm/hr — ABNORMAL HIGH (ref 0–22)

## 2022-03-03 LAB — PROTIME-INR
INR: 1.1 (ref 0.8–1.2)
Prothrombin Time: 13.7 seconds (ref 11.4–15.2)

## 2022-03-03 MED ORDER — DIPHENHYDRAMINE HCL 50 MG/ML IJ SOLN
12.5000 mg | Freq: Once | INTRAMUSCULAR | Status: AC
  Administered 2022-03-03: 12.5 mg via INTRAVENOUS
  Filled 2022-03-03: qty 1

## 2022-03-03 MED ORDER — KETOROLAC TROMETHAMINE 30 MG/ML IJ SOLN
15.0000 mg | Freq: Once | INTRAMUSCULAR | Status: AC
  Administered 2022-03-03: 15 mg via INTRAVENOUS
  Filled 2022-03-03: qty 1

## 2022-03-03 MED ORDER — METOCLOPRAMIDE HCL 5 MG/ML IJ SOLN
10.0000 mg | Freq: Once | INTRAMUSCULAR | Status: AC
  Administered 2022-03-03: 10 mg via INTRAVENOUS
  Filled 2022-03-03: qty 2

## 2022-03-03 MED ORDER — IOHEXOL 350 MG/ML SOLN
100.0000 mL | Freq: Once | INTRAVENOUS | Status: AC | PRN
  Administered 2022-03-03: 75 mL via INTRAVENOUS

## 2022-03-03 NOTE — ED Notes (Signed)
Assisted pt in her WC and to BR for urine specimen Blue top redrawn @ 2230

## 2022-03-03 NOTE — ED Notes (Signed)
Blue top and gold top resent

## 2022-03-03 NOTE — Consult Note (Signed)
[facility] TeleNeurology Consult Services   Patient Name:   [name], [name] Date of Birth:   1955-11-24 Identification Number:   MRN - [id number] Date of Service:   03/03/2022 20:58:37  Diagnosis:       R20.2 - Paresthesia of skin  Impression:      66 year old female, history of hypertension, hyperlipidemia, coronary artery disease, bovine aortic valve replacement, polio with left leg paralysis, who is here with headache since last evening and new onset right cheek numbness at 2:30 p.m. today. Head CT is negative for acute abnormalities. She needs admission for a full stroke workup. Right greater than left sided headache with tenderness on palpation to the temple also raises the possibility of giant cell arteritis. 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    PLAN  - check ESR; if elevated, consult Ophtho for a dilated funduscopic exam and consider TA biopsy and steroids  - MRI brain w/o contrast  - TTE w/bubble  - check a1c and LDL  - monitor on tele for afib  - neuro to follow  - NeuroIR/Neurosurg consult re: R MCA aneurysm   ---  Our recommendations are outlined below.  Recommendations:        Stroke/Telemetry Floor       Neuro Checks       Bedside Swallow Eval       DVT Prophylaxis       IV Fluids, Normal Saline       Head of Bed 30 Degrees       Euglycemia and Avoid Hyperthermia (PRN Acetaminophen)   Sign Out:       Discussed with Emergency Department Provider    ------------------------------------------------------------------------------  Advanced Imaging: Advanced Imaging Deferred because:  Non-disabling symptoms as verified by the patient; no cortical signs so not consistent with LVO   Metrics: Last Known Well: 03/03/2022 14:30:00 [facility] Notification Time: 03/03/2022 20:58:37 Arrival Time: 03/03/2022 20:25:00 Stamp Time: 03/03/2022 20:58:37 Initial Response Time: 03/03/2022  21:01:18 Symptoms: right facial numbness. Initial patient interaction: 03/03/2022 21:05:30 NIHSS Assessment Completed: 03/03/2022 21:15:35 Patient is not a candidate for Thrombolytic. Thrombolytic Medical Decision: 03/03/2022 21:15:39 Patient was not deemed candidate for Thrombolytic because of following reasons: Last Well Known Above 4.5 Hours.  Primary Provider Notified of Diagnostic Impression and Management Plan on: 03/03/2022 21:29:39    ------------------------------------------------------------------------------  History of Present Illness: Patient is a 66 year old Female.  Patient was brought by private transportation with symptoms of right facial numbness.  66 year old female, history of hypertension, hyperlipidemia, coronary artery disease, bovine aortic valve replacement, polio with left leg paralysis, who was last known well at 2:30 p.m. today. She developed new onset right face numbness and reported a "feeling of novocaine" around her right cheek sparing her forehead. Last night she reported onset of a bad headache more so on the right side, which is tender to touch on the right temple. She also reports some brain fog and diffuse blurred vision. In the ER, her Stroke Scale is 6. Exam is notable for no effort versus gravity in the left leg secondary to polio, right leg weakness exacerbated by multiple hip surgeries, and right cheek numbness.   Past Medical History:      Hypertension      Hyperlipidemia      Coronary Artery Disease   aortic valve replacement   polio  No Anticoagulant use  Antiplatelet use: Yes asa Reviewed EMR for current medications  Allergies:  Reviewed  Social History: Drug Use: No  Family History: There is no family history of premature cerebrovascular disease pertinent to this consultation  ROS : 14 Points Review of Systems was performed and was negative except mentioned in HPI.     Examination: 1A: Level of Consciousness - Alert;  keenly responsive + 0 1B: Ask Month and Age - Both Questions Right + 0 1C: Blink Eyes & Squeeze Hands - Performs Both Tasks + 0 2: Test Horizontal Extraocular Movements - Normal + 0 3: Test Visual Fields - No Visual Loss + 0 4: Test Facial Palsy (Use Grimace if Obtunded) - Normal symmetry + 0 5A: Test Left Arm Motor Drift - No Drift for 10 Seconds + 0 5B: Test Right Arm Motor Drift - No Drift for 10 Seconds + 0 6A: Test Left Leg Motor Drift - No Effort Against Gravity + 3 6B: Test Right Leg Motor Drift - Some Effort Against Gravity + 2 7: Test Limb Ataxia (FNF/Heel-Shin) - No Ataxia + 0 8: Test Sensation - Mild-Moderate Loss: Less Sharp/More Dull + 1 9: Test Language/Aphasia - Normal; No aphasia + 0 10: Test Dysarthria - Normal + 0 11: Test Extinction/Inattention - No abnormality + 0  NIHSS Score: 6   Pre-Morbid Modified Rankin Scale: 4 Points = Moderately severe disability; unable to walk and attend to bodily needs without assistance  Spoke with : er doc  Patient/Family was informed the Neurology Consult would occur via TeleHealth consult by way of interactive audio and video telecommunications and consented to receiving care in this manner.   Patient is being evaluated for possible acute neurologic impairment and high probability of imminent or life-threatening deterioration. I spent total of 25 minutes providing care to this patient, including time for face to face visit via telemedicine, review of medical records, imaging studies and discussion of findings with providers, the patient and/or family.   Dr Burtis Junes   [facility] For Inpatient follow-up with [facility] physician please call [phone]. This is not an outpatient service. Post hospital discharge, please contact hospital directly.

## 2022-03-03 NOTE — ED Triage Notes (Signed)
Pt c/o headache with brain fog since yesterday with right sided facial numbness since 1430 today.

## 2022-03-03 NOTE — ED Provider Notes (Signed)
[facility] EMERGENCY DEPT Provider Note   CSN: [id number] Arrival date & time: 03/03/22  2025     History {Add pertinent medical, surgical, social history, OB history to HPI:1} Chief Complaint  Patient presents with   Numbness         [name] is a 66 y.o. female.  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  Describes severe headache that onset rather suddenly yesterday afternoon is become progressively worse though is now dull.  She has never had this kind of headache before.  About 2:30 PM she noticed numbness and tingling to the right side of her face" brain fog" feeling something was off".  Has had some blurry vision of the right eye.  Takes aspirin no other blood thinners.  Denies any weakness in her arms or her legs.  No chest pain or shortness of breath.  No cough, fever, runny nose or sore throat.  No vomiting.  Denies any room spinning dizziness.  She has never had this kind of headache before  The history is provided by the patient.       Home Medications Prior to Admission medications   Medication Sig Start Date End Date Taking? Authorizing Provider  ALPRAZolam Duanne Moron) 0.5 MG tablet Take 0.5 mg by mouth 2 (two) times daily as needed for sleep.     [provider]  aspirin EC 81 MG tablet Take 81 mg by mouth daily. Swallow whole.    [provider]  cetirizine (ZYRTEC) 10 MG tablet Take 10 mg by mouth as needed for allergies.    [provider]  Cholecalciferol 50 MCG (2000 UT) TABS Take 2,000 Units by mouth daily.     [provider]  cyclobenzaprine (FLEXERIL) 10 MG tablet Take 1 tablet (10 mg total) by mouth 2 (two) times daily as needed for muscle spasms. 01/10/22   Dorie Rank, MD  docusate sodium (COLACE) 100 MG capsule Take 200 mg by mouth daily.    [provider]  docusate sodium (COLACE) 100 MG  capsule Take 1 capsule (100 mg total) by mouth 2 (two) times daily. While taking narcotic pain medicine. 02/18/22   Corky Sing, PA-C  fluticasone [facility]) 50 MCG/ACT nasal spray Place 1 spray into both nostrils as needed for allergies or rhinitis.    [provider]  gabapentin (NEURONTIN) 300 MG capsule Take 300 mg by mouth 2 (two) times daily.    [provider]  lidocaine (LIDODERM) 5 % Place 1 patch onto the skin daily. Remove & Discard patch within 12 hours or as directed by MD 01/10/22   Dorie Rank, MD  Loteprednol Etabonate (EYSUVIS) 0.25 % SUSP Place 1 drop into both eyes 3 (three) times a week.    [provider]  ondansetron (ZOFRAN) 4 MG tablet Take 1 tablet (4 mg total) by mouth every 8 (eight) hours as needed for nausea or vomiting. 02/18/22   Corky Sing, PA-C  pantoprazole (PROTONIX) 40 MG tablet Take 40 mg by mouth daily.    [provider]  Propylene Glycol (SYSTANE BALANCE) 0.6 % SOLN Place 1 drop into both eyes in the morning, at noon, in the evening, and at bedtime.    [provider]  rosuvastatin (CRESTOR) 20 MG tablet Take 1 tablet (20 mg total) by mouth daily. 12/21/21   [facility], Tami Lin, PA  senna (SENOKOT) 8.6 MG TABS tablet Take 2 tablets (  17.2 mg total) by mouth 2 (two) times daily. 02/18/22   Corky Sing, PA-C  traZODone (DESYREL) 150 MG tablet Take 150 mg by mouth at bedtime.    [provider]  venlafaxine XR (EFFEXOR-XR) 75 MG 24 hr capsule Take 75 mg by mouth daily with breakfast.    [provider]  vitamin B-12 (CYANOCOBALAMIN) 250 MCG tablet Take 250 mcg by mouth daily.    [provider]      Allergies    Amoxicillin, Flagyl [metronidazole], Plaquenil [hydroxychloroquine sulfate], Codeine, Doxycycline, Other, Percocet [oxycodone-acetaminophen], and Sulfa antibiotics    Review of Systems   Review of Systems  Eyes:  Positive for visual disturbance.  Genitourinary:   Frequency: .ro.  Neurological:  Positive for dizziness, weakness, numbness and headaches.   all other systems are negative except as noted in the HPI and PMH.    Physical Exam Updated Vital Signs BP (!) 171/98 (BP Location: Left Arm)   Pulse 80   Temp 98.6 F (37 C)   Resp 18   Ht $R'5\' 8"'td$  (1.727 m)   Wt 117.9 kg   SpO2 97%   BMI 39.53 kg/m  Physical Exam Vitals and nursing note reviewed.  Constitutional:      General: She is not in acute distress.    Appearance: She is well-developed.  HENT:     Head: Normocephalic.     Comments: Tender to palpation right temple    Mouth/Throat:     Pharynx: No oropharyngeal exudate.  Eyes:     Conjunctiva/sclera: Conjunctivae normal.     Pupils: Pupils are equal, round, and reactive to light.  Neck:     Comments: No meningismus. Cardiovascular:     Rate and Rhythm: Normal rate and regular rhythm.     Heart sounds: Normal heart sounds. No murmur heard. Pulmonary:     Effort: Pulmonary effort is normal. No respiratory distress.     Breath sounds: Normal breath sounds.  Abdominal:     Palpations: Abdomen is soft.     Tenderness: There is no abdominal tenderness. There is no guarding or rebound.  Musculoskeletal:        General: No tenderness. Normal range of motion.     Cervical back: Normal range of motion and neck supple.  Skin:    General: Skin is warm.  Neurological:     Mental Status: She is alert and oriented to person, place, and time.     Cranial Nerves: No cranial nerve deficit.     Motor: No abnormal muscle tone.     Coordination: Coordination normal.     Comments: Wheelchair-bound, lower extremity weakness at baseline. LLE weakness at baseline Cranial nerves II to XII intact, no facial asymmetry.  Tongue is midline.  Subjective paresthesias involving right side of her face.  5/5 strength throughout, no ataxia on finger-to-nose.  No pronator drift.  Psychiatric:        Behavior: Behavior normal.     ED Results /  Procedures / Treatments   Labs (all labs ordered are listed, but only abnormal results are displayed) Labs Reviewed  DIFFERENTIAL - Abnormal; Notable for the following components:      Result Value   Eosinophils Absolute 0.9 (*)    All other components within normal limits  COMPREHENSIVE METABOLIC PANEL - Abnormal; Notable for the following components:   Glucose, Bld 122 (*)    Calcium 8.8 (*)    All other components within normal limits  SEDIMENTATION RATE -  Abnormal; Notable for the following components:   Sed Rate 30 (*)    All other components within normal limits  CBG MONITORING, ED - Abnormal; Notable for the following components:   Glucose-Capillary 129 (*)    All other components within normal limits  RESP PANEL BY RT-PCR (FLU A&B, COVID) ARPGX2  ETHANOL  CBC  URINALYSIS, ROUTINE W REFLEX MICROSCOPIC  RAPID URINE DRUG SCREEN, HOSP PERFORMED  C-REACTIVE PROTEIN  PROTIME-INR  APTT    EKG None  Radiology CT ANGIO HEAD NECK W WO CM  Result Date: 03/03/2022 CLINICAL DATA:  Initial evaluation for headache, brain fog. Facial numbness. EXAM: CT ANGIOGRAPHY HEAD AND NECK TECHNIQUE: Multidetector CT imaging of the head and neck was performed using the standard protocol during bolus administration of intravenous contrast. Multiplanar CT image reconstructions and MIPs were obtained to evaluate the vascular anatomy. Carotid stenosis measurements (when applicable) are obtained utilizing NASCET criteria, using the distal internal carotid diameter as the denominator. RADIATION DOSE REDUCTION: This exam was performed according to the departmental dose-optimization program which includes automated exposure control, adjustment of the mA and/or kV according to patient size and/or use of iterative reconstruction technique. CONTRAST:  47mL OMNIPAQUE IOHEXOL 350 MG/ML SOLN COMPARISON:  CT from earlier the same day. FINDINGS: CTA NECK FINDINGS Aortic arch: Visualized aortic arch normal in caliber.  Bovine branching pattern noted. No stenosis about the origin of the great vessels. Right carotid system: Right common and internal carotid arteries are patent without stenosis, dissection or occlusion. Left carotid system: Left common and internal carotid arteries are patent without stenosis, dissection or occlusion. Vertebral arteries: Both vertebral arteries arise from subclavian arteries. No proximal subclavian artery stenosis. Vertebral arteries patent without stenosis, dissection or occlusion. Skeleton: No discrete or worrisome osseous lesions. Moderate spondylosis present at C4-5 through C6-7. Degenerative changes noted about the right TMJ. Other neck: No other acute soft tissue abnormality within the neck. Upper chest: Visualized upper chest demonstrates no acute finding. Review of the MIP images confirms the above findings CTA HEAD FINDINGS Anterior circulation: Both internal carotid arteries patent to the termini without stenosis. A1 segments patent bilaterally. Negative anterior communicating artery complex. Both ACAs patent to their distal aspects without stenosis. No M1 stenosis or occlusion. 5-6 mm saccular aneurysm seen arising from the right MCA bifurcation (series 8, image 103). No proximal MCA branch occlusion. Distal MCA branches perfused and symmetric. Posterior circulation: Both V4 segments patent without stenosis. Both PICA patent. Basilar patent to its distal aspect without stenosis. Superior cerebral arteries patent bilaterally. Both PCAs primarily supplied via the basilar well perfused or distal aspects. Venous sinuses: Patent allowing for timing the contrast bolus. DVA noted at the right cerebellum. Anatomic variants: None significant. Review of the MIP images confirms the above findings IMPRESSION: 1. Negative CTA of the head and neck for large vessel occlusion or other emergent finding. No significant atheromatous disease for age. No hemodynamically significant or correctable stenosis. 2.  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 Electronically Signed   By: Jeannine Boga M.D.   On: 03/03/2022 22:00   CT HEAD CODE STROKE WO CONTRAST  Result Date: 03/03/2022 CLINICAL DATA:  Code stroke. Initial evaluation for neuro deficit, stroke suspected. EXAM: CT HEAD WITHOUT CONTRAST TECHNIQUE: Contiguous axial images were obtained from the base of the skull through the vertex without intravenous contrast. RADIATION DOSE REDUCTION: This exam was performed according to the departmental dose-optimization program which includes automated exposure control, adjustment of the mA and/or  kV according to patient size and/or use of iterative reconstruction technique. COMPARISON:  None Available. FINDINGS: Brain: Cerebral volume within normal limits. No acute intracranial hemorrhage. No acute large vessel territory infarct. No mass lesion, midline shift or mass effect. No hydrocephalus or extra-axial fluid collection. Vascular: No abnormal hyperdense vessel. Skull: Scalp soft tissues and calvarium within normal limits. Sinuses/Orbits: Globes orbital soft tissues within normal limits. Paranasal sinuses are largely clear. No mastoid effusion. Other: None. ASPECTS [facility] Stroke Program Early CT Score) - Ganglionic level infarction (caudate, lentiform nuclei, internal capsule, insula, M1-M3 cortex): 7 - Supraganglionic infarction (M4-M6 cortex): 3 Total score (0-10 with 10 being normal): 10 IMPRESSION: 1. No acute intracranial abnormality. 2. ASPECTS is 10. Critical Value/emergent results were called by telephone at the time of interpretation on 03/03/2022 at 9:01 pm to provider [facility] , who verbally acknowledged these results. Electronically Signed   By: Jeannine Boga M.D.   On: 03/03/2022 21:02    Procedures Procedures  {Document cardiac monitor, telemetry assessment procedure when appropriate:1}  Medications Ordered in ED Medications - No data to display  ED Course/  Medical Decision Making/ A&P                           Medical Decision Making Amount and/or Complexity of Data Reviewed Labs: ordered. Decision-making details documented in ED Course. Radiology: ordered and independent interpretation performed. Decision-making details documented in ED Course. ECG/medicine tests: ordered and independent interpretation performed. Decision-making details documented in ED Course.  Risk Prescription drug management. Decision regarding hospitalization.   Patient with severe headache since yesterday.  Now with right-sided facial numbness since 2:30 PM associated with "brain fog" and dizziness.  Code stroke activated on arrival.  She is likely not a lytic candidate due to delay in presentation  CT head shows no hemorrhage.  Discussed with Dr. Jeannine Boga of radiology.  Patient seen as code stroke with Dr. Truman Hayward of neurology.  He agrees patient needs to come in for MRI and stroke work-up.  She is not a lytic candidate due to minimal deficits and delay in presentation.  CTA will be obtained to assess for large vessel occlusion.  There was also some concern for possible giant cell arteritis given her right-sided headache and tenderness across her right temple.  Will check ESR.  Also treat empirically for possible complicated migraine  CTA is negative for large vessel occlusion or stenosis but does show right-sided 5 mm saccular aneurysm of MCA.  No evidence of hemorrhage.  Headache has improved with Toradol, Reglan and Benadryl    {Document critical care time when appropriate:1} {Document review of labs and clinical decision tools ie heart score, Chads2Vasc2 etc:1}  {Document your independent review of radiology images, and any outside records:1} {Document your discussion with family members, caretakers, and with consultants:1} {Document social determinants of health affecting pt's care:1} {Document your decision making why or why not admission, treatments  were needed:1} Final Clinical Impression(s) / ED Diagnoses Final diagnoses:  None    Rx / DC Orders ED Discharge Orders     None

## 2022-03-03 NOTE — ED Notes (Signed)
[facility] called for pt transport to [place], floor has been called

## 2022-03-03 NOTE — Progress Notes (Signed)
Code stroke activated at 2055, patient in CT.  MRS 2.  Returned at 2100. Dr. Nicoletta Dress connected at 2101. CT results communicated to EDP by rad per reports.   Callie Fielding, [facility] RN

## 2022-03-04 ENCOUNTER — Observation Stay (HOSPITAL_BASED_OUTPATIENT_CLINIC_OR_DEPARTMENT_OTHER): Payer: Medicare HMO

## 2022-03-04 ENCOUNTER — Observation Stay (HOSPITAL_COMMUNITY): Payer: Medicare HMO

## 2022-03-04 DIAGNOSIS — E669 Obesity, unspecified: Secondary | ICD-10-CM

## 2022-03-04 DIAGNOSIS — Z9989 Dependence on other enabling machines and devices: Secondary | ICD-10-CM | POA: Diagnosis not present

## 2022-03-04 DIAGNOSIS — R200 Anesthesia of skin: Secondary | ICD-10-CM | POA: Diagnosis not present

## 2022-03-04 DIAGNOSIS — I350 Nonrheumatic aortic (valve) stenosis: Secondary | ICD-10-CM

## 2022-03-04 DIAGNOSIS — E785 Hyperlipidemia, unspecified: Secondary | ICD-10-CM

## 2022-03-04 DIAGNOSIS — I2510 Atherosclerotic heart disease of native coronary artery without angina pectoris: Secondary | ICD-10-CM | POA: Diagnosis not present

## 2022-03-04 DIAGNOSIS — G43109 Migraine with aura, not intractable, without status migrainosus: Secondary | ICD-10-CM

## 2022-03-04 DIAGNOSIS — Z79899 Other long term (current) drug therapy: Secondary | ICD-10-CM | POA: Diagnosis not present

## 2022-03-04 DIAGNOSIS — G459 Transient cerebral ischemic attack, unspecified: Secondary | ICD-10-CM

## 2022-03-04 DIAGNOSIS — G4733 Obstructive sleep apnea (adult) (pediatric): Secondary | ICD-10-CM

## 2022-03-04 DIAGNOSIS — Z96641 Presence of right artificial hip joint: Secondary | ICD-10-CM | POA: Diagnosis not present

## 2022-03-04 DIAGNOSIS — Z7982 Long term (current) use of aspirin: Secondary | ICD-10-CM | POA: Diagnosis not present

## 2022-03-04 DIAGNOSIS — Z96659 Presence of unspecified artificial knee joint: Secondary | ICD-10-CM | POA: Diagnosis not present

## 2022-03-04 DIAGNOSIS — I5032 Chronic diastolic (congestive) heart failure: Secondary | ICD-10-CM | POA: Diagnosis not present

## 2022-03-04 DIAGNOSIS — R29818 Other symptoms and signs involving the nervous system: Secondary | ICD-10-CM | POA: Diagnosis not present

## 2022-03-04 DIAGNOSIS — Z20822 Contact with and (suspected) exposure to covid-19: Secondary | ICD-10-CM | POA: Diagnosis not present

## 2022-03-04 LAB — HEMOGLOBIN A1C
Hgb A1c MFr Bld: 6.2 % — ABNORMAL HIGH (ref 4.8–5.6)
Mean Plasma Glucose: 131.24 mg/dL

## 2022-03-04 LAB — LIPID PANEL
Cholesterol: 127 mg/dL (ref 0–200)
HDL: 39 mg/dL — ABNORMAL LOW (ref >40)
LDL Cholesterol: 44 mg/dL (ref 0–99)
Total CHOL/HDL Ratio: 3.3 RATIO
Triglycerides: 222 mg/dL — ABNORMAL HIGH (ref <150)
VLDL: 44 mg/dL — ABNORMAL HIGH (ref 0–40)

## 2022-03-04 LAB — HIV ANTIBODY (ROUTINE TESTING W REFLEX): HIV Screen 4th Generation wRfx: NONREACTIVE

## 2022-03-04 LAB — C-REACTIVE PROTEIN: CRP: 1.4 mg/dL — ABNORMAL HIGH (ref <1.0)

## 2022-03-04 LAB — ECHOCARDIOGRAM COMPLETE BUBBLE STUDY
AR max vel: 1.62 cm2
AV Area VTI: 1.59 cm2
AV Area mean vel: 1.39 cm2
AV Mean grad: 14.5 mmHg
AV Peak grad: 27.1 mmHg
Ao pk vel: 2.61 m/s
Area-P 1/2: 4.19 cm2
S' Lateral: 2.6 cm

## 2022-03-04 MED ORDER — ASPIRIN 81 MG PO TBEC
81.0000 mg | DELAYED_RELEASE_TABLET | Freq: Every day | ORAL | Status: DC
  Administered 2022-03-04 – 2022-03-05 (×2): 81 mg via ORAL
  Filled 2022-03-04 (×2): qty 1

## 2022-03-04 MED ORDER — ROSUVASTATIN CALCIUM 20 MG PO TABS: 20.0000 mg | ORAL_TABLET | Freq: Every day | ORAL | 3 refills | Status: DC

## 2022-03-04 MED ORDER — GABAPENTIN 300 MG PO CAPS
300.0000 mg | ORAL_CAPSULE | Freq: Two times a day (BID) | ORAL | Status: DC
  Administered 2022-03-04 – 2022-03-05 (×3): 300 mg via ORAL
  Filled 2022-03-04 (×3): qty 1

## 2022-03-04 MED ORDER — LORAZEPAM 2 MG/ML IJ SOLN
0.5000 mg | Freq: Once | INTRAMUSCULAR | Status: AC
  Administered 2022-03-04: 0.5 mg via INTRAVENOUS
  Filled 2022-03-04: qty 1

## 2022-03-04 MED ORDER — PANTOPRAZOLE SODIUM 40 MG PO TBEC
40.0000 mg | DELAYED_RELEASE_TABLET | Freq: Every day | ORAL | Status: DC
  Administered 2022-03-04 – 2022-03-05 (×2): 40 mg via ORAL
  Filled 2022-03-04 (×2): qty 1

## 2022-03-04 MED ORDER — SODIUM CHLORIDE 0.9 % IV SOLN: INTRAVENOUS | Status: AC

## 2022-03-04 MED ORDER — ROSUVASTATIN CALCIUM 20 MG PO TABS
20.0000 mg | ORAL_TABLET | Freq: Every day | ORAL | Status: DC
  Administered 2022-03-04 – 2022-03-05 (×2): 20 mg via ORAL
  Filled 2022-03-04 (×2): qty 1

## 2022-03-04 MED ORDER — CYANOCOBALAMIN 500 MCG PO TABS
250.0000 ug | ORAL_TABLET | Freq: Every day | ORAL | Status: DC
  Administered 2022-03-04 – 2022-03-05 (×2): 250 ug via ORAL
  Filled 2022-03-04 (×2): qty 1

## 2022-03-04 MED ORDER — FLUTICASONE PROPIONATE 50 MCG/ACT NA SUSP: 1.0000 | NASAL | Status: DC | PRN

## 2022-03-04 MED ORDER — PROCHLORPERAZINE EDISYLATE 10 MG/2ML IJ SOLN: 5.0000 mg | Freq: Four times a day (QID) | INTRAMUSCULAR | Status: DC | PRN

## 2022-03-04 MED ORDER — VITAMIN D 25 MCG (1000 UNIT) PO TABS
2000.0000 [IU] | ORAL_TABLET | Freq: Every day | ORAL | Status: DC
  Administered 2022-03-05: 2000 [IU] via ORAL
  Filled 2022-03-04: qty 2

## 2022-03-04 MED ORDER — ACETAMINOPHEN 325 MG PO TABS: 650.0000 mg | ORAL_TABLET | Freq: Four times a day (QID) | ORAL | Status: DC | PRN

## 2022-03-04 MED ORDER — POLYETHYLENE GLYCOL 3350 17 G PO PACK: 17.0000 g | PACK | Freq: Every day | ORAL | Status: DC | PRN

## 2022-03-04 MED ORDER — VENLAFAXINE HCL ER 75 MG PO CP24
75.0000 mg | ORAL_CAPSULE | Freq: Every day | ORAL | Status: DC
  Administered 2022-03-04 – 2022-03-05 (×2): 75 mg via ORAL
  Filled 2022-03-04 (×2): qty 1

## 2022-03-04 MED ORDER — TRAZODONE HCL 50 MG PO TABS
150.0000 mg | ORAL_TABLET | Freq: Every day | ORAL | Status: DC
  Administered 2022-03-04: 150 mg via ORAL
  Filled 2022-03-04: qty 3

## 2022-03-04 MED ORDER — ALPRAZOLAM 0.5 MG PO TABS: 0.5000 mg | ORAL_TABLET | Freq: Two times a day (BID) | ORAL | Status: DC | PRN

## 2022-03-04 NOTE — Plan of Care (Signed)
  Problem: Education: Goal: Knowledge of General Education information will improve Description: Including pain rating scale, medication(s)/side effects and non-pharmacologic comfort measures 03/04/2022 1519 by Rosey Bath, RN Outcome: Adequate for Discharge 03/04/2022 1519 by Rosey Bath, RN Outcome: Adequate for Discharge   Problem: Health Behavior/Discharge Planning: Goal: Ability to manage health-related needs will improve 03/04/2022 1519 by Rosey Bath, RN Outcome: Adequate for Discharge 03/04/2022 1519 by Rosey Bath, RN Outcome: Adequate for Discharge   Problem: Clinical Measurements: Goal: Ability to maintain clinical measurements within normal limits will improve 03/04/2022 1519 by Rosey Bath, RN Outcome: Adequate for Discharge 03/04/2022 1519 by Rosey Bath, RN Outcome: Adequate for Discharge Goal: Will remain free from infection 03/04/2022 1519 by Rosey Bath, RN Outcome: Adequate for Discharge 03/04/2022 1519 by Rosey Bath, RN Outcome: Adequate for Discharge Goal: Diagnostic test results will improve 03/04/2022 1519 by Rosey Bath, RN Outcome: Adequate for Discharge 03/04/2022 1519 by Rosey Bath, RN Outcome: Adequate for Discharge Goal: Respiratory complications will improve 03/04/2022 1519 by Rosey Bath, RN Outcome: Adequate for Discharge 03/04/2022 1519 by Rosey Bath, RN Outcome: Adequate for Discharge Goal: Cardiovascular complication will be avoided 03/04/2022 1519 by Rosey Bath, RN Outcome: Adequate for Discharge 03/04/2022 1519 by Rosey Bath, RN Outcome: Adequate for Discharge   Problem: Activity: Goal: Risk for activity intolerance will decrease 03/04/2022 1519 by Rosey Bath, RN Outcome: Adequate for Discharge 03/04/2022 1519 by Rosey Bath, RN Outcome: Adequate for Discharge   Problem: Nutrition: Goal: Adequate nutrition will be maintained 03/04/2022 1519 by Rosey Bath,  RN Outcome: Adequate for Discharge 03/04/2022 1519 by Rosey Bath, RN Outcome: Adequate for Discharge   Problem: Coping: Goal: Level of anxiety will decrease 03/04/2022 1519 by Rosey Bath, RN Outcome: Adequate for Discharge 03/04/2022 1519 by Rosey Bath, RN Outcome: Adequate for Discharge   Problem: Elimination: Goal: Will not experience complications related to bowel motility 03/04/2022 1519 by Rosey Bath, RN Outcome: Adequate for Discharge 03/04/2022 1519 by Rosey Bath, RN Outcome: Adequate for Discharge Goal: Will not experience complications related to urinary retention 03/04/2022 1519 by Rosey Bath, RN Outcome: Adequate for Discharge 03/04/2022 1519 by Rosey Bath, RN Outcome: Adequate for Discharge   Problem: Pain Managment: Goal: General experience of comfort will improve 03/04/2022 1519 by Rosey Bath, RN Outcome: Adequate for Discharge 03/04/2022 1519 by Rosey Bath, RN Outcome: Adequate for Discharge   Problem: Safety: Goal: Ability to remain free from injury will improve 03/04/2022 1519 by Rosey Bath, RN Outcome: Adequate for Discharge 03/04/2022 1519 by Rosey Bath, RN Outcome: Adequate for Discharge   Problem: Skin Integrity: Goal: Risk for impaired skin integrity will decrease 03/04/2022 1519 by Rosey Bath, RN Outcome: Adequate for Discharge 03/04/2022 1519 by Rosey Bath, RN Outcome: Adequate for Discharge

## 2022-03-04 NOTE — Progress Notes (Signed)
  Transition of Care [facility]) Screening Note   Patient Details  Name: [name] Date of Birth: [birth year]   Transition of [facility]) CM/SW Contact:    Cyndi Bender, RN Phone Number: 03/04/2022, 9:41 AM    Transition of Care Department [facility]) has reviewed patient and no TOC needs have been identified at this time. We will continue to monitor patient advancement through interdisciplinary progression rounds. If new patient transition needs arise, please place a TOC consult.

## 2022-03-04 NOTE — Care Management Obs Status (Signed)
[place] NOTIFICATION   Patient Details  Name: TANEQUA KRETZ MRN: [id number] Date of Birth: 1956-05-03   Medicare Observation Status Notification Given:  Yes    Verdell Carmine, RN 03/04/2022, 10:49 AM

## 2022-03-04 NOTE — Plan of Care (Signed)

## 2022-03-04 NOTE — Evaluation (Signed)
Clinical/Bedside Swallow Evaluation Patient Details  Name: KEIGAN GIRTEN MRN: [id number] Date of Birth: Nov 15, 1955  Today's Date: 03/04/2022 Time: SLP Start Time (ACUTE ONLY): 68 SLP Stop Time (ACUTE ONLY): 30 SLP Time Calculation (min) (ACUTE ONLY): 35 min  Past Medical History:  Past Medical History:  Diagnosis Date   Allergy    seasonal allergies   Anxiety    on meds   Arthritis    low back issues   Carpal tunnel syndrome    LEFT   Cataract    bilateral -sx   Cervical pain (neck)    Complication of anesthesia    DIFFICULTY WAKING UP   Coronary artery disease    Depression    on meds   GERD (gastroesophageal reflux disease)    on meds   Hyperlipidemia    diet controlled- not on meds at this time (08/05/2020)   Insomnia    Mass of right lower leg    Morbid obesity (HCC)    OSA on CPAP    uses CPAP nightly   Osteopenia    neck   PONV (postoperative nausea and vomiting)    Post-polio syndrome    With left leg paralysis   S/P TAVR (transcatheter aortic valve replacement)    Sigmoid diverticulosis    Ulnar nerve abnormality    Past Surgical History:  Past Surgical History:  Procedure Laterality Date   AMPUTATION TOE Left 02/18/2022   Procedure: Second and Third distal interphalangeal and Fourth proximal interphalangeal  toe amputations;  Surgeon: Wylene Simmer, MD;  Location: [place];  Service: Orthopedics;  Laterality: Left;   ANKLE FUSION     left   APPENDECTOMY     BIOPSY  05/04/2021   Procedure: BIOPSY;  Surgeon: Rush Landmark Telford Nab., MD;  Location: [facility];  Service: Gastroenterology;;   CHOLECYSTECTOMY     COLONOSCOPY     COLONOSCOPY N/A 05/04/2021   Procedure: COLONOSCOPY;  Surgeon: Irving Copas., MD;  Location: [facility];  Service: Gastroenterology;  Laterality: N/A;   ESOPHAGOGASTRODUODENOSCOPY (EGD) WITH PROPOFOL N/A 05/04/2021   Procedure: ESOPHAGOGASTRODUODENOSCOPY (EGD) WITH PROPOFOL;  Surgeon:  Rush Landmark Telford Nab., MD;  Location: [facility];  Service: Gastroenterology;  Laterality: N/A;   EYE SURGERY Bilateral 2020   cataracts removed August and September 2020   JOINT REPLACEMENT Right 2003   three replacements; 2003 is last one   KNEE ARTHROPLASTY     LEG SURGERY     muscle surgery related to polio   MASS EXCISION Right 01/08/2020   Procedure: EXCISION RIGHT LEG MASS;  Surgeon: Erroll Luna, MD;  Location: [place];  Service: General;  Laterality: Right;   POLYPECTOMY  05/04/2021   Procedure: POLYPECTOMY;  Surgeon: Irving Copas., MD;  Location: [facility];  Service: Gastroenterology;;   RECTAL PROLAPSE REPAIR  2020   pessary placed   RIGHT/LEFT HEART CATH AND CORONARY ANGIOGRAPHY N/A 04/02/2019   Procedure: RIGHT/LEFT HEART CATH AND CORONARY ANGIOGRAPHY;  Surgeon: Nelva Bush, MD;  Location: [place] [facility];  Service: Cardiovascular;  Laterality: N/A;   SAVORY DILATION N/A 05/04/2021   Procedure: SAVORY DILATION;  Surgeon: Rush Landmark Telford Nab., MD;  Location: [facility];  Service: Gastroenterology;  Laterality: N/A;   TEE WITHOUT CARDIOVERSION N/A 04/17/2019   Procedure: TRANSESOPHAGEAL ECHOCARDIOGRAM (TEE);  Surgeon: Sherren Mocha, MD;  Location: [place] [facility];  Service: Open Heart Surgery;  Laterality: N/A;   TOTAL HIP ARTHROPLASTY     right x 3   TRANSCATHETER  AORTIC VALVE REPLACEMENT, TRANSFEMORAL N/A 04/17/2019   Procedure: TRANSCATHETER AORTIC VALVE REPLACEMENT, TRANSFEMORAL;  Surgeon: Sherren Mocha, MD;  Location: [place] [facility];  Service: Open Heart Surgery;  Laterality: N/A;   UMBILICAL HERNIA REPAIR     HPI:  66 y.o. 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 Doran Durand, who  initially presented to [facility] ED with complaints of right-sided facial numbness since 1430 on 03/03/2022.  Associated with headache and brain fog with onset the day before; 03/04/22 MRI head revealed No acute finding.  Negative for infarct.  2. Right MCA aneurysm, see prior CTA; BSE generated; pt NPO.    Assessment / Plan / Recommendation  Clinical Impression  Pt seen for clinical swallowing assessment with normal oropharyngeal swallow noted with solids/thin liquids despite xerostomia present prior to PO intake.  Pt stated that this is "normal" d/t medication side effects.  Pt encouraged to moisten oral mucosa prior to PO intake to assist with transitioning from oral cavity into pharynx/esophagus without issues.  Pt in agreement and institutes these precautions at home independently prn.  Recommend initiating a regular/thin liquid diet with general safety precautions in place.  ST will s/o in acute setting. SLP Visit Diagnosis: Dysphagia, unspecified (R13.10)    Aspiration Risk  No limitations    Diet Recommendation   Regular/thin liquids  Medication Administration: Whole meds with liquid    Other  Recommendations Oral Care Recommendations: Oral care BID;Patient independent with oral care    Recommendations for follow up therapy are one component of a multi-disciplinary discharge planning process, led by the attending physician.  Recommendations may be updated based on patient status, additional functional criteria and insurance authorization.  Follow up Recommendations No SLP follow up      Assistance Recommended at Discharge None  Functional Status Assessment Patient has not had a recent decline in their functional status  Frequency and Duration  (evaluation only)          Prognosis Prognosis for Safe Diet Advancement: Good      Swallow Study   General Date of Onset: 03/03/22 HPI: 66 y.o. 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 Doran Durand, who initially presented to [facility] ED with complaints of right-sided facial numbness since 1430 on 03/03/2022.  Associated with headache and brain fog with onset the day before; 03/04/22 MRI head revealed No acute finding.  Negative for infarct.  2. Right MCA aneurysm, see prior CTA; BSE generated; pt NPO. Type of Study: Bedside Swallow Evaluation Previous Swallow Assessment: MBS 08/19/2006, but results unavailable in EPIC. Diet Prior to this Study: NPO Temperature Spikes Noted: No Respiratory Status: Room air History of Recent Intubation: No Behavior/Cognition: Alert;Cooperative;Pleasant mood Oral Cavity Assessment: Dry Oral Care Completed by SLP: No Oral Cavity - Dentition: Adequate natural dentition Vision: Functional for self-feeding Self-Feeding Abilities: Able to feed self Patient Positioning: Upright in bed Baseline Vocal Quality: Normal Volitional Cough: Strong Volitional Swallow: Able to elicit    Oral/Motor/Sensory Function Overall Oral Motor/Sensory Function: Other (comment) (Pt c/o R facial numbness/parasthesia, but functional for swallowing/speech purposes)   Ice Chips Ice chips: Not tested   Thin  Liquid Thin Liquid: Within functional limits Presentation: Cup;Straw    Nectar Thick Nectar Thick Liquid: Not tested   Honey Thick Honey Thick Liquid: Not tested   Puree Puree: Not tested   Solid     Solid: Within functional limits Presentation: Self Fed      Elvina Sidle, M.S., CCC-SLP 03/04/2022,12:30 PM

## 2022-03-04 NOTE — ED Notes (Signed)
Report given to Payton Emerald RN

## 2022-03-04 NOTE — Progress Notes (Signed)
PT Cancellation Note  Patient Details Name: [name] MRN: [id number] DOB: June 10, 1956   Cancelled Treatment:    Reason Eval/Treat Not Completed: OT screened, no needs identified, PT will sign off. 03/04/2022  Ginger Carne., PT Acute Rehabilitation Services [phone]  (office)   Tessie Fass Siara Gorder 03/04/2022, 3:24 PM

## 2022-03-04 NOTE — Consult Note (Signed)
Stroke Neurology Consultation Note  Consult Requested by: Dr. Waldron Labs  Reason for Consult: ? TIA  Consult Date: 03/04/22   The history was obtained from the patient and husband.  During history and examination, all items were able to obtain unless otherwise noted.  History of Present Illness:  [name] is a 66 y.o. 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  Per patient, on Tuesday afternoon, she had a severe headache bilateral mid frontal, she took 2 Tylenol and went to bed and slept overnight.  Wednesday (yesterday) headache little better but still very groggy.  In the afternoon 2:30 PM, she was driving, had a sudden onset right cheek numbness, right eye hazy vision, pulling and rolling sensation of the right eye.  Denies speech difficulty, arm or leg weakness.  She went to [facility] ED, had telemetry neurology evaluation and sent over to [facility] for further evaluation.  Overnight, headache getting better, especially after Ativan for MRI and she had a sleep after.  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  MRI showed no acute infarct.  LDL 44, A1c 6.2, UDS negative.  ESR 30, CRP 1.4.  Patient denies history of migraine however she does have frequent bilateral temporal headache, once a week, did not last long, pressing on the temporal area helps, sometimes takes Tylenol.  She had a severe aortic stenosis status post TAVR 04/2019, started Crestor but was told LDL not too bad.  She had a left leg polio, plus right hip surgery x3, currently she is wheelchair-bound.  LSN: 2:30 PM yesterday tPA Given: No: Outside window  Past Medical History:  Diagnosis Date   Allergy    seasonal allergies   Anxiety    on meds   Arthritis    low back  issues   Carpal tunnel syndrome    LEFT   Cataract    bilateral -sx   Cervical pain (neck)    Complication of anesthesia    DIFFICULTY WAKING UP   Coronary artery disease    Depression    on meds   GERD (gastroesophageal reflux disease)    on meds   Hyperlipidemia    diet controlled- not on meds at this time (08/05/2020)   Insomnia    Mass of right lower leg    Morbid obesity (HCC)    OSA on CPAP    uses CPAP nightly   Osteopenia    neck   PONV (postoperative nausea and vomiting)    Post-polio syndrome    With left leg paralysis   S/P TAVR (transcatheter aortic valve replacement)    Sigmoid diverticulosis    Ulnar nerve abnormality     Past Surgical History:  Procedure Laterality Date   AMPUTATION TOE Left 02/18/2022   Procedure: Second and Third distal interphalangeal and Fourth proximal interphalangeal  toe amputations;  Surgeon: Wylene Simmer, MD;  Location: [place];  Service: Orthopedics;  Laterality: Left;   ANKLE FUSION     left   APPENDECTOMY     BIOPSY  05/04/2021   Procedure: BIOPSY;  Surgeon: Irving Copas., MD;  Location: [facility];  Service: Gastroenterology;;   CHOLECYSTECTOMY     COLONOSCOPY     COLONOSCOPY N/A 05/04/2021   Procedure:  COLONOSCOPY;  Surgeon: Rush Landmark Telford Nab., MD;  Location: [facility];  Service: Gastroenterology;  Laterality: N/A;   ESOPHAGOGASTRODUODENOSCOPY (EGD) WITH PROPOFOL N/A 05/04/2021   Procedure: ESOPHAGOGASTRODUODENOSCOPY (EGD) WITH PROPOFOL;  Surgeon: Rush Landmark Telford Nab., MD;  Location: [facility];  Service: Gastroenterology;  Laterality: N/A;   EYE SURGERY Bilateral 2020   cataracts removed August and September 2020   JOINT REPLACEMENT Right 2003   three replacements; 2003 is last one   KNEE ARTHROPLASTY     LEG SURGERY     muscle surgery related to polio   MASS EXCISION Right 01/08/2020   Procedure: EXCISION RIGHT LEG MASS;  Surgeon: Erroll Luna, MD;  Location: [place];  Service: General;  Laterality: Right;   POLYPECTOMY  05/04/2021   Procedure: POLYPECTOMY;  Surgeon: Irving Copas., MD;  Location: [facility];  Service: Gastroenterology;;   RECTAL PROLAPSE REPAIR  2020   pessary placed   RIGHT/LEFT HEART CATH AND CORONARY ANGIOGRAPHY N/A 04/02/2019   Procedure: RIGHT/LEFT HEART CATH AND CORONARY ANGIOGRAPHY;  Surgeon: Nelva Bush, MD;  Location: [place] [facility];  Service: Cardiovascular;  Laterality: N/A;   SAVORY DILATION N/A 05/04/2021   Procedure: SAVORY DILATION;  Surgeon: Rush Landmark Telford Nab., MD;  Location: [facility];  Service: Gastroenterology;  Laterality: N/A;   TEE WITHOUT CARDIOVERSION N/A 04/17/2019   Procedure: TRANSESOPHAGEAL ECHOCARDIOGRAM (TEE);  Surgeon: Sherren Mocha, MD;  Location: [place] [facility];  Service: Open Heart Surgery;  Laterality: N/A;   TOTAL HIP ARTHROPLASTY     right x 3   TRANSCATHETER AORTIC VALVE REPLACEMENT, TRANSFEMORAL N/A 04/17/2019   Procedure: TRANSCATHETER AORTIC VALVE REPLACEMENT, TRANSFEMORAL;  Surgeon: Sherren Mocha, MD;  Location: [place] [facility];  Service: Open Heart Surgery;  Laterality: N/A;   UMBILICAL HERNIA REPAIR      Family History  Problem Relation Age of Onset   Ovarian cancer Mother 73   Colon cancer Mother 76       mets from ovarian CA   Heart disease Father    CAD Other        FAM Hx OF CAD   Diabetes Other        FAM Hx of DM   Hypertension Other    Esophageal cancer Neg Hx    Rectal cancer Neg Hx    Stomach cancer Neg Hx    Inflammatory bowel disease Neg Hx    Liver disease Neg Hx    Pancreatic cancer Neg Hx     Social History:  reports that she has never smoked. She has never used smokeless tobacco. She reports that she does not drink alcohol and does not use drugs.  Allergies:  Allergies  Allergen Reactions   Amoxicillin Anaphylaxis and Swelling    Did it involve swelling of the face/tongue/throat, SOB, or low BP? Yes Did it  involve sudden or severe rash/hives, skin peeling, or any reaction on the inside of your mouth or nose? No Did you need to seek medical attention at a hospital or doctor's office? Yes When did it last happen?    yrs ago   If all above answers are "NO", may proceed with cephalosporin use.    Flagyl [Metronidazole] Anaphylaxis   Plaquenil [Hydroxychloroquine Sulfate] Other (See Comments)    Blurred vision, joint pain   Codeine Nausea Only   Doxycycline Nausea And Vomiting   Other Nausea Only    Tylenol with Codeine #3   Percocet [Oxycodone-Acetaminophen] Nausea Only   Sulfa Antibiotics Nausea Only  No current facility-administered medications on file prior to encounter.   Current Outpatient Medications on File Prior to Encounter  Medication Sig Dispense Refill   acetaminophen (TYLENOL) 325 MG tablet Take 650 mg by mouth every 6 (six) hours as needed for mild pain, moderate pain, fever or headache.     ALPRAZolam (XANAX) 0.5 MG tablet Take 0.5 mg by mouth 2 (two) times daily as needed for sleep.      Aspirin 81 MG CAPS Take 1 mg by mouth 1 day or 1 dose.     cetirizine (ZYRTEC) 10 MG tablet Take 10 mg by mouth as needed for allergies.     Cholecalciferol 50 MCG (2000 UT) TABS Take 2,000 Units by mouth daily.      docusate sodium (COLACE) 100 MG capsule Take 1 capsule (100 mg total) by mouth 2 (two) times daily. While taking narcotic pain medicine. 30 capsule 0   fluticasone (FLONASE) 50 MCG/ACT nasal spray Place 1 spray into both nostrils as needed for allergies or rhinitis.     gabapentin (NEURONTIN) 300 MG capsule Take 300 mg by mouth daily.     pantoprazole (PROTONIX) 40 MG tablet Take 40 mg by mouth daily.     Polyethyl Glycol-Propyl Glycol (SYSTANE OP) Place 1-2 drops into both eyes as needed (dry eyes).     traZODone (DESYREL) 150 MG tablet Take 150 mg by mouth at bedtime.     venlafaxine XR (EFFEXOR-XR) 75 MG 24 hr capsule Take 75 mg by mouth daily with breakfast.      cyclobenzaprine (FLEXERIL) 10 MG tablet Take 1 tablet (10 mg total) by mouth 2 (two) times daily as needed for muscle spasms. (Patient not taking: Reported on 03/04/2022) 20 tablet 0   lidocaine (LIDODERM) 5 % Place 1 patch onto the skin daily. Remove & Discard patch within 12 hours or as directed by MD (Patient not taking: Reported on 03/04/2022) 7 patch 0   Loteprednol Etabonate (EYSUVIS) 0.25 % SUSP Place 1 drop into both eyes 3 (three) times a week. (Patient not taking: Reported on 03/04/2022)     ondansetron (ZOFRAN) 4 MG tablet Take 1 tablet (4 mg total) by mouth every 8 (eight) hours as needed for nausea or vomiting. (Patient not taking: Reported on 03/04/2022) 20 tablet 0   Propylene Glycol (SYSTANE BALANCE) 0.6 % SOLN Place 1 drop into both eyes in the morning, at noon, in the evening, and at bedtime. (Patient not taking: Reported on 03/04/2022)     senna (SENOKOT) 8.6 MG TABS tablet Take 2 tablets (17.2 mg total) by mouth 2 (two) times daily. (Patient not taking: Reported on 03/04/2022) 30 tablet 0   vitamin B-12 (CYANOCOBALAMIN) 250 MCG tablet Take 250 mcg by mouth daily. (Patient not taking: Reported on 03/04/2022)      Review of Systems: A full ROS was attempted today and was able to be performed.  Systems assessed include - Constitutional, Eyes, HENT, Respiratory, Cardiovascular, Gastrointestinal, Genitourinary, Integument/breast, Hematologic/lymphatic, Musculoskeletal, Neurological, Behavioral/Psych, Endocrine, Allergic/Immunologic - with pertinent responses as per HPI.  Physical Examination: Temp:  [97.9 F (36.6 C)-98.6 F (37 C)] 97.9 F (36.6 C) (09/21 0831) Pulse Rate:  [70-87] 73 (09/21 0831) Resp:  [16-28] 17 (09/21 0831) BP: (122-174)/(71-159) 137/82 (09/21 0831) SpO2:  [97 %-100 %] 99 % (09/21 0831) Weight:  [117.9 kg-124.6 kg] 124.6 kg (09/20 2121)  General - morbid obesity, well developed, in no apparent distress.    Ophthalmologic - fundi not visualized due to  noncooperation.  Cardiovascular - regular rhythm and rate  Mental Status -  Level of arousal and orientation to time, place, and person were intact. Language including expression, naming, repetition, comprehension was assessed and found intact. Fund of Knowledge was assessed and was intact.  Cranial Nerves II - XII - II - Vision intact OU. III, IV, VI - Extraocular movements intact. V - Facial sensation intact bilaterally. VII - Facial movement intact bilaterally. VIII - Hearing & vestibular intact bilaterally. X - Palate elevates symmetrically. XI - Chin turning & shoulder shrug intact bilaterally. XII - Tongue protrusion intact.  Motor Strength - The patient's strength was normal in bilateral upper extremities and pronator drift was absent. RLE limited hip ROM, but proximal 3/5 and distal at least 4/5. LLE foot with dressing, toe's amputated and 2/5 flexion but 0/5 extension in bed, not against gravity.   Motor Tone & Bulk - Muscle tone was assessed at the neck and appendages and was normal.  Bulk was normal and fasciculations were absent.   Reflexes - The patient's reflexes were normal in all extremities and she had no pathological reflexes.  Sensory - Light touch, temperature/pinprick were assessed and were normal.    Coordination - The patient had normal movements in the hands with no ataxia or dysmetria.  Tremor was absent.  Gait and Station - deferred  Data Reviewed: ECHOCARDIOGRAM COMPLETE BUBBLE STUDY  Result Date: 03/04/2022    ECHOCARDIOGRAM REPORT   Patient Name:   [name] Date of Exam: 03/04/2022 Medical Rec #:  [id number]         Height:       68.0 in Accession #:    [id number]        Weight:       274.7 lb Date of Birth:  09/28/1955         BSA:          2.339 m Patient Age:    63 years          BP:           137/82 mmHg Patient Gender: F                 HR:           73 bpm. Exam Location:  Inpatient Procedure: 2D Echo, Cardiac Doppler, Color Doppler and  Saline Contrast Bubble            Study Indications:    Stroke  History:        Patient has prior history of Echocardiogram examinations, most                 recent 01/05/2022. CAD; Risk Factors:Dyslipidemia.                 Aortic Valve: 26 mm Sapien prosthetic, stented (TAVR) valve is                 present in the aortic position. Procedure Date: 04/17/2019.  Sonographer:    Jefferey Pica Referring Phys: [id number] CAROLE N HALL IMPRESSIONS  1. 26 mm S3. Vmax 2.6 m/s, MG 14.5 mmHG, EOA 1.59 cm2, DI 0.46. Mean gradients has increased from 9 mmHG to 14.5 mmHG. Would recommend TEE or gated cardiac CTA to exclude leaflet thrombosis. The aortic valve has been repaired/replaced. Aortic valve regurgitation is not visualized. There is a 26 mm Sapien prosthetic (TAVR) valve present in the aortic position. Procedure Date: 04/17/2019.  2. Left ventricular ejection fraction, by estimation, is 65  to 70%. The left ventricle has normal function. The left ventricle has no regional wall motion abnormalities. There is moderate concentric left ventricular hypertrophy. Left ventricular diastolic parameters are consistent with Grade I diastolic dysfunction (impaired relaxation).  3. Right ventricular systolic function is normal. The right ventricular size is normal. Tricuspid regurgitation signal is inadequate for assessing PA pressure.  4. The mitral valve is grossly normal. No evidence of mitral valve regurgitation. No evidence of mitral stenosis.  5. The inferior vena cava is dilated in size with >50% respiratory variability, suggesting right atrial pressure of 8 mmHg.  6. Agitated saline contrast bubble study was negative, with no evidence of any interatrial shunt. FINDINGS  Left Ventricle: Left ventricular ejection fraction, by estimation, is 65 to 70%. The left ventricle has normal function. The left ventricle has no regional wall motion abnormalities. The left ventricular internal cavity size was normal in size. There is   moderate concentric left ventricular hypertrophy. Left ventricular diastolic parameters are consistent with Grade I diastolic dysfunction (impaired relaxation). Right Ventricle: The right ventricular size is normal. No increase in right ventricular wall thickness. Right ventricular systolic function is normal. Tricuspid regurgitation signal is inadequate for assessing PA pressure. Left Atrium: Left atrial size was normal in size. Right Atrium: Right atrial size was normal in size. Pericardium: There is no evidence of pericardial effusion. Presence of epicardial fat layer. Mitral Valve: The mitral valve is grossly normal. No evidence of mitral valve regurgitation. No evidence of mitral valve stenosis. Tricuspid Valve: The tricuspid valve is grossly normal. Tricuspid valve regurgitation is trivial. No evidence of tricuspid stenosis. Aortic Valve: 26 mm S3. Vmax 2.6 m/s, MG 14.5 mmHG, EOA 1.59 cm2, DI 0.46. Mean gradients has increased from 9 mmHG to 14.5 mmHG. Would recommend TEE or gated cardiac CTA to exclude leaflet thrombosis. The aortic valve has been repaired/replaced. Aortic valve regurgitation is not visualized. Aortic valve mean gradient measures 14.5 mmHg. Aortic valve peak gradient measures 27.1 mmHg. Aortic valve area, by VTI measures 1.59 cm. There is a 26 mm Sapien prosthetic, stented (TAVR) valve present in the aortic position. Procedure Date: 04/17/2019. Pulmonic Valve: The pulmonic valve was grossly normal. Pulmonic valve regurgitation is not visualized. No evidence of pulmonic stenosis. Aorta: The aortic root and ascending aorta are structurally normal, with no evidence of dilitation. Venous: The inferior vena cava is dilated in size with greater than 50% respiratory variability, suggesting right atrial pressure of 8 mmHg. IAS/Shunts: The atrial septum is grossly normal. Agitated saline contrast was given intravenously to evaluate for intracardiac shunting. Agitated saline contrast bubble study was  negative, with no evidence of any interatrial shunt.  LEFT VENTRICLE PLAX 2D LVIDd:         4.80 cm   Diastology LVIDs:         2.60 cm   LV e' medial:    5.68 cm/s LV PW:         1.40 cm   LV E/e' medial:  8.6 LV IVS:        1.30 cm   LV e' lateral:   5.35 cm/s LVOT diam:     2.10 cm   LV E/e' lateral: 9.2 LV SV:         92 LV SV Index:   39 LVOT Area:     3.46 cm  RIGHT VENTRICLE             IVC RV Basal diam:  2.80 cm     IVC diam:  2.20 cm RV S prime:     12.80 cm/s TAPSE (M-mode): 2.4 cm LEFT ATRIUM             Index        RIGHT ATRIUM           Index LA diam:        4.40 cm 1.88 cm/m   RA Area:     13.75 cm LA Vol (A2C):   36.4 ml 15.56 ml/m  RA Volume:   32.00 ml  13.68 ml/m LA Vol (A4C):   50.8 ml 21.72 ml/m LA Biplane Vol: 46.5 ml 19.88 ml/m  AORTIC VALVE                     PULMONIC VALVE AV Area (Vmax):    1.62 cm      PV Vmax:       0.91 m/s AV Area (Vmean):   1.39 cm      PV Peak grad:  3.3 mmHg AV Area (VTI):     1.59 cm AV Vmax:           260.50 cm/s AV Vmean:          177.500 cm/s AV VTI:            0.576 m AV Peak Grad:      27.1 mmHg AV Mean Grad:      14.5 mmHg LVOT Vmax:         122.00 cm/s LVOT Vmean:        71.400 cm/s LVOT VTI:          0.265 m LVOT/AV VTI ratio: 0.46  AORTA Ao Asc diam: 3.10 cm MITRAL VALVE MV Area (PHT): 4.19 cm    SHUNTS MV Decel Time: 181 msec    Systemic VTI:  0.26 m MV E velocity: 49.10 cm/s  Systemic Diam: 2.10 cm MV A velocity: 80.00 cm/s MV E/A ratio:  0.61 Eleonore Chiquito MD Electronically signed by Eleonore Chiquito MD Signature Date/Time: 03/04/2022/12:56:59 PM    Final    MR BRAIN WO CONTRAST  Result Date: 03/04/2022 CLINICAL DATA:  Neuro deficit with acute stroke suspected. EXAM: MRI HEAD WITHOUT CONTRAST TECHNIQUE: Multiplanar, multiecho pulse sequences of the brain and surrounding structures were obtained without intravenous contrast. COMPARISON:  CT and CTA from yesterday FINDINGS: Brain: No acute infarction, hemorrhage, hydrocephalus, extra-axial  collection or mass lesion. Vascular: Preserved flow voids. Bulbous appearance at the right MCA bifurcation, known aneurysm from prior CTA. Skull and upper cervical spine: Cervical facet spurring with C3-4 anterolisthesis. No focal marrow lesion. Sinuses/Orbits: Bilateral cataract resection. Generalized mild mucosal thickening in the paranasal sinuses IMPRESSION: 1. No acute finding.  Negative for infarct. 2. Right MCA aneurysm, see prior CTA. Electronically Signed   By: Jorje Guild M.D.   On: 03/04/2022 05:11   CT ANGIO HEAD NECK W WO CM  Result Date: 03/03/2022 CLINICAL DATA:  Initial evaluation for headache, brain fog. Facial numbness. EXAM: CT ANGIOGRAPHY HEAD AND NECK TECHNIQUE: Multidetector CT imaging of the head and neck was performed using the standard protocol during bolus administration of intravenous contrast. Multiplanar CT image reconstructions and MIPs were obtained to evaluate the vascular anatomy. Carotid stenosis measurements (when applicable) are obtained utilizing NASCET criteria, using the distal internal carotid diameter as the denominator. RADIATION DOSE REDUCTION: This exam was performed according to the departmental dose-optimization program which includes automated exposure control, adjustment of the mA and/or kV according to patient size and/or use  of iterative reconstruction technique. CONTRAST:  41m OMNIPAQUE IOHEXOL 350 MG/ML SOLN COMPARISON:  CT from earlier the same day. FINDINGS: CTA NECK FINDINGS Aortic arch: Visualized aortic arch normal in caliber. Bovine branching pattern noted. No stenosis about the origin of the great vessels. Right carotid system: Right common and internal carotid arteries are patent without stenosis, dissection or occlusion. Left carotid system: Left common and internal carotid arteries are patent without stenosis, dissection or occlusion. Vertebral arteries: Both vertebral arteries arise from subclavian arteries. No proximal subclavian artery  stenosis. Vertebral arteries patent without stenosis, dissection or occlusion. Skeleton: No discrete or worrisome osseous lesions. Moderate spondylosis present at C4-5 through C6-7. Degenerative changes noted about the right TMJ. Other neck: No other acute soft tissue abnormality within the neck. Upper chest: Visualized upper chest demonstrates no acute finding. Review of the MIP images confirms the above findings CTA HEAD FINDINGS Anterior circulation: Both internal carotid arteries patent to the termini without stenosis. A1 segments patent bilaterally. Negative anterior communicating artery complex. Both ACAs patent to their distal aspects without stenosis. No M1 stenosis or occlusion. 5-6 mm saccular aneurysm seen arising from the right MCA bifurcation (series 8, image 103). No proximal MCA branch occlusion. Distal MCA branches perfused and symmetric. Posterior circulation: Both V4 segments patent without stenosis. Both PICA patent. Basilar patent to its distal aspect without stenosis. Superior cerebral arteries patent bilaterally. Both PCAs primarily supplied via the basilar well perfused or distal aspects. Venous sinuses: Patent allowing for timing the contrast bolus. DVA noted at the right cerebellum. Anatomic variants: None significant. Review of the MIP images confirms the above findings IMPRESSION: 1. Negative CTA of the head and neck for large vessel occlusion or other emergent finding. No significant atheromatous disease for age. No hemodynamically significant or correctable stenosis. 2. 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 Electronically Signed   By: BJeannine BogaM.D.   On: 03/03/2022 22:00   CT HEAD CODE STROKE WO CONTRAST  Result Date: 03/03/2022 CLINICAL DATA:  Code stroke. Initial evaluation for neuro deficit, stroke suspected. EXAM: CT HEAD WITHOUT CONTRAST TECHNIQUE: Contiguous axial images were obtained from the base of the skull through the vertex without  intravenous contrast. RADIATION DOSE REDUCTION: This exam was performed according to the departmental dose-optimization program which includes automated exposure control, adjustment of the mA and/or kV according to patient size and/or use of iterative reconstruction technique. COMPARISON:  None Available. FINDINGS: Brain: Cerebral volume within normal limits. No acute intracranial hemorrhage. No acute large vessel territory infarct. No mass lesion, midline shift or mass effect. No hydrocephalus or extra-axial fluid collection. Vascular: No abnormal hyperdense vessel. Skull: Scalp soft tissues and calvarium within normal limits. Sinuses/Orbits: Globes orbital soft tissues within normal limits. Paranasal sinuses are largely clear. No mastoid effusion. Other: None. ASPECTS ([facility] Institute[facility] Program Early CT Score) - Ganglionic level infarction (caudate, lentiform nuclei, internal capsule, insula, M1-M3 cortex): 7 - Supraganglionic infarction (M4-M6 cortex): 3 Total score (0-10 with 10 being normal): 10 IMPRESSION: 1. No acute intracranial abnormality. 2. ASPECTS is 10. Critical Value/emergent results were called by telephone at the time of interpretation on 03/03/2022 at 9:01 pm [facility] verbally acknowledged these results. Electronically Signed   By: BJeannine BogaM.D.   On: 03/03/2022 21:02    Assessment: 66y.o. 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  MRI showed no acute infarct.  LDL 44, A1c 6.2, UDS negative.  ESR 30, CRP 1.4.  However, 2D echo showed EF 65 to 70%, AV mean gradient increased from 9-14.5, recommend TEE or gated cardiac CTA to exclude leftlet thrombosis.  Etiology for patient  headache and numbness likely complicated migraine.  Less likely for TIA although patient does have several stroke risk factors.  2D echo recommend TEE or gated cardiac CTA to rule out AV thrombosis given history of TAVR and increased pressure gradient.  Continue home aspirin and Crestor for at this time.  Discussed with Dr. Norma Fredrickson for right MCA aneurysm, agree with outpatient follow-up.  We will follow  Stroke Risk Factors - hyperlipidemia, hypertension, and OSA and CAD  Plan: - Continue home aspirin 81 and Crestor 20 - cardiology follow-up of elevated AV gradient status post TAVR.  Recommend TEE or gated cardiac CTA to rule out AV thrombosis - Outpatient follow-up with Dr. Norma Fredrickson IR for right MCA aneurysm management - outpt follow-up with [facility] in 4 weeks - will follow  Thank you for this consultation and allowing [place] to participate in the care of this patient.  Rosalin Hawking, MD PhD Stroke Neurology 03/04/2022 4:36 PM

## 2022-03-04 NOTE — Consult Note (Addendum)
Cardiology Consultation   Patient ID: [name] MRN: [id number]; DOB: Oct 27, 1955  Admit date: 03/03/2022 Date of Consult: 03/04/2022  PCP:  [name], [facility] Cardiologist:  Minus Breeding, MD  Cardiology APP:  Ledora Bottcher, PA  {  Patient Profile:   [name] is a 66 y.o. 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 Waldron Labs.  History of Present Illness:   [name] is a 66 year old female with the above history.  She underwent TAVR in 04/2019 for severe aortic stenosis. Cardiac catheterization prior to that showed normal coronaries.  Patient was last seen by Doreene Adas, PA-C, in 12/2021 for preop evaluation for upcoming amputation of left third and fourth toes.  Repeat Echo was ordered to reassess aortic valve and showed LVEF of 60-65% with normal structure and function of the aortic valve prosthesis.  Patient recently underwent additional amputations of the left second, third, and fourth toes through the IP joints on 02/18/2022 due to hammertoes with tight flexor tendons.  She tolerated the procedure well without any immediate complications.  Patient presented to the ED on 03/03/2022 for further evaluation of severe headache and facial numbness.  Upon arrival to the ED, patient was hypertensive with BP as high as 171/98 but otherwise vital stable.  Code stroke was activated on arrival.  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 EKG showed normal  sinus rhythm, rate 84 bpm, with no acute ST/T changes. WBC 9.3, Hgb 13.5, Plts 265. Na 137, K 4.3, Glucose 122, BUN 10, Cr 0.74. LFTs normal.  Sed rate and CRP elevated at 30 and 1.4, respectively.  She was admitted for further TIA/stroke work-up.  Brain MRI again showed right MCA aneurysm but no evidence of infarct.  Echo with bubble study showed LVEF of 65-7% with no evidence of any intra-atrial shunt.  However, it did show an increase in the mean gradient across the aortic valve from 9 mmHg to 14.5 mmHg and there was concern for possible leaflet thrombosis.  Cardiology was Consulted for further evaluation.  Patient states she has been in her usual state of health until 2 days ago when she developed a severe headache in the middle of her eyes. She took about a 1 hour nap and then felt like she was just in a brain fog for a while but headache was better. She was then driving in the car yesterday and started having some right sided facial numbness which is why she came to the ED. She has been doing well from a cardiac standpoint. She has intermittent atypical chest pain that she states is due to her costochondritis that occurs occasionally but she was unable to tell me how often. She often has back pain with this. She has been having this since before her TAVR and sounds overall stable. Prior CTA during one of these episode has been negative for acute intrathoracic pathology.  When she has these episodes, she has chest wall tenderness to palpation and pain improves with massaging her chest. She denies any  significant shortness of breath, orthopnea, PND, lower extremity edema. She notes occasional lightheadedness/dizziness when standing but no palpitations or syncope. She has no some occasional nasal congestion and cough from allergies. No other fever or illnesses. No abnormal bleeding in urine or stools.  At the time of this evaluation, she reports improvement in her headache and facial numbness.   Past  Medical History:  Diagnosis Date   Allergy    seasonal allergies   Anxiety    on meds   Arthritis    low back issues   Carpal tunnel syndrome    LEFT   Cataract    bilateral -sx   Cervical pain (neck)    Complication of anesthesia    DIFFICULTY WAKING UP   Coronary artery disease    Depression    on meds   GERD (gastroesophageal reflux disease)    on meds   Hyperlipidemia    diet controlled- not on meds at this time (08/05/2020)   Insomnia    Mass of right lower leg    Morbid obesity (HCC)    OSA on CPAP    uses CPAP nightly   Osteopenia    neck   PONV (postoperative nausea and vomiting)    Post-polio syndrome    With left leg paralysis   S/P TAVR (transcatheter aortic valve replacement)    Sigmoid diverticulosis    Ulnar nerve abnormality     Past Surgical History:  Procedure Laterality Date   AMPUTATION TOE Left 02/18/2022   Procedure: Second and Third distal interphalangeal and Fourth proximal interphalangeal  toe amputations;  Surgeon: Wylene Simmer, MD;  Location: [facility];  Service: Orthopedics;  Laterality: Left;   ANKLE FUSION     left   APPENDECTOMY     BIOPSY  05/04/2021   Procedure: BIOPSY;  Surgeon: Rush Landmark Telford Nab., MD;  Location: [facility];  Service: Gastroenterology;;   CHOLECYSTECTOMY     COLONOSCOPY     COLONOSCOPY N/A 05/04/2021   Procedure: COLONOSCOPY;  Surgeon: Irving Copas., MD;  Location: [facility];  Service: Gastroenterology;  Laterality: N/A;   ESOPHAGOGASTRODUODENOSCOPY (EGD) WITH PROPOFOL N/A 05/04/2021   Procedure: ESOPHAGOGASTRODUODENOSCOPY (EGD) WITH PROPOFOL;  Surgeon: Rush Landmark Telford Nab., MD;  Location: [facility];  Service: Gastroenterology;  Laterality: N/A;   EYE SURGERY Bilateral 2020   cataracts removed August and September 2020   JOINT REPLACEMENT Right 2003   three replacements; 2003 is last one   KNEE ARTHROPLASTY     LEG SURGERY     muscle surgery related to polio   MASS  EXCISION Right 01/08/2020   Procedure: EXCISION RIGHT LEG MASS;  Surgeon: Erroll Luna, MD;  Location: [facility];  Service: General;  Laterality: Right;   POLYPECTOMY  05/04/2021   Procedure: POLYPECTOMY;  Surgeon: Irving Copas., MD;  Location: [facility];  Service: Gastroenterology;;   RECTAL PROLAPSE REPAIR  2020   pessary placed   RIGHT/LEFT HEART CATH AND CORONARY ANGIOGRAPHY N/A 04/02/2019   Procedure: RIGHT/LEFT HEART CATH AND CORONARY ANGIOGRAPHY;  Surgeon: Nelva Bush, MD;  Location: [facility];  Service: Cardiovascular;  Laterality: N/A;   SAVORY DILATION N/A 05/04/2021   Procedure: SAVORY DILATION;  Surgeon: Rush Landmark Telford Nab., MD;  Location: [facility];  Service: Gastroenterology;  Laterality: N/A;   TEE WITHOUT CARDIOVERSION N/A 04/17/2019   Procedure: TRANSESOPHAGEAL ECHOCARDIOGRAM (TEE);  Surgeon: Sherren Mocha, MD;  Location: [facility];  Service: Open Heart Surgery;  Laterality: N/A;  TOTAL HIP ARTHROPLASTY     right x 3   TRANSCATHETER AORTIC VALVE REPLACEMENT, TRANSFEMORAL N/A 04/17/2019   Procedure: TRANSCATHETER AORTIC VALVE REPLACEMENT, TRANSFEMORAL;  Surgeon: Sherren Mocha, MD;  Location: [facility];  Service: Open Heart Surgery;  Laterality: N/A;   UMBILICAL HERNIA REPAIR       Home Medications:  Prior to Admission medications   Medication Sig Start Date End Date Taking? Authorizing Provider  acetaminophen (TYLENOL) 325 MG tablet Take 650 mg by mouth every 6 (six) hours as needed for mild pain, moderate pain, fever or headache.   Yes [provider]  ALPRAZolam Duanne Moron) 0.5 MG tablet Take 0.5 mg by mouth 2 (two) times daily as needed for sleep.    Yes [provider]  Aspirin 81 MG CAPS Take 1 mg by mouth 1 day or 1 dose.   Yes [provider]  cetirizine (ZYRTEC) 10 MG tablet Take 10 mg by mouth as needed for allergies.   Yes [provider]  Cholecalciferol 50  MCG (2000 UT) TABS Take 2,000 Units by mouth daily.    Yes [provider]  docusate sodium (COLACE) 100 MG capsule Take 1 capsule (100 mg total) by mouth 2 (two) times daily. While taking narcotic pain medicine. 02/18/22  Yes Corky Sing, PA-C  fluticasone [facility]) 50 MCG/ACT nasal spray Place 1 spray into both nostrils as needed for allergies or rhinitis.   Yes [provider]  gabapentin (NEURONTIN) 300 MG capsule Take 300 mg by mouth daily.   Yes [provider]  pantoprazole (PROTONIX) 40 MG tablet Take 40 mg by mouth daily.   Yes [provider]  Polyethyl Glycol-Propyl Glycol (SYSTANE OP) Place 1-2 drops into both eyes as needed (dry eyes).   Yes [provider]  traZODone (DESYREL) 150 MG tablet Take 150 mg by mouth at bedtime.   Yes [provider]  venlafaxine XR (EFFEXOR-XR) 75 MG 24 hr capsule Take 75 mg by mouth daily with breakfast.   Yes [provider]  cyclobenzaprine (FLEXERIL) 10 MG tablet Take 1 tablet (10 mg total) by mouth 2 (two) times daily as needed for muscle spasms. Patient not taking: Reported on 03/04/2022 01/10/22   Dorie Rank, MD  lidocaine (LIDODERM) 5 % Place 1 patch onto the skin daily. Remove & Discard patch within 12 hours or as directed by MD Patient not taking: Reported on 03/04/2022 01/10/22   Dorie Rank, MD  Loteprednol Etabonate (EYSUVIS) 0.25 % SUSP Place 1 drop into both eyes 3 (three) times a week. Patient not taking: Reported on 03/04/2022    [provider]  ondansetron (ZOFRAN) 4 MG tablet Take 1 tablet (4 mg total) by mouth every 8 (eight) hours as needed for nausea or vomiting. Patient not taking: Reported on 03/04/2022 02/18/22   Corky Sing, PA-C  Propylene Glycol (SYSTANE BALANCE) 0.6 % SOLN Place 1 drop into both eyes in the morning, at noon, in the evening, and at bedtime. Patient not taking: Reported on 03/04/2022    [provider]  rosuvastatin (CRESTOR) 20  MG tablet Take 1 tablet (20 mg total) by mouth daily. 03/04/22   Elgergawy, Silver Huguenin, MD  senna (SENOKOT) 8.6 MG TABS tablet Take 2 tablets (17.2 mg total) by mouth 2 (two) times daily. Patient not taking: Reported on 03/04/2022 02/18/22   Corky Sing, PA-C  vitamin B-12 (CYANOCOBALAMIN) 250 MCG tablet Take 250 mcg by mouth daily. Patient not taking: Reported on 03/04/2022  [provider]    Inpatient Medications: Scheduled Meds:  aspirin EC  81 mg Oral Daily   [START ON 03/05/2022] cholecalciferol  2,000 Units Oral Daily   cyanocobalamin  250 mcg Oral Daily   gabapentin  300 mg Oral BID   pantoprazole  40 mg Oral Daily   rosuvastatin  20 mg Oral Daily   traZODone  150 mg Oral QHS   venlafaxine XR  75 mg Oral Q breakfast   Continuous Infusions:  sodium chloride 50 mL/hr at 03/04/22 1626   PRN Meds: acetaminophen, ALPRAZolam, fluticasone, polyethylene glycol, prochlorperazine  Allergies:    Allergies  Allergen Reactions   Amoxicillin Anaphylaxis and Swelling    Did it involve swelling of the face/tongue/throat, SOB, or low BP? Yes Did it involve sudden or severe rash/hives, skin peeling, or any reaction on the inside of your mouth or nose? No Did you need to seek medical attention at a hospital or doctor's office? Yes When did it last happen?    yrs ago   If all above answers are "NO", may proceed with cephalosporin use.    Flagyl [Metronidazole] Anaphylaxis   Plaquenil [Hydroxychloroquine Sulfate] Other (See Comments)    Blurred vision, joint pain   Codeine Nausea Only   Doxycycline Nausea And Vomiting   Other Nausea Only    Tylenol with Codeine #3   Percocet [Oxycodone-Acetaminophen] Nausea Only   Sulfa Antibiotics Nausea Only    Social History:   Social History   Socioeconomic History   Marital status: Single    Spouse name: Not on file   Number of children: Not on file   Years of education: Not on file   Highest education level: Not on file   Occupational History   Not on file  Tobacco Use   Smoking status: Never   Smokeless tobacco: Never  Vaping Use   Vaping Use: Never used  Substance and Sexual Activity   Alcohol use: No   Drug use: Never   Sexual activity: Not on file  Other Topics Concern   Not on file  Social History Narrative   Lives alone.   Social Determinants of Health   Financial Resource Strain: Not on file  Food Insecurity: Not on file  Transportation Needs: Not on file  Physical Activity: Not on file  Stress: Not on file  Social Connections: Not on file  Intimate Partner Violence: Not on file    Family History:   Family History  Problem Relation Age of Onset   Ovarian cancer Mother 56   Colon cancer Mother 105       mets from ovarian CA   Heart disease Father    CAD Other        FAM Hx OF CAD   Diabetes Other        FAM Hx of DM   Hypertension Other    Esophageal cancer Neg Hx    Rectal cancer Neg Hx    Stomach cancer Neg Hx    Inflammatory bowel disease Neg Hx    Liver disease Neg Hx    Pancreatic cancer Neg Hx      ROS:  Please see the history of present illness.  Review of Systems  Constitutional:  Negative for fever.  HENT:  Positive for nosebleeds.   Respiratory:  Positive for cough. Negative for shortness of breath.   Cardiovascular:  Positive for chest pain (intermittent chest wall pain). Negative for palpitations, orthopnea, leg swelling and PND.  Gastrointestinal:  Negative for blood in stool, diarrhea, melena, nausea and vomiting.  Genitourinary:  Negative for hematuria.  Musculoskeletal:  Positive for back pain.  Neurological:  Positive for headaches. Negative for loss of consciousness.  Endo/Heme/Allergies:  Does not bruise/bleed easily.  Psychiatric/Behavioral:  Negative for substance abuse.       Physical Exam/Data:   Vitals:   03/04/22 0030 03/04/22 0400 03/04/22 0831 03/04/22 1620  BP: 137/80 (!) 122/94 137/82 (!) 109/55  Pulse: 84 70 73 75  Resp: '20 16 17  14  '$ Temp:  98.3 F (36.8 C) 97.9 F (36.6 C) 97.7 F (36.5 C)  TempSrc:  Oral Oral Oral  SpO2: 99% 99% 99% 95%  Weight:      Height:        Intake/Output Summary (Last 24 hours) at 03/04/2022 1737 Last data filed at 03/04/2022 1513 Gross per 24 hour  Intake 325.25 ml  Output --  Net 325.25 ml      03/03/2022    9:21 PM 03/03/2022    8:34 PM 02/18/2022    7:50 AM  Last 3 Weights  Weight (lbs) 274 lb 11.1 oz 260 lb 260 lb  Weight (kg) 124.6 kg 117.935 kg 117.935 kg     Body mass index is 41.77 kg/m.  General: 66 y.o. obese Caucasian female resting comfortably in no acute distress.  HEENT: Normocephalic and atraumatic. Sclera clear. Neck: Supple. No JVD. Heart: RRR. Distinct S1 and S2. No murmurs, gallops, or rubs. Radial pulses 2+ and equal bilaterally. Lungs: No increased work of breathing. Clear to ausculation bilaterally. No wheezes, rhonchi, or rales.  Abdomen: Soft, non-distended, and non-tender to palpation. Bowel sounds present. MSK: Normal strength and tone for age. Extremities: No lower extremity edema.   Skin: Warm and dry. Neuro: Alert and oriented x3. No focal deficits. Psych: Normal affect. Responds appropriately.   EKG:  The EKG was personally reviewed and demonstrates:  Normal sinus rhythm, rate 84 beats minute, with no acute ST/T changes Telemetry:  Telemetry was personally reviewed and demonstrates:  Normal sinus rhythm with rates in the 70s.  Relevant CV Studies:  Right/Left Cardiac Catheterization 04/02/2019: Conclusions: No angiographically significant CAD. Severe aortic stenosis (mean gradient 65 mmHg, calculated valve area 0.5 cm^2). Moderately to severely elevated left ventricular filling pressure. Mildly elevated right and pulmonary artery pressures. Moderately reduced cardiac output.   Recommendations: Medical therapy, including careful blood pressure control and diuresis. Valve team consultation for further workup/management of severe aortic  stenosis.  Diagnostic Dominance: Right  _______________   Echocardiogram 01/05/2022: Impressions: 1. The aortic valve was not well visualized. 26 mm Sapien Valve, mean  gradinet 9 mm Hg, no PVL, DVI 0.40. Aortic valve regurgitation is not  visualized. No aortic stenosis is present. Echo findings are consistent  with normal structure and function of the   aortic valve prosthesis.   2. Left ventricular ejection fraction, by estimation, is 60 to 65%. The  left ventricle has normal function. The left ventricle has no regional  wall motion abnormalities. Left ventricular diastolic parameters are  consistent with Grade I diastolic  dysfunction (impaired relaxation).   3. Right ventricular systolic function is normal. The right ventricular  size is normal. Tricuspid regurgitation signal is inadequate for assessing  PA pressure.   4. The mitral valve is normal in structure. No evidence of mitral valve  regurgitation. No evidence of mitral stenosis.  _______________  Echocardiogram with Bubble Study 03/04/2022: Impressions:  1. 26 mm S3. Vmax 2.6 m/s, MG 14.5 mmHG,  EOA 1.59 cm2, DI 0.46. Mean  gradients has increased from 9 mmHG to 14.5 mmHG. Would recommend TEE or  gated cardiac CTA to exclude leaflet thrombosis. The aortic valve has been  repaired/replaced. Aortic valve  regurgitation is not visualized. There is a 26 mm Sapien prosthetic (TAVR)  valve present in the aortic position. Procedure Date: 04/17/2019.   2. Left ventricular ejection fraction, by estimation, is 65 to 70%. The  left ventricle has normal function. The left ventricle has no regional  wall motion abnormalities. There is moderate concentric left ventricular  hypertrophy. Left ventricular  diastolic parameters are consistent with Grade I diastolic dysfunction  (impaired relaxation).   3. Right ventricular systolic function is normal. The right ventricular  size is normal. Tricuspid regurgitation signal is inadequate  for assessing  PA pressure.   4. The mitral valve is grossly normal. No evidence of mitral valve  regurgitation. No evidence of mitral stenosis.   5. The inferior vena cava is dilated in size with >50% respiratory  variability, suggesting right atrial pressure of 8 mmHg.   6. Agitated saline contrast bubble study was negative, with no evidence  of any interatrial shunt.    Laboratory Data:  High Sensitivity Troponin:  No results for input(s): "TROPONINIHS" in the last 720 hours.   Chemistry Recent Labs  Lab 03/03/22 2115  NA 137  K 4.3  CL 103  CO2 25  GLUCOSE 122*  BUN 10  CREATININE 0.74  CALCIUM 8.8*  GFRNONAA >60  ANIONGAP 9    Recent Labs  Lab 03/03/22 2115  PROT 6.7  ALBUMIN 3.7  AST 20  ALT 12  ALKPHOS 88  BILITOT 0.3   Lipids  Recent Labs  Lab 03/04/22 0326  CHOL 127  TRIG 222*  HDL 39*  LDLCALC 44  CHOLHDL 3.3    Hematology Recent Labs  Lab 03/03/22 2115  WBC 9.3  RBC 4.27  HGB 13.5  HCT 39.4  MCV 92.3  MCH 31.6  MCHC 34.3  RDW 12.9  PLT 265   Thyroid No results for input(s): "TSH", "FREET4" in the last 168 hours.  BNPNo results for input(s): "BNP", "PROBNP" in the last 168 hours.  DDimer No results for input(s): "DDIMER" in the last 168 hours.   Radiology/Studies:  ECHOCARDIOGRAM COMPLETE BUBBLE STUDY  Result Date: 03/04/2022    ECHOCARDIOGRAM REPORT   Patient Name:   [name] Date of Exam: 03/04/2022 Medical Rec #:  [id number]         Height:       68.0 in Accession #:    [id number]        Weight:       274.7 lb Date of Birth:  [birth year]         BSA:          2.339 m Patient Age:    65 years          BP:           137/82 mmHg Patient Gender: F                 HR:           73 bpm. Exam Location:  Inpatient Procedure: 2D Echo, Cardiac Doppler, Color Doppler and Saline Contrast Bubble            Study Indications:    Stroke  History:        Patient has prior history of Echocardiogram examinations, most  recent  01/05/2022. CAD; Risk Factors:Dyslipidemia.                 Aortic Valve: 26 mm Sapien prosthetic, stented (TAVR) valve is                 present in the aortic position. Procedure Date: 04/17/2019.  Sonographer:    Jefferey Pica Referring Phys: [id number] CAROLE N HALL IMPRESSIONS  1. 26 mm S3. Vmax 2.6 m/s, MG 14.5 mmHG, EOA 1.59 cm2, DI 0.46. Mean gradients has increased from 9 mmHG to 14.5 mmHG. Would recommend TEE or gated cardiac CTA to exclude leaflet thrombosis. The aortic valve has been repaired/replaced. Aortic valve regurgitation is not visualized. There is a 26 mm Sapien prosthetic (TAVR) valve present in the aortic position. Procedure Date: 04/17/2019.  2. Left ventricular ejection fraction, by estimation, is 65 to 70%. The left ventricle has normal function. The left ventricle has no regional wall motion abnormalities. There is moderate concentric left ventricular hypertrophy. Left ventricular diastolic parameters are consistent with Grade I diastolic dysfunction (impaired relaxation).  3. Right ventricular systolic function is normal. The right ventricular size is normal. Tricuspid regurgitation signal is inadequate for assessing PA pressure.  4. The mitral valve is grossly normal. No evidence of mitral valve regurgitation. No evidence of mitral stenosis.  5. The inferior vena cava is dilated in size with >50% respiratory variability, suggesting right atrial pressure of 8 mmHg.  6. Agitated saline contrast bubble study was negative, with no evidence of any interatrial shunt. FINDINGS  Left Ventricle: Left ventricular ejection fraction, by estimation, is 65 to 70%. The left ventricle has normal function. The left ventricle has no regional wall motion abnormalities. The left ventricular internal cavity size was normal in size. There is  moderate concentric left ventricular hypertrophy. Left ventricular diastolic parameters are consistent with Grade I diastolic dysfunction (impaired relaxation). Right  Ventricle: The right ventricular size is normal. No increase in right ventricular wall thickness. Right ventricular systolic function is normal. Tricuspid regurgitation signal is inadequate for assessing PA pressure. Left Atrium: Left atrial size was normal in size. Right Atrium: Right atrial size was normal in size. Pericardium: There is no evidence of pericardial effusion. Presence of epicardial fat layer. Mitral Valve: The mitral valve is grossly normal. No evidence of mitral valve regurgitation. No evidence of mitral valve stenosis. Tricuspid Valve: The tricuspid valve is grossly normal. Tricuspid valve regurgitation is trivial. No evidence of tricuspid stenosis. Aortic Valve: 26 mm S3. Vmax 2.6 m/s, MG 14.5 mmHG, EOA 1.59 cm2, DI 0.46. Mean gradients has increased from 9 mmHG to 14.5 mmHG. Would recommend TEE or gated cardiac CTA to exclude leaflet thrombosis. The aortic valve has been repaired/replaced. Aortic valve regurgitation is not visualized. Aortic valve mean gradient measures 14.5 mmHg. Aortic valve peak gradient measures 27.1 mmHg. Aortic valve area, by VTI measures 1.59 cm. There is a 26 mm Sapien prosthetic, stented (TAVR) valve present in the aortic position. Procedure Date: 04/17/2019. Pulmonic Valve: The pulmonic valve was grossly normal. Pulmonic valve regurgitation is not visualized. No evidence of pulmonic stenosis. Aorta: The aortic root and ascending aorta are structurally normal, with no evidence of dilitation. Venous: The inferior vena cava is dilated in size with greater than 50% respiratory variability, suggesting right atrial pressure of 8 mmHg. IAS/Shunts: The atrial septum is grossly normal. Agitated saline contrast was given intravenously to evaluate for intracardiac shunting. Agitated saline contrast bubble study was negative, with no evidence of any interatrial shunt.  LEFT VENTRICLE PLAX 2D LVIDd:         4.80 cm   Diastology LVIDs:         2.60 cm   LV e' medial:    5.68 cm/s LV  PW:         1.40 cm   LV E/e' medial:  8.6 LV IVS:        1.30 cm   LV e' lateral:   5.35 cm/s LVOT diam:     2.10 cm   LV E/e' lateral: 9.2 LV SV:         92 LV SV Index:   39 LVOT Area:     3.46 cm  RIGHT VENTRICLE             IVC RV Basal diam:  2.80 cm     IVC diam: 2.20 cm RV S prime:     12.80 cm/s TAPSE (M-mode): 2.4 cm LEFT ATRIUM             Index        RIGHT ATRIUM           Index LA diam:        4.40 cm 1.88 cm/m   RA Area:     13.75 cm LA Vol (A2C):   36.4 ml 15.56 ml/m  RA Volume:   32.00 ml  13.68 ml/m LA Vol (A4C):   50.8 ml 21.72 ml/m LA Biplane Vol: 46.5 ml 19.88 ml/m  AORTIC VALVE                     PULMONIC VALVE AV Area (Vmax):    1.62 cm      PV Vmax:       0.91 m/s AV Area (Vmean):   1.39 cm      PV Peak grad:  3.3 mmHg AV Area (VTI):     1.59 cm AV Vmax:           260.50 cm/s AV Vmean:          177.500 cm/s AV VTI:            0.576 m AV Peak Grad:      27.1 mmHg AV Mean Grad:      14.5 mmHg LVOT Vmax:         122.00 cm/s LVOT Vmean:        71.400 cm/s LVOT VTI:          0.265 m LVOT/AV VTI ratio: 0.46  AORTA Ao Asc diam: 3.10 cm MITRAL VALVE MV Area (PHT): 4.19 cm    SHUNTS MV Decel Time: 181 msec    Systemic VTI:  0.26 m MV E velocity: 49.10 cm/s  Systemic Diam: 2.10 cm MV A velocity: 80.00 cm/s MV E/A ratio:  0.61 Eleonore Chiquito MD Electronically signed by Eleonore Chiquito MD Signature Date/Time: 03/04/2022/12:56:59 PM    Final    MR BRAIN WO CONTRAST  Result Date: 03/04/2022 CLINICAL DATA:  Neuro deficit with acute stroke suspected. EXAM: MRI HEAD WITHOUT CONTRAST TECHNIQUE: Multiplanar, multiecho pulse sequences of the brain and surrounding structures were obtained without intravenous contrast. COMPARISON:  CT and CTA from yesterday FINDINGS: Brain: No acute infarction, hemorrhage, hydrocephalus, extra-axial collection or mass lesion. Vascular: Preserved flow voids. Bulbous appearance at the right MCA bifurcation, known aneurysm from prior CTA. Skull and upper cervical spine:  Cervical facet spurring with C3-4 anterolisthesis. No focal marrow lesion. Sinuses/Orbits: Bilateral cataract resection. Generalized mild mucosal thickening in  the paranasal sinuses IMPRESSION: 1. No acute finding.  Negative for infarct. 2. Right MCA aneurysm, see prior CTA. Electronically Signed   By: Jorje Guild M.D.   On: 03/04/2022 05:11   CT ANGIO HEAD NECK W WO CM  Result Date: 03/03/2022 CLINICAL DATA:  Initial evaluation for headache, brain fog. Facial numbness. EXAM: CT ANGIOGRAPHY HEAD AND NECK TECHNIQUE: Multidetector CT imaging of the head and neck was performed using the standard protocol during bolus administration of intravenous contrast. Multiplanar CT image reconstructions and MIPs were obtained to evaluate the vascular anatomy. Carotid stenosis measurements (when applicable) are obtained utilizing NASCET criteria, using the distal internal carotid diameter as the denominator. RADIATION DOSE REDUCTION: This exam was performed according to the departmental dose-optimization program which includes automated exposure control, adjustment of the mA and/or kV according to patient size and/or use of iterative reconstruction technique. CONTRAST:  76m OMNIPAQUE IOHEXOL 350 MG/ML SOLN COMPARISON:  CT from earlier the same day. FINDINGS: CTA NECK FINDINGS Aortic arch: Visualized aortic arch normal in caliber. Bovine branching pattern noted. No stenosis about the origin of the great vessels. Right carotid system: Right common and internal carotid arteries are patent without stenosis, dissection or occlusion. Left carotid system: Left common and internal carotid arteries are patent without stenosis, dissection or occlusion. Vertebral arteries: Both vertebral arteries arise from subclavian arteries. No proximal subclavian artery stenosis. Vertebral arteries patent without stenosis, dissection or occlusion. Skeleton: No discrete or worrisome osseous lesions. Moderate spondylosis present at C4-5 through  C6-7. Degenerative changes noted about the right TMJ. Other neck: No other acute soft tissue abnormality within the neck. Upper chest: Visualized upper chest demonstrates no acute finding. Review of the MIP images confirms the above findings CTA HEAD FINDINGS Anterior circulation: Both internal carotid arteries patent to the termini without stenosis. A1 segments patent bilaterally. Negative anterior communicating artery complex. Both ACAs patent to their distal aspects without stenosis. No M1 stenosis or occlusion. 5-6 mm saccular aneurysm seen arising from the right MCA bifurcation (series 8, image 103). No proximal MCA branch occlusion. Distal MCA branches perfused and symmetric. Posterior circulation: Both V4 segments patent without stenosis. Both PICA patent. Basilar patent to its distal aspect without stenosis. Superior cerebral arteries patent bilaterally. Both PCAs primarily supplied via the basilar well perfused or distal aspects. Venous sinuses: Patent allowing for timing the contrast bolus. DVA noted at the right cerebellum. Anatomic variants: None significant. Review of the MIP images confirms the above findings IMPRESSION: 1. Negative CTA of the head and neck for large vessel occlusion or other emergent finding. No significant atheromatous disease for age. No hemodynamically significant or correctable stenosis. 2. 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 Electronically Signed   By: BJeannine BogaM.D.   On: 03/03/2022 22:00   CT HEAD CODE STROKE WO CONTRAST  Result Date: 03/03/2022 CLINICAL DATA:  Code stroke. Initial evaluation for neuro deficit, stroke suspected. EXAM: CT HEAD WITHOUT CONTRAST TECHNIQUE: Contiguous axial images were obtained from the base of the skull through the vertex without intravenous contrast. RADIATION DOSE REDUCTION: This exam was performed according to the departmental dose-optimization program which includes automated exposure control,  adjustment of the mA and/or kV according to patient size and/or use of iterative reconstruction technique. COMPARISON:  None Available. FINDINGS: Brain: Cerebral volume within normal limits. No acute intracranial hemorrhage. No acute large vessel territory infarct. No mass lesion, midline shift or mass effect. No hydrocephalus or extra-axial fluid collection. Vascular: No abnormal hyperdense vessel.  Skull: Scalp soft tissues and calvarium within normal limits. Sinuses/Orbits: Globes orbital soft tissues within normal limits. Paranasal sinuses are largely clear. No mastoid effusion. Other: None. ASPECTS [facility] Stroke Program Early CT Score) - Ganglionic level infarction (caudate, lentiform nuclei, internal capsule, insula, M1-M3 cortex): 7 - Supraganglionic infarction (M4-M6 cortex): 3 Total score (0-10 with 10 being normal): 10 IMPRESSION: 1. No acute intracranial abnormality. 2. ASPECTS is 10. Critical Value/emergent results were called by telephone at the time of interpretation on 03/03/2022 at 9:01 pm to provider [facility] , who verbally acknowledged these results. Electronically Signed   By: Jeannine Boga M.D.   On: 03/03/2022 21:02     Assessment and Plan:   Severe Aortic Stenosis s/p TAVR in 04/2019 Admitted for TIA/stroke work-up after presenting with severe headache and facial numbness. Work-up showed aneurysm of MCA but no evidence of infarct. Felt to possibly be a complex migraine per patient. Echo with bubble study showed  65-7% with no evidence of any intra-atrial shunt.  However, it did show an increase in the mean gradient across the aortic valve from 9 mmHg to 14.5 mmHg and there was concern for possible leaflet thrombosis. Reviewed Echo results with Dr. Sallyanne Kuster. He went back and looked at several Echos. No significant change in gradient and she is not felt to have a thrombosis of her prosthetic valve. No additional work-up necessary at this time. Okay for discharge from a cardiac  standpoint.  Atypical Chest Pain Patient has had intermittent atypical chest pain for several years dating back to before TAVR. These occur randomly and last for about 10 minutes. Pain often radiates to back. However, she also describes chest wall tenderness with palpation during these times. Prior chest CTA in during one of these episodes has been negative for any acute intraaortic pathology and cardiac catheterization in 03/2019 prior to TAVR showed normal coronaries. She continues to have intermittent episodes of this but sounds stable. Currently pain free. Symptoms sounds atypical and likely musculoskeletal (given post-polio syndrome, patient has to lift and pull herself up frequently to get in and out of wheelchair). No additional cardiac work-up necessary at this time.  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  I have reviewed the chart, notes and new data.  I agree with PA/NP's note.  Key new complaints: unclear that her symptoms truly represent a stroke. Largely resolved now Key examination changes: normal CV exam except for a barely audible, early peaking aortic ejection murmur. Key new findings / data: reviewed the current echo, the one from July and multiple previous echos, including the one performed the day after TAVR in 2020. I do not think that there has been any true change in valve hemodynamics. Although there is a slight change in gradients, the dimensionless index is  very steady at 0.40-0.48 (0.43 on current study). Similarly the AV acceleration time is almost exactly 70 ms on all studies. This means that the changes in gradients are due to cardiac output changes, not a change  in valve performance. (Some AV area calculations are also different due to errors in measuring the LVOT diameter). De novo thrombosis of the bioprosthesis 3 years after implantation would be very uncommon.  PLAN: I think the TAVR is functioning normally and there is no indication for TEE or CT. OK for DC from our point of view. No change in cardiac meds is recommended. Follow up as previously scheduled.  Sanda Klein, MD, [place] [phone] 03/05/2022, 8:25 AM

## 2022-03-04 NOTE — Discharge Instructions (Signed)
Follow with Primary MD [name], MD in 7 days   Get CBC, CMP,  checked  by Primary MD next visit.    Activity: As tolerated with Full fall precautions use walker/cane & assistance as needed   Disposition Home    Diet: Heart Healthy   On your next visit with your primary care physician please Get Medicines reviewed and adjusted.   Please request your Prim.MD to go over all Hospital Tests and Procedure/Radiological results at the follow up, please get all Hospital records sent to your Prim MD by signing hospital release before you go home.   If you experience worsening of your admission symptoms, develop shortness of breath, life threatening emergency, suicidal or homicidal thoughts you must seek medical attention immediately by calling 911 or calling your MD immediately  if symptoms less severe.  You Must read complete instructions/literature along with all the possible adverse reactions/side effects for all the Medicines you take and that have been prescribed to you. Take any new Medicines after you have completely understood and accpet all the possible adverse reactions/side effects.   Do not drive, operating heavy machinery, perform activities at heights, swimming or participation in water activities or provide baby sitting services if your were admitted for syncope or siezures until you have seen by Primary MD or a Neurologist and advised to do so again.  Do not drive when taking Pain medications.    Do not take more than prescribed Pain, Sleep and Anxiety Medications  Special Instructions: If you have smoked or chewed Tobacco  in the last 2 yrs please stop smoking, stop any regular Alcohol  and or any Recreational drug use.  Wear Seat belts while driving.   Please note  You were cared for by a hospitalist during your hospital stay. If you have any questions about your discharge medications or the care you received while you were in the hospital after you are discharged,  you can call the unit and asked to speak with the hospitalist on call if the hospitalist that took care of you is not available. Once you are discharged, your primary care physician will handle any further medical issues. Please note that NO REFILLS for any discharge medications will be authorized once you are discharged, as it is imperative that you return to your primary care physician (or establish a relationship with a primary care physician if you do not have one) for your aftercare needs so that they can reassess your need for medications and monitor your lab values.

## 2022-03-04 NOTE — H&P (Signed)
History and Physical  [name] KPT:[id number] DOB: 23-Feb-1956 DOA: 03/03/2022  Referring physician: Accepted by Dr. Myna Hidalgo, [facility]  PCP: [name], MD  Outpatient Specialists: Neurology, Cardiology. Patient coming from: Home  Chief Complaint: R facial numbness.   HPI: [name] is a 66 y.o. 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 Doran Durand, who initially presented to [facility] ED with complaints of right-sided facial numbness since 1430 on 03/03/2022.  Associated with headache and brain fog with onset the day before.    In the ED, she was evaluated by teleneurology, Dr. Nicoletta [facility].  At the time of her evaluation, patient had tenderness with palpation to the temple which raised suspicion of giant cell arteritis.  CT head was nonacute.  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  Teleneurology recommended admission for stroke work-up and to rule out giant cell arteritis.  The patient was admitted and transferred to [facility] for further evaluation and management.  Sed rate returned mildly elevated 30.  At the time of this visit, the patient's headache had resolved, after receiving headache cocktail at [facility] ED.  Her right facial numbness persists.  Ongoing stroke work-up in place.  ED Course: Tmax 98.6.  BP 137/80, pulse 87, respiratory 20, O2 saturation 95% on room air.  Serum glucose 122.  CBC essentially unremarkable except for eosinophilia.  Review of Systems: Review of systems as noted in the HPI. All other systems reviewed and are negative.   Past Medical History:  Diagnosis Date   Allergy    seasonal allergies   Anxiety    on meds   Arthritis    low back issues   Carpal tunnel  syndrome    LEFT   Cataract    bilateral -sx   Cervical pain (neck)    Complication of anesthesia    DIFFICULTY WAKING UP   Coronary artery disease    Depression    on meds   GERD (gastroesophageal reflux disease)    on meds   Hyperlipidemia    diet controlled- not on meds at this time (08/05/2020)   Insomnia    Mass of right lower leg    Morbid obesity (HCC)    OSA on CPAP    uses CPAP nightly   Osteopenia    neck   PONV (postoperative nausea and vomiting)    Post-polio syndrome    With left leg paralysis   S/P TAVR (transcatheter aortic valve replacement)    Sigmoid diverticulosis    Ulnar nerve abnormality    Past Surgical History:  Procedure Laterality Date   AMPUTATION TOE Left 02/18/2022   Procedure: Second and Third distal interphalangeal and Fourth proximal interphalangeal  toe amputations;  Surgeon: Wylene Simmer, MD;  Location: [place];  Service: Orthopedics;  Laterality: Left;   ANKLE FUSION     left   APPENDECTOMY     BIOPSY  05/04/2021   Procedure: BIOPSY;  Surgeon: Rush Landmark Telford Nab., MD;  Location: [facility];  Service: Gastroenterology;;   CHOLECYSTECTOMY     COLONOSCOPY     COLONOSCOPY N/A 05/04/2021   Procedure: COLONOSCOPY;  Surgeon: Irving Copas., MD;  Location: [facility];  Service: Gastroenterology;  Laterality: N/A;   ESOPHAGOGASTRODUODENOSCOPY (EGD) WITH  PROPOFOL N/A 05/04/2021   Procedure: ESOPHAGOGASTRODUODENOSCOPY (EGD) WITH PROPOFOL;  Surgeon: Rush Landmark Telford Nab., MD;  Location: [facility];  Service: Gastroenterology;  Laterality: N/A;   EYE SURGERY Bilateral 2020   cataracts removed August and September 2020   JOINT REPLACEMENT Right 2003   three replacements; 2003 is last one   KNEE ARTHROPLASTY     LEG SURGERY     muscle surgery related to polio   MASS EXCISION Right 01/08/2020   Procedure: EXCISION RIGHT LEG MASS;  Surgeon: Erroll Luna, MD;  Location: [place];  Service:  General;  Laterality: Right;   POLYPECTOMY  05/04/2021   Procedure: POLYPECTOMY;  Surgeon: Irving Copas., MD;  Location: [facility];  Service: Gastroenterology;;   RECTAL PROLAPSE REPAIR  2020   pessary placed   RIGHT/LEFT HEART CATH AND CORONARY ANGIOGRAPHY N/A 04/02/2019   Procedure: RIGHT/LEFT HEART CATH AND CORONARY ANGIOGRAPHY;  Surgeon: Nelva Bush, MD;  Location: [place] [facility];  Service: Cardiovascular;  Laterality: N/A;   SAVORY DILATION N/A 05/04/2021   Procedure: SAVORY DILATION;  Surgeon: Rush Landmark Telford Nab., MD;  Location: [facility];  Service: Gastroenterology;  Laterality: N/A;   TEE WITHOUT CARDIOVERSION N/A 04/17/2019   Procedure: TRANSESOPHAGEAL ECHOCARDIOGRAM (TEE);  Surgeon: Sherren Mocha, MD;  Location: [place] [facility];  Service: Open Heart Surgery;  Laterality: N/A;   TOTAL HIP ARTHROPLASTY     right x 3   TRANSCATHETER AORTIC VALVE REPLACEMENT, TRANSFEMORAL N/A 04/17/2019   Procedure: TRANSCATHETER AORTIC VALVE REPLACEMENT, TRANSFEMORAL;  Surgeon: Sherren Mocha, MD;  Location: [place] [facility];  Service: Open Heart Surgery;  Laterality: N/A;   UMBILICAL HERNIA REPAIR      Social History:  reports that she has never smoked. She has never used smokeless tobacco. She reports that she does not drink alcohol and does not use drugs.   Allergies  Allergen Reactions   Amoxicillin Anaphylaxis and Swelling    Did it involve swelling of the face/tongue/throat, SOB, or low BP? Yes Did it involve sudden or severe rash/hives, skin peeling, or any reaction on the inside of your mouth or nose? No Did you need to seek medical attention at a hospital or doctor's office? Yes When did it last happen?    yrs ago   If all above answers are "NO", may proceed with cephalosporin use.    Flagyl [Metronidazole] Anaphylaxis   Plaquenil [Hydroxychloroquine Sulfate] Other (See Comments)    Blurred vision, joint pain   Codeine Nausea Only   Doxycycline  Nausea And Vomiting   Other Nausea Only    Tylenol with Codeine #3   Percocet [Oxycodone-Acetaminophen] Nausea Only   Sulfa Antibiotics Nausea Only    Family History  Problem Relation Age of Onset   Ovarian cancer Mother 69   Colon cancer Mother 17       mets from ovarian CA   Heart disease Father    CAD Other        FAM Hx OF CAD   Diabetes Other        FAM Hx of DM   Hypertension Other    Esophageal cancer Neg Hx    Rectal cancer Neg Hx    Stomach cancer Neg Hx    Inflammatory bowel disease Neg Hx    Liver disease Neg Hx    Pancreatic cancer Neg Hx       Prior to Admission medications   Medication Sig Start Date End Date Taking? Authorizing Provider  ALPRAZolam Duanne Moron)  0.5 MG tablet Take 0.5 mg by mouth 2 (two) times daily as needed for sleep.     [provider]  Aspirin 81 MG CAPS Take 1 mg by mouth 1 day or 1 dose.    [provider]  cetirizine (ZYRTEC) 10 MG tablet Take 10 mg by mouth as needed for allergies.    [provider]  Cholecalciferol 50 MCG (2000 UT) TABS Take 2,000 Units by mouth daily.     [provider]  cyclobenzaprine (FLEXERIL) 10 MG tablet Take 1 tablet (10 mg total) by mouth 2 (two) times daily as needed for muscle spasms. 01/10/22   Dorie Rank, MD  docusate sodium (COLACE) 100 MG capsule Take 200 mg by mouth daily.    [provider]  docusate sodium (COLACE) 100 MG capsule Take 1 capsule (100 mg total) by mouth 2 (two) times daily. While taking narcotic pain medicine. 02/18/22   Corky Sing, PA-C  fluticasone [facility]) 50 MCG/ACT nasal spray Place 1 spray into both nostrils as needed for allergies or rhinitis.    [provider]  gabapentin (NEURONTIN) 300 MG capsule Take 300 mg by mouth 2 (two) times daily.    [provider]  lidocaine (LIDODERM) 5 % Place 1 patch onto the skin daily. Remove & Discard patch within 12 hours or as directed by MD 01/10/22   Dorie Rank, MD  Loteprednol  Etabonate (EYSUVIS) 0.25 % SUSP Place 1 drop into both eyes 3 (three) times a week.    [provider]  ondansetron (ZOFRAN) 4 MG tablet Take 1 tablet (4 mg total) by mouth every 8 (eight) hours as needed for nausea or vomiting. 02/18/22   Corky Sing, PA-C  pantoprazole (PROTONIX) 40 MG tablet Take 40 mg by mouth daily.    [provider]  Propylene Glycol (SYSTANE BALANCE) 0.6 % SOLN Place 1 drop into both eyes in the morning, at noon, in the evening, and at bedtime.    [provider]  rosuvastatin (CRESTOR) 20 MG tablet Take 1 tablet (20 mg total) by mouth daily. 12/21/21   [facility], Tami Lin, PA  senna (SENOKOT) 8.6 MG TABS tablet Take 2 tablets (17.2 mg total) by mouth 2 (two) times daily. 02/18/22   Corky Sing, PA-C  traZODone (DESYREL) 150 MG tablet Take 150 mg by mouth at bedtime.    [provider]  venlafaxine XR (EFFEXOR-XR) 75 MG 24 hr capsule Take 75 mg by mouth daily with breakfast.    [provider]  vitamin B-12 (CYANOCOBALAMIN) 250 MCG tablet Take 250 mcg by mouth daily.    [provider]    Physical Exam: BP 137/80   Pulse 84   Temp 98.4 F (36.9 C) (Oral)   Resp 20   Ht '5\' 8"'$  (1.727 m)   Wt 124.6 kg   SpO2 99%   BMI 41.77 kg/m   General: 66 y.o. year-old female well developed well nourished in no acute distress.  Alert and oriented x3. Cardiovascular: Regular rate and rhythm with no rubs or gallops.  No thyromegaly or JVD noted.  No lower extremity edema. 2/4 pulses in all 4 extremities. Respiratory: Clear to auscultation with no wheezes or rales. Good inspiratory effort. Abdomen: Soft nontender nondistended with normal bowel sounds x4 quadrants. Muskuloskeletal: No cyanosis, clubbing or edema noted bilaterally.  Left foot in surgical dressing. Neuro: CN II-XII intact, strength, reflexes.  Right cheek numbness. Skin: No ulcerative lesions noted or rashes Psychiatry: Judgement  and insight appear  normal. Mood is appropriate for condition and setting          Labs on Admission:  Basic Metabolic Panel: Recent Labs  Lab 03/03/22 2115  NA 137  K 4.3  CL 103  CO2 25  GLUCOSE 122*  BUN 10  CREATININE 0.74  CALCIUM 8.8*   Liver Function Tests: Recent Labs  Lab 03/03/22 2115  AST 20  ALT 12  ALKPHOS 88  BILITOT 0.3  PROT 6.7  ALBUMIN 3.7   No results for input(s): "LIPASE", "AMYLASE" in the last 168 hours. No results for input(s): "AMMONIA" in the last 168 hours. CBC: Recent Labs  Lab 03/03/22 2115  WBC 9.3  NEUTROABS 4.4  HGB 13.5  HCT 39.4  MCV 92.3  PLT 265   Cardiac Enzymes: No results for input(s): "CKTOTAL", "CKMB", "CKMBINDEX", "TROPONINI" in the last 168 hours.  BNP (last 3 results) No results for input(s): "BNP" in the last 8760 hours.  ProBNP (last 3 results) No results for input(s): "PROBNP" in the last 8760 hours.  CBG: Recent Labs  Lab 03/03/22 2109  GLUCAP 129*    Radiological Exams on Admission: CT ANGIO HEAD NECK W WO CM  Result Date: 03/03/2022 CLINICAL DATA:  Initial evaluation for headache, brain fog. Facial numbness. EXAM: CT ANGIOGRAPHY HEAD AND NECK TECHNIQUE: Multidetector CT imaging of the head and neck was performed using the standard protocol during bolus administration of intravenous contrast. Multiplanar CT image reconstructions and MIPs were obtained to evaluate the vascular anatomy. Carotid stenosis measurements (when applicable) are obtained utilizing NASCET criteria, using the distal internal carotid diameter as the denominator. RADIATION DOSE REDUCTION: This exam was performed according to the departmental dose-optimization program which includes automated exposure control, adjustment of the mA and/or kV according to patient size and/or use of iterative reconstruction technique. CONTRAST:  10m OMNIPAQUE IOHEXOL 350 MG/ML SOLN COMPARISON:  CT from earlier the same day. FINDINGS: CTA NECK FINDINGS Aortic arch: Visualized  aortic arch normal in caliber. Bovine branching pattern noted. No stenosis about the origin of the great vessels. Right carotid system: Right common and internal carotid arteries are patent without stenosis, dissection or occlusion. Left carotid system: Left common and internal carotid arteries are patent without stenosis, dissection or occlusion. Vertebral arteries: Both vertebral arteries arise from subclavian arteries. No proximal subclavian artery stenosis. Vertebral arteries patent without stenosis, dissection or occlusion. Skeleton: No discrete or worrisome osseous lesions. Moderate spondylosis present at C4-5 through C6-7. Degenerative changes noted about the right TMJ. Other neck: No other acute soft tissue abnormality within the neck. Upper chest: Visualized upper chest demonstrates no acute finding. Review of the MIP images confirms the above findings CTA HEAD FINDINGS Anterior circulation: Both internal carotid arteries patent to the termini without stenosis. A1 segments patent bilaterally. Negative anterior communicating artery complex. Both ACAs patent to their distal aspects without stenosis. No M1 stenosis or occlusion. 5-6 mm saccular aneurysm seen arising from the right MCA bifurcation (series 8, image 103). No proximal MCA branch occlusion. Distal MCA branches perfused and symmetric. Posterior circulation: Both V4 segments patent without stenosis. Both PICA patent. Basilar patent to its distal aspect without stenosis. Superior cerebral arteries patent bilaterally. Both PCAs primarily supplied via the basilar well perfused or distal aspects. Venous sinuses: Patent allowing for timing the contrast bolus. DVA noted at the right cerebellum. Anatomic variants: None significant. Review of the MIP images confirms the above findings IMPRESSION: 1. Negative CTA of the head and neck for large  vessel occlusion or other emergent finding. No significant atheromatous disease for age. No hemodynamically  significant or correctable stenosis. 2. 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 Electronically Signed   By: Jeannine Boga M.D.   On: 03/03/2022 22:00   CT HEAD CODE STROKE WO CONTRAST  Result Date: 03/03/2022 CLINICAL DATA:  Code stroke. Initial evaluation for neuro deficit, stroke suspected. EXAM: CT HEAD WITHOUT CONTRAST TECHNIQUE: Contiguous axial images were obtained from the base of the skull through the vertex without intravenous contrast. RADIATION DOSE REDUCTION: This exam was performed according to the departmental dose-optimization program which includes automated exposure control, adjustment of the mA and/or kV according to patient size and/or use of iterative reconstruction technique. COMPARISON:  None Available. FINDINGS: Brain: Cerebral volume within normal limits. No acute intracranial hemorrhage. No acute large vessel territory infarct. No mass lesion, midline shift or mass effect. No hydrocephalus or extra-axial fluid collection. Vascular: No abnormal hyperdense vessel. Skull: Scalp soft tissues and calvarium within normal limits. Sinuses/Orbits: Globes orbital soft tissues within normal limits. Paranasal sinuses are largely clear. No mastoid effusion. Other: None. ASPECTS [facility] Stroke Program Early CT Score) - Ganglionic level infarction (caudate, lentiform nuclei, internal capsule, insula, M1-M3 cortex): 7 - Supraganglionic infarction (M4-M6 cortex): 3 Total score (0-10 with 10 being normal): 10 IMPRESSION: 1. No acute intracranial abnormality. 2. ASPECTS is 10. Critical Value/emergent results were called by telephone at the time of interpretation on 03/03/2022 at 9:01 pm to provider [facility] , who verbally acknowledged these results. Electronically Signed   By: Jeannine Boga M.D.   On: 03/03/2022 21:02    EKG: I independently viewed the EKG done and my findings are as followed: Sinus rhythm rate of 84.  Nonspecific ST-T changes.  QTc  496.  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  Follow MRI brain, 2D echo with bubble study Permissive hypertension until stroke is ruled out Frequent neuro checks Fasting lipid panel, hemoglobin A1c PT/OT/speech therapist assessment Neurology will follow in the morning. Antiplatelets deferred to neurology.  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  Plan per neurology  Headache Transient pain in the temple Sed rate 30, mildly elevated, low suspicion for GCA Continue supportive care for now  Hyperlipidemia Resume home Crestor If MRI positive for stroke, adjust dose.  Chronic anxiety/depression Resume home Effexor XL  Severe morbid obesity BMI 41 Recommend weight loss outpatient with regular physical activity and healthy dieting.  OSA on CPAP Continue CPAP nightly.    DVT prophylaxis: SCDs  Code Status: Full code  Family Communication: Updated her brother at bedside.  Disposition Plan: Admitted to telemetry medical unit.  Accepted by Dr. Myna Hidalgo.  Consults called: Neurology  Admission status: Observation status.   Status is: Observation    Kayleen Memos MD [facility] Pager [phone]  If 7PM-7AM, please contact night-coverage [email or web address] Password [email or web address]  03/04/2022, 2:08 AM

## 2022-03-04 NOTE — ED Notes (Signed)
Report given to [facility] RN @ [facility]

## 2022-03-04 NOTE — Progress Notes (Addendum)
PROGRESS NOTE    [name]  ELT:[id number] DOB: [birth year] DOA: 03/03/2022 PCP: [name], MD    Chief Complaint  Patient presents with   Numbness    Brief Narrative:   This is a no charge note as patient was seen and admitted earlier today by Dr. Nevada Crane, chart, imaging labs were reviewed, patient was seen and examined  [name] is a 66 y.o. 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 Doran Durand, who initially presented to [facility] ED with complaints of right-sided facial numbness since 1430 on 03/03/2022.  Associated with headache and brain fog with onset the day before.  She was seen by teleneurology, who recommended admission for TIA/CVA work-up.      Assessment & Plan:   Principal Problem:   Right facial numbness Active Problems:   Obesity (BMI 35.0-39.9 without comorbidity)   Hyperlipidemia   GERD (gastroesophageal reflux disease)   Depression with anxiety   Severe aortic stenosis   OSA on CPAP   Chronic diastolic CHF (congestive heart failure) (HCC)   S/P TAVR (transcatheter aortic valve replacement)   Complicated migraine   Right facial numbness/headache -Neurology input greatly appreciated, finding most likely related to complicated migraine -MRI brain with no acute infarct - CT head without acute findings - CTA head and neck was negative for LVO but showed 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  -Interventional neurology will arrange for outpatient follow-up    Hyperlipidemia Resume home Crestor  Chronic anxiety/depression Resume home Effexor XL   Severe morbid obesity BMI 41 Recommend weight loss outpatient with regular physical activity and healthy dieting.   OSA on CPAP Continue CPAP nightly.   DVT prophylaxis: SCD's Code Status: Full Family Communication: Husband at bedside Disposition:   Status is: Observation    Consultants:  Neurology Cardiology   Subjective:   Objective: Vitals:   03/04/22 0000 03/04/22 0030 03/04/22 0400 03/04/22 0831  BP: 131/71 137/80 (!) 122/94 137/82  Pulse: 81 84 70 73  Resp: (!) _0 Temp:   98.3 F (36.8 C) 97.9 F (36.6 C)  TempSrc:   Oral Oral  SpO2: 99% 99% 99% 99%  Weight:      Height:        Intake/Output Summary (Last 24 hours) at 03/04/2022 1602 Last data filed at 03/04/2022 1513 Gross per 24 hour  Intake 325.25 ml  Output --  Net 325.25 ml   Filed Weights   03/03/22 2034 03/03/22 2121  Weight: 117.9 kg 124.6 kg    Examination:  Awake Alert, Oriented X 3, No new F.N deficits, Normal affect Symmetrical Chest wall movement, Good air movement bilaterally, CTAB RRR,No Gallops,Rubs  +ve B.Sounds, Abd Soft, No tenderness, No rebound - guarding or rigidity.  No Cyanosis, Clubbing or edema, No new Rash or bruise       Data Reviewed: I have personally reviewed following labs and imaging studies  CBC: Recent Labs  Lab 03/03/22 2115  WBC 9.3  NEUTROABS 4.4  HGB 13.5  HCT 39.4  MCV 92.3  PLT 203    Basic Metabolic Panel: Recent Labs  Lab 03/03/22 2115  NA 137  K 4.3  CL 103  CO2 25  GLUCOSE 122*  BUN 10  CREATININE 0.74   CALCIUM 8.8*    GFR: Estimated Creatinine Clearance: 93.7 mL/min (by C-G formula based on SCr of 0.74 mg/dL).  Liver Function Tests: Recent Labs  Lab 03/03/22 2115  AST 20  ALT 12  ALKPHOS 88  BILITOT 0.3  PROT 6.7  ALBUMIN 3.7    CBG: Recent Labs  Lab 03/03/22 2109  GLUCAP 129*     Recent Results (from the past 240 hour(s))  Resp Panel by RT-PCR (Flu A&B, Covid) Anterior Nasal Swab     Status: None   Collection Time: 03/03/22  8:44 PM   Specimen: Anterior Nasal Swab  Result Value Ref Range Status   SARS Coronavirus 2 by RT PCR NEGATIVE NEGATIVE Final    Comment: (NOTE) SARS-CoV-2 target nucleic acids are NOT DETECTED.  The SARS-CoV-2 RNA is generally detectable in upper respiratory specimens during the acute phase of infection. The lowest concentration of SARS-CoV-2 viral copies this assay can detect is 138 copies/mL. A negative result does not preclude SARS-Cov-2 infection and should not be used as the sole basis for treatment or other patient management decisions. A negative result may occur with  improper specimen collection/handling, submission of specimen other than nasopharyngeal swab, presence of viral mutation(s) within the areas targeted by this assay, and inadequate number of viral copies(<138 copies/mL). A negative result must be combined with clinical observations, patient history, and epidemiological information. The expected result is Negative.  Fact Sheet for Patients:  EntrepreneurPulse.com.au  Fact Sheet for Healthcare Providers:  IncredibleEmployment.be  This test is no t yet approved or cleared by the [place] FDA and  has been authorized for detection and/or diagnosis of SARS-CoV-2 by FDA under an Emergency Use Authorization (EUA). This EUA will remain  in effect (meaning this test can be used) for the duration of the COVID-19 declaration under Section 564(b)(1) of the Act, 21 U.S.C.section  360bbb-3(b)(1), unless the authorization is terminated  or revoked sooner.       Influenza A by PCR NEGATIVE NEGATIVE Final   Influenza B by PCR NEGATIVE NEGATIVE Final    Comment: (NOTE) The Xpert Xpress SARS-CoV-2/FLU/RSV plus assay is intended as an aid in the diagnosis of influenza from Nasopharyngeal swab specimens and should not be used as a sole basis for treatment. Nasal washings and aspirates are unacceptable for Xpert Xpress SARS-CoV-2/FLU/RSV testing.  Fact Sheet for Patients: EntrepreneurPulse.com.au  Fact Sheet for Healthcare Providers: IncredibleEmployment.be  This test is not yet approved or cleared by the [place] FDA and has been authorized for detection and/or diagnosis of SARS-CoV-2 by FDA under an Emergency Use Authorization (EUA). This EUA will remain in effect (meaning this test can be used) for the duration of the COVID-19 declaration under Section 564(b)(1) of the Act, 21 U.S.C. section 360bbb-3(b)(1), unless the authorization is terminated or revoked.  Performed at [facility], [address], [place], [place] [address]          Radiology Studies: ECHOCARDIOGRAM COMPLETE BUBBLE STUDY  Result Date: 03/04/2022  ECHOCARDIOGRAM REPORT   Patient Name:   [name] Date of Exam: 03/04/2022 Medical Rec #:  [id number]         Height:       68.0 in Accession #:    [id number]        Weight:       274.7 lb Date of Birth:  1956-01-10         BSA:          2.339 m Patient Age:    24 years          BP:           137/82 mmHg Patient Gender: F                 HR:           73 bpm. Exam Location:  Inpatient Procedure: 2D Echo, Cardiac Doppler, Color Doppler and Saline Contrast Bubble            Study Indications:    Stroke  History:        Patient has prior history of Echocardiogram examinations, most                 recent 01/05/2022. CAD; Risk Factors:Dyslipidemia.                 Aortic Valve: 26 mm  Sapien prosthetic, stented (TAVR) valve is                 present in the aortic position. Procedure Date: 04/17/2019.  Sonographer:    Jefferey Pica Referring Phys: [id number] CAROLE N HALL IMPRESSIONS  1. 26 mm S3. Vmax 2.6 m/s, MG 14.5 mmHG, EOA 1.59 cm2, DI 0.46. Mean gradients has increased from 9 mmHG to 14.5 mmHG. Would recommend TEE or gated cardiac CTA to exclude leaflet thrombosis. The aortic valve has been repaired/replaced. Aortic valve regurgitation is not visualized. There is a 26 mm Sapien prosthetic (TAVR) valve present in the aortic position. Procedure Date: 04/17/2019.  2. Left ventricular ejection fraction, by estimation, is 65 to 70%. The left ventricle has normal function. The left ventricle has no regional wall motion abnormalities. There is moderate concentric left ventricular hypertrophy. Left ventricular diastolic parameters are consistent with Grade I diastolic dysfunction (impaired relaxation).  3. Right ventricular systolic function is normal. The right ventricular size is normal. Tricuspid regurgitation signal is inadequate for assessing PA pressure.  4. The mitral valve is grossly normal. No evidence of mitral valve regurgitation. No evidence of mitral stenosis.  5. The inferior vena cava is dilated in size with >50% respiratory variability, suggesting right atrial pressure of 8 mmHg.  6. Agitated saline contrast bubble study was negative, with no evidence of any interatrial shunt. FINDINGS  Left Ventricle: Left ventricular ejection fraction, by estimation, is 65 to 70%. The left ventricle has normal function. The left ventricle has no regional wall motion abnormalities. The left ventricular internal cavity size was normal in size. There is  moderate concentric left ventricular hypertrophy. Left ventricular diastolic parameters are consistent with Grade I diastolic dysfunction (impaired relaxation). Right Ventricle: The right ventricular size is normal. No increase in right ventricular  wall thickness. Right ventricular systolic function is normal. Tricuspid regurgitation signal is inadequate for assessing PA pressure. Left Atrium: Left atrial size was normal in size. Right Atrium: Right atrial size was normal in size. Pericardium: There is no evidence of pericardial effusion. Presence of epicardial fat layer. Mitral Valve: The mitral valve is  grossly normal. No evidence of mitral valve regurgitation. No evidence of mitral valve stenosis. Tricuspid Valve: The tricuspid valve is grossly normal. Tricuspid valve regurgitation is trivial. No evidence of tricuspid stenosis. Aortic Valve: 26 mm S3. Vmax 2.6 m/s, MG 14.5 mmHG, EOA 1.59 cm2, DI 0.46. Mean gradients has increased from 9 mmHG to 14.5 mmHG. Would recommend TEE or gated cardiac CTA to exclude leaflet thrombosis. The aortic valve has been repaired/replaced. Aortic valve regurgitation is not visualized. Aortic valve mean gradient measures 14.5 mmHg. Aortic valve peak gradient measures 27.1 mmHg. Aortic valve area, by VTI measures 1.59 cm. There is a 26 mm Sapien prosthetic, stented (TAVR) valve present in the aortic position. Procedure Date: 04/17/2019. Pulmonic Valve: The pulmonic valve was grossly normal. Pulmonic valve regurgitation is not visualized. No evidence of pulmonic stenosis. Aorta: The aortic root and ascending aorta are structurally normal, with no evidence of dilitation. Venous: The inferior vena cava is dilated in size with greater than 50% respiratory variability, suggesting right atrial pressure of 8 mmHg. IAS/Shunts: The atrial septum is grossly normal. Agitated saline contrast was given intravenously to evaluate for intracardiac shunting. Agitated saline contrast bubble study was negative, with no evidence of any interatrial shunt.  LEFT VENTRICLE PLAX 2D LVIDd:         4.80 cm   Diastology LVIDs:         2.60 cm   LV e' medial:    5.68 cm/s LV PW:         1.40 cm   LV E/e' medial:  8.6 LV IVS:        1.30 cm   LV e' lateral:    5.35 cm/s LVOT diam:     2.10 cm   LV E/e' lateral: 9.2 LV SV:         92 LV SV Index:   39 LVOT Area:     3.46 cm  RIGHT VENTRICLE             IVC RV Basal diam:  2.80 cm     IVC diam: 2.20 cm RV S prime:     12.80 cm/s TAPSE (M-mode): 2.4 cm LEFT ATRIUM             Index        RIGHT ATRIUM           Index LA diam:        4.40 cm 1.88 cm/m   RA Area:     13.75 cm LA Vol (A2C):   36.4 ml 15.56 ml/m  RA Volume:   32.00 ml  13.68 ml/m LA Vol (A4C):   50.8 ml 21.72 ml/m LA Biplane Vol: 46.5 ml 19.88 ml/m  AORTIC VALVE                     PULMONIC VALVE AV Area (Vmax):    1.62 cm      PV Vmax:       0.91 m/s AV Area (Vmean):   1.39 cm      PV Peak grad:  3.3 mmHg AV Area (VTI):     1.59 cm AV Vmax:           260.50 cm/s AV Vmean:          177.500 cm/s AV VTI:            0.576 m AV Peak Grad:      27.1 mmHg AV Mean Grad:      14.5  mmHg LVOT Vmax:         122.00 cm/s LVOT Vmean:        71.400 cm/s LVOT VTI:          0.265 m LVOT/AV VTI ratio: 0.46  AORTA Ao Asc diam: 3.10 cm MITRAL VALVE MV Area (PHT): 4.19 cm    SHUNTS MV Decel Time: 181 msec    Systemic VTI:  0.26 m MV E velocity: 49.10 cm/s  Systemic Diam: 2.10 cm MV A velocity: 80.00 cm/s MV E/A ratio:  0.61 Eleonore Chiquito MD Electronically signed by Eleonore Chiquito MD Signature Date/Time: 03/04/2022/12:56:59 PM    Final    MR BRAIN WO CONTRAST  Result Date: 03/04/2022 CLINICAL DATA:  Neuro deficit with acute stroke suspected. EXAM: MRI HEAD WITHOUT CONTRAST TECHNIQUE: Multiplanar, multiecho pulse sequences of the brain and surrounding structures were obtained without intravenous contrast. COMPARISON:  CT and CTA from yesterday FINDINGS: Brain: No acute infarction, hemorrhage, hydrocephalus, extra-axial collection or mass lesion. Vascular: Preserved flow voids. Bulbous appearance at the right MCA bifurcation, known aneurysm from prior CTA. Skull and upper cervical spine: Cervical facet spurring with C3-4 anterolisthesis. No focal marrow lesion.  Sinuses/Orbits: Bilateral cataract resection. Generalized mild mucosal thickening in the paranasal sinuses IMPRESSION: 1. No acute finding.  Negative for infarct. 2. Right MCA aneurysm, see prior CTA. Electronically Signed   By: Jorje Guild M.D.   On: 03/04/2022 05:11   CT ANGIO HEAD NECK W WO CM  Result Date: 03/03/2022 CLINICAL DATA:  Initial evaluation for headache, brain fog. Facial numbness. EXAM: CT ANGIOGRAPHY HEAD AND NECK TECHNIQUE: Multidetector CT imaging of the head and neck was performed using the standard protocol during bolus administration of intravenous contrast. Multiplanar CT image reconstructions and MIPs were obtained to evaluate the vascular anatomy. Carotid stenosis measurements (when applicable) are obtained utilizing NASCET criteria, using the distal internal carotid diameter as the denominator. RADIATION DOSE REDUCTION: This exam was performed according to the departmental dose-optimization program which includes automated exposure control, adjustment of the mA and/or kV according to patient size and/or use of iterative reconstruction technique. CONTRAST:  13m OMNIPAQUE IOHEXOL 350 MG/ML SOLN COMPARISON:  CT from earlier the same day. FINDINGS: CTA NECK FINDINGS Aortic arch: Visualized aortic arch normal in caliber. Bovine branching pattern noted. No stenosis about the origin of the great vessels. Right carotid system: Right common and internal carotid arteries are patent without stenosis, dissection or occlusion. Left carotid system: Left common and internal carotid arteries are patent without stenosis, dissection or occlusion. Vertebral arteries: Both vertebral arteries arise from subclavian arteries. No proximal subclavian artery stenosis. Vertebral arteries patent without stenosis, dissection or occlusion. Skeleton: No discrete or worrisome osseous lesions. Moderate spondylosis present at C4-5 through C6-7. Degenerative changes noted about the right TMJ. Other neck: No other  acute soft tissue abnormality within the neck. Upper chest: Visualized upper chest demonstrates no acute finding. Review of the MIP images confirms the above findings CTA HEAD FINDINGS Anterior circulation: Both internal carotid arteries patent to the termini without stenosis. A1 segments patent bilaterally. Negative anterior communicating artery complex. Both ACAs patent to their distal aspects without stenosis. No M1 stenosis or occlusion. 5-6 mm saccular aneurysm seen arising from the right MCA bifurcation (series 8, image 103). No proximal MCA branch occlusion. Distal MCA branches perfused and symmetric. Posterior circulation: Both V4 segments patent without stenosis. Both PICA patent. Basilar patent to its distal aspect without stenosis. Superior cerebral arteries patent bilaterally. Both PCAs primarily supplied via the  basilar well perfused or distal aspects. Venous sinuses: Patent allowing for timing the contrast bolus. DVA noted at the right cerebellum. Anatomic variants: None significant. Review of the MIP images confirms the above findings IMPRESSION: 1. Negative CTA of the head and neck for large vessel occlusion or other emergent finding. No significant atheromatous disease for age. No hemodynamically significant or correctable stenosis. 2. 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 Electronically Signed   By: Jeannine Boga M.D.   On: 03/03/2022 22:00   CT HEAD CODE STROKE WO CONTRAST  Result Date: 03/03/2022 CLINICAL DATA:  Code stroke. Initial evaluation for neuro deficit, stroke suspected. EXAM: CT HEAD WITHOUT CONTRAST TECHNIQUE: Contiguous axial images were obtained from the base of the skull through the vertex without intravenous contrast. RADIATION DOSE REDUCTION: This exam was performed according to the departmental dose-optimization program which includes automated exposure control, adjustment of the mA and/or kV according to patient size and/or use of iterative  reconstruction technique. COMPARISON:  None Available. FINDINGS: Brain: Cerebral volume within normal limits. No acute intracranial hemorrhage. No acute large vessel territory infarct. No mass lesion, midline shift or mass effect. No hydrocephalus or extra-axial fluid collection. Vascular: No abnormal hyperdense vessel. Skull: Scalp soft tissues and calvarium within normal limits. Sinuses/Orbits: Globes orbital soft tissues within normal limits. Paranasal sinuses are largely clear. No mastoid effusion. Other: None. ASPECTS [facility] Stroke Program Early CT Score) - Ganglionic level infarction (caudate, lentiform nuclei, internal capsule, insula, M1-M3 cortex): 7 - Supraganglionic infarction (M4-M6 cortex): 3 Total score (0-10 with 10 being normal): 10 IMPRESSION: 1. No acute intracranial abnormality. 2. ASPECTS is 10. Critical Value/emergent results were called by telephone at the time of interpretation on 03/03/2022 at 9:01 pm to provider [facility] , who verbally acknowledged these results. Electronically Signed   By: Jeannine Boga M.D.   On: 03/03/2022 21:02        Scheduled Meds:  aspirin EC  81 mg Oral Daily   cyanocobalamin  250 mcg Oral Daily   gabapentin  300 mg Oral BID   pantoprazole  40 mg Oral Daily   rosuvastatin  20 mg Oral Daily   venlafaxine XR  75 mg Oral Q breakfast   Continuous Infusions:  sodium chloride Stopped (03/04/22 1156)     LOS: 0 days       Phillips Climes, MD [facility]   To contact the attending provider between 7A-7P or the covering provider during after hours 7P-7A, please log into the web site [email or web address] and access using universal [place] password for that web site. If you do not have the password, please call the hospital operator.  03/04/2022, 4:02 PM

## 2022-03-04 NOTE — Evaluation (Signed)
Occupational Therapy Evaluation Patient Details Name: [name] MRN: [id number] DOB: August 27, 1955 Today's Date: 03/04/2022   History of Present Illness 66 y.o. 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 Doran Durand, who initially presented to [facility] ED with complaints of right-sided facial numbness.  MRI: No acute finding.  Negative for infarct   Clinical Impression   Patient admitted for the diagnosis above.  PTA she lives alone, continues to drive an adapted [place], and needed no assist with ADL/iADL.  She performs squat pivot transfers to her scooter/toilet/shower chair at baseline, and then is able to care for herself at scooter level.  She is needing a little more assist here at the hospital, only because her home is setup to meet her needs, and hospital is not.  No OT needs exist in the acute setting, and patient is not interested in a PT eval.  Advised PT that patient is at her baseline.  No post acute OT is anticipated.        Recommendations for follow up therapy are one component of a multi-disciplinary discharge planning process, led by the attending physician.  Recommendations may be updated based on patient status, additional functional criteria and insurance authorization.   Follow Up Recommendations  No OT follow up    Assistance Recommended at Discharge None  Patient can return home with the following      Functional Status Assessment  Patient has not had a recent decline in their functional status  Equipment Recommendations  None recommended by OT    Recommendations for Other Services       Precautions / Restrictions Precautions Precautions: Fall Restrictions Weight Bearing Restrictions: No      Mobility Bed Mobility Overal  bed mobility: Modified Independent                  Transfers                          Balance   Sitting-balance support: Feet supported Sitting balance-Leahy Scale: Normal                                     ADL either performed or assessed with clinical judgement   ADL Overall ADL's : At baseline                                             Vision Baseline Vision/History: 1 Wears glasses Patient Visual Report: No change from baseline       Perception Perception Perception: Within Functional Limits   Praxis Praxis Praxis: Intact    Pertinent Vitals/Pain Pain Assessment Pain Assessment: No/denies pain     Hand Dominance Right   Extremity/Trunk Assessment Upper Extremity Assessment Upper Extremity Assessment: Overall WFL for tasks assessed   Lower Extremity Assessment Lower Extremity Assessment: RLE deficits/detail RLE Deficits / Details: post polio RLE Sensation: WNL RLE Coordination: decreased fine motor;decreased gross motor   Cervical / Trunk Assessment Cervical / Trunk Assessment: Normal   Communication Communication Communication: No difficulties   Cognition Arousal/Alertness: Awake/alert Behavior During Therapy: WFL for tasks  assessed/performed Overall Cognitive Status: Within Functional Limits for tasks assessed                                       General Comments   VSS on RA    Exercises     Shoulder Instructions      Home Living Family/patient expects to be discharged to:: Private residence Living Arrangements: Alone Available Help at Discharge: Family;Available PRN/intermittently Type of Home: House Home Access: Ramped entrance     Home Layout: One level     Bathroom Shower/Tub: Occupational psychologist: Handicapped height Bathroom Accessibility: Yes How Accessible: Accessible via wheelchair Home Equipment: Tub bench;BSC/3in1;Grab bars -  tub/shower;Hand held shower head;Electric scooter          Prior Functioning/Environment Prior Level of Function : Independent/Modified Independent;Driving                        OT Problem List: Impaired sensation      OT Treatment/Interventions:      OT Goals(Current goals can be found in the care plan section) Acute Rehab OT Goals Patient Stated Goal: Patient hoping to return home OT Goal Formulation: With patient Time For Goal Achievement: 03/12/22 Potential to Achieve Goals: Good  OT Frequency:      Co-evaluation              AM-PAC OT "6 Clicks" Daily Activity     Outcome Measure Help from another person eating meals?: None Help from another person taking care of personal grooming?: None Help from another person toileting, which includes using toliet, bedpan, or urinal?: None Help from another person bathing (including washing, rinsing, drying)?: None Help from another person to put on and taking off regular upper body clothing?: None Help from another person to put on and taking off regular lower body clothing?: None 6 Click Score: 24   End of Session Nurse Communication: Mobility status  Activity Tolerance: Patient tolerated treatment well Patient left: in bed;with call bell/phone within reach;with family/visitor present  OT Visit Diagnosis: Dizziness and giddiness (R42)                Time: 8099-8338 OT Time Calculation (min): 18 min Charges:  OT General Charges $OT Visit: 1 Visit OT Evaluation $OT Eval Moderate Complexity: 1 Mod  03/04/2022  RP, OTR/L  Acute Rehabilitation Services  Office:  [phone]   Metta Clines 03/04/2022, 11:56 AM

## 2022-03-05 DIAGNOSIS — G43109 Migraine with aura, not intractable, without status migrainosus: Secondary | ICD-10-CM

## 2022-03-05 DIAGNOSIS — Z952 Presence of prosthetic heart valve: Secondary | ICD-10-CM | POA: Diagnosis not present

## 2022-03-05 DIAGNOSIS — R079 Chest pain, unspecified: Secondary | ICD-10-CM

## 2022-03-05 DIAGNOSIS — R200 Anesthesia of skin: Secondary | ICD-10-CM | POA: Diagnosis not present

## 2022-03-05 LAB — CBC
HCT: 38.8 % (ref 36.0–46.0)
Hemoglobin: 13.4 g/dL (ref 12.0–15.0)
MCH: 31.8 pg (ref 26.0–34.0)
MCHC: 34.5 g/dL (ref 30.0–36.0)
MCV: 92.2 fL (ref 80.0–100.0)
Platelets: 268 10*3/uL (ref 150–400)
RBC: 4.21 MIL/uL (ref 3.87–5.11)
RDW: 13 % (ref 11.5–15.5)
WBC: 9.1 10*3/uL (ref 4.0–10.5)
nRBC: 0 % (ref 0.0–0.2)

## 2022-03-05 LAB — BASIC METABOLIC PANEL
Anion gap: 8 (ref 5–15)
BUN: 13 mg/dL (ref 8–23)
CO2: 24 mmol/L (ref 22–32)
Calcium: 8.7 mg/dL — ABNORMAL LOW (ref 8.9–10.3)
Chloride: 107 mmol/L (ref 98–111)
Creatinine, Ser: 0.75 mg/dL (ref 0.44–1.00)
GFR, Estimated: >60 mL/min (ref >60)
Glucose, Bld: 149 mg/dL — ABNORMAL HIGH (ref 70–99)
Potassium: 3.9 mmol/L (ref 3.5–5.1)
Sodium: 139 mmol/L (ref 135–145)

## 2022-03-05 NOTE — Discharge Summary (Signed)
Physician Discharge Summary  [name] UYQ:[id number] DOB: 02-21-1956 DOA: 03/03/2022  PCP: [name], MD  Admit date: 03/03/2022 Discharge date: 03/05/2022  Admitted From: Home Disposition:  Home   Recommendations for Outpatient Follow-up:  Follow up with PCP in 1-2 weeks Follow-up with neurology as an outpatient, ambulatory referral has been sent Outpatient follow-up with Dr. Norma Fredrickson IR for right MCA aneurysmal management  Home Health:NO   Discharge Condition: Stable CODE STATUS:FULL Diet recommendation: Heart Healthy   Brief/Interim Summary:  [name] is a 66 y.o. 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 Doran Durand, who initially presented to [facility] ED with complaints of right-sided facial numbness since 1430 on 03/03/2022.  Associated with headache and brain fog with onset the day before.  She was seen by teleneurology, who recommended admission for TIA/CVA work-up.   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  -Interventional neurology will arrange for outpatient follow-up     Hyperlipidemia Resume Crestor daily   Chronic anxiety/depression Resume home Effexor XL   Severe morbid obesity BMI 41 Recommend weight loss outpatient    OSA on CPAP Continue CPAP nightly  Discharge Diagnoses:  Principal Problem:   Right facial numbness Active Problems:   Obesity (BMI 35.0-39.9 without comorbidity)   Hyperlipidemia   GERD (gastroesophageal reflux disease)   Depression with anxiety   Severe aortic stenosis   OSA on CPAP   Chronic diastolic CHF (congestive heart failure) (HCC)   S/P TAVR (transcatheter aortic valve replacement)   Complicated migraine    Discharge Instructions  Discharge Instructions     Ambulatory referral to Neurology   Complete by: As directed    Follow up with stroke clinic NP [facility], if not available, consider Zachery Dauer, or [place]) at [facility] in about 4 weeks. Thanks.   Diet - low sodium heart healthy   Complete by: As directed    Discharge instructions   Complete by: As directed    Follow with Primary MD [name], MD in 7 days   Get CBC, CMP,  checked  by Primary MD next visit.    Activity: As tolerated with Full fall precautions use walker/cane & assistance as needed   Disposition Home    Diet: Heart Healthy   On your next visit with your primary care physician please Get Medicines reviewed and adjusted.   Please request your Prim.MD to go over all Hospital Tests  and Procedure/Radiological results at the follow up, please get all Hospital records sent to your Prim MD by signing hospital release before you go home.   If you experience worsening of your admission symptoms, develop shortness of breath, life threatening emergency, suicidal or homicidal  thoughts you must seek medical attention immediately by calling 911 or calling your MD immediately  if symptoms less severe.  You Must read complete instructions/literature along with all the possible adverse reactions/side effects for all the Medicines you take and that have been prescribed to you. Take any new Medicines after you have completely understood and accpet all the possible adverse reactions/side effects.   Do not drive, operating heavy machinery, perform activities at heights, swimming or participation in water activities or provide baby sitting services if your were admitted for syncope or siezures until you have seen by Primary MD or a Neurologist and advised to do so again.  Do not drive when taking Pain medications.    Do not take more than prescribed Pain, Sleep and Anxiety Medications  Special Instructions: If you have smoked or chewed Tobacco  in the last 2 yrs please stop smoking, stop any regular Alcohol  and or any Recreational drug use.  Wear Seat belts while driving.   Please note  You were cared for by a hospitalist during your hospital stay. If you have any questions about your discharge medications or the care you received while you were in the hospital after you are discharged, you can call the unit and asked to speak with the hospitalist on call if the hospitalist that took care of you is not available. Once you are discharged, your primary care physician will handle any further medical issues. Please note that NO REFILLS for any discharge medications will be authorized once you are discharged, as it is imperative that you return to your primary care physician (or establish a relationship with a primary care physician if you do not have one) for your aftercare needs so that they can reassess your need for medications and monitor your lab values.   Increase activity slowly   Complete by: As directed    No wound care   Complete by: As directed       Allergies as  of 03/05/2022       Reactions   Amoxicillin Anaphylaxis, Swelling   Did it involve swelling of the face/tongue/throat, SOB, or low BP? Yes Did it involve sudden or severe rash/hives, skin peeling, or any reaction on the inside of your mouth or nose? No Did you need to seek medical attention at a hospital or doctor's office? Yes When did it last happen?    yrs ago   If all above answers are "NO", may proceed with cephalosporin use.   Flagyl [metronidazole] Anaphylaxis   Plaquenil [hydroxychloroquine Sulfate] Other (See Comments)   Blurred vision, joint pain   Codeine Nausea Only   Doxycycline Nausea And Vomiting   Other Nausea Only   Tylenol with Codeine #3   Percocet [oxycodone-acetaminophen] Nausea Only   Sulfa Antibiotics Nausea Only        Medication List     TAKE these medications    acetaminophen 325 MG tablet Commonly known as: TYLENOL Take 650 mg by mouth every 6 (six) hours as needed for mild pain, moderate pain, fever or headache.   ALPRAZolam 0.5 MG tablet Commonly known as: XANAX Take 0.5 mg by mouth 2 (two) times daily as needed for sleep.   Aspirin 81 MG  Caps Take 1 mg by mouth 1 day or 1 dose.   cetirizine 10 MG tablet Commonly known as: ZYRTEC Take 10 mg by mouth as needed for allergies.   Cholecalciferol 50 MCG (2000 UT) Tabs Take 2,000 Units by mouth daily.   cyclobenzaprine 10 MG tablet Commonly known as: FLEXERIL Take 1 tablet (10 mg total) by mouth 2 (two) times daily as needed for muscle spasms.   docusate sodium 100 MG capsule Commonly known as: Colace Take 1 capsule (100 mg total) by mouth 2 (two) times daily. While taking narcotic pain medicine.   Eysuvis 0.25 % Susp Generic drug: Loteprednol Etabonate Place 1 drop into both eyes 3 (three) times a week.   fluticasone 50 MCG/ACT nasal spray Commonly known as: FLONASE Place 1 spray into both nostrils as needed for allergies or rhinitis.   gabapentin 300 MG capsule Commonly known as:  NEURONTIN Take 300 mg by mouth daily.   lidocaine 5 % Commonly known as: Lidoderm Place 1 patch onto the skin daily. Remove & Discard patch within 12 hours or as directed by MD   ondansetron 4 MG tablet Commonly known as: Zofran Take 1 tablet (4 mg total) by mouth every 8 (eight) hours as needed for nausea or vomiting.   pantoprazole 40 MG tablet Commonly known as: PROTONIX Take 40 mg by mouth daily.   rosuvastatin 20 MG tablet Commonly known as: CRESTOR Take 1 tablet (20 mg total) by mouth daily. What changed: when to take this   senna 8.6 MG Tabs tablet Commonly known as: SENOKOT Take 2 tablets (17.2 mg total) by mouth 2 (two) times daily.   Systane Balance 0.6 % Soln Generic drug: Propylene Glycol Place 1 drop into both eyes in the morning, at noon, in the evening, and at bedtime.   SYSTANE OP Place 1-2 drops into both eyes as needed (dry eyes).   traZODone 150 MG tablet Commonly known as: DESYREL Take 150 mg by mouth at bedtime.   venlafaxine XR 75 MG 24 hr capsule Commonly known as: EFFEXOR-XR Take 75 mg by mouth daily with breakfast.   vitamin B-12 250 MCG tablet Commonly known as: CYANOCOBALAMIN Take 250 mcg by mouth daily.        Follow-up Information     de Rosario Jacks, MD Follow up.   Specialties: Radiology, Interventional Radiology Why: You will be called with an appointment Contact information: [address] [address] [facility]. Schedule an appointment as soon as possible for a visit in 1 month(s).   Specialty: Neurology Contact information: [address] [place] [phone]        [name], MD. Schedule an appointment as soon as possible for a visit.   Specialty: Family Medicine Why: 1-2 weeks to follow up with PCP Contact information: [address] [place]  [phone]                Allergies  Allergen Reactions   Amoxicillin Anaphylaxis and Swelling    Did it involve swelling of the face/tongue/throat, SOB, or low BP? Yes Did it involve sudden or severe rash/hives, skin peeling, or any reaction on the inside of your mouth or nose? No Did you need to seek medical attention at a hospital or doctor's office? Yes When did it last happen?    yrs ago   If all above answers  are "NO", may proceed with cephalosporin use.    Flagyl [Metronidazole] Anaphylaxis   Plaquenil [Hydroxychloroquine Sulfate] Other (See Comments)    Blurred vision, joint pain   Codeine Nausea Only   Doxycycline Nausea And Vomiting   Other Nausea Only    Tylenol with Codeine #3   Percocet [Oxycodone-Acetaminophen] Nausea Only   Sulfa Antibiotics Nausea Only    Consultations: Cardiology Neurology   Procedures/Studies: ECHOCARDIOGRAM COMPLETE BUBBLE STUDY  Result Date: 03/04/2022    ECHOCARDIOGRAM REPORT   Patient Name:   MAANASA ADERHOLD Date of Exam: 03/04/2022 Medical Rec #:  [id number]         Height:       68.0 in Accession #:    [id number]        Weight:       274.7 lb Date of Birth:  07-20-1955         BSA:          2.339 m Patient Age:    34 years          BP:           137/82 mmHg Patient Gender: F                 HR:           73 bpm. Exam Location:  Inpatient Procedure: 2D Echo, Cardiac Doppler, Color Doppler and Saline Contrast Bubble            Study Indications:    Stroke  History:        Patient has prior history of Echocardiogram examinations, most                 recent 01/05/2022. CAD; Risk Factors:Dyslipidemia.                 Aortic Valve: 26 mm Sapien prosthetic, stented (TAVR) valve is                 present in the aortic position. Procedure Date: 04/17/2019.  Sonographer:    Jefferey Pica Referring Phys: [id number] CAROLE N HALL IMPRESSIONS  1. 26 mm S3. Vmax 2.6 m/s, MG 14.5 mmHG, EOA 1.59 cm2, DI 0.46. Mean gradients has increased from 9  mmHG to 14.5 mmHG. Would recommend TEE or gated cardiac CTA to exclude leaflet thrombosis. The aortic valve has been repaired/replaced. Aortic valve regurgitation is not visualized. There is a 26 mm Sapien prosthetic (TAVR) valve present in the aortic position. Procedure Date: 04/17/2019.  2. Left ventricular ejection fraction, by estimation, is 65 to 70%. The left ventricle has normal function. The left ventricle has no regional wall motion abnormalities. There is moderate concentric left ventricular hypertrophy. Left ventricular diastolic parameters are consistent with Grade I diastolic dysfunction (impaired relaxation).  3. Right ventricular systolic function is normal. The right ventricular size is normal. Tricuspid regurgitation signal is inadequate for assessing PA pressure.  4. The mitral valve is grossly normal. No evidence of mitral valve regurgitation. No evidence of mitral stenosis.  5. The inferior vena cava is dilated in size with >50% respiratory variability, suggesting right atrial pressure of 8 mmHg.  6. Agitated saline contrast bubble study was negative, with no evidence of any interatrial shunt. FINDINGS  Left Ventricle: Left ventricular ejection fraction, by estimation, is 65 to 70%. The left ventricle has normal function. The left ventricle has no regional wall motion abnormalities. The left ventricular internal cavity size was normal in size. There is  moderate concentric left  ventricular hypertrophy. Left ventricular diastolic parameters are consistent with Grade I diastolic dysfunction (impaired relaxation). Right Ventricle: The right ventricular size is normal. No increase in right ventricular wall thickness. Right ventricular systolic function is normal. Tricuspid regurgitation signal is inadequate for assessing PA pressure. Left Atrium: Left atrial size was normal in size. Right Atrium: Right atrial size was normal in size. Pericardium: There is no evidence of pericardial effusion. Presence  of epicardial fat layer. Mitral Valve: The mitral valve is grossly normal. No evidence of mitral valve regurgitation. No evidence of mitral valve stenosis. Tricuspid Valve: The tricuspid valve is grossly normal. Tricuspid valve regurgitation is trivial. No evidence of tricuspid stenosis. Aortic Valve: 26 mm S3. Vmax 2.6 m/s, MG 14.5 mmHG, EOA 1.59 cm2, DI 0.46. Mean gradients has increased from 9 mmHG to 14.5 mmHG. Would recommend TEE or gated cardiac CTA to exclude leaflet thrombosis. The aortic valve has been repaired/replaced. Aortic valve regurgitation is not visualized. Aortic valve mean gradient measures 14.5 mmHg. Aortic valve peak gradient measures 27.1 mmHg. Aortic valve area, by VTI measures 1.59 cm. There is a 26 mm Sapien prosthetic, stented (TAVR) valve present in the aortic position. Procedure Date: 04/17/2019. Pulmonic Valve: The pulmonic valve was grossly normal. Pulmonic valve regurgitation is not visualized. No evidence of pulmonic stenosis. Aorta: The aortic root and ascending aorta are structurally normal, with no evidence of dilitation. Venous: The inferior vena cava is dilated in size with greater than 50% respiratory variability, suggesting right atrial pressure of 8 mmHg. IAS/Shunts: The atrial septum is grossly normal. Agitated saline contrast was given intravenously to evaluate for intracardiac shunting. Agitated saline contrast bubble study was negative, with no evidence of any interatrial shunt.  LEFT VENTRICLE PLAX 2D LVIDd:         4.80 cm   Diastology LVIDs:         2.60 cm   LV e' medial:    5.68 cm/s LV PW:         1.40 cm   LV E/e' medial:  8.6 LV IVS:        1.30 cm   LV e' lateral:   5.35 cm/s LVOT diam:     2.10 cm   LV E/e' lateral: 9.2 LV SV:         92 LV SV Index:   39 LVOT Area:     3.46 cm  RIGHT VENTRICLE             IVC RV Basal diam:  2.80 cm     IVC diam: 2.20 cm RV S prime:     12.80 cm/s TAPSE (M-mode): 2.4 cm LEFT ATRIUM             Index        RIGHT ATRIUM            Index LA diam:        4.40 cm 1.88 cm/m   RA Area:     13.75 cm LA Vol (A2C):   36.4 ml 15.56 ml/m  RA Volume:   32.00 ml  13.68 ml/m LA Vol (A4C):   50.8 ml 21.72 ml/m LA Biplane Vol: 46.5 ml 19.88 ml/m  AORTIC VALVE                     PULMONIC VALVE AV Area (Vmax):    1.62 cm      PV Vmax:       0.91 m/s AV Area (Vmean):  1.39 cm      PV Peak grad:  3.3 mmHg AV Area (VTI):     1.59 cm AV Vmax:           260.50 cm/s AV Vmean:          177.500 cm/s AV VTI:            0.576 m AV Peak Grad:      27.1 mmHg AV Mean Grad:      14.5 mmHg LVOT Vmax:         122.00 cm/s LVOT Vmean:        71.400 cm/s LVOT VTI:          0.265 m LVOT/AV VTI ratio: 0.46  AORTA Ao Asc diam: 3.10 cm MITRAL VALVE MV Area (PHT): 4.19 cm    SHUNTS MV Decel Time: 181 msec    Systemic VTI:  0.26 m MV E velocity: 49.10 cm/s  Systemic Diam: 2.10 cm MV A velocity: 80.00 cm/s MV E/A ratio:  0.61 Eleonore Chiquito MD Electronically signed by Eleonore Chiquito MD Signature Date/Time: 03/04/2022/12:56:59 PM    Final    MR BRAIN WO CONTRAST  Result Date: 03/04/2022 CLINICAL DATA:  Neuro deficit with acute stroke suspected. EXAM: MRI HEAD WITHOUT CONTRAST TECHNIQUE: Multiplanar, multiecho pulse sequences of the brain and surrounding structures were obtained without intravenous contrast. COMPARISON:  CT and CTA from yesterday FINDINGS: Brain: No acute infarction, hemorrhage, hydrocephalus, extra-axial collection or mass lesion. Vascular: Preserved flow voids. Bulbous appearance at the right MCA bifurcation, known aneurysm from prior CTA. Skull and upper cervical spine: Cervical facet spurring with C3-4 anterolisthesis. No focal marrow lesion. Sinuses/Orbits: Bilateral cataract resection. Generalized mild mucosal thickening in the paranasal sinuses IMPRESSION: 1. No acute finding.  Negative for infarct. 2. Right MCA aneurysm, see prior CTA. Electronically Signed   By: Jorje Guild M.D.   On: 03/04/2022 05:11   CT ANGIO HEAD NECK W WO CM  Result  Date: 03/03/2022 CLINICAL DATA:  Initial evaluation for headache, brain fog. Facial numbness. EXAM: CT ANGIOGRAPHY HEAD AND NECK TECHNIQUE: Multidetector CT imaging of the head and neck was performed using the standard protocol during bolus administration of intravenous contrast. Multiplanar CT image reconstructions and MIPs were obtained to evaluate the vascular anatomy. Carotid stenosis measurements (when applicable) are obtained utilizing NASCET criteria, using the distal internal carotid diameter as the denominator. RADIATION DOSE REDUCTION: This exam was performed according to the departmental dose-optimization program which includes automated exposure control, adjustment of the mA and/or kV according to patient size and/or use of iterative reconstruction technique. CONTRAST:  44m OMNIPAQUE IOHEXOL 350 MG/ML SOLN COMPARISON:  CT from earlier the same day. FINDINGS: CTA NECK FINDINGS Aortic arch: Visualized aortic arch normal in caliber. Bovine branching pattern noted. No stenosis about the origin of the great vessels. Right carotid system: Right common and internal carotid arteries are patent without stenosis, dissection or occlusion. Left carotid system: Left common and internal carotid arteries are patent without stenosis, dissection or occlusion. Vertebral arteries: Both vertebral arteries arise from subclavian arteries. No proximal subclavian artery stenosis. Vertebral arteries patent without stenosis, dissection or occlusion. Skeleton: No discrete or worrisome osseous lesions. Moderate spondylosis present at C4-5 through C6-7. Degenerative changes noted about the right TMJ. Other neck: No other acute soft tissue abnormality within the neck. Upper chest: Visualized upper chest demonstrates no acute finding. Review of the MIP images confirms the above findings CTA HEAD FINDINGS Anterior circulation: Both internal carotid arteries patent to the  termini without stenosis. A1 segments patent bilaterally.  Negative anterior communicating artery complex. Both ACAs patent to their distal aspects without stenosis. No M1 stenosis or occlusion. 5-6 mm saccular aneurysm seen arising from the right MCA bifurcation (series 8, image 103). No proximal MCA branch occlusion. Distal MCA branches perfused and symmetric. Posterior circulation: Both V4 segments patent without stenosis. Both PICA patent. Basilar patent to its distal aspect without stenosis. Superior cerebral arteries patent bilaterally. Both PCAs primarily supplied via the basilar well perfused or distal aspects. Venous sinuses: Patent allowing for timing the contrast bolus. DVA noted at the right cerebellum. Anatomic variants: None significant. Review of the MIP images confirms the above findings IMPRESSION: 1. Negative CTA of the head and neck for large vessel occlusion or other emergent finding. No significant atheromatous disease for age. No hemodynamically significant or correctable stenosis. 2. 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 Electronically Signed   By: Jeannine Boga M.D.   On: 03/03/2022 22:00   CT HEAD CODE STROKE WO CONTRAST  Result Date: 03/03/2022 CLINICAL DATA:  Code stroke. Initial evaluation for neuro deficit, stroke suspected. EXAM: CT HEAD WITHOUT CONTRAST TECHNIQUE: Contiguous axial images were obtained from the base of the skull through the vertex without intravenous contrast. RADIATION DOSE REDUCTION: This exam was performed according to the departmental dose-optimization program which includes automated exposure control, adjustment of the mA and/or kV according to patient size and/or use of iterative reconstruction technique. COMPARISON:  None Available. FINDINGS: Brain: Cerebral volume within normal limits. No acute intracranial hemorrhage. No acute large vessel territory infarct. No mass lesion, midline shift or mass effect. No hydrocephalus or extra-axial fluid collection. Vascular: No abnormal  hyperdense vessel. Skull: Scalp soft tissues and calvarium within normal limits. Sinuses/Orbits: Globes orbital soft tissues within normal limits. Paranasal sinuses are largely clear. No mastoid effusion. Other: None. ASPECTS [facility] Stroke Program Early CT Score) - Ganglionic level infarction (caudate, lentiform nuclei, internal capsule, insula, M1-M3 cortex): 7 - Supraganglionic infarction (M4-M6 cortex): 3 Total score (0-10 with 10 being normal): 10 IMPRESSION: 1. No acute intracranial abnormality. 2. ASPECTS is 10. Critical Value/emergent results were called by telephone at the time of interpretation on 03/03/2022 at 9:01 pm to provider [facility] , who verbally acknowledged these results. Electronically Signed   By: Jeannine Boga M.D.   On: 03/03/2022 21:02      Subjective:  No significant events overnight, she denies any complaints.  Discharge Exam: Vitals:   03/04/22 2317 03/05/22 0400  BP: (!) 110/55 (!) 102/58  Pulse: 76 78  Resp: 19 17  Temp: 98.5 F (36.9 C) 98.3 F (36.8 C)  SpO2: 91% 91%   Vitals:   03/04/22 1620 03/04/22 2026 03/04/22 2317 03/05/22 0400  BP: (!) 109/55 (!) 144/78 (!) 110/55 (!) 102/58  Pulse: 75 80 76 78  Resp: 14 (!) _0 Temp: 97.7 F (36.5 C) 97.6 F (36.4 C) 98.5 F (36.9 C) 98.3 F (36.8 C)  TempSrc: Oral Oral Oral Oral  SpO2: 95% 96% 91% 91%  Weight:      Height:        General: Pt is alert, awake, not in acute distress Cardiovascular: RRR, S1/S2 +, no rubs, no gallops Respiratory: CTA bilaterally, no wheezing, no rhonchi Abdominal: Soft, NT, ND, bowel sounds + Extremities: no edema, no cyanosis, chronic lower extremity weakness due to polio    The results of significant diagnostics from this hospitalization (including imaging, microbiology, ancillary and laboratory) are  listed below for reference.     Microbiology: Recent Results (from the past 240 hour(s))  Resp Panel by RT-PCR (Flu A&B, Covid) Anterior Nasal Swab      Status: None   Collection Time: 03/03/22  8:44 PM   Specimen: Anterior Nasal Swab  Result Value Ref Range Status   SARS Coronavirus 2 by RT PCR NEGATIVE NEGATIVE Final    Comment: (NOTE) SARS-CoV-2 target nucleic acids are NOT DETECTED.  The SARS-CoV-2 RNA is generally detectable in upper respiratory specimens during the acute phase of infection. The lowest concentration of SARS-CoV-2 viral copies this assay can detect is 138 copies/mL. A negative result does not preclude SARS-Cov-2 infection and should not be used as the sole basis for treatment or other patient management decisions. A negative result may occur with  improper specimen collection/handling, submission of specimen other than nasopharyngeal swab, presence of viral mutation(s) within the areas targeted by this assay, and inadequate number of viral copies(<138 copies/mL). A negative result must be combined with clinical observations, patient history, and epidemiological information. The expected result is Negative.  Fact Sheet for Patients:  EntrepreneurPulse.com.au  Fact Sheet for Healthcare Providers:  IncredibleEmployment.be  This test is no t yet approved or cleared by the [place] FDA and  has been authorized for detection and/or diagnosis of SARS-CoV-2 by FDA under an Emergency Use Authorization (EUA). This EUA will remain  in effect (meaning this test can be used) for the duration of the COVID-19 declaration under Section 564(b)(1) of the Act, 21 U.S.C.section 360bbb-3(b)(1), unless the authorization is terminated  or revoked sooner.       Influenza A by PCR NEGATIVE NEGATIVE Final   Influenza B by PCR NEGATIVE NEGATIVE Final    Comment: (NOTE) The Xpert Xpress SARS-CoV-2/FLU/RSV plus assay is intended as an aid in the diagnosis of influenza from Nasopharyngeal swab specimens and should not be used as a sole basis for treatment. Nasal washings and aspirates are  unacceptable for Xpert Xpress SARS-CoV-2/FLU/RSV testing.  Fact Sheet for Patients: EntrepreneurPulse.com.au  Fact Sheet for Healthcare Providers: IncredibleEmployment.be  This test is not yet approved or cleared by the [place] FDA and has been authorized for detection and/or diagnosis of SARS-CoV-2 by FDA under an Emergency Use Authorization (EUA). This EUA will remain in effect (meaning this test can be used) for the duration of the COVID-19 declaration under Section 564(b)(1) of the Act, 21 U.S.C. section 360bbb-3(b)(1), unless the authorization is terminated or revoked.  Performed at [facility], [address], [place], [place] [address]      Labs: BNP (last 3 results) No results for input(s): "BNP" in the last 8760 hours. Basic Metabolic Panel: Recent Labs  Lab 03/03/22 2115 03/05/22 0213  NA 137 139  K 4.3 3.9  CL 103 107  CO2 25 24  GLUCOSE 122* 149*  BUN 10 13  CREATININE 0.74 0.75  CALCIUM 8.8* 8.7*   Liver Function Tests: Recent Labs  Lab 03/03/22 2115  AST 20  ALT 12  ALKPHOS 88  BILITOT 0.3  PROT 6.7  ALBUMIN 3.7   No results for input(s): "LIPASE", "AMYLASE" in the last 168 hours. No results for input(s): "AMMONIA" in the last 168 hours. CBC: Recent Labs  Lab 03/03/22 2115 03/05/22 0213  WBC 9.3 9.1  NEUTROABS 4.4  --   HGB 13.5 13.4  HCT 39.4 38.8  MCV 92.3 92.2  PLT 265 268   Cardiac Enzymes: No results for input(s): "CKTOTAL", "CKMB", "CKMBINDEX", "TROPONINI" in  the last 168 hours. BNP: Invalid input(s): "POCBNP" CBG: Recent Labs  Lab 03/03/22 2109  GLUCAP 129*   D-Dimer No results for input(s): "DDIMER" in the last 72 hours. Hgb A1c Recent Labs    03/04/22 0326  HGBA1C 6.2*   Lipid Profile Recent Labs    03/04/22 0326  CHOL 127  HDL 39*  LDLCALC 44  TRIG 222*  CHOLHDL 3.3   Thyroid function studies No results for input(s): "TSH", "T4TOTAL",  "T3FREE", "THYROIDAB" in the last 72 hours.  Invalid input(s): "FREET3" Anemia work up No results for input(s): "VITAMINB12", "FOLATE", "FERRITIN", "TIBC", "IRON", "RETICCTPCT" in the last 72 hours. Urinalysis    Component Value Date/Time   COLORURINE YELLOW 03/03/2022 2044   APPEARANCEUR CLEAR 03/03/2022 2044   LABSPEC 1.028 03/03/2022 2044   PHURINE 5.5 03/03/2022 2044   GLUCOSEU NEGATIVE 03/03/2022 2044   HGBUR NEGATIVE 03/03/2022 2044   BILIRUBINUR NEGATIVE 03/03/2022 2044   KETONESUR NEGATIVE 03/03/2022 2044   PROTEINUR NEGATIVE 03/03/2022 2044   UROBILINOGEN 0.2 06/03/2010 0623   NITRITE NEGATIVE 03/03/2022 2044   LEUKOCYTESUR NEGATIVE 03/03/2022 2044   Sepsis Labs Recent Labs  Lab 03/03/22 2115 03/05/22 0213  WBC 9.3 9.1   Microbiology Recent Results (from the past 240 hour(s))  Resp Panel by RT-PCR (Flu A&B, Covid) Anterior Nasal Swab     Status: None   Collection Time: 03/03/22  8:44 PM   Specimen: Anterior Nasal Swab  Result Value Ref Range Status   SARS Coronavirus 2 by RT PCR NEGATIVE NEGATIVE Final    Comment: (NOTE) SARS-CoV-2 target nucleic acids are NOT DETECTED.  The SARS-CoV-2 RNA is generally detectable in upper respiratory specimens during the acute phase of infection. The lowest concentration of SARS-CoV-2 viral copies this assay can detect is 138 copies/mL. A negative result does not preclude SARS-Cov-2 infection and should not be used as the sole basis for treatment or other patient management decisions. A negative result may occur with  improper specimen collection/handling, submission of specimen other than nasopharyngeal swab, presence of viral mutation(s) within the areas targeted by this assay, and inadequate number of viral copies(<138 copies/mL). A negative result must be combined with clinical observations, patient history, and epidemiological information. The expected result is Negative.  Fact Sheet for Patients:   EntrepreneurPulse.com.au  Fact Sheet for Healthcare Providers:  IncredibleEmployment.be  This test is no t yet approved or cleared by the [place] FDA and  has been authorized for detection and/or diagnosis of SARS-CoV-2 by FDA under an Emergency Use Authorization (EUA). This EUA will remain  in effect (meaning this test can be used) for the duration of the COVID-19 declaration under Section 564(b)(1) of the Act, 21 U.S.C.section 360bbb-3(b)(1), unless the authorization is terminated  or revoked sooner.       Influenza A by PCR NEGATIVE NEGATIVE Final   Influenza B by PCR NEGATIVE NEGATIVE Final    Comment: (NOTE) The Xpert Xpress SARS-CoV-2/FLU/RSV plus assay is intended as an aid in the diagnosis of influenza from Nasopharyngeal swab specimens and should not be used as a sole basis for treatment. Nasal washings and aspirates are unacceptable for Xpert Xpress SARS-CoV-2/FLU/RSV testing.  Fact Sheet for Patients: EntrepreneurPulse.com.au  Fact Sheet for Healthcare Providers: IncredibleEmployment.be  This test is not yet approved or cleared by the [place] FDA and has been authorized for detection and/or diagnosis of SARS-CoV-2 by FDA under an Emergency Use Authorization (EUA). This EUA will remain in effect (meaning this test can be used) for the  duration of the COVID-19 declaration under Section 564(b)(1) of the Act, 21 U.S.C. section 360bbb-3(b)(1), unless the authorization is terminated or revoked.  Performed at [facility], [address], [place], [place] [address]      Time coordinating discharge: Over 30 minutes  SIGNED:   Phillips Climes, MD  [facility] 03/05/2022, 10:31 AM Pager   If 7PM-7AM, please contact night-coverage [email or web address]

## 2022-03-09 ENCOUNTER — Other Ambulatory Visit (HOSPITAL_COMMUNITY): Payer: Self-pay | Admitting: Neuroradiology

## 2022-03-09 DIAGNOSIS — I671 Cerebral aneurysm, nonruptured: Secondary | ICD-10-CM

## 2022-03-10 ENCOUNTER — Ambulatory Visit (HOSPITAL_COMMUNITY)
Admission: RE | Admit: 2022-03-10 | Discharge: 2022-03-10 | Disposition: A | Discharge status: Home / Self Care | Payer: Medicare HMO | Source: Ambulatory Visit | Attending: Neuroradiology | Admitting: Neuroradiology

## 2022-03-10 ENCOUNTER — Other Ambulatory Visit (HOSPITAL_COMMUNITY): Payer: Self-pay | Admitting: Neuroradiology

## 2022-03-10 ENCOUNTER — Telehealth (HOSPITAL_COMMUNITY): Payer: Self-pay

## 2022-03-10 DIAGNOSIS — I671 Cerebral aneurysm, nonruptured: Secondary | ICD-10-CM

## 2022-03-10 NOTE — Consult Note (Addendum)
Chief Complaint: Patient was seen in consultation today for brain aneurysm.  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  She refers occasional right temporal pain and currently complains of ears popping, early morning mild headache and says head "feels funny" since headache episode that brought her to the emergency.  She denies family history of brain aneurysm and stroke.  She does not smoke and has never smoked.   I personally reviewed her CT angiogram performed on 03/03/2022.  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  It is unclear whether the daughter artery originates from the neck of the aneurysm or how wide the neck of the aneurysm is.  No other aneurysm identified on the CT angiogram.   Past Medical History:  Diagnosis Date   Allergy    seasonal allergies   Anxiety    on meds   Arthritis    low back issues   Carpal tunnel syndrome    LEFT   Cataract    bilateral -sx   Cervical pain (neck)    Complication of anesthesia    DIFFICULTY WAKING UP   Coronary artery disease    Depression    on meds   GERD (gastroesophageal reflux disease)    on meds   Hyperlipidemia    diet controlled- not on meds at this time (08/05/2020)   Insomnia    Mass of right lower leg    Morbid obesity (HCC)    OSA on CPAP    uses CPAP nightly   Osteopenia    neck   PONV (postoperative nausea  and vomiting)    Post-polio syndrome    With left leg paralysis   S/P TAVR (transcatheter aortic valve replacement)    Sigmoid diverticulosis    Ulnar nerve abnormality     Past Surgical History:  Procedure Laterality Date   AMPUTATION TOE Left 02/18/2022   Procedure: Second and Third distal interphalangeal and Fourth proximal interphalangeal  toe amputations;  Surgeon: Wylene Simmer, MD;  Location: [place];  Service: Orthopedics;  Laterality: Left;   ANKLE FUSION     left   APPENDECTOMY     BIOPSY  05/04/2021   Procedure: BIOPSY;  Surgeon: Rush Landmark Telford Nab., MD;  Location: [facility];  Service: Gastroenterology;;   CHOLECYSTECTOMY     COLONOSCOPY     COLONOSCOPY N/A 05/04/2021   Procedure: COLONOSCOPY;  Surgeon: Irving Copas., MD;  Location: [facility];  Service: Gastroenterology;  Laterality: N/A;   ESOPHAGOGASTRODUODENOSCOPY (EGD) WITH PROPOFOL N/A 05/04/2021   Procedure: ESOPHAGOGASTRODUODENOSCOPY (EGD) WITH PROPOFOL;  Surgeon: Rush Landmark Telford Nab., MD;  Location: [facility];  Service: Gastroenterology;  Laterality: N/A;   EYE SURGERY Bilateral 2020   cataracts removed August and September 2020   JOINT REPLACEMENT Right 2003   three replacements; 2003 is last one   KNEE ARTHROPLASTY     LEG SURGERY     muscle surgery related to  polio   MASS EXCISION Right 01/08/2020   Procedure: EXCISION RIGHT LEG MASS;  Surgeon: Erroll Luna, MD;  Location: [place];  Service: General;  Laterality: Right;   POLYPECTOMY  05/04/2021   Procedure: POLYPECTOMY;  Surgeon: Rush Landmark Telford Nab., MD;  Location: [facility];  Service: Gastroenterology;;   RECTAL PROLAPSE REPAIR  2020   pessary placed   RIGHT/LEFT HEART CATH AND CORONARY ANGIOGRAPHY N/A 04/02/2019   Procedure: RIGHT/LEFT HEART CATH AND CORONARY ANGIOGRAPHY;  Surgeon: Nelva Bush, MD;  Location: [place] [facility];  Service: Cardiovascular;  Laterality: N/A;   SAVORY  DILATION N/A 05/04/2021   Procedure: SAVORY DILATION;  Surgeon: Rush Landmark Telford Nab., MD;  Location: [facility];  Service: Gastroenterology;  Laterality: N/A;   TEE WITHOUT CARDIOVERSION N/A 04/17/2019   Procedure: TRANSESOPHAGEAL ECHOCARDIOGRAM (TEE);  Surgeon: Sherren Mocha, MD;  Location: [place] [facility];  Service: Open Heart Surgery;  Laterality: N/A;   TOTAL HIP ARTHROPLASTY     right x 3   TRANSCATHETER AORTIC VALVE REPLACEMENT, TRANSFEMORAL N/A 04/17/2019   Procedure: TRANSCATHETER AORTIC VALVE REPLACEMENT, TRANSFEMORAL;  Surgeon: Sherren Mocha, MD;  Location: [place] [facility];  Service: Open Heart Surgery;  Laterality: N/A;   UMBILICAL HERNIA REPAIR      Allergies: Amoxicillin, Flagyl [metronidazole], Plaquenil [hydroxychloroquine sulfate], Codeine, Doxycycline, Other, Percocet [oxycodone-acetaminophen], and Sulfa antibiotics  Medications: Prior to Admission medications   Medication Sig Start Date End Date Taking? Authorizing Provider  acetaminophen (TYLENOL) 325 MG tablet Take 650 mg by mouth every 6 (six) hours as needed for mild pain, moderate pain, fever or headache.    [provider]  ALPRAZolam Duanne Moron) 0.5 MG tablet Take 0.5 mg by mouth 2 (two) times daily as needed for sleep.     [provider]  Aspirin 81 MG CAPS Take 1 mg by mouth 1 day or 1 dose.    [provider]  cetirizine (ZYRTEC) 10 MG tablet Take 10 mg by mouth as needed for allergies.    [provider]  Cholecalciferol 50 MCG (2000 UT) TABS Take 2,000 Units by mouth daily.     [provider]  cyclobenzaprine (FLEXERIL) 10 MG tablet Take 1 tablet (10 mg total) by mouth 2 (two) times daily as needed for muscle spasms. Patient not taking: Reported on 03/04/2022 01/10/22   Dorie Rank, MD  docusate sodium (COLACE) 100 MG capsule Take 1 capsule (100 mg total) by mouth 2 (two) times daily. While taking narcotic pain medicine. 02/18/22   Corky Sing, PA-C   fluticasone [facility]) 50 MCG/ACT nasal spray Place 1 spray into both nostrils as needed for allergies or rhinitis.    [provider]  gabapentin (NEURONTIN) 300 MG capsule Take 300 mg by mouth daily.    [provider]  lidocaine (LIDODERM) 5 % Place 1 patch onto the skin daily. Remove & Discard patch within 12 hours or as directed by MD Patient not taking: Reported on 03/04/2022 01/10/22   Dorie Rank, MD  Loteprednol Etabonate (EYSUVIS) 0.25 % SUSP Place 1 drop into both eyes 3 (three) times a week. Patient not taking: Reported on 03/04/2022    [provider]  ondansetron (ZOFRAN) 4 MG tablet Take 1 tablet (4 mg total) by mouth every 8 (eight) hours as needed for nausea or vomiting. Patient not taking: Reported on 03/04/2022 02/18/22   Corky Sing, PA-C  pantoprazole (PROTONIX) 40 MG tablet Take 40 mg by mouth daily.    [provider]  Polyethyl Glycol-Propyl Glycol (SYSTANE OP) Place 1-2 drops into both eyes as needed (dry eyes).    [provider]  Propylene Glycol (SYSTANE BALANCE) 0.6 % SOLN Place 1 drop into both eyes in the morning, at noon, in the evening, and at bedtime. Patient not taking: Reported on 03/04/2022    [provider]  rosuvastatin (CRESTOR) 20 MG tablet Take 1 tablet (20 mg total) by mouth daily. 03/04/22   Elgergawy, Silver Huguenin, MD  senna (SENOKOT) 8.6 MG TABS tablet Take 2 tablets (17.2 mg total) by mouth 2 (two) times daily. Patient not taking: Reported on 03/04/2022 02/18/22   Corky Sing, PA-C  traZODone (DESYREL) 150 MG tablet Take 150 mg by mouth at bedtime.    [provider]  venlafaxine XR (EFFEXOR-XR) 75 MG 24 hr capsule Take 75 mg by mouth daily with breakfast.    [provider]  vitamin B-12 (CYANOCOBALAMIN) 250 MCG tablet Take 250 mcg by mouth daily. Patient not taking: Reported on 03/04/2022    [provider]     Family History  Problem Relation Age of Onset   Ovarian  cancer Mother 31   Colon cancer Mother 82       mets from ovarian CA   Heart disease Father    CAD Other        FAM Hx OF CAD   Diabetes Other        FAM Hx of DM   Hypertension Other    Esophageal cancer Neg Hx    Rectal cancer Neg Hx    Stomach cancer Neg Hx    Inflammatory bowel disease Neg Hx    Liver disease Neg Hx    Pancreatic cancer Neg Hx     Social History   Socioeconomic History   Marital status: Single    Spouse name: Not on file   Number of children: Not on file   Years of education: Not on file   Highest education level: Not on file  Occupational History   Not on file  Tobacco Use   Smoking status: Never   Smokeless tobacco: Never  Vaping Use   Vaping Use: Never used  Substance and Sexual Activity   Alcohol use: No   Drug use: Never   Sexual activity: Not on file  Other Topics Concern   Not on file  Social History Narrative   Lives alone.   Social Determinants of Health   Financial Resource Strain: Not on file  Food Insecurity: Not on file  Transportation Needs: Not on file  Physical Activity: Not on file  Stress: Not on file  Social Connections: Not on file     Review of Systems: A 12 point ROS discussed and pertinent positives are indicated in the HPI above.  All other systems are negative.  Vital Signs: There were no vitals taken for this visit.  Physical Exam Constitutional:      Appearance: She is obese.  HENT:     Head: Normocephalic and atraumatic.     Mouth/Throat:     Mouth: Mucous membranes are moist.     Pharynx: Oropharynx is clear.  Eyes:     Extraocular Movements: Extraocular movements intact.     Pupils: Pupils are equal, round, and reactive to light.  Neurological:     Mental Status: She is alert and oriented to person, place, and time.     Cranial Nerves: Cranial nerves 2-12 are intact.     Motor: Weakness present.  Comments: Moderate right lower extremity weakness related to multiple hip replacement surgeries.  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          Imaging: ECHOCARDIOGRAM COMPLETE BUBBLE STUDY  Result Date: 03/04/2022    ECHOCARDIOGRAM REPORT   Patient Name:   [name] Date of Exam: 03/04/2022 Medical Rec #:  [id number]         Height:       68.0 in Accession #:    [id number]        Weight:       274.7 lb Date of Birth:  05/21/1956         BSA:          2.339 m Patient Age:    29 years          BP:           137/82 mmHg Patient Gender: F                 HR:           73 bpm. Exam Location:  Inpatient Procedure: 2D Echo, Cardiac Doppler, Color Doppler and Saline Contrast Bubble            Study Indications:    Stroke  History:        Patient has prior history of Echocardiogram examinations, most                 recent 01/05/2022. CAD; Risk Factors:Dyslipidemia.                 Aortic Valve: 26 mm Sapien prosthetic, stented (TAVR) valve is                 present in the aortic position. Procedure Date: 04/17/2019.  Sonographer:    Jefferey Pica Referring Phys: [id number] CAROLE N HALL IMPRESSIONS  1. 26 mm S3. Vmax 2.6 m/s, MG 14.5 mmHG, EOA 1.59 cm2, DI 0.46. Mean gradients has increased from 9 mmHG to 14.5 mmHG. Would recommend TEE or gated cardiac CTA to exclude leaflet thrombosis. The aortic valve has been repaired/replaced. Aortic valve regurgitation is not visualized. There is a 26 mm Sapien prosthetic (TAVR) valve present in the aortic position. Procedure Date: 04/17/2019.  2. Left ventricular ejection fraction, by estimation, is 65 to 70%. The left ventricle has normal function. The left ventricle has no regional wall motion abnormalities. There is moderate concentric left ventricular hypertrophy. Left ventricular diastolic parameters are consistent with Grade I diastolic dysfunction (impaired relaxation).  3. Right ventricular systolic function is normal. The right ventricular size is normal. Tricuspid regurgitation signal is inadequate for assessing PA pressure.  4. The  mitral valve is grossly normal. No evidence of mitral valve regurgitation. No evidence of mitral stenosis.  5. The inferior vena cava is dilated in size with >50% respiratory variability, suggesting right atrial pressure of 8 mmHg.  6. Agitated saline contrast bubble study was negative, with no evidence of any interatrial shunt. FINDINGS  Left Ventricle: Left ventricular ejection fraction, by estimation, is 65 to 70%. The left ventricle has normal function. The left ventricle has no regional wall motion abnormalities. The left ventricular internal cavity size was normal in size. There is  moderate concentric left ventricular hypertrophy. Left ventricular diastolic parameters are consistent with Grade I diastolic dysfunction (impaired relaxation). Right Ventricle: The right ventricular size is normal. No increase in right ventricular wall thickness. Right ventricular systolic function is normal. Tricuspid regurgitation signal is inadequate for  assessing PA pressure. Left Atrium: Left atrial size was normal in size. Right Atrium: Right atrial size was normal in size. Pericardium: There is no evidence of pericardial effusion. Presence of epicardial fat layer. Mitral Valve: The mitral valve is grossly normal. No evidence of mitral valve regurgitation. No evidence of mitral valve stenosis. Tricuspid Valve: The tricuspid valve is grossly normal. Tricuspid valve regurgitation is trivial. No evidence of tricuspid stenosis. Aortic Valve: 26 mm S3. Vmax 2.6 m/s, MG 14.5 mmHG, EOA 1.59 cm2, DI 0.46. Mean gradients has increased from 9 mmHG to 14.5 mmHG. Would recommend TEE or gated cardiac CTA to exclude leaflet thrombosis. The aortic valve has been repaired/replaced. Aortic valve regurgitation is not visualized. Aortic valve mean gradient measures 14.5 mmHg. Aortic valve peak gradient measures 27.1 mmHg. Aortic valve area, by VTI measures 1.59 cm. There is a 26 mm Sapien prosthetic, stented (TAVR) valve present in the aortic  position. Procedure Date: 04/17/2019. Pulmonic Valve: The pulmonic valve was grossly normal. Pulmonic valve regurgitation is not visualized. No evidence of pulmonic stenosis. Aorta: The aortic root and ascending aorta are structurally normal, with no evidence of dilitation. Venous: The inferior vena cava is dilated in size with greater than 50% respiratory variability, suggesting right atrial pressure of 8 mmHg. IAS/Shunts: The atrial septum is grossly normal. Agitated saline contrast was given intravenously to evaluate for intracardiac shunting. Agitated saline contrast bubble study was negative, with no evidence of any interatrial shunt.  LEFT VENTRICLE PLAX 2D LVIDd:         4.80 cm   Diastology LVIDs:         2.60 cm   LV e' medial:    5.68 cm/s LV PW:         1.40 cm   LV E/e' medial:  8.6 LV IVS:        1.30 cm   LV e' lateral:   5.35 cm/s LVOT diam:     2.10 cm   LV E/e' lateral: 9.2 LV SV:         92 LV SV Index:   39 LVOT Area:     3.46 cm  RIGHT VENTRICLE             IVC RV Basal diam:  2.80 cm     IVC diam: 2.20 cm RV S prime:     12.80 cm/s TAPSE (M-mode): 2.4 cm LEFT ATRIUM             Index        RIGHT ATRIUM           Index LA diam:        4.40 cm 1.88 cm/m   RA Area:     13.75 cm LA Vol (A2C):   36.4 ml 15.56 ml/m  RA Volume:   32.00 ml  13.68 ml/m LA Vol (A4C):   50.8 ml 21.72 ml/m LA Biplane Vol: 46.5 ml 19.88 ml/m  AORTIC VALVE                     PULMONIC VALVE AV Area (Vmax):    1.62 cm      PV Vmax:       0.91 m/s AV Area (Vmean):   1.39 cm      PV Peak grad:  3.3 mmHg AV Area (VTI):     1.59 cm AV Vmax:           260.50 cm/s AV Vmean:  177.500 cm/s AV VTI:            0.576 m AV Peak Grad:      27.1 mmHg AV Mean Grad:      14.5 mmHg LVOT Vmax:         122.00 cm/s LVOT Vmean:        71.400 cm/s LVOT VTI:          0.265 m LVOT/AV VTI ratio: 0.46  AORTA Ao Asc diam: 3.10 cm MITRAL VALVE MV Area (PHT): 4.19 cm    SHUNTS MV Decel Time: 181 msec    Systemic VTI:  0.26 m MV E  velocity: 49.10 cm/s  Systemic Diam: 2.10 cm MV A velocity: 80.00 cm/s MV E/A ratio:  0.61 Eleonore Chiquito MD Electronically signed by Eleonore Chiquito MD Signature Date/Time: 03/04/2022/12:56:59 PM    Final    MR BRAIN WO CONTRAST  Result Date: 03/04/2022 CLINICAL DATA:  Neuro deficit with acute stroke suspected. EXAM: MRI HEAD WITHOUT CONTRAST TECHNIQUE: Multiplanar, multiecho pulse sequences of the brain and surrounding structures were obtained without intravenous contrast. COMPARISON:  CT and CTA from yesterday FINDINGS: Brain: No acute infarction, hemorrhage, hydrocephalus, extra-axial collection or mass lesion. Vascular: Preserved flow voids. Bulbous appearance at the right MCA bifurcation, known aneurysm from prior CTA. Skull and upper cervical spine: Cervical facet spurring with C3-4 anterolisthesis. No focal marrow lesion. Sinuses/Orbits: Bilateral cataract resection. Generalized mild mucosal thickening in the paranasal sinuses IMPRESSION: 1. No acute finding.  Negative for infarct. 2. Right MCA aneurysm, see prior CTA. Electronically Signed   By: Jorje Guild M.D.   On: 03/04/2022 05:11   CT ANGIO HEAD NECK W WO CM  Result Date: 03/03/2022 CLINICAL DATA:  Initial evaluation for headache, brain fog. Facial numbness. EXAM: CT ANGIOGRAPHY HEAD AND NECK TECHNIQUE: Multidetector CT imaging of the head and neck was performed using the standard protocol during bolus administration of intravenous contrast. Multiplanar CT image reconstructions and MIPs were obtained to evaluate the vascular anatomy. Carotid stenosis measurements (when applicable) are obtained utilizing NASCET criteria, using the distal internal carotid diameter as the denominator. RADIATION DOSE REDUCTION: This exam was performed according to the departmental dose-optimization program which includes automated exposure control, adjustment of the mA and/or kV according to patient size and/or use of iterative reconstruction technique. CONTRAST:   37m OMNIPAQUE IOHEXOL 350 MG/ML SOLN COMPARISON:  CT from earlier the same day. FINDINGS: CTA NECK FINDINGS Aortic arch: Visualized aortic arch normal in caliber. Bovine branching pattern noted. No stenosis about the origin of the great vessels. Right carotid system: Right common and internal carotid arteries are patent without stenosis, dissection or occlusion. Left carotid system: Left common and internal carotid arteries are patent without stenosis, dissection or occlusion. Vertebral arteries: Both vertebral arteries arise from subclavian arteries. No proximal subclavian artery stenosis. Vertebral arteries patent without stenosis, dissection or occlusion. Skeleton: No discrete or worrisome osseous lesions. Moderate spondylosis present at C4-5 through C6-7. Degenerative changes noted about the right TMJ. Other neck: No other acute soft tissue abnormality within the neck. Upper chest: Visualized upper chest demonstrates no acute finding. Review of the MIP images confirms the above findings CTA HEAD FINDINGS Anterior circulation: Both internal carotid arteries patent to the termini without stenosis. A1 segments patent bilaterally. Negative anterior communicating artery complex. Both ACAs patent to their distal aspects without stenosis. No M1 stenosis or occlusion. 5-6 mm saccular aneurysm seen arising from the right MCA bifurcation (series 8, image 103). No proximal MCA branch occlusion.  Distal MCA branches perfused and symmetric. Posterior circulation: Both V4 segments patent without stenosis. Both PICA patent. Basilar patent to its distal aspect without stenosis. Superior cerebral arteries patent bilaterally. Both PCAs primarily supplied via the basilar well perfused or distal aspects. Venous sinuses: Patent allowing for timing the contrast bolus. DVA noted at the right cerebellum. Anatomic variants: None significant. Review of the MIP images confirms the above findings IMPRESSION: 1. Negative CTA of the head and  neck for large vessel occlusion or other emergent finding. No significant atheromatous disease for age. No hemodynamically significant or correctable stenosis. 2. 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 Electronically Signed   By: Jeannine Boga M.D.   On: 03/03/2022 22:00   CT HEAD CODE STROKE WO CONTRAST  Result Date: 03/03/2022 CLINICAL DATA:  Code stroke. Initial evaluation for neuro deficit, stroke suspected. EXAM: CT HEAD WITHOUT CONTRAST TECHNIQUE: Contiguous axial images were obtained from the base of the skull through the vertex without intravenous contrast. RADIATION DOSE REDUCTION: This exam was performed according to the departmental dose-optimization program which includes automated exposure control, adjustment of the mA and/or kV according to patient size and/or use of iterative reconstruction technique. COMPARISON:  None Available. FINDINGS: Brain: Cerebral volume within normal limits. No acute intracranial hemorrhage. No acute large vessel territory infarct. No mass lesion, midline shift or mass effect. No hydrocephalus or extra-axial fluid collection. Vascular: No abnormal hyperdense vessel. Skull: Scalp soft tissues and calvarium within normal limits. Sinuses/Orbits: Globes orbital soft tissues within normal limits. Paranasal sinuses are largely clear. No mastoid effusion. Other: None. ASPECTS [facility] Stroke Program Early CT Score) - Ganglionic level infarction (caudate, lentiform nuclei, internal capsule, insula, M1-M3 cortex): 7 - Supraganglionic infarction (M4-M6 cortex): 3 Total score (0-10 with 10 being normal): 10 IMPRESSION: 1. No acute intracranial abnormality. 2. ASPECTS is 10. Critical Value/emergent results were called by telephone at the time of interpretation on 03/03/2022 at 9:01 pm to provider [facility] , who verbally acknowledged these results. Electronically Signed   By: Jeannine Boga M.D.   On: 03/03/2022 21:02     Labs:  CBC: Recent Labs    03/27/21 1647 12/23/21 1201 03/03/22 2115 03/05/22 0213  WBC 8.2 7.3 9.3 9.1  HGB 15.4* 14.5 13.5 13.4  HCT 46.6* 43.0 39.4 38.8  PLT 210.0 272.0 265 268    COAGS: Recent Labs    03/27/21 1647 03/03/22 2315  INR 1.1* 1.1  APTT  --  28    BMP: Recent Labs    03/27/21 1647 03/03/22 2115 03/05/22 0213  NA 136 137 139  K 4.0 4.3 3.9  CL 103 103 107  CO2 '24 25 24  '$ GLUCOSE 103* 122* 149*  BUN '14 10 13  '$ CALCIUM 9.4 8.8* 8.7*  CREATININE 0.68 0.74 0.75  GFRNONAA  --  >60 >60    LIVER FUNCTION TESTS: Recent Labs    03/03/22 2115  BILITOT 0.3  AST 20  ALT 12  ALKPHOS 88  PROT 6.7  ALBUMIN 3.7    TUMOR MARKERS: No results for input(s): "AFPTM", "CEA", "CA199", "CHROMGRNA" in the last 8760 hours.  Assessment and Plan:  Mrs. Tierney is a very pleasant 66 year old female with incidental right MCA bifurcation aneurysm.  We discussed the natural history of brain aneurysms including the fact that it is unlikely to be the cause of her recurrent temporal headache.  I explained management options including open microsurgical clipping, endovascular embolization as well as medical management.  She says she  would rather have her aneurysm treated for fear of the consequences of a possible aneurysm rupture.  I explained to her that a catheter cerebral angiogram could give [place] further details of the aneurysm as well as its relationship with the parent and daughter arteries and that this could help plan treatment and stratify risk.  She would like to proceed with the diagnostic cerebral angiogram under moderate sedation.  Our schedulers will reach out to her to arrange the cerebral angiogram.   Thank you for this interesting consult.  I greatly enjoyed meeting [name] and look forward to participating in her care.  A copy of this report was sent to the requesting provider on this date.  Electronically Signed: Pedro Earls,  MD 03/10/2022, 12:55 PM   I spent a total of  40 Minutes   in face to face in clinical consultation, greater than 50% of which was counseling/coordinating care for brain aneurysm.

## 2022-03-10 NOTE — Telephone Encounter (Signed)
Called to schedule diagnostic angiogram with Dr. Debbrah Alar, no answer, left vm. AW

## 2022-03-11 DIAGNOSIS — Z6841 Body Mass Index (BMI) 40.0 and over, adult: Secondary | ICD-10-CM | POA: Diagnosis not present

## 2022-03-11 DIAGNOSIS — I671 Cerebral aneurysm, nonruptured: Secondary | ICD-10-CM | POA: Diagnosis not present

## 2022-03-11 DIAGNOSIS — R200 Anesthesia of skin: Secondary | ICD-10-CM | POA: Diagnosis not present

## 2022-03-11 DIAGNOSIS — Z09 Encounter for follow-up examination after completed treatment for conditions other than malignant neoplasm: Secondary | ICD-10-CM | POA: Diagnosis not present

## 2022-03-11 DIAGNOSIS — I700 Atherosclerosis of aorta: Secondary | ICD-10-CM | POA: Diagnosis not present

## 2022-03-15 DIAGNOSIS — M4316 Spondylolisthesis, lumbar region: Secondary | ICD-10-CM | POA: Diagnosis not present

## 2022-03-15 DIAGNOSIS — M47816 Spondylosis without myelopathy or radiculopathy, lumbar region: Secondary | ICD-10-CM | POA: Diagnosis not present

## 2022-03-15 DIAGNOSIS — B91 Sequelae of poliomyelitis: Secondary | ICD-10-CM | POA: Diagnosis not present

## 2022-03-18 ENCOUNTER — Other Ambulatory Visit: Payer: Self-pay | Admitting: Radiology

## 2022-03-18 DIAGNOSIS — I671 Cerebral aneurysm, nonruptured: Secondary | ICD-10-CM

## 2022-03-22 NOTE — H&P (Incomplete)
Chief Complaint: Patient was seen in consultation today for  brain aneurysm at the request of Rosalin Hawking, MD  Referring Physician(s): Rosalin Hawking, MD  Supervising Physician: Pedro Earls  Patient Status: [facility] - Out-pt  History of Present Illness: [name] is a 66 y.o. 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 Karenann Cai today.   Patient is known to Dr. Karenann Cai, she was referred by neurology to discuss endovascular treatment options for the right MCA bifurcation aneurysm which was found during work up in September 2023. During consultation visit on 03/10/22, a diagnostic cerebral angiogram to evaluate details of the aneurysm as well as its relationship with the parent and daughter arteries which could help plan treatment and stratify risk. After thorough discussion and shared decision making, patient decided to proceed with diagnostic cerebral angiogram.   Patient presents to [facility] today.  ***   Past Medical History:  Diagnosis Date   Allergy    seasonal allergies   Anxiety    on meds   Arthritis    low back issues   Carpal tunnel syndrome    LEFT   Cataract    bilateral -sx   Cervical pain (neck)    Complication of anesthesia    DIFFICULTY WAKING UP   Coronary artery disease    Depression    on meds   GERD (gastroesophageal reflux disease)    on meds   Hyperlipidemia    diet controlled- not on meds at this time (08/05/2020)   Insomnia    Mass of right lower leg    Morbid obesity (HCC)    OSA on CPAP    uses CPAP nightly   Osteopenia    neck   PONV (postoperative nausea and vomiting)    Post-polio syndrome    With left leg paralysis   S/P TAVR (transcatheter  aortic valve replacement)    Sigmoid diverticulosis    Ulnar nerve abnormality     Past Surgical History:  Procedure Laterality Date   AMPUTATION TOE Left 02/18/2022   Procedure: Second and Third distal interphalangeal and Fourth proximal interphalangeal  toe amputations;  Surgeon: Wylene Simmer, MD;  Location: [place];  Service: Orthopedics;  Laterality: Left;   ANKLE FUSION     left   APPENDECTOMY     BIOPSY  05/04/2021   Procedure: BIOPSY;  Surgeon: Rush Landmark Telford Nab., MD;  Location: [facility];  Service: Gastroenterology;;   CHOLECYSTECTOMY     COLONOSCOPY     COLONOSCOPY N/A 05/04/2021   Procedure: COLONOSCOPY;  Surgeon: Irving Copas., MD;  Location: [facility];  Service: Gastroenterology;  Laterality: N/A;   ESOPHAGOGASTRODUODENOSCOPY (EGD) WITH PROPOFOL N/A 05/04/2021   Procedure: ESOPHAGOGASTRODUODENOSCOPY (EGD) WITH PROPOFOL;  Surgeon: Rush Landmark Telford Nab., MD;  Location: [facility];  Service: Gastroenterology;  Laterality: N/A;   EYE SURGERY Bilateral 2020   cataracts removed August and September 2020   JOINT REPLACEMENT Right 2003   three replacements; 2003 is last one   KNEE ARTHROPLASTY     LEG SURGERY     muscle surgery related to polio   MASS EXCISION Right 01/08/2020   Procedure: EXCISION RIGHT LEG MASS;  Surgeon: Erroll Luna, MD;  Location: [facility];  Service: General;  Laterality: Right;   POLYPECTOMY  05/04/2021   Procedure: POLYPECTOMY;  Surgeon: Rush Landmark Telford Nab., MD;  Location: [facility];  Service: Gastroenterology;;   RECTAL PROLAPSE REPAIR  2020   pessary placed   RIGHT/LEFT HEART CATH AND CORONARY ANGIOGRAPHY N/A 04/02/2019   Procedure: RIGHT/LEFT HEART CATH AND CORONARY ANGIOGRAPHY;  Surgeon: Nelva Bush, MD;  Location: [facility];  Service: Cardiovascular;  Laterality: N/A;   SAVORY DILATION N/A 05/04/2021   Procedure: SAVORY DILATION;  Surgeon: Rush Landmark Telford Nab., MD;   Location: [facility];  Service: Gastroenterology;  Laterality: N/A;   TEE WITHOUT CARDIOVERSION N/A 04/17/2019   Procedure: TRANSESOPHAGEAL ECHOCARDIOGRAM (TEE);  Surgeon: Sherren Mocha, MD;  Location: [place] [facility];  Service: Open Heart Surgery;  Laterality: N/A;   TOTAL HIP ARTHROPLASTY     right x 3   TRANSCATHETER AORTIC VALVE REPLACEMENT, TRANSFEMORAL N/A 04/17/2019   Procedure: TRANSCATHETER AORTIC VALVE REPLACEMENT, TRANSFEMORAL;  Surgeon: Sherren Mocha, MD;  Location: [place] [facility];  Service: Open Heart Surgery;  Laterality: N/A;   UMBILICAL HERNIA REPAIR      Allergies: Amoxicillin, Flagyl [metronidazole], Plaquenil [hydroxychloroquine sulfate], Codeine, Doxycycline, Other, Percocet [oxycodone-acetaminophen], and Sulfa antibiotics  Medications: Prior to Admission medications   Medication Sig Start Date End Date Taking? Authorizing Provider  acetaminophen (TYLENOL) 500 MG tablet Take 1,000 mg by mouth every 6 (six) hours as needed for moderate pain or headache.   Yes [provider]  ALPRAZolam Duanne Moron) 0.5 MG tablet Take 0.5 mg by mouth 2 (two) times daily as needed for sleep.    Yes [provider]  Aspirin 81 MG CAPS Take 1 mg by mouth daily.   Yes [provider]  cetirizine (ZYRTEC) 10 MG tablet Take 10 mg by mouth as needed for allergies.   Yes [provider]  Cholecalciferol 50 MCG (2000 UT) TABS Take 2,000 Units by mouth daily.    Yes [provider]  Docusate Sodium (COLACE PO) Take 2 capsules by mouth daily.   Yes [provider]  fluticasone (FLONASE) 50 MCG/ACT nasal spray Place 1 spray into both nostrils as needed for allergies or rhinitis.   Yes [provider]  gabapentin (NEURONTIN) 300 MG capsule Take 300 mg by mouth daily.   Yes [provider]  ibuprofen (ADVIL) 200 MG tablet Take 200 mg by mouth every 6 (six) hours as needed for moderate pain.   Yes [provider]   pantoprazole (PROTONIX) 40 MG tablet Take 40 mg by mouth daily.   Yes [provider]  Polyethyl Glycol-Propyl Glycol (SYSTANE OP) Place 1-2 drops into both eyes as needed (dry eyes).   Yes [provider]  rosuvastatin (CRESTOR) 20 MG tablet Take 1 tablet (20 mg total) by mouth daily. Patient taking differently: Take 20 mg by mouth at bedtime. 03/04/22  Yes Elgergawy, Silver Huguenin, MD  traZODone (DESYREL) 100 MG tablet Take 100 mg by mouth at bedtime as needed for sleep.   Yes [provider]  venlafaxine XR (EFFEXOR-XR) 75 MG 24 hr capsule Take 75 mg by mouth daily with breakfast.   Yes [provider]  docusate sodium (COLACE) 100 MG capsule Take 1 capsule (100 mg total) by mouth 2 (two) times daily. While taking narcotic pain medicine. Patient not taking: Reported on 03/18/2022 02/18/22   Corky Sing, PA-C     Family History  Problem Relation Age of Onset   Ovarian cancer Mother 57   Colon cancer  Mother 67       mets from ovarian CA   Heart disease Father    CAD Other        FAM Hx OF CAD   Diabetes Other        FAM Hx of DM   Hypertension Other    Esophageal cancer Neg Hx    Rectal cancer Neg Hx    Stomach cancer Neg Hx    Inflammatory bowel disease Neg Hx    Liver disease Neg Hx    Pancreatic cancer Neg Hx     Social History   Socioeconomic History   Marital status: Single    Spouse name: Not on file   Number of children: Not on file   Years of education: Not on file   Highest education level: Not on file  Occupational History   Not on file  Tobacco Use   Smoking status: Never   Smokeless tobacco: Never  Vaping Use   Vaping Use: Never used  Substance and Sexual Activity   Alcohol use: No   Drug use: Never   Sexual activity: Not on file  Other Topics Concern   Not on file  Social History Narrative   Lives alone.   Social Determinants of Health   Financial Resource Strain: Not on file  Food Insecurity: Not on file   Transportation Needs: Not on file  Physical Activity: Not on file  Stress: Not on file  Social Connections: Not on file     Review of Systems: A 12 point ROS discussed and pertinent positives are indicated in the HPI above.  All other systems are negative.  Vital Signs: There were no vitals taken for this visit.  *** Physical Exam     Imaging: ECHOCARDIOGRAM COMPLETE BUBBLE STUDY  Result Date: 03/04/2022    ECHOCARDIOGRAM REPORT   Patient Name:   MARLO ARRIOLA Date of Exam: 03/04/2022 Medical Rec #:  [id number]         Height:       68.0 in Accession #:    [id number]        Weight:       274.7 lb Date of Birth:  Oct 20, 1955         BSA:          2.339 m Patient Age:    29 years          BP:           137/82 mmHg Patient Gender: F                 HR:           73 bpm. Exam Location:  Inpatient Procedure: 2D Echo, Cardiac Doppler, Color Doppler and Saline Contrast Bubble            Study Indications:    Stroke  History:        Patient has prior history of Echocardiogram examinations, most                 recent 01/05/2022. CAD; Risk Factors:Dyslipidemia.                 Aortic Valve: 26 mm Sapien prosthetic, stented (TAVR) valve is                 present in the aortic position. Procedure Date: 04/17/2019.  Sonographer:    Jefferey Pica Referring Phys: [id number] CAROLE N HALL IMPRESSIONS  1. 26 mm S3. Vmax 2.6 m/s,  MG 14.5 mmHG, EOA 1.59 cm2, DI 0.46. Mean gradients has increased from 9 mmHG to 14.5 mmHG. Would recommend TEE or gated cardiac CTA to exclude leaflet thrombosis. The aortic valve has been repaired/replaced. Aortic valve regurgitation is not visualized. There is a 26 mm Sapien prosthetic (TAVR) valve present in the aortic position. Procedure Date: 04/17/2019.  2. Left ventricular ejection fraction, by estimation, is 65 to 70%. The left ventricle has normal function. The left ventricle has no regional wall motion abnormalities. There is moderate concentric left ventricular hypertrophy.  Left ventricular diastolic parameters are consistent with Grade I diastolic dysfunction (impaired relaxation).  3. Right ventricular systolic function is normal. The right ventricular size is normal. Tricuspid regurgitation signal is inadequate for assessing PA pressure.  4. The mitral valve is grossly normal. No evidence of mitral valve regurgitation. No evidence of mitral stenosis.  5. The inferior vena cava is dilated in size with >50% respiratory variability, suggesting right atrial pressure of 8 mmHg.  6. Agitated saline contrast bubble study was negative, with no evidence of any interatrial shunt. FINDINGS  Left Ventricle: Left ventricular ejection fraction, by estimation, is 65 to 70%. The left ventricle has normal function. The left ventricle has no regional wall motion abnormalities. The left ventricular internal cavity size was normal in size. There is  moderate concentric left ventricular hypertrophy. Left ventricular diastolic parameters are consistent with Grade I diastolic dysfunction (impaired relaxation). Right Ventricle: The right ventricular size is normal. No increase in right ventricular wall thickness. Right ventricular systolic function is normal. Tricuspid regurgitation signal is inadequate for assessing PA pressure. Left Atrium: Left atrial size was normal in size. Right Atrium: Right atrial size was normal in size. Pericardium: There is no evidence of pericardial effusion. Presence of epicardial fat layer. Mitral Valve: The mitral valve is grossly normal. No evidence of mitral valve regurgitation. No evidence of mitral valve stenosis. Tricuspid Valve: The tricuspid valve is grossly normal. Tricuspid valve regurgitation is trivial. No evidence of tricuspid stenosis. Aortic Valve: 26 mm S3. Vmax 2.6 m/s, MG 14.5 mmHG, EOA 1.59 cm2, DI 0.46. Mean gradients has increased from 9 mmHG to 14.5 mmHG. Would recommend TEE or gated cardiac CTA to exclude leaflet thrombosis. The aortic valve has been  repaired/replaced. Aortic valve regurgitation is not visualized. Aortic valve mean gradient measures 14.5 mmHg. Aortic valve peak gradient measures 27.1 mmHg. Aortic valve area, by VTI measures 1.59 cm. There is a 26 mm Sapien prosthetic, stented (TAVR) valve present in the aortic position. Procedure Date: 04/17/2019. Pulmonic Valve: The pulmonic valve was grossly normal. Pulmonic valve regurgitation is not visualized. No evidence of pulmonic stenosis. Aorta: The aortic root and ascending aorta are structurally normal, with no evidence of dilitation. Venous: The inferior vena cava is dilated in size with greater than 50% respiratory variability, suggesting right atrial pressure of 8 mmHg. IAS/Shunts: The atrial septum is grossly normal. Agitated saline contrast was given intravenously to evaluate for intracardiac shunting. Agitated saline contrast bubble study was negative, with no evidence of any interatrial shunt.  LEFT VENTRICLE PLAX 2D LVIDd:         4.80 cm   Diastology LVIDs:         2.60 cm   LV e' medial:    5.68 cm/s LV PW:         1.40 cm   LV E/e' medial:  8.6 LV IVS:        1.30 cm   LV e' lateral:   5.35  cm/s LVOT diam:     2.10 cm   LV E/e' lateral: 9.2 LV SV:         92 LV SV Index:   39 LVOT Area:     3.46 cm  RIGHT VENTRICLE             IVC RV Basal diam:  2.80 cm     IVC diam: 2.20 cm RV S prime:     12.80 cm/s TAPSE (M-mode): 2.4 cm LEFT ATRIUM             Index        RIGHT ATRIUM           Index LA diam:        4.40 cm 1.88 cm/m   RA Area:     13.75 cm LA Vol (A2C):   36.4 ml 15.56 ml/m  RA Volume:   32.00 ml  13.68 ml/m LA Vol (A4C):   50.8 ml 21.72 ml/m LA Biplane Vol: 46.5 ml 19.88 ml/m  AORTIC VALVE                     PULMONIC VALVE AV Area (Vmax):    1.62 cm      PV Vmax:       0.91 m/s AV Area (Vmean):   1.39 cm      PV Peak grad:  3.3 mmHg AV Area (VTI):     1.59 cm AV Vmax:           260.50 cm/s AV Vmean:          177.500 cm/s AV VTI:            0.576 m AV Peak Grad:      27.1  mmHg AV Mean Grad:      14.5 mmHg LVOT Vmax:         122.00 cm/s LVOT Vmean:        71.400 cm/s LVOT VTI:          0.265 m LVOT/AV VTI ratio: 0.46  AORTA Ao Asc diam: 3.10 cm MITRAL VALVE MV Area (PHT): 4.19 cm    SHUNTS MV Decel Time: 181 msec    Systemic VTI:  0.26 m MV E velocity: 49.10 cm/s  Systemic Diam: 2.10 cm MV A velocity: 80.00 cm/s MV E/A ratio:  0.61 Eleonore Chiquito MD Electronically signed by Eleonore Chiquito MD Signature Date/Time: 03/04/2022/12:56:59 PM    Final    MR BRAIN WO CONTRAST  Result Date: 03/04/2022 CLINICAL DATA:  Neuro deficit with acute stroke suspected. EXAM: MRI HEAD WITHOUT CONTRAST TECHNIQUE: Multiplanar, multiecho pulse sequences of the brain and surrounding structures were obtained without intravenous contrast. COMPARISON:  CT and CTA from yesterday FINDINGS: Brain: No acute infarction, hemorrhage, hydrocephalus, extra-axial collection or mass lesion. Vascular: Preserved flow voids. Bulbous appearance at the right MCA bifurcation, known aneurysm from prior CTA. Skull and upper cervical spine: Cervical facet spurring with C3-4 anterolisthesis. No focal marrow lesion. Sinuses/Orbits: Bilateral cataract resection. Generalized mild mucosal thickening in the paranasal sinuses IMPRESSION: 1. No acute finding.  Negative for infarct. 2. Right MCA aneurysm, see prior CTA. Electronically Signed   By: Jorje Guild M.D.   On: 03/04/2022 05:11   CT ANGIO HEAD NECK W WO CM  Result Date: 03/03/2022 CLINICAL DATA:  Initial evaluation for headache, brain fog. Facial numbness. EXAM: CT ANGIOGRAPHY HEAD AND NECK TECHNIQUE: Multidetector CT imaging of the head and neck was performed using the standard protocol  during bolus administration of intravenous contrast. Multiplanar CT image reconstructions and MIPs were obtained to evaluate the vascular anatomy. Carotid stenosis measurements (when applicable) are obtained utilizing NASCET criteria, using the distal internal carotid diameter as the  denominator. RADIATION DOSE REDUCTION: This exam was performed according to the departmental dose-optimization program which includes automated exposure control, adjustment of the mA and/or kV according to patient size and/or use of iterative reconstruction technique. CONTRAST:  69m OMNIPAQUE IOHEXOL 350 MG/ML SOLN COMPARISON:  CT from earlier the same day. FINDINGS: CTA NECK FINDINGS Aortic arch: Visualized aortic arch normal in caliber. Bovine branching pattern noted. No stenosis about the origin of the great vessels. Right carotid system: Right common and internal carotid arteries are patent without stenosis, dissection or occlusion. Left carotid system: Left common and internal carotid arteries are patent without stenosis, dissection or occlusion. Vertebral arteries: Both vertebral arteries arise from subclavian arteries. No proximal subclavian artery stenosis. Vertebral arteries patent without stenosis, dissection or occlusion. Skeleton: No discrete or worrisome osseous lesions. Moderate spondylosis present at C4-5 through C6-7. Degenerative changes noted about the right TMJ. Other neck: No other acute soft tissue abnormality within the neck. Upper chest: Visualized upper chest demonstrates no acute finding. Review of the MIP images confirms the above findings CTA HEAD FINDINGS Anterior circulation: Both internal carotid arteries patent to the termini without stenosis. A1 segments patent bilaterally. Negative anterior communicating artery complex. Both ACAs patent to their distal aspects without stenosis. No M1 stenosis or occlusion. 5-6 mm saccular aneurysm seen arising from the right MCA bifurcation (series 8, image 103). No proximal MCA branch occlusion. Distal MCA branches perfused and symmetric. Posterior circulation: Both V4 segments patent without stenosis. Both PICA patent. Basilar patent to its distal aspect without stenosis. Superior cerebral arteries patent bilaterally. Both PCAs primarily supplied  via the basilar well perfused or distal aspects. Venous sinuses: Patent allowing for timing the contrast bolus. DVA noted at the right cerebellum. Anatomic variants: None significant. Review of the MIP images confirms the above findings IMPRESSION: 1. Negative CTA of the head and neck for large vessel occlusion or other emergent finding. No significant atheromatous disease for age. No hemodynamically significant or correctable stenosis. 2. 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 Electronically Signed   By: BJeannine BogaM.D.   On: 03/03/2022 22:00   CT HEAD CODE STROKE WO CONTRAST  Result Date: 03/03/2022 CLINICAL DATA:  Code stroke. Initial evaluation for neuro deficit, stroke suspected. EXAM: CT HEAD WITHOUT CONTRAST TECHNIQUE: Contiguous axial images were obtained from the base of the skull through the vertex without intravenous contrast. RADIATION DOSE REDUCTION: This exam was performed according to the departmental dose-optimization program which includes automated exposure control, adjustment of the mA and/or kV according to patient size and/or use of iterative reconstruction technique. COMPARISON:  None Available. FINDINGS: Brain: Cerebral volume within normal limits. No acute intracranial hemorrhage. No acute large vessel territory infarct. No mass lesion, midline shift or mass effect. No hydrocephalus or extra-axial fluid collection. Vascular: No abnormal hyperdense vessel. Skull: Scalp soft tissues and calvarium within normal limits. Sinuses/Orbits: Globes orbital soft tissues within normal limits. Paranasal sinuses are largely clear. No mastoid effusion. Other: None. ASPECTS ([facility] Early CT Score) - Ganglionic level infarction (caudate, lentiform nuclei, internal capsule, insula, M1-M3 cortex): 7 - Supraganglionic infarction (M4-M6 cortex): 3 Total score (0-10 with 10 being normal): 10 IMPRESSION: 1. No acute intracranial abnormality. 2. ASPECTS is 10.  Critical Value/emergent results were called by  telephone at the time of interpretation on 03/03/2022 at 9:01 pm to provider [facility] , who verbally acknowledged these results. Electronically Signed   By: Jeannine Boga M.D.   On: 03/03/2022 21:02    Labs:  CBC: Recent Labs    03/27/21 1647 12/23/21 1201 03/03/22 2115 03/05/22 0213  WBC 8.2 7.3 9.3 9.1  HGB 15.4* 14.5 13.5 13.4  HCT 46.6* 43.0 39.4 38.8  PLT 210.0 272.0 265 268    COAGS: Recent Labs    03/27/21 1647 03/03/22 2315  INR 1.1* 1.1  APTT  --  28    BMP: Recent Labs    03/27/21 1647 03/03/22 2115 03/05/22 0213  NA 136 137 139  K 4.0 4.3 3.9  CL 103 103 107  CO2 '24 25 24  '$ GLUCOSE 103* 122* 149*  BUN '14 10 13  '$ CALCIUM 9.4 8.8* 8.7*  CREATININE 0.68 0.74 0.75  GFRNONAA  --  >60 >60    LIVER FUNCTION TESTS: Recent Labs    03/03/22 2115  BILITOT 0.3  AST 20  ALT 12  ALKPHOS 88  PROT 6.7  ALBUMIN 3.7    TUMOR MARKERS: No results for input(s): "AFPTM", "CEA", "CA199", "CHROMGRNA" in the last 8760 hours.  Assessment and Plan: 66 y.o. female with right MCA bifurcation aneurysm who is in need of diagnostic cerebral angiogram for further evaluated details of the aneurysm.   NPO since MN VSS CBC *** INR *** BMP *** Patient on Asa 81 mg  Risks and benefits of cerebral angiogram were discussed with the patient including, but not limited to bleeding, infection, vascular injury, contrast induced renal failure, stroke or even death.  This interventional procedure involves the use of X-rays and because of the nature of the planned procedure, it is possible that we will have prolonged use of X-ray fluoroscopy.  Potential radiation risks to you include (but are not limited to) the following: - A slightly elevated risk for cancer  several years later in life. This risk is typically less than 0.5% percent. This risk is low in comparison to the normal incidence of human cancer, which is 33%  for women and 50% for men according to the [place]. - Radiation induced injury can include skin redness, resembling a rash, tissue breakdown / ulcers and hair loss (which can be temporary or permanent).   The likelihood of either of these occurring depends on the difficulty of the procedure and whether you are sensitive to radiation due to previous procedures, disease, or genetic conditions.   IF your procedure requires a prolonged use of radiation, you will be notified and given written instructions for further action.  It is your responsibility to monitor the irradiated area for the 2 weeks following the procedure and to notify your physician if you are concerned that you have suffered a radiation induced injury.    All of the patient's questions were answered, patient is agreeable to proceed.  Consent signed and in chart.   Thank you for this interesting consult.  I greatly enjoyed meeting [name] and look forward to participating in their care.  A copy of this report was sent to the requesting provider on this date.  Electronically Signed: Tera Mater, PA-C 03/22/2022, 1:32 PM   I spent a total of   25 Minutes in face to face in clinical consultation, greater than 50% of which was counseling/coordinating care for diagnostic cerebral angiogram.   This chart was dictated using voice recognition software.  Despite best efforts to proofread,  errors can occur which can change the documentation meaning.

## 2022-03-23 ENCOUNTER — Other Ambulatory Visit (HOSPITAL_COMMUNITY): Payer: Self-pay | Admitting: Neuroradiology

## 2022-03-23 ENCOUNTER — Encounter (HOSPITAL_COMMUNITY): Payer: Self-pay

## 2022-03-23 ENCOUNTER — Ambulatory Visit (HOSPITAL_COMMUNITY)
Admission: RE | Admit: 2022-03-23 | Discharge: 2022-03-23 | Disposition: A | Discharge status: Home / Self Care | Payer: Medicare HMO | Source: Ambulatory Visit | Attending: Neuroradiology | Admitting: Neuroradiology

## 2022-03-23 ENCOUNTER — Other Ambulatory Visit: Payer: Self-pay

## 2022-03-23 DIAGNOSIS — G4733 Obstructive sleep apnea (adult) (pediatric): Secondary | ICD-10-CM | POA: Diagnosis not present

## 2022-03-23 DIAGNOSIS — E785 Hyperlipidemia, unspecified: Secondary | ICD-10-CM | POA: Insufficient documentation

## 2022-03-23 DIAGNOSIS — I2510 Atherosclerotic heart disease of native coronary artery without angina pectoris: Secondary | ICD-10-CM | POA: Insufficient documentation

## 2022-03-23 DIAGNOSIS — I110 Hypertensive heart disease with heart failure: Secondary | ICD-10-CM | POA: Diagnosis not present

## 2022-03-23 DIAGNOSIS — Z7982 Long term (current) use of aspirin: Secondary | ICD-10-CM | POA: Insufficient documentation

## 2022-03-23 DIAGNOSIS — G14 Postpolio syndrome: Secondary | ICD-10-CM | POA: Insufficient documentation

## 2022-03-23 DIAGNOSIS — I720 Aneurysm of carotid artery: Secondary | ICD-10-CM | POA: Diagnosis not present

## 2022-03-23 DIAGNOSIS — I5032 Chronic diastolic (congestive) heart failure: Secondary | ICD-10-CM | POA: Insufficient documentation

## 2022-03-23 DIAGNOSIS — I671 Cerebral aneurysm, nonruptured: Secondary | ICD-10-CM | POA: Diagnosis not present

## 2022-03-23 DIAGNOSIS — Z6838 Body mass index (BMI) 38.0-38.9, adult: Secondary | ICD-10-CM | POA: Insufficient documentation

## 2022-03-23 HISTORY — PX: IR 3D INDEPENDENT WKST: IMG2385

## 2022-03-23 HISTORY — PX: IR US GUIDE VASC ACCESS RIGHT: IMG2390

## 2022-03-23 HISTORY — PX: IR ANGIO VERTEBRAL SEL VERTEBRAL UNI L MOD SED: IMG5367

## 2022-03-23 HISTORY — PX: IR ANGIO INTRA EXTRACRAN SEL INTERNAL CAROTID BILAT MOD SED: IMG5363

## 2022-03-23 LAB — PROTIME-INR
INR: 1.1 (ref 0.8–1.2)
Prothrombin Time: 13.6 seconds (ref 11.4–15.2)

## 2022-03-23 LAB — CBC
HCT: 43.7 % (ref 36.0–46.0)
Hemoglobin: 15 g/dL (ref 12.0–15.0)
MCH: 32.4 pg (ref 26.0–34.0)
MCHC: 34.3 g/dL (ref 30.0–36.0)
MCV: 94.4 fL (ref 80.0–100.0)
Platelets: 227 10*3/uL (ref 150–400)
RBC: 4.63 MIL/uL (ref 3.87–5.11)
RDW: 12.8 % (ref 11.5–15.5)
WBC: 6.8 10*3/uL (ref 4.0–10.5)
nRBC: 0 % (ref 0.0–0.2)

## 2022-03-23 LAB — BASIC METABOLIC PANEL
Anion gap: 9 (ref 5–15)
BUN: 13 mg/dL (ref 8–23)
CO2: 23 mmol/L (ref 22–32)
Calcium: 9.1 mg/dL (ref 8.9–10.3)
Chloride: 105 mmol/L (ref 98–111)
Creatinine, Ser: 0.59 mg/dL (ref 0.44–1.00)
GFR, Estimated: >60 mL/min (ref >60)
Glucose, Bld: 127 mg/dL — ABNORMAL HIGH (ref 70–99)
Potassium: 3.8 mmol/L (ref 3.5–5.1)
Sodium: 137 mmol/L (ref 135–145)

## 2022-03-23 MED ORDER — ONDANSETRON HCL 4 MG/2ML IJ SOLN
INTRAMUSCULAR | Status: AC | PRN
  Administered 2022-03-23: 4 mg via INTRAVENOUS

## 2022-03-23 MED ORDER — FENTANYL CITRATE (PF) 100 MCG/2ML IJ SOLN
INTRAMUSCULAR | Status: AC | PRN
  Administered 2022-03-23 (×4): 25 ug via INTRAVENOUS

## 2022-03-23 MED ORDER — SODIUM CHLORIDE 0.9 % IV SOLN: Freq: Once | INTRAVENOUS | Status: AC

## 2022-03-23 MED ORDER — ACETAMINOPHEN 325 MG PO TABS
650.0000 mg | ORAL_TABLET | Freq: Four times a day (QID) | ORAL | Status: DC | PRN
  Administered 2022-03-23: 650 mg via ORAL
  Filled 2022-03-23: qty 2

## 2022-03-23 MED ORDER — IOHEXOL 300 MG/ML  SOLN
100.0000 mL | Freq: Once | INTRAMUSCULAR | Status: AC | PRN
  Administered 2022-03-23: 85 mL via INTRA_ARTERIAL

## 2022-03-23 MED ORDER — MIDAZOLAM HCL 2 MG/2ML IJ SOLN
INTRAMUSCULAR | Status: AC | PRN
  Administered 2022-03-23: 1 mg via INTRAVENOUS
  Administered 2022-03-23: .5 mg via INTRAVENOUS

## 2022-03-23 MED ORDER — MIDAZOLAM HCL 2 MG/2ML IJ SOLN
INTRAMUSCULAR | Status: AC
  Filled 2022-03-23: qty 2

## 2022-03-23 MED ORDER — ONDANSETRON HCL 4 MG/2ML IJ SOLN
INTRAMUSCULAR | Status: AC
  Filled 2022-03-23: qty 2

## 2022-03-23 MED ORDER — LABETALOL HCL 5 MG/ML IV SOLN
INTRAVENOUS | Status: AC | PRN
  Administered 2022-03-23 (×2): 5 mg via INTRAVENOUS

## 2022-03-23 MED ORDER — FENTANYL CITRATE (PF) 100 MCG/2ML IJ SOLN
INTRAMUSCULAR | Status: AC
  Filled 2022-03-23: qty 2

## 2022-03-23 MED ORDER — LIDOCAINE HCL (PF) 1 % IJ SOLN
INTRAMUSCULAR | Status: AC | PRN
  Administered 2022-03-23: 10 mL

## 2022-03-23 MED ORDER — LIDOCAINE HCL 1 % IJ SOLN
INTRAMUSCULAR | Status: AC
  Filled 2022-03-23: qty 20

## 2022-03-23 MED ORDER — LABETALOL HCL 5 MG/ML IV SOLN
INTRAVENOUS | Status: AC
  Filled 2022-03-23: qty 4

## 2022-03-23 NOTE — Procedures (Signed)
INTERVENTIONAL NEURORADIOLOGY BRIEF POSTPROCEDURE NOTE  DIAGNOSTIC CEREBRAL ANGIOGRAM  Attending: Dr. Pedro Earls  Diagnosis: Right MCA aneurysm  Access site: RCFA  Access closure: 37F angioseal  Anesthesia: Moderate sedation + local  Medication used: 1.5 mg Versed IV; 100 mcg Fentanyl IV; labetalol 10 mg IV; zofran 4 mg IV.Marland Kitchen  Complications: None.  Estimated blood loss: Negligible.  Specimen: None.  Findings: There is a fusiforme aneurysm at the right MCA bifurcation with maximum diameter around 5.3 mm. The aneurysm involves both the superior and the dominant inferior division branches. No other aneurysms identified.  The patient tolerated the procedure well without incident or complication and is in stable condition. She will return for office appointment to discuss management options.

## 2022-03-23 NOTE — Progress Notes (Signed)
Patient and friend was given discharge instructions. Both verbalized understanding. 

## 2022-03-24 ENCOUNTER — Other Ambulatory Visit (HOSPITAL_COMMUNITY): Payer: Self-pay | Admitting: Neuroradiology

## 2022-03-24 ENCOUNTER — Telehealth (HOSPITAL_COMMUNITY): Payer: Self-pay

## 2022-03-24 DIAGNOSIS — I671 Cerebral aneurysm, nonruptured: Secondary | ICD-10-CM

## 2022-03-24 NOTE — Telephone Encounter (Signed)
Called to schedule consult, no answer, left vm. AW  

## 2022-03-26 ENCOUNTER — Ambulatory Visit (HOSPITAL_COMMUNITY)
Admission: RE | Admit: 2022-03-26 | Discharge: 2022-03-26 | Disposition: A | Discharge status: Home / Self Care | Payer: Medicare HMO | Source: Ambulatory Visit | Attending: Neuroradiology | Admitting: Neuroradiology

## 2022-03-26 DIAGNOSIS — I671 Cerebral aneurysm, nonruptured: Secondary | ICD-10-CM

## 2022-03-26 NOTE — Progress Notes (Signed)
Referring Physician(s): de Macedo Kenton  Chief Complaint: The patient is seen in follow up today s/p diagnostic cerebral angiogram.  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  She underwent a diagnostic cerebral angiogram  on 03/23/2022 to better evaluate aneurysm findings. She has no specific complaints post angiography.   Past Medical History:  Diagnosis Date   Allergy    seasonal allergies   Anxiety    on meds   Arthritis    low back issues   Carpal tunnel syndrome    LEFT   Cataract    bilateral -sx   Cervical pain (neck)    Complication of anesthesia    DIFFICULTY WAKING UP   Coronary artery disease    Depression    on meds   GERD (gastroesophageal reflux disease)    on meds   Hyperlipidemia    diet controlled- not on meds at this time (08/05/2020)   Insomnia    Mass of right lower leg    Morbid obesity (HCC)    OSA on CPAP    uses CPAP nightly   Osteopenia    neck   PONV (postoperative nausea and vomiting)    Post-polio syndrome    With left leg paralysis   S/P TAVR (transcatheter aortic valve replacement)    Sigmoid diverticulosis    Ulnar nerve abnormality     Past Surgical History:  Procedure Laterality Date   AMPUTATION TOE Left 02/18/2022   Procedure: Second and Third distal interphalangeal and Fourth proximal interphalangeal  toe amputations;  Surgeon: Wylene Simmer, MD;  Location: [facility];  Service: Orthopedics;  Laterality: Left;   ANKLE FUSION      left   APPENDECTOMY     BIOPSY  05/04/2021   Procedure: BIOPSY;  Surgeon: Rush Landmark Telford Nab., MD;  Location: [facility];  Service: Gastroenterology;;   CHOLECYSTECTOMY     COLONOSCOPY     COLONOSCOPY N/A 05/04/2021   Procedure: COLONOSCOPY;  Surgeon: Irving Copas., MD;  Location: [facility];  Service: Gastroenterology;  Laterality: N/A;   ESOPHAGOGASTRODUODENOSCOPY (EGD) WITH PROPOFOL N/A 05/04/2021   Procedure: ESOPHAGOGASTRODUODENOSCOPY (EGD) WITH PROPOFOL;  Surgeon: Rush Landmark Telford Nab., MD;  Location: [facility];  Service: Gastroenterology;  Laterality: N/A;   EYE SURGERY Bilateral 2020   cataracts removed August and September 2020   IR 3D INDEPENDENT WKST  03/23/2022   IR ANGIO INTRA EXTRACRAN SEL INTERNAL CAROTID BILAT MOD SED  03/23/2022   IR ANGIO VERTEBRAL SEL VERTEBRAL UNI L MOD SED  03/23/2022   IR US GUIDE VASC ACCESS RIGHT  03/23/2022   JOINT REPLACEMENT Right 2003   three replacements; 2003 is last one   KNEE ARTHROPLASTY     LEG SURGERY     muscle surgery related to polio   MASS EXCISION Right 01/08/2020   Procedure: EXCISION RIGHT LEG MASS;  Surgeon: Erroll Luna, MD;  Location: [facility];  Service: General;  Laterality: Right;   POLYPECTOMY  05/04/2021   Procedure: POLYPECTOMY;  Surgeon: Irving Copas., MD;  Location: [facility];  Service: Gastroenterology;;   RECTAL PROLAPSE REPAIR  2020   pessary placed   RIGHT/LEFT HEART CATH AND CORONARY ANGIOGRAPHY N/A 04/02/2019   Procedure: RIGHT/LEFT HEART CATH AND CORONARY ANGIOGRAPHY;  Surgeon: Nelva Bush, MD;  Location: [facility];  Service: Cardiovascular;  Laterality: N/A;   SAVORY DILATION N/A 05/04/2021   Procedure: SAVORY DILATION;  Surgeon: Rush Landmark Telford Nab., MD;  Location: [facility];  Service: Gastroenterology;  Laterality: N/A;   TEE WITHOUT CARDIOVERSION N/A 04/17/2019   Procedure: TRANSESOPHAGEAL ECHOCARDIOGRAM (TEE);  Surgeon: Sherren Mocha, MD;  Location: [facility];  Service: Open Heart Surgery;  Laterality: N/A;   TOTAL HIP ARTHROPLASTY     right x 3   TRANSCATHETER AORTIC VALVE REPLACEMENT, TRANSFEMORAL N/A 04/17/2019   Procedure: TRANSCATHETER AORTIC VALVE REPLACEMENT, TRANSFEMORAL;  Surgeon: Sherren Mocha, MD;  Location: [facility];  Service: Open Heart Surgery;  Laterality: N/A;   UMBILICAL HERNIA REPAIR      Allergies: Amoxicillin, Flagyl [metronidazole], Plaquenil [hydroxychloroquine sulfate], Acetaminophen-codeine, Codeine, Doxycycline, Percocet [oxycodone-acetaminophen], and Sulfa antibiotics  Medications: Prior to Admission medications   Medication Sig Start Date End Date Taking? Authorizing Provider  acetaminophen (TYLENOL) 500 MG tablet Take 1,000 mg by mouth every 6 (six) hours as needed for moderate pain or headache.    [provider]  ALPRAZolam Duanne Moron) 0.5 MG tablet Take 0.5 mg by mouth 2 (two) times daily as needed for sleep.     [provider]  Aspirin 81 MG CAPS Take 1 mg by mouth daily.    [provider]  cetirizine (ZYRTEC) 10 MG tablet Take 10 mg by mouth as needed for allergies.    [provider]  Cholecalciferol 50 MCG (2000 UT) TABS Take 2,000 Units by mouth daily.     [provider]  Docusate Sodium (COLACE PO) Take 2 capsules by mouth daily.    [provider]  docusate sodium (COLACE) 100 MG capsule Take 1 capsule (100 mg total) by mouth 2 (two) times daily. While taking narcotic pain medicine. Patient not taking: Reported on 03/18/2022 02/18/22   Corky Sing, PA-C  fluticasone [facility]) 50 MCG/ACT nasal spray Place 1 spray into both nostrils as needed for allergies or rhinitis.    [provider]  gabapentin (NEURONTIN) 300 MG capsule Take 300 mg by mouth daily.    [provider]  ibuprofen (ADVIL) 200 MG tablet Take 200 mg by mouth every 6 (six) hours as needed for moderate pain.     [provider]  pantoprazole (PROTONIX) 40 MG tablet Take 40 mg by mouth daily.    [provider]  Polyethyl Glycol-Propyl Glycol (SYSTANE OP) Place 1-2 drops into both eyes as needed (dry eyes).    [provider]  rosuvastatin (CRESTOR) 20 MG tablet Take 1 tablet (20 mg total) by mouth daily. Patient taking differently: Take 20 mg by mouth at bedtime. 03/04/22   Elgergawy, Silver Huguenin, MD  traZODone (DESYREL) 100 MG tablet Take 100 mg by mouth at bedtime as needed for sleep.    [provider]  venlafaxine XR (EFFEXOR-XR) 75 MG 24 hr capsule Take 75 mg by mouth daily with breakfast.    [provider]     Family History  Problem Relation Age of Onset   Ovarian cancer Mother 50   Colon cancer Mother 87       mets from ovarian CA   Heart disease Father    CAD  Other        FAM Hx OF CAD   Diabetes Other        FAM Hx of DM   Hypertension Other    Esophageal cancer Neg Hx    Rectal cancer Neg Hx    Stomach cancer Neg Hx    Inflammatory bowel disease Neg Hx    Liver disease Neg Hx    Pancreatic cancer Neg Hx     Social History   Socioeconomic History   Marital status: Single    Spouse name: Not on file   Number of children: Not on file   Years of education: Not on file   Highest education level: Not on file  Occupational History   Not on file  Tobacco Use   Smoking status: Never   Smokeless tobacco: Never  Vaping Use   Vaping Use: Never used  Substance and Sexual Activity   Alcohol use: No   Drug use: Never   Sexual activity: Not on file  Other Topics Concern   Not on file  Social History Narrative   Lives alone.   Social Determinants of Health   Financial Resource Strain: Not on file  Food Insecurity: Not on file  Transportation Needs: Not on file  Physical Activity: Not on file  Stress: Not on file  Social Connections: Not on file     Vital Signs: There were no vitals taken for this visit.  Physical Exam No  interval change.  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 She comes today for a diagnostic cerebral angiogram to better evaluate CTA findings. EXAM: ULTRASOUND-GUIDED VASCULAR ACCESS DIAGNOSTIC CEREBRAL ANGIOGRAM 3D ROTATIONAL ANGIOGRAM COMPARISON:  CT/CT angiogram of the head and neck March 03, 2022. MEDICATIONS: 10 mg of labetalol IV; 4 mg of Zofran IV. ANESTHESIA/SEDATION: Moderate (conscious) sedation was employed during this procedure. A total of Versed 1.5 mg and Fentanyl 100 mcg was administered intravenously by the radiology nurse. Total intra-service moderate Sedation Time: 57 minutes. The patient's level of consciousness and vital signs were monitored continuously by radiology nursing throughout the procedure under my direct supervision. CONTRAST:  85 mL of Omnipaque 300 milligram/mL FLUOROSCOPY: Radiation Exposure Index (as provided by the fluoroscopic device): 161 mGy Kerma COMPLICATIONS: None immediate. TECHNIQUE: Informed written consent was obtained from the patient after a thorough discussion of the procedural risks, benefits and alternatives. All questions were addressed. Maximal Sterile Barrier Technique was utilized including caps, mask, sterile gowns, sterile gloves, sterile drape, hand hygiene and skin antiseptic. A timeout was performed prior to the initiation of the procedure. The right groin was prepped and draped in the usual sterile fashion. Using a  micropuncture kit and the modified Seldinger technique, access was gained to the right common femoral artery and a 5 French sheath was placed. Real-time ultrasound guidance was utilized for vascular access including the acquisition of a permanent ultrasound image documenting patency of the accessed vessel. Under fluoroscopy, a 5 [place] Berenstein 2 catheter was navigated over a  0.035" Terumo Glidewire into the aortic arch. The catheter was placed into the right common carotid artery. Frontal and lateral angiograms of the neck were obtained. Using biplane roadmap guidance, the catheter was advanced into the right internal carotid artery. Frontal and lateral angiograms of the head were obtained. 3D rotational angiograms were acquired and post processed in a separate workstation under concurrent attending physician supervision. Selected images were sent to PACS. Then, a magnified oblique view of the head were obtained centered on the right MCA. Next, the catheter was placed into the left subclavian artery. Using roadmap guidance, the catheter was advanced into the right vertebral artery. Frontal and lateral angiograms of the head were obtained. Then, the catheter was placed into the right common carotid artery. Frontal and lateral angiograms of the neck were obtained. Using biplane roadmap guidance, the catheter was placed into the left internal carotid artery. Frontal, lateral and bilateral magnified oblique views of the head were obtained. The catheter was subsequently withdrawn. Right common femoral artery angiogram was obtained in right anterior oblique view. The puncture is at the level of the common femoral artery. The artery has normal caliber, adequate for closure device. The sheath was exchanged over the wire for a 6 [place] Angio-Seal which was utilized for access closure. Immediate hemostasis was achieved. FINDINGS: Right CCA angiograms: Cervical angiograms show severe tortuosity of the right common carotid  artery. The right carotid bifurcation is maintained. Right ICA angiograms: There is brisk vascular contrast filling of the right ACA and MCA vascular trees. A fusiform dilatation is seen at the early right M1/MCA trifurcation involving the origin of a small anterior division, anterior temporal artery and the dominant posterior division. The dilatation has a maximum diameter of approximately 5.3 mm. No abnormally high-flow, early draining veins are seen. No regions of abnormal hypervascularity are noted. The visualized dural sinuses are patent. Left vertebral artery angiograms: The left vertebral artery, basilar artery, and bilateral posterior cerebral arteries are unremarkable. The right V3 and V4 segments of the right vertebral artery and the right PICA are seen via contrast reflux in appear normal. Luminal caliber is smooth and tapering. No aneurysms or abnormally high-flow, early draining veins are seen. No regions of abnormal hypervascularity are noted. The visualized dural sinuses are patent. Left CCA angiograms: Increased tortuosity of the left common carotid artery. The left carotid bifurcation is maintained. Left ICA angiograms: There is brisk vascular contrast filling of the left ACA and MCA vascular trees. Luminal caliber is smooth and tapering. No aneurysms or abnormally high-flow, early draining veins are seen. No regions of abnormal hypervascularity are noted. The visualized dural sinuses are patent. Right common femoral artery angiograms: The access is at the level of the mid right common femoral artery. The femoral artery has normal caliber, adequate for closure device. PROCEDURE: No intervention performed. 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 Otherwise, unremarkable cerebral angiogram. PLAN: We will schedule a follow-up appointment to discuss angiogram findings and management options. Electronically Signed   By:  Pedro Earls M.D.   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 She comes today for a diagnostic cerebral angiogram to better evaluate CTA findings. EXAM: ULTRASOUND-GUIDED VASCULAR ACCESS DIAGNOSTIC CEREBRAL ANGIOGRAM 3D ROTATIONAL ANGIOGRAM COMPARISON:  CT/CT angiogram of the head and neck March 03, 2022. MEDICATIONS: 10 mg of labetalol IV; 4 mg of Zofran IV. ANESTHESIA/SEDATION: Moderate (conscious) sedation was employed during this procedure. A total of Versed 1.5 mg and Fentanyl 100 mcg was administered intravenously by the radiology nurse. Total intra-service moderate Sedation Time: 57 minutes. The patient's level of consciousness and vital signs were monitored continuously by radiology nursing throughout the procedure under my direct supervision. CONTRAST:  85 mL of Omnipaque 300 milligram/mL FLUOROSCOPY: Radiation Exposure Index (as provided by the fluoroscopic device): 826 mGy Kerma COMPLICATIONS: None immediate. TECHNIQUE: Informed written consent was obtained from the patient after a thorough discussion of the procedural risks, benefits and alternatives. All questions were addressed. Maximal Sterile Barrier Technique was utilized including caps, mask, sterile gowns, sterile gloves, sterile drape, hand hygiene and skin antiseptic. A timeout was performed prior to the initiation of the procedure. The right groin was prepped and draped in the usual sterile  fashion. Using a micropuncture kit and the modified Seldinger technique, access was gained to the right common femoral artery and a 5 French sheath was placed. Real-time ultrasound guidance was utilized for vascular access including the acquisition of a permanent ultrasound image documenting patency of the accessed vessel. Under fluoroscopy, a 5 [place] Berenstein 2 catheter was navigated over a 0.035" Terumo Glidewire into the aortic arch. The catheter was placed into the right common carotid artery. Frontal and lateral angiograms of the neck were obtained. Using biplane roadmap guidance, the catheter was advanced into the right internal carotid artery. Frontal and lateral angiograms of the head were obtained. 3D rotational angiograms were acquired and post processed in a separate workstation under concurrent attending physician supervision. Selected images were sent to PACS. Then, a magnified oblique view of the head were obtained centered on the right MCA. Next, the catheter was placed into the left subclavian artery. Using roadmap guidance, the catheter was advanced into the right vertebral artery. Frontal and lateral angiograms of the head were obtained. Then, the catheter was placed into the right common carotid artery. Frontal and lateral angiograms of the neck were obtained. Using biplane roadmap guidance, the catheter was placed into the left internal carotid artery. Frontal, lateral and bilateral magnified oblique views of the head were obtained. The catheter was subsequently withdrawn. Right common femoral artery angiogram was obtained in right anterior oblique view. The puncture is at the level of the common femoral artery. The artery has normal caliber, adequate for closure device. The sheath was exchanged over the wire for a 6 [place] Angio-Seal which was utilized for access closure. Immediate hemostasis was achieved. FINDINGS: Right CCA angiograms: Cervical angiograms show severe tortuosity of the right  common carotid artery. The right carotid bifurcation is maintained. Right ICA angiograms: There is brisk vascular contrast filling of the right ACA and MCA vascular trees. A fusiform dilatation is seen at the early right M1/MCA trifurcation involving the origin of a small anterior division, anterior temporal artery and the dominant posterior division. The dilatation has a maximum diameter of approximately 5.3 mm. No abnormally high-flow, early draining veins are seen. No regions of abnormal hypervascularity are noted. The visualized dural sinuses are patent. Left  vertebral artery angiograms: The left vertebral artery, basilar artery, and bilateral posterior cerebral arteries are unremarkable. The right V3 and V4 segments of the right vertebral artery and the right PICA are seen via contrast reflux in appear normal. Luminal caliber is smooth and tapering. No aneurysms or abnormally high-flow, early draining veins are seen. No regions of abnormal hypervascularity are noted. The visualized dural sinuses are patent. Left CCA angiograms: Increased tortuosity of the left common carotid artery. The left carotid bifurcation is maintained. Left ICA angiograms: There is brisk vascular contrast filling of the left ACA and MCA vascular trees. Luminal caliber is smooth and tapering. No aneurysms or abnormally high-flow, early draining veins are seen. No regions of abnormal hypervascularity are noted. The visualized dural sinuses are patent. Right common femoral artery angiograms: The access is at the level of the mid right common femoral artery. The femoral artery has normal caliber, adequate for closure device. PROCEDURE: No intervention performed. 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 Otherwise, unremarkable cerebral angiogram. PLAN: We will schedule a follow-up appointment to discuss angiogram findings and management options. Electronically  Signed   By: Pedro Earls M.D.   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 She comes today for a diagnostic cerebral angiogram to better evaluate CTA findings. EXAM: ULTRASOUND-GUIDED VASCULAR ACCESS DIAGNOSTIC CEREBRAL ANGIOGRAM 3D ROTATIONAL ANGIOGRAM COMPARISON:  CT/CT angiogram of the head and neck March 03, 2022. MEDICATIONS: 10 mg of labetalol IV; 4 mg of Zofran IV. ANESTHESIA/SEDATION: Moderate (conscious) sedation was employed during this procedure. A total of Versed 1.5 mg and Fentanyl 100 mcg was administered intravenously by the radiology nurse. Total intra-service moderate Sedation Time: 57 minutes. The patient's level of consciousness and vital signs were monitored continuously by radiology nursing throughout the procedure under my direct supervision. CONTRAST:  85 mL of Omnipaque 300 milligram/mL FLUOROSCOPY: Radiation Exposure Index (as provided by the fluoroscopic device): 332 mGy Kerma COMPLICATIONS: None immediate. TECHNIQUE: Informed written consent was obtained from the patient after a thorough discussion of the procedural risks, benefits and alternatives. All questions were addressed. Maximal Sterile Barrier Technique was utilized including caps, mask, sterile gowns, sterile gloves, sterile drape, hand hygiene and skin antiseptic. A timeout was performed prior to the initiation of the procedure. The right groin was prepped and draped in the usual  sterile fashion. Using a micropuncture kit and the modified Seldinger technique, access was gained to the right common femoral artery and a 5 French sheath was placed. Real-time ultrasound guidance was utilized for vascular access including the acquisition of a permanent ultrasound image documenting patency of the accessed vessel. Under fluoroscopy, a 5 [place] Berenstein 2 catheter was navigated over a 0.035" Terumo Glidewire into the aortic arch. The catheter was placed into the right common carotid artery. Frontal and lateral angiograms of the neck were obtained. Using biplane roadmap guidance, the catheter was advanced into the right internal carotid artery. Frontal and lateral angiograms  of the head were obtained. 3D rotational angiograms were acquired and post processed in a separate workstation under concurrent attending physician supervision. Selected images were sent to PACS. Then, a magnified oblique view of the head were obtained centered on the right MCA. Next, the catheter was placed into the left subclavian artery. Using roadmap guidance, the catheter was advanced into the right vertebral artery. Frontal and lateral angiograms of the head were obtained. Then, the catheter was placed into the right common carotid artery. Frontal and lateral angiograms of the neck were obtained. Using biplane roadmap guidance, the catheter was placed into the left internal carotid artery. Frontal, lateral and bilateral magnified oblique views of the head were obtained. The catheter was subsequently withdrawn. Right common femoral artery angiogram was obtained in right anterior oblique view. The puncture is at the level of the common femoral artery. The artery has normal caliber, adequate for closure device. The sheath was exchanged over the wire for a 6 [place] Angio-Seal which was utilized for access closure. Immediate hemostasis was achieved. FINDINGS: Right CCA angiograms: Cervical angiograms show severe tortuosity of  the right common carotid artery. The right carotid bifurcation is maintained. Right ICA angiograms: There is brisk vascular contrast filling of the right ACA and MCA vascular trees. A fusiform dilatation is seen at the early right M1/MCA trifurcation involving the origin of a small anterior division, anterior temporal artery and the dominant posterior division. The dilatation has a maximum diameter of approximately 5.3 mm. No abnormally high-flow, early draining veins are seen. No regions of abnormal hypervascularity are noted. The visualized dural sinuses are patent. Left vertebral artery angiograms: The left vertebral artery, basilar artery, and bilateral posterior cerebral arteries are unremarkable. The right V3 and V4 segments of the right vertebral artery and the right PICA are seen via contrast reflux in appear normal. Luminal caliber is smooth and tapering. No aneurysms or abnormally high-flow, early draining veins are seen. No regions of abnormal hypervascularity are noted. The visualized dural sinuses are patent. Left CCA angiograms: Increased tortuosity of the left common carotid artery. The left carotid bifurcation is maintained. Left ICA angiograms: There is brisk vascular contrast filling of the left ACA and MCA vascular trees. Luminal caliber is smooth and tapering. No aneurysms or abnormally high-flow, early draining veins are seen. No regions of abnormal hypervascularity are noted. The visualized dural sinuses are patent. Right common femoral artery angiograms: The access is at the level of the mid right common femoral artery. The femoral artery has normal caliber, adequate for closure device. PROCEDURE: No intervention performed. 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 Otherwise, unremarkable cerebral angiogram. PLAN: We will schedule a follow-up appointment to discuss angiogram findings and management options.  Electronically Signed   By: Pedro Earls M.D.   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 She comes today for a diagnostic cerebral angiogram to better evaluate CTA  findings. EXAM: ULTRASOUND-GUIDED VASCULAR ACCESS DIAGNOSTIC CEREBRAL ANGIOGRAM 3D ROTATIONAL ANGIOGRAM COMPARISON:  CT/CT angiogram of the head and neck March 03, 2022. MEDICATIONS: 10 mg of labetalol IV; 4 mg of Zofran IV. ANESTHESIA/SEDATION: Moderate (conscious) sedation was employed during this procedure. A total of Versed 1.5 mg and Fentanyl 100 mcg was administered intravenously by the radiology nurse. Total intra-service moderate Sedation Time: 57 minutes. The patient's level of consciousness and vital signs were monitored continuously by radiology nursing throughout the procedure under my direct supervision. CONTRAST:  85 mL of Omnipaque 300 milligram/mL FLUOROSCOPY: Radiation Exposure Index (as provided by the fluoroscopic device): 793 mGy Kerma COMPLICATIONS: None immediate. TECHNIQUE: Informed written consent was obtained from the patient after a thorough discussion of the procedural risks, benefits and alternatives. All questions were addressed. Maximal Sterile Barrier Technique was utilized including caps, mask, sterile gowns, sterile gloves, sterile drape, hand hygiene and skin antiseptic. A timeout was performed prior to the initiation of the procedure. The  right groin was prepped and draped in the usual sterile fashion. Using a micropuncture kit and the modified Seldinger technique, access was gained to the right common femoral artery and a 5 French sheath was placed. Real-time ultrasound guidance was utilized for vascular access including the acquisition of a permanent ultrasound image documenting patency of the accessed vessel. Under fluoroscopy, a 5 [place] Berenstein 2 catheter was navigated over a 0.035" Terumo Glidewire into the aortic arch. The catheter was placed into the right common carotid artery. Frontal and lateral angiograms of the neck were obtained. Using biplane roadmap guidance, the catheter was advanced into the right internal carotid artery. Frontal and lateral angiograms of the head were obtained. 3D rotational angiograms were acquired and post processed in a separate workstation under concurrent attending physician supervision. Selected images were sent to PACS. Then, a magnified oblique view of the head were obtained centered on the right MCA. Next, the catheter was placed into the left subclavian artery. Using roadmap guidance, the catheter was advanced into the right vertebral artery. Frontal and lateral angiograms of the head were obtained. Then, the catheter was placed into the right common carotid artery. Frontal and lateral angiograms of the neck were obtained. Using biplane roadmap guidance, the catheter was placed into the left internal carotid artery. Frontal, lateral and bilateral magnified oblique views of the head were obtained. The catheter was subsequently withdrawn. Right common femoral artery angiogram was obtained in right anterior oblique view. The puncture is at the level of the common femoral artery. The artery has normal caliber, adequate for closure device. The sheath was exchanged over the wire for a 6 [place] Angio-Seal which was utilized for access closure. Immediate hemostasis was achieved. FINDINGS: Right CCA angiograms:  Cervical angiograms show severe tortuosity of the right common carotid artery. The right carotid bifurcation is maintained. Right ICA angiograms: There is brisk vascular contrast filling of the right ACA and MCA vascular trees. A fusiform dilatation is seen at the early right M1/MCA trifurcation involving the origin of a small anterior division, anterior temporal artery and the dominant posterior division. The dilatation has a maximum diameter of approximately 5.3 mm. No abnormally high-flow, early draining veins are seen. No regions of abnormal hypervascularity are noted. The visualized dural sinuses are patent. Left vertebral artery angiograms: The left vertebral artery, basilar artery, and bilateral posterior cerebral arteries are unremarkable. The right V3 and V4 segments of the right vertebral artery and the right PICA are seen via contrast reflux in appear normal. Luminal  caliber is smooth and tapering. No aneurysms or abnormally high-flow, early draining veins are seen. No regions of abnormal hypervascularity are noted. The visualized dural sinuses are patent. Left CCA angiograms: Increased tortuosity of the left common carotid artery. The left carotid bifurcation is maintained. Left ICA angiograms: There is brisk vascular contrast filling of the left ACA and MCA vascular trees. Luminal caliber is smooth and tapering. No aneurysms or abnormally high-flow, early draining veins are seen. No regions of abnormal hypervascularity are noted. The visualized dural sinuses are patent. Right common femoral artery angiograms: The access is at the level of the mid right common femoral artery. The femoral artery has normal caliber, adequate for closure device. PROCEDURE: No intervention performed. 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 Otherwise, unremarkable cerebral angiogram. PLAN: We will schedule a follow-up appointment to discuss  angiogram findings and management options. Electronically Signed   By: Pedro Earls M.D.   On: 03/23/2022 14:46    Labs:  CBC: Recent Labs    12/23/21 1201 03/03/22 2115 03/05/22 0213 03/23/22 0910  WBC 7.3 9.3 9.1 6.8  HGB 14.5 13.5 13.4 15.0  HCT 43.0 39.4 38.8 43.7  PLT 272.0 265 268 227    COAGS: Recent Labs    03/27/21 1647 03/03/22 2315 03/23/22 0910  INR 1.1* 1.1 1.1  APTT  --  28  --     BMP: Recent Labs    03/27/21 1647 03/03/22 2115 03/05/22 0213 03/23/22 0910  NA 136 137 139 137  K 4.0 4.3 3.9 3.8  CL 103 103 107 105  CO2 $Re'24 25 24 23  'Yaz$ GLUCOSE 103* 122* 149* 127*  BUN $Re'14 10 13 13  'oeg$ CALCIUM 9.4 8.8* 8.7* 9.1  CREATININE 0.68 0.74 0.75 0.59  GFRNONAA  --  >60 >60 >60    LIVER FUNCTION TESTS: Recent Labs    03/03/22 2115  BILITOT 0.3  AST 20  ALT 12  ALKPHOS 88  PROT 6.7  ALBUMIN 3.7    Assessment:  I reviewed the angiogram images with [name]. I explained to her that this is a fusiform aneurysm involving the right middle cerebral artery branches rather than a saccular aneurysm and, therefore, would require a flow diverter placement covering M2 branches. Given the low rupture risk of such aneurysm, I do not believe that the risks related to the procedure are justified and thus, treatment is not recommended. We will, however, continue to follow her up to monitor for aneurysm growth and/or change in morphology and well as for new aneurysm screening. Initially, we will obtain a CTA of the head in 6 month to exclude any rapid growth. She understood and agreed with the plan.  Signed: Pedro Earls, MD 03/26/2022, 4:44 PM    I spent a total of     20 minutes  in face to face in clinical consultation, greater than 50% of which was counseling/coordinating care for cerebral angiogram.

## 2022-03-31 DIAGNOSIS — M47816 Spondylosis without myelopathy or radiculopathy, lumbar region: Secondary | ICD-10-CM | POA: Diagnosis not present

## 2022-04-01 NOTE — Progress Notes (Signed)
[name] [address] [place]. [place] [address] [phone]       HOSPITAL FOLLOW UP NOTE  [name] Date of Birth:  May 28, 1956 Medical Record Number:  [id number]   Reason for Referral:  hospital stroke follow up    SUBJECTIVE:   CHIEF COMPLAINT:  Chief Complaint  Patient presents with   Hospitalization Follow-up    Pt reports feeling okay. Room 2 alone    HPI:   [name] is a 66 y.o. 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  She was seen by tele neurology and transferred to [facility] ED for further evaluation. 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  MRI showed no acute infarct.  LDL 44, A1c 6.2, UDS negative.  ESR 30, CRP 1.4.  However, 2D echo showed EF 65 to 70%, AV mean gradient increased from 9-14.5 and recommended TEE or cardiac CTA to exclude leaflet thrombosis although cardiology did not feel this was warranted as prior TAVR functioning normally and recommended outpatient follow-up.  Etiology for headache and numbness likely complicated migraine less likely for TIA although patient does have several risk factors.  Recommended continuation of home aspirin and Crestor for stroke prevention.  Also advised outpatient follow-up with Dr. Norma Gonzales for right MCA aneurysm.   Today, 04/05/2022, patient is being seen for initial hospital follow-up unaccompanied.  She has been doing well since discharge without any new or reoccurring neurological symptoms. Was having issues with residual headaches up until about 2 days ago, believes they were mostly related to stress. She does have chronic occasional right temporal headaches, occasionally affecting left temporal, has been  ongoing for years. Will take Tylenol with resolution of headache.  She has remained on aspirin and Crestor.  Blood pressure initially elevated but on recheck 133/93.  Does not routinely monitor at home.  Has since had follow-up visit with IR Dr. Norma Gonzales for cerebral aneurysm, underwent cerebral arteriogram 10/10, treatment not recommended as believes risk would outweigh benefit and recommended continued monitoring with plans on repeat CTA head in 6 months.  She has multiple questions regarding this.        PERTINENT IMAGING  CT head no acute abnormality CTA head/neck no acute finding except 5 to 6 mm right MCA aneurysm MRI no acute infarct 2D echo EF 65 to 70%,  AV mean gradient increased from 9-14.5       ROS:   14 system review of systems performed and negative with exception of those listed in HPI  PMH:  Past Medical History:  Diagnosis Date   Allergy    seasonal allergies   Anxiety    on meds   Arthritis    low back issues   Carpal tunnel syndrome    LEFT   Cataract    bilateral -sx   Cervical pain (neck)    Complication of anesthesia    DIFFICULTY WAKING UP   Coronary artery disease    Depression    on meds   GERD (gastroesophageal reflux disease)    on meds   Hyperlipidemia    diet controlled- not on meds at this time (08/05/2020)   Insomnia    Mass of right lower leg  Morbid obesity (HCC)    OSA on CPAP    uses CPAP nightly   Osteopenia    neck   PONV (postoperative nausea and vomiting)    Post-polio syndrome    With left leg paralysis   S/P TAVR (transcatheter aortic valve replacement)    Sigmoid diverticulosis    Ulnar nerve abnormality     PSH:  Past Surgical History:  Procedure Laterality Date   AMPUTATION TOE Left 02/18/2022   Procedure: Second and Third distal interphalangeal and Fourth proximal interphalangeal  toe amputations;  Surgeon: Debra Arthurs, MD;  Location: [facility];  Service: Orthopedics;  Laterality: Left;    ANKLE FUSION     left   APPENDECTOMY     BIOPSY  05/04/2021   Procedure: BIOPSY;  Surgeon: Debra Score Netty Starring., MD;  Location: [facility];  Service: Gastroenterology;;   CHOLECYSTECTOMY     COLONOSCOPY     COLONOSCOPY N/A 05/04/2021   Procedure: COLONOSCOPY;  Surgeon: Debra Lofty., MD;  Location: [facility];  Service: Gastroenterology;  Laterality: N/A;   ESOPHAGOGASTRODUODENOSCOPY (EGD) WITH PROPOFOL N/A 05/04/2021   Procedure: ESOPHAGOGASTRODUODENOSCOPY (EGD) WITH PROPOFOL;  Surgeon: Debra Score Netty Starring., MD;  Location: [facility];  Service: Gastroenterology;  Laterality: N/A;   EYE SURGERY Bilateral 2020   cataracts removed August and September 2020   IR 3D INDEPENDENT WKST  03/23/2022   IR ANGIO INTRA EXTRACRAN SEL INTERNAL CAROTID BILAT MOD SED  03/23/2022   IR ANGIO VERTEBRAL SEL VERTEBRAL UNI L MOD SED  03/23/2022   IR US GUIDE VASC ACCESS RIGHT  03/23/2022   JOINT REPLACEMENT Right 2003   three replacements; 2003 is last one   KNEE ARTHROPLASTY     LEG SURGERY     muscle surgery related to polio   MASS EXCISION Right 01/08/2020   Procedure: EXCISION RIGHT LEG MASS;  Surgeon: Debra Bouillon, MD;  Location: [facility];  Service: General;  Laterality: Right;   POLYPECTOMY  05/04/2021   Procedure: POLYPECTOMY;  Surgeon: Debra Lofty., MD;  Location: [facility];  Service: Gastroenterology;;   RECTAL PROLAPSE REPAIR  2020   pessary placed   RIGHT/LEFT HEART CATH AND CORONARY ANGIOGRAPHY N/A 04/02/2019   Procedure: RIGHT/LEFT HEART CATH AND CORONARY ANGIOGRAPHY;  Surgeon: Debra Kendall, MD;  Location: [facility];  Service: Cardiovascular;  Laterality: N/A;   SAVORY DILATION N/A 05/04/2021   Procedure: SAVORY DILATION;  Surgeon: Debra Score Netty Starring., MD;  Location: [facility];  Service: Gastroenterology;  Laterality: N/A;   TEE WITHOUT CARDIOVERSION N/A 04/17/2019   Procedure: TRANSESOPHAGEAL ECHOCARDIOGRAM (TEE);   Surgeon: Debra Bollman, MD;  Location: [facility];  Service: Open Heart Surgery;  Laterality: N/A;   TOTAL HIP ARTHROPLASTY     right x 3   TRANSCATHETER AORTIC VALVE REPLACEMENT, TRANSFEMORAL N/A 04/17/2019   Procedure: TRANSCATHETER AORTIC VALVE REPLACEMENT, TRANSFEMORAL;  Surgeon: Debra Bollman, MD;  Location: [facility] INVASIVE CV LAB;  Service: Open Heart Surgery;  Laterality: N/A;   UMBILICAL HERNIA REPAIR      Social History:  Social History   Socioeconomic History   Marital status: Single    Spouse name: Not on file   Number of children: Not on file   Years of education: Not on file   Highest education level: Not on file  Occupational History   Not on file  Tobacco Use   Smoking status: Never   Smokeless tobacco: Never  Vaping Use   Vaping Use: Never used  Substance and Sexual Activity   Alcohol use: No   Drug use: Never   Sexual activity: Not on file  Other Topics Concern   Not on file  Social History Narrative   Lives alone.   Social Determinants of Health   Financial Resource Strain: Not on file  Food Insecurity: Not on file  Transportation Needs: Not on file  Physical Activity: Not on file  Stress: Not on file  Social Connections: Not on file  Intimate Partner Violence: Not on file    Family History:  Family History  Problem Relation Age of Onset   Ovarian cancer Mother 86   Colon cancer Mother 57       mets from ovarian CA   Heart disease Father    CAD Other        FAM Hx OF CAD   Diabetes Other        FAM Hx of DM   Hypertension Other    Esophageal cancer Neg Hx    Rectal cancer Neg Hx    Stomach cancer Neg Hx    Inflammatory bowel disease Neg Hx    Liver disease Neg Hx    Pancreatic cancer Neg Hx     Medications:   Current Outpatient Medications on File Prior to Visit  Medication Sig Dispense Refill   acetaminophen (TYLENOL) 500 MG tablet Take 1,000 mg by mouth every 6 (six) hours as needed for moderate pain or headache.      ALPRAZolam (XANAX) 0.5 MG tablet Take 0.5 mg by mouth 2 (two) times daily as needed for sleep.      Aspirin 81 MG CAPS Take 1 mg by mouth daily.     cetirizine (ZYRTEC) 10 MG tablet Take 10 mg by mouth as needed for allergies.     Cholecalciferol 50 MCG (2000 UT) TABS Take 2,000 Units by mouth daily.      Docusate Sodium (COLACE PO) Take 2 capsules by mouth daily.     fluticasone (FLONASE) 50 MCG/ACT nasal spray Place 1 spray into both nostrils as needed for allergies or rhinitis.     gabapentin (NEURONTIN) 300 MG capsule Take 300 mg by mouth daily.     ibuprofen (ADVIL) 200 MG tablet Take 200 mg by mouth every 6 (six) hours as needed for moderate pain.     pantoprazole (PROTONIX) 40 MG tablet Take 40 mg by mouth daily.     Polyethyl Glycol-Propyl Glycol (SYSTANE OP) Place 1-2 drops into both eyes as needed (dry eyes).     rosuvastatin (CRESTOR) 20 MG tablet Take 1 tablet (20 mg total) by mouth daily. (Patient taking differently: Take 20 mg by mouth at bedtime.) 90 tablet 3   traZODone (DESYREL) 100 MG tablet Take 100 mg by mouth at bedtime as needed for sleep.     venlafaxine XR (EFFEXOR-XR) 75 MG 24 hr capsule Take 75 mg by mouth daily with breakfast.     docusate sodium (COLACE) 100 MG capsule Take 1 capsule (100 mg total) by mouth 2 (two) times daily. While taking narcotic pain medicine. (Patient not taking: Reported on 03/18/2022) 30 capsule 0   No current facility-administered medications on file prior to visit.    Allergies:   Allergies  Allergen Reactions   Amoxicillin Anaphylaxis and Swelling    Did it involve swelling of the face/tongue/throat, SOB, or low BP? Yes Did it involve sudden or severe rash/hives, skin peeling, or any reaction on the inside of your mouth or nose? No Did  you need to seek medical attention at a hospital or doctor's office? Yes When did it last happen?    yrs ago   If all above answers are "NO", may proceed with cephalosporin use.    Flagyl [Metronidazole]  Anaphylaxis   Plaquenil [Hydroxychloroquine Sulfate] Other (See Comments)    Blurred vision, joint pain   Acetaminophen-Codeine Nausea Only    Tylenol #3   Codeine Nausea Only   Doxycycline Nausea And Vomiting   Percocet [Oxycodone-Acetaminophen] Nausea Only   Sulfa Antibiotics Nausea Only      OBJECTIVE:  Physical Exam  Vitals:   04/05/22 0917 04/05/22 0947  BP: (!) 153/132 (!) 133/93  Pulse: 79    There is no height or weight on file to calculate BMI. No results found.     01/12/2018   10:08 AM  Depression screen PHQ 2/9  Decreased Interest 0  Down, Depressed, Hopeless 0  PHQ - 2 Score 0     General: well developed, well nourished, very pleasant middle-age Caucasian female, seated, in no evident distress Head: head normocephalic and atraumatic.   Neck: supple with no carotid or supraclavicular bruits Cardiovascular: regular rate and rhythm, no murmurs Musculoskeletal: LLE weakness chronic, right hip weakness chronic Skin:  no rash/petichiae Vascular:  Normal pulses all extremities   Neurologic Exam Mental Status: Awake and fully alert.  Fluent speech and language.  Oriented to place and time. Recent and remote memory intact. Attention span, concentration and fund of knowledge appropriate. Mood and affect appropriate.  Cranial Nerves: Fundoscopic exam reveals sharp disc margins. Pupils equal, briskly reactive to light. Extraocular movements full without nystagmus. Visual fields full to confrontation. Hearing intact. Facial sensation intact. Face, tongue, palate moves normally and symmetrically.  Motor: Normal bulk and tone. Normal strength in all tested extremity muscles except chronic RLE HF weakness with limited ROM and LLE weakness with limited movement Sensory.: intact to touch , pinprick , position and vibratory sensation.  Coordination: Rapid alternating movements normal in upper extremities. Finger-to-nose performed accurately bilaterally  Gait and Station:  Deferred Reflexes: 1+ and symmetric. Toes downgoing.        ASSESSMENT: [name] is a 66 y.o. year old female with likely complicated migraine after presenting with headache and right facial numbness on 03/03/2022, less likely concerning for TIA although patient does have several stroke risk factors. Vascular risk factors include HTN, HLD, CAD, cerebral aneurysm, AS s/p TCAR in 2020, OSA on CPAP, dCHF and morbid obesity.      PLAN:  Strokelike episode:  Complicated migraine: Headaches gradually improving without headache recurrence over the past 2 days.  Does have chronic history of headaches. Continue as needed use of Tylenol for occasional headaches. Advised patient if headaches should return, worsen, or become bothersome, to call office for further treatment plan but currently no indication for prophylactic management TIA:  Continue aspirin $RemoveBeforeDE'81mg'[id number]$  daily and rosuvastatin (Crestor) for secondary stroke prevention.   Discussed secondary stroke prevention measures and importance of close PCP follow up for aggressive stroke risk factor management including BP goal<130/90, HLD with LDL goal<70 and DM with A1c.<7 . Stroke labs 02/2022: LDL 44, A1c 6.2 I have gone over the pathophysiology of stroke, warning signs and symptoms, risk factors and their management in some detail with instructions to go to the closest emergency room for symptoms of concern. Cerebral aneurysm: CTA head/neck 5-6 right MCA aneurysm, evaluated by IR with completion of cerebral arteriogram, intervention not recommended and recommended continued monitoring.  Continue follow-up  with Dr. Norma Gonzales with plans on repeat CTA head in 6 months.  Multiple questions today regarding cerebral aneurysm answered to the best my ability but advised if any further questions should arise to contact IR for further assistance    Doing well from neurological standpoint without further recommendations and risk factors are managed by PCP.  She may follow up PRN, as usual for our patients who are strictly being followed for stroke. If any new neurological issues should arise, request PCP place referral for evaluation by one of our neurologists. Thank you.     CC:  [facility] provider: Dr. Leonie Man PCP: [name], MD    I spent 59 minutes of face-to-face and non-face-to-face time with patient.  This included previsit chart review including review of recent hospitalization, lab review, study review, order entry, electronic health record documentation, patient education regarding recent strokelike episode and likely etiology, stroke prevention measures and importance of managing stroke risk factors, cerebral aneurysm and answered all other questions to patient satisfaction  Frann Rider, AGNP-BC  [facility] [address] [place], [place] [address]  Phone [phone] Fax [phone] Note: This document was prepared with digital dictation and possible smart phrase technology. Any transcriptional errors that result from this process are unintentional.

## 2022-04-05 ENCOUNTER — Ambulatory Visit (INDEPENDENT_AMBULATORY_CARE_PROVIDER_SITE_OTHER): Payer: Medicare HMO | Admitting: Adult Health

## 2022-04-05 ENCOUNTER — Encounter: Payer: Self-pay | Admitting: Adult Health

## 2022-04-05 VITALS — BP 133/93 | HR 79

## 2022-04-05 DIAGNOSIS — M7918 Myalgia, other site: Secondary | ICD-10-CM | POA: Diagnosis not present

## 2022-04-05 DIAGNOSIS — M25512 Pain in left shoulder: Secondary | ICD-10-CM | POA: Diagnosis not present

## 2022-04-05 DIAGNOSIS — R2990 Unspecified symptoms and signs involving the nervous system: Secondary | ICD-10-CM | POA: Diagnosis not present

## 2022-04-05 DIAGNOSIS — N8189 Other female genital prolapse: Secondary | ICD-10-CM | POA: Diagnosis not present

## 2022-04-05 DIAGNOSIS — I671 Cerebral aneurysm, nonruptured: Secondary | ICD-10-CM

## 2022-04-05 DIAGNOSIS — Z09 Encounter for follow-up examination after completed treatment for conditions other than malignant neoplasm: Secondary | ICD-10-CM | POA: Diagnosis not present

## 2022-04-05 DIAGNOSIS — Z89422 Acquired absence of other left toe(s): Secondary | ICD-10-CM | POA: Diagnosis not present

## 2022-04-05 NOTE — Patient Instructions (Signed)
Continue aspirin 81 mg daily  and Crestor  for secondary stroke prevention  Continue to follow up with PCP regarding blood pressure and cholesterol management  Maintain strict control of hypertension with blood pressure goal below 130/90 and cholesterol with LDL cholesterol (bad cholesterol) goal below 70 mg/dL.   Signs of a Stroke? Follow the BEFAST method:  Balance Watch for a sudden loss of balance, trouble with coordination or vertigo Eyes Is there a sudden loss of vision in one or both eyes? Or double vision?  Face: Ask the person to smile. Does one side of the face droop or is it numb?  Arms: Ask the person to raise both arms. Does one arm drift downward? Is there weakness or numbness of a leg? Speech: Ask the person to repeat a simple phrase. Does the speech sound slurred/strange? Is the person confused ? Time: If you observe any of these signs, call 911.       Thank you for coming to see us at [facility]. I hope we have been able to provide you high quality care today.  You may receive a patient satisfaction survey over the next few weeks. We would appreciate your feedback and comments so that we may continue to improve ourselves and the health of our patients.  

## 2022-04-07 NOTE — Progress Notes (Signed)
I agree with the above plan 

## 2022-04-21 DIAGNOSIS — L97521 Non-pressure chronic ulcer of other part of left foot limited to breakdown of skin: Secondary | ICD-10-CM | POA: Diagnosis not present

## 2022-04-22 ENCOUNTER — Telehealth: Payer: Self-pay | Admitting: *Deleted

## 2022-04-22 ENCOUNTER — Telehealth: Payer: Self-pay | Admitting: Adult Health

## 2022-04-22 ENCOUNTER — Encounter (HOSPITAL_BASED_OUTPATIENT_CLINIC_OR_DEPARTMENT_OTHER): Payer: Self-pay | Admitting: Orthopedic Surgery

## 2022-04-22 ENCOUNTER — Other Ambulatory Visit (HOSPITAL_COMMUNITY): Payer: Self-pay | Admitting: Orthopedic Surgery

## 2022-04-22 ENCOUNTER — Other Ambulatory Visit: Payer: Self-pay

## 2022-04-22 NOTE — Telephone Encounter (Signed)
Pt has been scheduled for urgent add pre op tele appt 04/23/22 @ 8:40. Med rec and consent are done. 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 This is being watched by neurology. Pt states she had a COMPLICATED MIGRAINE, which is how this was caught. Pt just wanted to let us know.

## 2022-04-22 NOTE — Telephone Encounter (Signed)
Patient had a possible TIA on 03/03/2022.  [facility] is requesting surgical clearance to undergo left second toe amputation revision on 04/27/2022.  Would recommend waiting at least 3 months post possible TIA IF aspirin is needing to be held unless procedure is emergent where benefit would outweigh risk.

## 2022-04-22 NOTE — Telephone Encounter (Signed)
Pt has been scheduled for urgent add pre op tele appt 04/23/22 @ 8:40. Med rec and consent are done. 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 This is being watched by neurology. Pt states she had a COMPLICATED MIGRAINE, which is how this was caught. Pt just wanted to let us know.     Patient Consent for Virtual Visit        [name] has provided verbal consent on 04/22/2022 for a virtual visit (video or telephone).   CONSENT FOR VIRTUAL VISIT FOR:  [name]  By participating in this virtual visit I agree to the following:  I hereby voluntarily request, consent and authorize [facility] and its employed or contracted physicians, physician assistants, nurse practitioners or other licensed health care professionals (the Practitioner), to provide me with telemedicine health care services (the "Services") as deemed necessary by the treating Practitioner. I acknowledge and consent to receive the Services by the Practitioner via telemedicine. I understand that the telemedicine visit will involve communicating with the Practitioner through live audiovisual communication technology and the disclosure of certain medical information by electronic transmission. I acknowledge that I have been given the opportunity to request an in-person assessment or other available alternative prior to the telemedicine visit and am voluntarily participating in the telemedicine visit.  I understand that I have the right to withhold or withdraw my consent to the use of telemedicine in the course of my care at any time, without affecting my right to future care or treatment, and that the Practitioner or I may terminate the telemedicine visit at any time. I understand that I have the right to inspect all information obtained and/or recorded in the course of the telemedicine visit and may receive copies of  available information for a reasonable fee.  I understand that some of the potential risks of receiving the Services via telemedicine include:  Delay or interruption in medical evaluation due to technological equipment failure or disruption; Information transmitted may not be sufficient (e.g. poor resolution of images) to allow for appropriate medical decision making by the Practitioner; and/or  In rare instances, security protocols could fail, causing a breach of personal health information.  Furthermore, I acknowledge that it is my responsibility to provide information about my medical history, conditions and care that is complete and accurate to the best of my ability. I acknowledge that Practitioner's advice, recommendations, and/or decision may be based on factors not within their control, such as incomplete or inaccurate data provided by me or distortions of diagnostic images or specimens that may result from electronic transmissions. I understand that the practice of medicine is not an exact science and that Practitioner makes no warranties or guarantees regarding treatment outcomes. I acknowledge that a copy of this consent can be made available to me via my patient portal ([facility]), or I can request a printed copy by calling the office of [facility].    I understand that my insurance will be billed for this visit.   I have read or had this consent read to me. I understand the contents of this consent, which adequately explains the benefits and risks of the Services being provided via telemedicine.  I have been provided ample opportunity to ask questions regarding this consent and the Services and have had my questions answered to my satisfaction. I give my informed  consent for the services to be provided through the use of telemedicine in my medical care

## 2022-04-22 NOTE — Telephone Encounter (Signed)
Faxed completed, signed surgical clearance form and NP's note to [facility].  Received confirmation of fax.

## 2022-04-22 NOTE — Telephone Encounter (Signed)
Surgeon office called today with a STAT clearance request. See notes below.      Pre-operative Risk Assessment    Patient Name: [name]  DOB: 20-Mar-1956 MRN: [id number]      Request for Surgical Clearance    Procedure:   AMPUTATION REVISION 2ND TOE (LEFT)  Date of Surgery:  Clearance 04/27/22 URGENT                                 Surgeon:  DR. Jenny Reichmann HEWITT Surgeon's Group or Practice Name:  [facility] Phone number:  [phone] Fax number:  [phone] ATTN: MEGAN DAVIS   Type of Clearance Requested:   - Medical ; ASA    Type of Anesthesia:  Local & MAC   Additional requests/questions:    Jiles Prows   04/22/2022, 10:44 AM

## 2022-04-22 NOTE — Telephone Encounter (Signed)
Will keep an eye out for the surgical clearance form to complete and send back to [facility].

## 2022-04-22 NOTE — Progress Notes (Signed)
Requested cards and neuro surgical clearance with Megan at Dr. Nona Dell office.

## 2022-04-22 NOTE — Telephone Encounter (Signed)
Primary Cardiologist:James Hochrein, MD   Preoperative team, please contact this patient and set up a phone call appointment for further preoperative risk assessment. Please obtain consent and complete medication review. Thank you for your help.   She may hold aspirin for 5-7 days prior to surgery and should resume as soon as hemodynamically stable post-operatively.    Emmaline Life, NP-C  04/22/2022, 11:22 AM [address], [address] Office [phone] Fax [phone]

## 2022-04-22 NOTE — Progress Notes (Signed)
Pt chart reviewed with Dr. Daiva Huge- ok to proceed with planned procedure at [facility] without further neuro follow up or clearance.

## 2022-04-22 NOTE — Telephone Encounter (Signed)
[facility]) Need urgent surgical clearance for Second Toe Amputation Revision. Faxing surgical clearance form to [facility]: [phone]

## 2022-04-22 NOTE — Telephone Encounter (Signed)
Form received, placed in Jessica's bin to review and sign for clearance for the pt.

## 2022-04-23 ENCOUNTER — Telehealth: Payer: Self-pay | Admitting: Cardiology

## 2022-04-23 ENCOUNTER — Ambulatory Visit: Payer: Medicare HMO | Attending: Nurse Practitioner | Admitting: Nurse Practitioner

## 2022-04-23 ENCOUNTER — Encounter: Payer: Self-pay | Admitting: Nurse Practitioner

## 2022-04-23 DIAGNOSIS — Z01810 Encounter for preprocedural cardiovascular examination: Secondary | ICD-10-CM

## 2022-04-23 NOTE — Telephone Encounter (Signed)
Pt called stating she was supposed to receive a call from  preop today at 8:40am but never received one. Pt asked if someone could still call after 3:00pm today

## 2022-04-23 NOTE — Progress Notes (Signed)
Virtual Visit via Telephone Note   Because of [name]'s co-morbid illnesses, she is at least at moderate risk for complications without adequate follow up.  This format is felt to be most appropriate for this patient at this time.  The patient did not have access to video technology/had technical difficulties with video requiring transitioning to audio format only (telephone).  All issues noted in this document were discussed and addressed.  No physical exam could be performed with this format.  Please refer to the patient's chart for her consent to telehealth for [facility].  Evaluation Performed:  Preoperative cardiovascular risk assessment _____________   Date:  04/23/2022   Patient ID:  [name], DOB [birth year], MRN [id number] Patient Location:  Home Provider location:   Office  Primary Care Provider:  [name], MD Primary Cardiologist:  Minus Breeding, MD  Chief Complaint / Patient Profile   66 y.o. 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  Past Medical History    Past Medical History:  Diagnosis Date   Allergy    seasonal allergies   Anxiety    on meds   Arthritis    low back issues   Carpal tunnel syndrome    LEFT   Cataract    bilateral -sx   Cervical pain (neck)    Complication of anesthesia    DIFFICULTY WAKING UP   Coronary artery disease    Depression    on meds   GERD (gastroesophageal reflux disease)    on meds   Hyperlipidemia    diet controlled- not on meds at this time (08/05/2020)   Insomnia    Mass of right lower leg    Morbid obesity (HCC)    OSA on CPAP    uses CPAP nightly   Osteopenia    neck   PONV (postoperative nausea and  vomiting)    Post-polio syndrome    With left leg paralysis   S/P TAVR (transcatheter aortic valve replacement)    Sigmoid diverticulosis    Ulnar nerve abnormality    Past Surgical History:  Procedure Laterality Date   AMPUTATION TOE Left 02/18/2022   Procedure: Second and Third distal interphalangeal and Fourth proximal interphalangeal  toe amputations;  Surgeon: Wylene Simmer, MD;  Location: [facility];  Service: Orthopedics;  Laterality: Left;   ANKLE FUSION     left   APPENDECTOMY     BIOPSY  05/04/2021   Procedure: BIOPSY;  Surgeon: Rush Landmark Telford Nab., MD;  Location: [facility];  Service: Gastroenterology;;   CHOLECYSTECTOMY     COLONOSCOPY     COLONOSCOPY N/A 05/04/2021   Procedure: COLONOSCOPY;  Surgeon: Irving Copas., MD;  Location: [facility];  Service: Gastroenterology;  Laterality: N/A;   ESOPHAGOGASTRODUODENOSCOPY (EGD) WITH PROPOFOL N/A 05/04/2021   Procedure: ESOPHAGOGASTRODUODENOSCOPY (EGD) WITH PROPOFOL;  Surgeon: Rush Landmark Telford Nab., MD;  Location: [facility];  Service: Gastroenterology;  Laterality: N/A;   EYE SURGERY Bilateral 2020   cataracts removed August and September 2020   IR 3D INDEPENDENT WKST  03/23/2022   IR ANGIO INTRA EXTRACRAN SEL INTERNAL CAROTID BILAT MOD SED  03/23/2022   IR ANGIO VERTEBRAL SEL VERTEBRAL UNI L MOD SED  03/23/2022   IR US GUIDE VASC ACCESS  RIGHT  03/23/2022   JOINT REPLACEMENT Right 2003   three replacements; 2003 is last one   KNEE ARTHROPLASTY     LEG SURGERY     muscle surgery related to polio   MASS EXCISION Right 01/08/2020   Procedure: EXCISION RIGHT LEG MASS;  Surgeon: Erroll Luna, MD;  Location: [facility];  Service: General;  Laterality: Right;   POLYPECTOMY  05/04/2021   Procedure: POLYPECTOMY;  Surgeon: Irving Copas., MD;  Location: [facility];  Service: Gastroenterology;;   RECTAL PROLAPSE REPAIR  2020   pessary placed   RIGHT/LEFT HEART CATH AND  CORONARY ANGIOGRAPHY N/A 04/02/2019   Procedure: RIGHT/LEFT HEART CATH AND CORONARY ANGIOGRAPHY;  Surgeon: Nelva Bush, MD;  Location: [facility];  Service: Cardiovascular;  Laterality: N/A;   SAVORY DILATION N/A 05/04/2021   Procedure: SAVORY DILATION;  Surgeon: Rush Landmark Telford Nab., MD;  Location: [facility];  Service: Gastroenterology;  Laterality: N/A;   TEE WITHOUT CARDIOVERSION N/A 04/17/2019   Procedure: TRANSESOPHAGEAL ECHOCARDIOGRAM (TEE);  Surgeon: Sherren Mocha, MD;  Location: [facility];  Service: Open Heart Surgery;  Laterality: N/A;   TOTAL HIP ARTHROPLASTY     right x 3   TRANSCATHETER AORTIC VALVE REPLACEMENT, TRANSFEMORAL N/A 04/17/2019   Procedure: TRANSCATHETER AORTIC VALVE REPLACEMENT, TRANSFEMORAL;  Surgeon: Sherren Mocha, MD;  Location: [facility];  Service: Open Heart Surgery;  Laterality: N/A;   UMBILICAL HERNIA REPAIR      Allergies  Allergies  Allergen Reactions   Amoxicillin Anaphylaxis and Swelling    Did it involve swelling of the face/tongue/throat, SOB, or low BP? Yes Did it involve sudden or severe rash/hives, skin peeling, or any reaction on the inside of your mouth or nose? No Did you need to seek medical attention at a hospital or doctor's office? Yes When did it last happen?    yrs ago   If all above answers are "NO", may proceed with cephalosporin use.    Flagyl [Metronidazole] Anaphylaxis   Plaquenil [Hydroxychloroquine Sulfate] Other (See Comments)    Blurred vision, joint pain   Acetaminophen-Codeine Nausea Only    Tylenol #3   Codeine Nausea Only   Doxycycline Nausea And Vomiting   Percocet [Oxycodone-Acetaminophen] Nausea Only   Sulfa Antibiotics Nausea Only    History of Present Illness    [name] is a 66 y.o. female who presents via audio/video conferencing for a telehealth visit today.  Pt was last seen in cardiology clinic on 12/21/2021 by Doreene Adas, PA.Marland Kitchen  At that time [name]  was doing well.  The patient is now pending procedure as outlined above. Since her last visit, she  denies chest pain, shortness of breath, lower extremity edema, fatigue, palpitations, melena, hematuria, hemoptysis, diaphoresis, weakness, presyncope, syncope, orthopnea, and PND. She is in a wheelchair but lives alone, does her own house work, ADLs, grocery shopping, and yard work. 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 She will have repeat imaging in 6 months.   Home Medications    Prior to Admission medications   Medication Sig Start Date End Date Taking? Authorizing Provider  acetaminophen (TYLENOL) 500 MG tablet Take 1,000 mg by mouth every 6 (six) hours as needed for moderate pain or headache.    [provider]  ALPRAZolam Duanne Moron) 0.5 MG tablet Take 0.5 mg by mouth 2 (two) times daily as needed for sleep.  [provider]  Aspirin 81 MG CAPS Take 1 mg by mouth daily.    [provider]  cetirizine (ZYRTEC) 10 MG tablet Take 10 mg by mouth as needed for allergies.    [provider]  Cholecalciferol 50 MCG (2000 UT) TABS Take 2,000 Units by mouth daily.     [provider]  Docusate Sodium (COLACE PO) Take 2 capsules by mouth daily.    [provider]  docusate sodium (COLACE) 100 MG capsule Take 1 capsule (100 mg total) by mouth 2 (two) times daily. While taking narcotic pain medicine. Patient not taking: Reported on 03/18/2022 02/18/22   Corky Sing, PA-C  fluticasone [facility]) 50 MCG/ACT nasal spray Place 1 spray into both nostrils as needed for allergies or rhinitis.    [provider]  gabapentin (NEURONTIN) 300 MG capsule Take 300 mg by mouth daily.    [provider]  ibuprofen (ADVIL) 200 MG tablet Take 200 mg by mouth every 6 (six) hours as needed for moderate pain.    [provider]  pantoprazole (PROTONIX)  40 MG tablet Take 40 mg by mouth daily.    [provider]  Polyethyl Glycol-Propyl Glycol (SYSTANE OP) Place 1-2 drops into both eyes as needed (dry eyes).    [provider]  rosuvastatin (CRESTOR) 20 MG tablet Take 1 tablet (20 mg total) by mouth daily. Patient taking differently: Take 20 mg by mouth at bedtime. 03/04/22   Elgergawy, Silver Huguenin, MD  traZODone (DESYREL) 100 MG tablet Take 100 mg by mouth at bedtime as needed for sleep.    [provider]  venlafaxine XR (EFFEXOR-XR) 75 MG 24 hr capsule Take 75 mg by mouth daily with breakfast.    [provider]    Physical Exam    Vital Signs:  JULINA ALTMANN does not have vital signs available for review today.  Given telephonic nature of communication, physical exam is limited. AAOx3. NAD. Normal affect.  Speech and respirations are unlabored.  Accessory Clinical Findings    None  Assessment & Plan    1.  Preoperative Cardiovascular Risk Assessment: The patient is doing well from a cardiac perspective. Therefore, based on ACC/AHA guidelines, the patient would be at acceptable risk for the planned procedure without further cardiovascular testing. According to the Revised Cardiac Risk Index (RCRI), her Perioperative Risk of Major Cardiac Event is (%): 0.9 Her Functional Capacity in METs is: 5.38 according to the Duke Activity Status Index (DASI).  The patient was advised that if she develops new symptoms prior to surgery to contact our office to arrange for a follow-up visit, and she verbalized understanding.  A copy of this note will be routed to requesting surgeon.  Time:   Today, I have spent 10 minutes with the patient with telehealth technology discussing medical history, symptoms, and management plan.    Emmaline Life, NP-C  04/23/2022, 3:12 PM [address], [address] Office [phone] Fax [phone]

## 2022-04-23 NOTE — Telephone Encounter (Signed)
I s/w the pt and apologized for our error. Pt has been rescheduled for later today at 3:20.

## 2022-04-26 NOTE — Progress Notes (Signed)

## 2022-04-27 ENCOUNTER — Other Ambulatory Visit: Payer: Self-pay

## 2022-04-27 ENCOUNTER — Encounter (HOSPITAL_BASED_OUTPATIENT_CLINIC_OR_DEPARTMENT_OTHER)
Admission: RE | Disposition: A | Discharge status: Home / Self Care | Payer: Self-pay | Source: Home / Self Care | Attending: Orthopedic Surgery

## 2022-04-27 ENCOUNTER — Encounter (HOSPITAL_BASED_OUTPATIENT_CLINIC_OR_DEPARTMENT_OTHER): Payer: Self-pay | Admitting: Orthopedic Surgery

## 2022-04-27 ENCOUNTER — Ambulatory Visit (HOSPITAL_BASED_OUTPATIENT_CLINIC_OR_DEPARTMENT_OTHER)
Admission: RE | Admit: 2022-04-27 | Discharge: 2022-04-27 | Disposition: A | Discharge status: Home / Self Care | Payer: Medicare HMO | Attending: Orthopedic Surgery | Admitting: Orthopedic Surgery

## 2022-04-27 ENCOUNTER — Ambulatory Visit (HOSPITAL_BASED_OUTPATIENT_CLINIC_OR_DEPARTMENT_OTHER): Payer: Medicare HMO | Admitting: Certified Registered"

## 2022-04-27 DIAGNOSIS — M8580 Other specified disorders of bone density and structure, unspecified site: Secondary | ICD-10-CM | POA: Insufficient documentation

## 2022-04-27 DIAGNOSIS — L97529 Non-pressure chronic ulcer of other part of left foot with unspecified severity: Secondary | ICD-10-CM

## 2022-04-27 DIAGNOSIS — Z8612 Personal history of poliomyelitis: Secondary | ICD-10-CM | POA: Insufficient documentation

## 2022-04-27 DIAGNOSIS — K219 Gastro-esophageal reflux disease without esophagitis: Secondary | ICD-10-CM | POA: Insufficient documentation

## 2022-04-27 DIAGNOSIS — G709 Myoneural disorder, unspecified: Secondary | ICD-10-CM | POA: Diagnosis not present

## 2022-04-27 DIAGNOSIS — I509 Heart failure, unspecified: Secondary | ICD-10-CM

## 2022-04-27 DIAGNOSIS — F32A Depression, unspecified: Secondary | ICD-10-CM | POA: Insufficient documentation

## 2022-04-27 DIAGNOSIS — G8314 Monoplegia of lower limb affecting left nondominant side: Secondary | ICD-10-CM | POA: Diagnosis not present

## 2022-04-27 DIAGNOSIS — F419 Anxiety disorder, unspecified: Secondary | ICD-10-CM | POA: Diagnosis not present

## 2022-04-27 DIAGNOSIS — M1990 Unspecified osteoarthritis, unspecified site: Secondary | ICD-10-CM | POA: Diagnosis not present

## 2022-04-27 DIAGNOSIS — Z9989 Dependence on other enabling machines and devices: Secondary | ICD-10-CM | POA: Diagnosis not present

## 2022-04-27 DIAGNOSIS — Z01818 Encounter for other preprocedural examination: Secondary | ICD-10-CM

## 2022-04-27 DIAGNOSIS — E785 Hyperlipidemia, unspecified: Secondary | ICD-10-CM | POA: Insufficient documentation

## 2022-04-27 DIAGNOSIS — Z952 Presence of prosthetic heart valve: Secondary | ICD-10-CM | POA: Diagnosis not present

## 2022-04-27 DIAGNOSIS — Z6838 Body mass index (BMI) 38.0-38.9, adult: Secondary | ICD-10-CM | POA: Diagnosis not present

## 2022-04-27 DIAGNOSIS — I2510 Atherosclerotic heart disease of native coronary artery without angina pectoris: Secondary | ICD-10-CM | POA: Insufficient documentation

## 2022-04-27 DIAGNOSIS — G4733 Obstructive sleep apnea (adult) (pediatric): Secondary | ICD-10-CM | POA: Insufficient documentation

## 2022-04-27 DIAGNOSIS — L97521 Non-pressure chronic ulcer of other part of left foot limited to breakdown of skin: Secondary | ICD-10-CM | POA: Diagnosis not present

## 2022-04-27 HISTORY — PX: AMPUTATION TOE: SHX6595

## 2022-04-27 SURGERY — AMPUTATION, TOE
Anesthesia: Monitor Anesthesia Care | Site: Toe | Laterality: Left

## 2022-04-27 MED ORDER — VANCOMYCIN HCL 1500 MG/300ML IV SOLN: 1500.0000 mg | INTRAVENOUS | Status: DC

## 2022-04-27 MED ORDER — SODIUM CHLORIDE 0.9 % IV SOLN: INTRAVENOUS | Status: DC

## 2022-04-27 MED ORDER — PROPOFOL 10 MG/ML IV BOLUS
INTRAVENOUS | Status: DC | PRN
  Administered 2022-04-27: 20 mg via INTRAVENOUS

## 2022-04-27 MED ORDER — PROMETHAZINE HCL 25 MG/ML IJ SOLN: 6.2500 mg | INTRAMUSCULAR | Status: DC | PRN

## 2022-04-27 MED ORDER — VANCOMYCIN HCL IN DEXTROSE 1-5 GM/200ML-% IV SOLN
INTRAVENOUS | Status: AC
  Filled 2022-04-27: qty 200

## 2022-04-27 MED ORDER — VANCOMYCIN HCL 500 MG IV SOLR
INTRAVENOUS | Status: AC
  Filled 2022-04-27: qty 10

## 2022-04-27 MED ORDER — LIDOCAINE HCL (PF) 1 % IJ SOLN
INTRAMUSCULAR | Status: AC
  Filled 2022-04-27: qty 30

## 2022-04-27 MED ORDER — CEFAZOLIN SODIUM-DEXTROSE 2-3 GM-%(50ML) IV SOLR
INTRAVENOUS | Status: DC | PRN
  Administered 2022-04-27: 2 g via INTRAVENOUS

## 2022-04-27 MED ORDER — BUPIVACAINE-EPINEPHRINE 0.5% -1:200000 IJ SOLN
INTRAMUSCULAR | Status: DC | PRN
  Administered 2022-04-27: 10 mL

## 2022-04-27 MED ORDER — LIDOCAINE 2% (20 MG/ML) 5 ML SYRINGE
INTRAMUSCULAR | Status: DC | PRN
  Administered 2022-04-27: 60 mg via INTRAVENOUS

## 2022-04-27 MED ORDER — 0.9 % SODIUM CHLORIDE (POUR BTL) OPTIME
TOPICAL | Status: DC | PRN
  Administered 2022-04-27: 200 mL

## 2022-04-27 MED ORDER — FENTANYL CITRATE (PF) 100 MCG/2ML IJ SOLN
INTRAMUSCULAR | Status: DC | PRN
  Administered 2022-04-27: 50 ug via INTRAVENOUS

## 2022-04-27 MED ORDER — CEFAZOLIN SODIUM-DEXTROSE 2-4 GM/100ML-% IV SOLN
INTRAVENOUS | Status: AC
  Filled 2022-04-27: qty 100

## 2022-04-27 MED ORDER — ONDANSETRON HCL 4 MG/2ML IJ SOLN
INTRAMUSCULAR | Status: DC | PRN
  Administered 2022-04-27: 4 mg via INTRAVENOUS

## 2022-04-27 MED ORDER — VANCOMYCIN HCL 500 MG/100ML IV SOLN
INTRAVENOUS | Status: AC
  Filled 2022-04-27: qty 100

## 2022-04-27 MED ORDER — FENTANYL CITRATE (PF) 100 MCG/2ML IJ SOLN
INTRAMUSCULAR | Status: AC
  Filled 2022-04-27: qty 2

## 2022-04-27 MED ORDER — HYDROMORPHONE HCL 1 MG/ML IJ SOLN: 0.2500 mg | INTRAMUSCULAR | Status: DC | PRN

## 2022-04-27 MED ORDER — LACTATED RINGERS IV SOLN: INTRAVENOUS | Status: DC

## 2022-04-27 MED ORDER — BUPIVACAINE-EPINEPHRINE (PF) 0.5% -1:200000 IJ SOLN
INTRAMUSCULAR | Status: AC
  Filled 2022-04-27: qty 30

## 2022-04-27 MED ORDER — PROPOFOL 500 MG/50ML IV EMUL
INTRAVENOUS | Status: DC | PRN
  Administered 2022-04-27: 75 ug/kg/min via INTRAVENOUS

## 2022-04-27 MED ORDER — VANCOMYCIN HCL 500 MG IV SOLR
INTRAVENOUS | Status: DC | PRN
  Administered 2022-04-27: 500 mg via TOPICAL

## 2022-04-27 SURGICAL SUPPLY — 61 items
APL PRP STRL LF DISP 70% ISPRP (MISCELLANEOUS) ×1
BLADE AVERAGE 25X9 (BLADE) IMPLANT
BLADE OSC/SAG .038X5.5 CUT EDG (BLADE) IMPLANT
BLADE SURG 10 STRL SS (BLADE) IMPLANT
BLADE SURG 15 STRL LF DISP TIS (BLADE) ×1 IMPLANT
BLADE SURG 15 STRL SS (BLADE) ×2
BNDG CMPR 9X4 STRL LF SNTH (GAUZE/BANDAGES/DRESSINGS) ×1
BNDG ELASTIC 4X5.8 VLCR STR LF (GAUZE/BANDAGES/DRESSINGS) ×1 IMPLANT
BNDG ESMARK 4X9 LF (GAUZE/BANDAGES/DRESSINGS) ×1 IMPLANT
BNDG GZE 12X3 1 PLY HI ABS (GAUZE/BANDAGES/DRESSINGS)
BNDG STRETCH GAUZE 3IN X12FT (GAUZE/BANDAGES/DRESSINGS) IMPLANT
CHLORAPREP W/TINT 26 (MISCELLANEOUS) ×1 IMPLANT
COVER BACK TABLE 60X90IN (DRAPES) ×1 IMPLANT
CUFF TOURN SGL QUICK 24 (TOURNIQUET CUFF)
CUFF TRNQT CYL 24X4X16.5-23 (TOURNIQUET CUFF) IMPLANT
DRAPE EXTREMITY T 121X128X90 (DISPOSABLE) ×1 IMPLANT
DRAPE SURG 17X23 STRL (DRAPES) ×1 IMPLANT
DRAPE U-SHAPE 47X51 STRL (DRAPES) ×1 IMPLANT
DRSG EMULSION OIL 3X3 NADH (GAUZE/BANDAGES/DRESSINGS) IMPLANT
DRSG MEPITEL 4X7.2 (GAUZE/BANDAGES/DRESSINGS) ×1 IMPLANT
ELECT REM PT RETURN 9FT ADLT (ELECTROSURGICAL) ×1
ELECTRODE REM PT RTRN 9FT ADLT (ELECTROSURGICAL) ×1 IMPLANT
GAUZE PAD ABD 8X10 STRL (GAUZE/BANDAGES/DRESSINGS) IMPLANT
GAUZE SPONGE 4X4 12PLY STRL (GAUZE/BANDAGES/DRESSINGS) ×1 IMPLANT
GLOVE BIO SURGEON STRL SZ8 (GLOVE) ×1 IMPLANT
GLOVE BIOGEL PI IND STRL 7.0 (GLOVE) IMPLANT
GLOVE BIOGEL PI IND STRL 8 (GLOVE) ×2 IMPLANT
GLOVE ECLIPSE 8.0 STRL XLNG CF (GLOVE) ×1 IMPLANT
GLOVE SURG SS PI 6.5 STRL IVOR (GLOVE) IMPLANT
GOWN STRL REUS W/ TWL LRG LVL3 (GOWN DISPOSABLE) ×1 IMPLANT
GOWN STRL REUS W/ TWL XL LVL3 (GOWN DISPOSABLE) ×2 IMPLANT
GOWN STRL REUS W/TWL LRG LVL3 (GOWN DISPOSABLE) ×1
GOWN STRL REUS W/TWL XL LVL3 (GOWN DISPOSABLE) ×2
NDL HYPO 25X1 1.5 SAFETY (NEEDLE) IMPLANT
NDL SAFETY ECLIP 18X1.5 (MISCELLANEOUS) IMPLANT
NEEDLE HYPO 25X1 1.5 SAFETY (NEEDLE) ×1 IMPLANT
NS IRRIG 1000ML POUR BTL (IV SOLUTION) ×1 IMPLANT
PACK BASIN DAY SURGERY FS (CUSTOM PROCEDURE TRAY) ×1 IMPLANT
PAD CAST 4YDX4 CTTN HI CHSV (CAST SUPPLIES) ×1 IMPLANT
PADDING CAST COTTON 4X4 STRL (CAST SUPPLIES) ×1
PENCIL SMOKE EVACUATOR (MISCELLANEOUS) ×1 IMPLANT
SANITIZER HAND PURELL FF 515ML (MISCELLANEOUS) ×1 IMPLANT
SHEET MEDIUM DRAPE 40X70 STRL (DRAPES) ×1 IMPLANT
SLEEVE SCD COMPRESS KNEE MED (STOCKING) ×1 IMPLANT
SPIKE FLUID TRANSFER (MISCELLANEOUS) IMPLANT
SPONGE T-LAP 18X18 ~~LOC~~+RFID (SPONGE) ×1 IMPLANT
STOCKINETTE 6  STRL (DRAPES) ×1
STOCKINETTE 6 STRL (DRAPES) ×1 IMPLANT
SUCTION FRAZIER HANDLE 10FR (MISCELLANEOUS) ×1
SUCTION TUBE FRAZIER 10FR DISP (MISCELLANEOUS) IMPLANT
SUT ETHILON 2 0 FS 18 (SUTURE) IMPLANT
SUT ETHILON 2 0 FSLX (SUTURE) IMPLANT
SUT ETHILON 3 0 PS 1 (SUTURE) IMPLANT
SUT MNCRL AB 3-0 PS2 18 (SUTURE) IMPLANT
SWAB COLLECTION DEVICE MRSA (MISCELLANEOUS) IMPLANT
SWAB CULTURE ESWAB REG 1ML (MISCELLANEOUS) IMPLANT
SYR BULB EAR ULCER 3OZ GRN STR (SYRINGE) ×1 IMPLANT
SYR CONTROL 10ML LL (SYRINGE) IMPLANT
TOWEL GREEN STERILE FF (TOWEL DISPOSABLE) ×1 IMPLANT
TUBE CONNECTING 20X1/4 (TUBING) IMPLANT
UNDERPAD 30X36 HEAVY ABSORB (UNDERPADS AND DIAPERS) ×1 IMPLANT

## 2022-04-27 NOTE — Anesthesia Preprocedure Evaluation (Signed)
Anesthesia Evaluation  Patient identified by MRN, date of birth, ID band Patient awake    Reviewed: Allergy & Precautions, NPO status , Patient's Chart, lab work & pertinent test results  History of Anesthesia Complications (+) PONV and history of anesthetic complications  Airway Mallampati: III  TM Distance: >3 FB Neck ROM: Full    Dental no notable dental hx. (+) Teeth Intact, Dental Advisory Given   Pulmonary sleep apnea and Continuous Positive Airway Pressure Ventilation    Pulmonary exam normal breath sounds clear to auscultation       Cardiovascular + CAD and +CHF  Normal cardiovascular exam+ Valvular Problems/Murmurs AS  Rhythm:Regular  Hx/o AS-severe S/P TAVR  Echo 01/05/22 1. The aortic valve was not well visualized. 26 mm Sapien Valve, mean gradinet 9 mm Hg, no PVL, DVI 0.40. Aortic valve regurgitation is not visualized. No aortic stenosis is present. Echo findings are consistent with normal structure and function of the aortic valve prosthesis.  2. Left ventricular ejection fraction, by estimation, is 60 to 65%. The left ventricle has normal function. The left ventricle has no regional wall motion abnormalities. Left ventricular diastolic parameters are consistent with Grade I diastolic dysfunction (impaired relaxation).  3. Right ventricular systolic function is normal. The right ventricular size is normal. Tricuspid regurgitation signal is inadequate for assessing PA pressure.  4. The mitral valve is normal in structure. No evidence of mitral valve regurgitation. No evidence of mitral stenosis.    Neuro/Psych  Headaches PSYCHIATRIC DISORDERS Anxiety Depression    Post polio syndrome Paralysis Left lower leg Ulnar neuropathy  Neuromuscular disease    GI/Hepatic Neg liver ROS,GERD  Medicated and Controlled,,  Endo/Other  Hyperlipidemia  Renal/GU negative Renal ROS  negative genitourinary   Musculoskeletal  (+)  Arthritis , Osteoarthritis,  Osteopenia Hammertoes left 3rd (DIP) and 4th (PIP) toes   Abdominal  (+) + obese  Peds  Hematology   Anesthesia Other Findings   Reproductive/Obstetrics                             Anesthesia Physical Anesthesia Plan  ASA: 3  Anesthesia Plan: MAC   Post-op Pain Management: Minimal or no pain anticipated   Induction: Intravenous  PONV Risk Score and Plan: 3 and Treatment may vary due to age or medical condition, Ondansetron, Midazolam and Propofol infusion  Airway Management Planned: Simple Face Mask  Additional Equipment: None  Intra-op Plan:   Post-operative Plan:   Informed Consent: I have reviewed the patients History and Physical, chart, labs and discussed the procedure including the risks, benefits and alternatives for the proposed anesthesia with the patient or authorized representative who has indicated his/her understanding and acceptance.     Dental advisory given  Plan Discussed with: CRNA and Anesthesiologist  Anesthesia Plan Comments:         Anesthesia Quick Evaluation

## 2022-04-27 NOTE — H&P (Signed)
[name] is an 66 y.o. female.   Chief Complaint: left 2nd toe ulcer HPI: 66 y/o female with complex PMH c/o left 2nd toe recurrent ulcer after amputation 2 months ago.  She has failed treatment with activity modification, shoewear modification and wound care.  She presents today for revision 2nd toe amputation.  Past Medical History:  Diagnosis Date   Allergy    seasonal allergies   Anxiety    on meds   Arthritis    low back issues   Carpal tunnel syndrome    LEFT   Cataract    bilateral -sx   Cervical pain (neck)    Complication of anesthesia    DIFFICULTY WAKING UP   Coronary artery disease    Depression    on meds   GERD (gastroesophageal reflux disease)    on meds   Hyperlipidemia    diet controlled- not on meds at this time (08/05/2020)   Insomnia    Mass of right lower leg    Morbid obesity (HCC)    OSA on CPAP    uses CPAP nightly   Osteopenia    neck   PONV (postoperative nausea and vomiting)    Post-polio syndrome    With left leg paralysis   S/P TAVR (transcatheter aortic valve replacement)    Sigmoid diverticulosis    Ulnar nerve abnormality     Past Surgical History:  Procedure Laterality Date   AMPUTATION TOE Left 02/18/2022   Procedure: Second and Third distal interphalangeal and Fourth proximal interphalangeal  toe amputations;  Surgeon: Wylene Simmer, MD;  Location: [place];  Service: Orthopedics;  Laterality: Left;   ANKLE FUSION     left   APPENDECTOMY     BIOPSY  05/04/2021   Procedure: BIOPSY;  Surgeon: Rush Landmark Telford Nab., MD;  Location: [facility];  Service: Gastroenterology;;   CHOLECYSTECTOMY     COLONOSCOPY     COLONOSCOPY N/A 05/04/2021   Procedure: COLONOSCOPY;  Surgeon: Irving Copas., MD;  Location: [facility];  Service: Gastroenterology;  Laterality: N/A;   ESOPHAGOGASTRODUODENOSCOPY (EGD) WITH PROPOFOL N/A 05/04/2021   Procedure: ESOPHAGOGASTRODUODENOSCOPY (EGD) WITH PROPOFOL;  Surgeon:  Rush Landmark Telford Nab., MD;  Location: [facility];  Service: Gastroenterology;  Laterality: N/A;   EYE SURGERY Bilateral 2020   cataracts removed August and September 2020   IR 3D INDEPENDENT WKST  03/23/2022   IR ANGIO INTRA EXTRACRAN SEL INTERNAL CAROTID BILAT MOD SED  03/23/2022   IR ANGIO VERTEBRAL SEL VERTEBRAL UNI L MOD SED  03/23/2022   IR US GUIDE VASC ACCESS RIGHT  03/23/2022   JOINT REPLACEMENT Right 2003   three replacements; 2003 is last one   KNEE ARTHROPLASTY     LEG SURGERY     muscle surgery related to polio   MASS EXCISION Right 01/08/2020   Procedure: EXCISION RIGHT LEG MASS;  Surgeon: Erroll Luna, MD;  Location: [place];  Service: General;  Laterality: Right;   POLYPECTOMY  05/04/2021   Procedure: POLYPECTOMY;  Surgeon: Irving Copas., MD;  Location: [facility];  Service: Gastroenterology;;   RECTAL PROLAPSE REPAIR  2020   pessary placed   RIGHT/LEFT HEART CATH AND CORONARY ANGIOGRAPHY N/A 04/02/2019   Procedure: RIGHT/LEFT HEART CATH AND CORONARY ANGIOGRAPHY;  Surgeon: Nelva Bush, MD;  Location: [place] [facility];  Service: Cardiovascular;  Laterality: N/A;   SAVORY DILATION N/A 05/04/2021   Procedure: SAVORY DILATION;  Surgeon: Irving Copas., MD;  Location: [facility];  Service: Gastroenterology;  Laterality: N/A;   TEE WITHOUT CARDIOVERSION N/A 04/17/2019   Procedure: TRANSESOPHAGEAL ECHOCARDIOGRAM (TEE);  Surgeon: Sherren Mocha, MD;  Location: [place] [facility];  Service: Open Heart Surgery;  Laterality: N/A;   TOTAL HIP ARTHROPLASTY     right x 3   TRANSCATHETER AORTIC VALVE REPLACEMENT, TRANSFEMORAL N/A 04/17/2019   Procedure: TRANSCATHETER AORTIC VALVE REPLACEMENT, TRANSFEMORAL;  Surgeon: Sherren Mocha, MD;  Location: [place] [facility];  Service: Open Heart Surgery;  Laterality: N/A;   UMBILICAL HERNIA REPAIR      Family History  Problem Relation Age of Onset   Ovarian cancer Mother 75   Colon  cancer Mother 28       mets from ovarian CA   Heart disease Father    CAD Other        FAM Hx OF CAD   Diabetes Other        FAM Hx of DM   Hypertension Other    Esophageal cancer Neg Hx    Rectal cancer Neg Hx    Stomach cancer Neg Hx    Inflammatory bowel disease Neg Hx    Liver disease Neg Hx    Pancreatic cancer Neg Hx    Social History:  reports that she has never smoked. She has never used smokeless tobacco. She reports that she does not drink alcohol and does not use drugs.  Allergies:  Allergies  Allergen Reactions   Amoxicillin Anaphylaxis and Swelling    Did it involve swelling of the face/tongue/throat, SOB, or low BP? Yes Did it involve sudden or severe rash/hives, skin peeling, or any reaction on the inside of your mouth or nose? No Did you need to seek medical attention at a hospital or doctor's office? Yes When did it last happen?    yrs ago   If all above answers are "NO", may proceed with cephalosporin use.    Flagyl [Metronidazole] Anaphylaxis   Plaquenil [Hydroxychloroquine Sulfate] Other (See Comments)    Blurred vision, joint pain   Acetaminophen-Codeine Nausea Only    Tylenol #3   Codeine Nausea Only   Doxycycline Nausea And Vomiting   Percocet [Oxycodone-Acetaminophen] Nausea Only   Sulfa Antibiotics Nausea Only    Medications Prior to Admission  Medication Sig Dispense Refill   acetaminophen (TYLENOL) 500 MG tablet Take 1,000 mg by mouth every 6 (six) hours as needed for moderate pain or headache.     Aspirin 81 MG CAPS Take 1 mg by mouth daily.     cetirizine (ZYRTEC) 10 MG tablet Take 10 mg by mouth as needed for allergies.     Docusate Sodium (COLACE PO) Take 2 capsules by mouth daily.     gabapentin (NEURONTIN) 300 MG capsule Take 300 mg by mouth daily.     ibuprofen (ADVIL) 200 MG tablet Take 200 mg by mouth every 6 (six) hours as needed for moderate pain.     pantoprazole (PROTONIX) 40 MG tablet Take 40 mg by mouth daily.     rosuvastatin  (CRESTOR) 20 MG tablet Take 1 tablet (20 mg total) by mouth daily. (Patient taking differently: Take 20 mg by mouth at bedtime.) 90 tablet 3   traZODone (DESYREL) 100 MG tablet Take 100 mg by mouth at bedtime as needed for sleep.     venlafaxine XR (EFFEXOR-XR) 75 MG 24 hr capsule Take 75 mg by mouth daily with breakfast.     ALPRAZolam (XANAX) 0.5 MG tablet Take 0.5 mg by mouth 2 (two)  times daily as needed for sleep.      Cholecalciferol 50 MCG (2000 UT) TABS Take 2,000 Units by mouth daily.      docusate sodium (COLACE) 100 MG capsule Take 1 capsule (100 mg total) by mouth 2 (two) times daily. While taking narcotic pain medicine. (Patient not taking: Reported on 03/18/2022) 30 capsule 0   fluticasone (FLONASE) 50 MCG/ACT nasal spray Place 1 spray into both nostrils as needed for allergies or rhinitis.     Polyethyl Glycol-Propyl Glycol (SYSTANE OP) Place 1-2 drops into both eyes as needed (dry eyes).      No results found for this or any previous visit (from the past 48 hour(s)). No results found.  Review of Systems  no recent f/c/n/v/wt loss  Blood pressure 139/66, pulse 72, temperature 98 F (36.7 C), height '5\' 8"'$  (1.727 m), weight 113.4 kg, SpO2 96 %. Physical Exam  Wn wd woman in nad.  A and O.  EOMI.  Resp unlabored.  L foot with 2nd toe amputation.  Distal end of incision with no active ulcer but a small scab at the healing incision.  No signs of infection.  2+ DP pulse.  Intact skin o/w.    Assessment/Plan Left 2nd toe ulcer - to the OR for revision 2nd toe amputation.  The risks and benefits of the alternative treatment options have been discussed in detail.  The patient wishes to proceed with surgery and specifically understands risks of bleeding, infection, nerve damage, blood clots, need for additional surgery, amputation and death.   Wylene Simmer, MD 05/20/2022, 7:16 AM

## 2022-04-27 NOTE — Transfer of Care (Signed)
Immediate Anesthesia Transfer of Care Note  Patient: [name]  Procedure(s) Performed: AMPUTATION REVISION 2ND TOE (Left: Toe)  Patient Location: PACU  Anesthesia Type:MAC  Level of Consciousness: awake, alert , oriented, and drowsy  Airway & Oxygen Therapy: Patient Spontanous Breathing and Patient connected to face mask oxygen  Post-op Assessment: Report given to RN and Post -op Vital signs reviewed and stable  Post vital signs: Reviewed and stable  Last Vitals:  Vitals Value Taken Time  BP    Temp    Pulse    Resp    SpO2      Last Pain:  Vitals:   04/27/22 0625  PainSc: 0-No pain         Complications: No notable events documented.

## 2022-04-27 NOTE — Anesthesia Postprocedure Evaluation (Signed)
Anesthesia Post Note  Patient: PSALMS OLARTE  Procedure(s) Performed: AMPUTATION REVISION 2ND TOE (Left: Toe)     Patient location during evaluation: PACU Anesthesia Type: MAC Level of consciousness: awake and alert Pain management: pain level controlled Vital Signs Assessment: post-procedure vital signs reviewed and stable Respiratory status: spontaneous breathing, nonlabored ventilation and respiratory function stable Cardiovascular status: blood pressure returned to baseline and stable Postop Assessment: no apparent nausea or vomiting Anesthetic complications: no   No notable events documented.  Last Vitals:  Vitals:   04/27/22 0815 04/27/22 0846  BP: 132/85 (!) 148/80  Pulse: 64 70  Resp: 18   Temp:  36.6 C  SpO2: 95% 95%    Last Pain:  Vitals:   04/27/22 0846  TempSrc: Oral  PainSc: 0-No pain                 Lynda Rainwater

## 2022-04-27 NOTE — Anesthesia Procedure Notes (Signed)
Procedure Name: MAC Date/Time: 04/27/2022 7:43 AM  Performed by: Signe Colt, CRNAPre-anesthesia Checklist: Patient identified, Emergency Drugs available, Suction available and Patient being monitored Patient Re-evaluated:Patient Re-evaluated prior to induction Oxygen Delivery Method: Simple face mask

## 2022-04-27 NOTE — Op Note (Signed)
04/27/2022  8:06 AM  PATIENT:  [name]  66 y.o. female  PRE-OPERATIVE DIAGNOSIS:  left 2nd toe ulcer  POST-OPERATIVE DIAGNOSIS:  same  Procedure(s):  left 2nd toe revision amputation at the middle phalanx  SURGEON:  Wylene Simmer, MD  ASSISTANT: none  ANESTHESIA:   local, MAC  EBL:  minimal   TOURNIQUET:   Total Tourniquet Time Documented: Calf (Left) - 14 minutes Total: Calf (Left) - 14 minutes  COMPLICATIONS:  None apparent  DISPOSITION:  Extubated, awake and stable to recovery.  INDICATION FOR PROCEDURE: 66 year old female with complicated past medical history is now 2 months out from second toe amputation through the DIP joint.  She has delayed healing with an ulcer at the end of the second toe.  She presents now for revision second toe amputation.  The risks and benefits of the alternative treatment options have been discussed in detail.  The patient wishes to proceed with surgery and specifically understands risks of bleeding, infection, nerve damage, blood clots, need for additional surgery, amputation and death.   PROCEDURE IN DETAIL: After preoperative consent was obtained and the correct operative site was identified, the patient was brought the operating room and placed supine on the operating table.  IV sedation was administered followed by preoperative antibiotics.  Surgical timeout was taken.  The left foot second toe was anesthetized with a metatarsal block using Marcaine with epinephrine.  The left lower extremity was then prepped and draped in standard sterile fashion.  The foot was exsanguinated and a 4 inch Esmarch tourniquet wrapped around the ankle.  A plantar flap incision was marked on the skin adjacent to the tip of the third toe.  The incision was made and dissection carried sharply down through the subcutaneous tissues to the middle phalanx.  Subperiosteal dissection was then carried distally and the dorsal and distal end of the toe removed.  This  incorporated all of the area of delayed healing and ulceration.  There was no evidence of infection deep to the incision.  A rondure was used to trim the middle phalanx back to the appropriate length.  The wound was irrigated copiously.  Neurovascular bundles were cauterized.  The wound was sprinkled with vancomycin powder.  This skin incision was closed with horizontal mattress sutures of 2-0 nylon.  Sterile dressings were applied followed by compression wrap.  The tourniquet was released after application of the dressings.  The patient was awakened from anesthesia and transported to the recovery room in stable condition.   FOLLOW UP PLAN: Weightbearing as tolerated in a flat postop shoe.  Follow-up in the office in 2 weeks for possible suture removal.

## 2022-04-27 NOTE — Discharge Instructions (Signed)

## 2022-04-28 ENCOUNTER — Encounter (HOSPITAL_BASED_OUTPATIENT_CLINIC_OR_DEPARTMENT_OTHER): Payer: Self-pay | Admitting: Orthopedic Surgery

## 2022-05-10 DIAGNOSIS — M79672 Pain in left foot: Secondary | ICD-10-CM | POA: Diagnosis not present

## 2022-05-24 DIAGNOSIS — I5032 Chronic diastolic (congestive) heart failure: Secondary | ICD-10-CM | POA: Diagnosis not present

## 2022-05-24 DIAGNOSIS — G4700 Insomnia, unspecified: Secondary | ICD-10-CM | POA: Diagnosis not present

## 2022-05-24 DIAGNOSIS — K219 Gastro-esophageal reflux disease without esophagitis: Secondary | ICD-10-CM | POA: Diagnosis not present

## 2022-05-24 DIAGNOSIS — M8580 Other specified disorders of bone density and structure, unspecified site: Secondary | ICD-10-CM | POA: Diagnosis not present

## 2022-05-24 DIAGNOSIS — R309 Painful micturition, unspecified: Secondary | ICD-10-CM | POA: Diagnosis not present

## 2022-05-24 DIAGNOSIS — E785 Hyperlipidemia, unspecified: Secondary | ICD-10-CM | POA: Diagnosis not present

## 2022-05-28 DIAGNOSIS — N390 Urinary tract infection, site not specified: Secondary | ICD-10-CM | POA: Diagnosis not present

## 2022-06-01 DIAGNOSIS — G14 Postpolio syndrome: Secondary | ICD-10-CM | POA: Diagnosis not present

## 2022-06-01 DIAGNOSIS — G8104 Flaccid hemiplegia affecting left nondominant side: Secondary | ICD-10-CM | POA: Diagnosis not present

## 2022-06-10 DIAGNOSIS — Z0000 Encounter for general adult medical examination without abnormal findings: Secondary | ICD-10-CM | POA: Diagnosis not present

## 2022-06-10 DIAGNOSIS — E785 Hyperlipidemia, unspecified: Secondary | ICD-10-CM | POA: Diagnosis not present

## 2022-06-10 DIAGNOSIS — G14 Postpolio syndrome: Secondary | ICD-10-CM | POA: Diagnosis not present

## 2022-06-10 DIAGNOSIS — Z952 Presence of prosthetic heart valve: Secondary | ICD-10-CM | POA: Diagnosis not present

## 2022-06-10 DIAGNOSIS — R7309 Other abnormal glucose: Secondary | ICD-10-CM | POA: Diagnosis not present

## 2022-06-10 DIAGNOSIS — I700 Atherosclerosis of aorta: Secondary | ICD-10-CM | POA: Diagnosis not present

## 2022-06-10 DIAGNOSIS — I5032 Chronic diastolic (congestive) heart failure: Secondary | ICD-10-CM | POA: Diagnosis not present

## 2022-06-23 DIAGNOSIS — R350 Frequency of micturition: Secondary | ICD-10-CM | POA: Diagnosis not present

## 2022-06-23 DIAGNOSIS — N390 Urinary tract infection, site not specified: Secondary | ICD-10-CM | POA: Diagnosis not present

## 2022-07-06 DIAGNOSIS — Z139 Encounter for screening, unspecified: Secondary | ICD-10-CM | POA: Diagnosis not present

## 2022-07-06 DIAGNOSIS — N8189 Other female genital prolapse: Secondary | ICD-10-CM | POA: Diagnosis not present

## 2022-07-13 DIAGNOSIS — H524 Presbyopia: Secondary | ICD-10-CM | POA: Diagnosis not present

## 2022-07-13 DIAGNOSIS — H52203 Unspecified astigmatism, bilateral: Secondary | ICD-10-CM | POA: Diagnosis not present

## 2022-07-13 DIAGNOSIS — Z961 Presence of intraocular lens: Secondary | ICD-10-CM | POA: Diagnosis not present

## 2022-07-16 ENCOUNTER — Telehealth (HOSPITAL_COMMUNITY): Payer: Self-pay | Admitting: Radiology

## 2022-07-16 DIAGNOSIS — N3020 Other chronic cystitis without hematuria: Secondary | ICD-10-CM | POA: Diagnosis not present

## 2022-07-16 DIAGNOSIS — N3946 Mixed incontinence: Secondary | ICD-10-CM | POA: Diagnosis not present

## 2022-07-16 NOTE — Telephone Encounter (Signed)
Pt called asking when she is due for follow-up. Per Dr. Debbrah Alar, she will be due for CTA h/n on or about 09/25/22. Caryl Pina or myself will call her around that time to schedule the scan. Pt agrees with this plan of care. JM

## 2022-09-01 DIAGNOSIS — R519 Headache, unspecified: Secondary | ICD-10-CM | POA: Diagnosis not present

## 2022-09-01 DIAGNOSIS — I5032 Chronic diastolic (congestive) heart failure: Secondary | ICD-10-CM | POA: Diagnosis not present

## 2022-09-01 DIAGNOSIS — G8104 Flaccid hemiplegia affecting left nondominant side: Secondary | ICD-10-CM | POA: Diagnosis not present

## 2022-09-01 DIAGNOSIS — I700 Atherosclerosis of aorta: Secondary | ICD-10-CM | POA: Diagnosis not present

## 2022-09-01 DIAGNOSIS — Z993 Dependence on wheelchair: Secondary | ICD-10-CM | POA: Diagnosis not present

## 2022-09-01 DIAGNOSIS — J019 Acute sinusitis, unspecified: Secondary | ICD-10-CM | POA: Diagnosis not present

## 2022-09-01 DIAGNOSIS — G14 Postpolio syndrome: Secondary | ICD-10-CM | POA: Diagnosis not present

## 2022-09-13 ENCOUNTER — Telehealth (HOSPITAL_COMMUNITY): Payer: Self-pay | Admitting: Radiology

## 2022-09-13 NOTE — Telephone Encounter (Signed)
Returned pt's call concerning her 6 month f/u MRI per Dr. Debbrah Alar. Let pt know that we have not received authorization for her scan just yet. I will call her back and scheduled as soon as we obtain the auth. Pt agrees with this plan of care. JM

## 2022-09-20 ENCOUNTER — Other Ambulatory Visit (HOSPITAL_COMMUNITY): Payer: Self-pay | Admitting: Neuroradiology

## 2022-09-20 DIAGNOSIS — I671 Cerebral aneurysm, nonruptured: Secondary | ICD-10-CM

## 2022-09-27 DIAGNOSIS — N8182 Incompetence or weakening of pubocervical tissue: Secondary | ICD-10-CM | POA: Diagnosis not present

## 2022-10-08 ENCOUNTER — Ambulatory Visit (HOSPITAL_COMMUNITY)
Admission: RE | Admit: 2022-10-08 | Discharge: 2022-10-08 | Disposition: A | Discharge status: Home / Self Care | Payer: Medicare HMO | Source: Ambulatory Visit | Attending: Neuroradiology | Admitting: Neuroradiology

## 2022-10-08 DIAGNOSIS — I771 Stricture of artery: Secondary | ICD-10-CM | POA: Diagnosis not present

## 2022-10-08 DIAGNOSIS — I671 Cerebral aneurysm, nonruptured: Secondary | ICD-10-CM | POA: Diagnosis not present

## 2022-10-08 MED ORDER — IOHEXOL 350 MG/ML SOLN
75.0000 mL | Freq: Once | INTRAVENOUS | Status: AC | PRN
  Administered 2022-10-08: 75 mL via INTRAVENOUS

## 2022-10-08 MED ORDER — SODIUM CHLORIDE (PF) 0.9 % IJ SOLN
INTRAMUSCULAR | Status: AC
  Filled 2022-10-08: qty 50

## 2022-10-13 ENCOUNTER — Telehealth (HOSPITAL_COMMUNITY): Payer: Self-pay

## 2022-10-13 NOTE — Telephone Encounter (Signed)
Pt agreed to f/u in 1 year with a cta head/neck. AB 

## 2022-10-14 DIAGNOSIS — R0981 Nasal congestion: Secondary | ICD-10-CM | POA: Diagnosis not present

## 2022-11-30 DIAGNOSIS — G8104 Flaccid hemiplegia affecting left nondominant side: Secondary | ICD-10-CM | POA: Diagnosis not present

## 2022-11-30 DIAGNOSIS — G14 Postpolio syndrome: Secondary | ICD-10-CM | POA: Diagnosis not present

## 2022-12-02 DIAGNOSIS — R7309 Other abnormal glucose: Secondary | ICD-10-CM | POA: Diagnosis not present

## 2022-12-02 DIAGNOSIS — E785 Hyperlipidemia, unspecified: Secondary | ICD-10-CM | POA: Diagnosis not present

## 2022-12-02 DIAGNOSIS — G14 Postpolio syndrome: Secondary | ICD-10-CM | POA: Diagnosis not present

## 2022-12-02 DIAGNOSIS — F418 Other specified anxiety disorders: Secondary | ICD-10-CM | POA: Diagnosis not present

## 2022-12-02 DIAGNOSIS — Z89422 Acquired absence of other left toe(s): Secondary | ICD-10-CM | POA: Diagnosis not present

## 2022-12-09 DIAGNOSIS — E785 Hyperlipidemia, unspecified: Secondary | ICD-10-CM | POA: Diagnosis not present

## 2022-12-09 DIAGNOSIS — R7309 Other abnormal glucose: Secondary | ICD-10-CM | POA: Diagnosis not present

## 2022-12-09 DIAGNOSIS — Z6839 Body mass index (BMI) 39.0-39.9, adult: Secondary | ICD-10-CM | POA: Diagnosis not present

## 2022-12-09 DIAGNOSIS — I5032 Chronic diastolic (congestive) heart failure: Secondary | ICD-10-CM | POA: Diagnosis not present

## 2022-12-13 DIAGNOSIS — E119 Type 2 diabetes mellitus without complications: Secondary | ICD-10-CM | POA: Diagnosis not present

## 2022-12-13 DIAGNOSIS — I671 Cerebral aneurysm, nonruptured: Secondary | ICD-10-CM | POA: Diagnosis not present

## 2022-12-13 DIAGNOSIS — Z6841 Body Mass Index (BMI) 40.0 and over, adult: Secondary | ICD-10-CM | POA: Diagnosis not present

## 2022-12-28 DIAGNOSIS — N8189 Other female genital prolapse: Secondary | ICD-10-CM | POA: Diagnosis not present

## 2022-12-30 DIAGNOSIS — L720 Epidermal cyst: Secondary | ICD-10-CM | POA: Diagnosis not present

## 2022-12-30 DIAGNOSIS — H61031 Chondritis of right external ear: Secondary | ICD-10-CM | POA: Diagnosis not present

## 2022-12-30 DIAGNOSIS — R202 Paresthesia of skin: Secondary | ICD-10-CM | POA: Diagnosis not present

## 2022-12-30 DIAGNOSIS — L820 Inflamed seborrheic keratosis: Secondary | ICD-10-CM | POA: Diagnosis not present

## 2022-12-30 DIAGNOSIS — L853 Xerosis cutis: Secondary | ICD-10-CM | POA: Diagnosis not present

## 2022-12-30 DIAGNOSIS — D1801 Hemangioma of skin and subcutaneous tissue: Secondary | ICD-10-CM | POA: Diagnosis not present

## 2022-12-30 DIAGNOSIS — L538 Other specified erythematous conditions: Secondary | ICD-10-CM | POA: Diagnosis not present

## 2022-12-30 DIAGNOSIS — L821 Other seborrheic keratosis: Secondary | ICD-10-CM | POA: Diagnosis not present

## 2022-12-30 DIAGNOSIS — L814 Other melanin hyperpigmentation: Secondary | ICD-10-CM | POA: Diagnosis not present

## 2023-01-12 DIAGNOSIS — I10 Essential (primary) hypertension: Secondary | ICD-10-CM | POA: Insufficient documentation

## 2023-01-12 NOTE — Progress Notes (Unsigned)
Cardiology Office Note:   Date:  01/13/2023  ID:  ROWEN Gonzales, DOB [birth year], MRN [id number] PCP: Debra Has, MD   [facility] Providers Cardiologist:  Rollene Rotunda, MD Cardiology APP:  Marcelino Duster, PA {  History of Present Illness:   [name] is a 67 y.o. female  who presents for follow up of severe aortic stenosis s/p TAVR (04/17/19).   Since I last saw her she Gonzales lost an unspecified amount of weight on Mounjaro.  She is very thrilled with this.  She feels better than she Gonzales in a while.  Because she Gonzales postpolio syndrome and is in a wheelchair she Gonzales to use her arms to transfer herself and she Gonzales more strength to do this.  She denies any new cardiovascular symptoms such as shortness of breath, PND or orthopnea.  She Gonzales had no palpitations, presyncope or syncope.  After appearing news 2 she finally got her CPAP.  She feels good with this.  ROS: As stated in the HPI and negative for all other systems.  Studies Reviewed:    EKG:   EKG Interpretation Date/Time:  Thursday January 13 2023 12:15:05 EDT Ventricular Rate:  74 PR Interval:  168 QRS Duration:  94 QT Interval:  426 QTC Calculation: 472 R Axis:   -37  Text Interpretation: Normal sinus rhythm Left axis deviation Moderate voltage criteria for LVH, may be normal variant ( R in aVL , Cornell product ) When compared with ECG of 03-Mar-2022 21:16, No significant change since last tracing Confirmed by Rollene Rotunda ([id number]) on 01/13/2023 12:50:49 PM     Risk Assessment/Calculations:              Physical Exam:   VS:  BP 122/72 (BP Location: Left Arm, Patient Position: Sitting, Cuff Size: Normal)   Pulse 74   Ht 5\' 8"  (1.727 m)   Wt 275 lb (124.7 kg)   SpO2 95%   BMI 41.81 kg/m    Wt Readings from Last 3 Encounters:  01/13/23 275 lb (124.7 kg)  04/27/22 250 lb (113.4 kg)  03/23/22 250 lb (113.4 kg)     GEN: Well nourished, well developed in no acute distress NECK: No JVD; No carotid  bruits CARDIAC: RRR, very soft systolic murmur, no diastolic murmurs, rubs, gallops RESPIRATORY:  Clear to auscultation without rales, wheezing or rhonchi  ABDOMEN: Soft, non-tender, non-distended EXTREMITIES:  No edema; No deformity   ASSESSMENT AND PLAN:   Severe AS/status post TAVR:    She Gonzales stable valve replacements on her last echo in 2022.  She finds a very difficult to get the echoes because of her mobility issues and there is no change in symptoms other than improvements and she Gonzales no change in her physical exam so I will hold off another year following up with an echo and I will repeat this prior to her visit next year.  Chronic diastolic CHF:    She is euvolemic.  No change in therapy.   OSA:    She is happy with that.  No change in therapy.  HTN: Her blood pressure is at target.  No change in therapy.   Dyslipidemia:    She was put on statin because of her valve.  She had no coronary disease.  She does not want to take statin and I think it is reasonable for her to come off particularly as she is lost weight and her diet is improved.  She is working with a  nutritionist.  I will stop the Crestor.         Follow up with me next year.   Signed, Rollene Rotunda, MD

## 2023-01-13 ENCOUNTER — Ambulatory Visit: Payer: Medicare HMO | Attending: Cardiology | Admitting: Cardiology

## 2023-01-13 ENCOUNTER — Encounter: Payer: Self-pay | Admitting: Cardiology

## 2023-01-13 VITALS — BP 122/72 | HR 74 | Ht 68.0 in | Wt 275.0 lb

## 2023-01-13 DIAGNOSIS — I5032 Chronic diastolic (congestive) heart failure: Secondary | ICD-10-CM | POA: Diagnosis not present

## 2023-01-13 DIAGNOSIS — I10 Essential (primary) hypertension: Secondary | ICD-10-CM | POA: Diagnosis not present

## 2023-01-13 DIAGNOSIS — I350 Nonrheumatic aortic (valve) stenosis: Secondary | ICD-10-CM | POA: Diagnosis not present

## 2023-01-13 NOTE — Addendum Note (Signed)
Addended by: Freddi Starr on: 01/13/2023 01:01 PM   Modules accepted: Orders

## 2023-01-13 NOTE — Patient Instructions (Addendum)
STOP ROSUVASTATIN  Testing/Procedures:  Your physician has requested that you have an echocardiogram. Echocardiography is a painless test that uses sound waves to create images of your heart. It provides your doctor with information about the size and shape of your heart and how well your heart's chambers and valves are working. This procedure takes approximately one hour. There are no restrictions for this procedure. Please do NOT wear cologne, perfume, aftershave, or lotions (deodorant is allowed). Please arrive 15 minutes prior to your appointment time. [address]-SCHEDULE JULY 2025   Follow-Up: At [facility], you and your health needs are our priority.  As part of our continuing mission to provide you with exceptional heart care, we have created designated Provider Care Teams.  These Care Teams include your primary Cardiologist (physician) and Advanced Practice Providers (APPs -  Physician Assistants and Nurse Practitioners) who all work together to provide you with the care you need, when you need it.  We recommend signing up for the patient portal called "MyChart".  Sign up information is provided on this After Visit Summary.  MyChart is used to connect with patients for Virtual Visits (Telemedicine).  Patients are able to view lab/test results, encounter notes, upcoming appointments, etc.  Non-urgent messages can be sent to your provider as well.   To learn more about what you can do with MyChart, go to [email or web address].    Your next appointment:    JULY AFTER ECHO IS COMPLETE

## 2023-01-24 DIAGNOSIS — Z6841 Body Mass Index (BMI) 40.0 and over, adult: Secondary | ICD-10-CM | POA: Diagnosis not present

## 2023-01-24 DIAGNOSIS — I5032 Chronic diastolic (congestive) heart failure: Secondary | ICD-10-CM | POA: Diagnosis not present

## 2023-01-24 DIAGNOSIS — E785 Hyperlipidemia, unspecified: Secondary | ICD-10-CM | POA: Diagnosis not present

## 2023-02-22 DIAGNOSIS — R112 Nausea with vomiting, unspecified: Secondary | ICD-10-CM | POA: Diagnosis not present

## 2023-02-22 DIAGNOSIS — Z23 Encounter for immunization: Secondary | ICD-10-CM | POA: Diagnosis not present

## 2023-02-22 DIAGNOSIS — G4733 Obstructive sleep apnea (adult) (pediatric): Secondary | ICD-10-CM | POA: Diagnosis not present

## 2023-02-22 DIAGNOSIS — E119 Type 2 diabetes mellitus without complications: Secondary | ICD-10-CM | POA: Diagnosis not present

## 2023-03-09 DIAGNOSIS — H1031 Unspecified acute conjunctivitis, right eye: Secondary | ICD-10-CM | POA: Diagnosis not present

## 2023-03-14 DIAGNOSIS — R8279 Other abnormal findings on microbiological examination of urine: Secondary | ICD-10-CM | POA: Diagnosis not present

## 2023-03-14 DIAGNOSIS — Z1231 Encounter for screening mammogram for malignant neoplasm of breast: Secondary | ICD-10-CM | POA: Diagnosis not present

## 2023-03-17 DIAGNOSIS — F418 Other specified anxiety disorders: Secondary | ICD-10-CM | POA: Diagnosis not present

## 2023-03-17 DIAGNOSIS — E119 Type 2 diabetes mellitus without complications: Secondary | ICD-10-CM | POA: Diagnosis not present

## 2023-03-17 DIAGNOSIS — Z993 Dependence on wheelchair: Secondary | ICD-10-CM | POA: Diagnosis not present

## 2023-03-17 DIAGNOSIS — Z89422 Acquired absence of other left toe(s): Secondary | ICD-10-CM | POA: Diagnosis not present

## 2023-03-17 DIAGNOSIS — G14 Postpolio syndrome: Secondary | ICD-10-CM | POA: Diagnosis not present

## 2023-03-17 DIAGNOSIS — E785 Hyperlipidemia, unspecified: Secondary | ICD-10-CM | POA: Diagnosis not present

## 2023-03-22 DIAGNOSIS — I5032 Chronic diastolic (congestive) heart failure: Secondary | ICD-10-CM | POA: Diagnosis not present

## 2023-03-22 DIAGNOSIS — Z6835 Body mass index (BMI) 35.0-35.9, adult: Secondary | ICD-10-CM | POA: Diagnosis not present

## 2023-03-22 DIAGNOSIS — E119 Type 2 diabetes mellitus without complications: Secondary | ICD-10-CM | POA: Diagnosis not present

## 2023-03-22 DIAGNOSIS — E785 Hyperlipidemia, unspecified: Secondary | ICD-10-CM | POA: Diagnosis not present

## 2023-03-29 DIAGNOSIS — N8189 Other female genital prolapse: Secondary | ICD-10-CM | POA: Diagnosis not present

## 2023-03-30 DIAGNOSIS — G4733 Obstructive sleep apnea (adult) (pediatric): Secondary | ICD-10-CM | POA: Diagnosis not present

## 2023-04-14 DIAGNOSIS — G4734 Idiopathic sleep related nonobstructive alveolar hypoventilation: Secondary | ICD-10-CM | POA: Diagnosis not present

## 2023-04-14 DIAGNOSIS — G4733 Obstructive sleep apnea (adult) (pediatric): Secondary | ICD-10-CM | POA: Diagnosis not present

## 2023-05-30 DIAGNOSIS — M25551 Pain in right hip: Secondary | ICD-10-CM | POA: Diagnosis not present

## 2023-06-13 ENCOUNTER — Emergency Department (HOSPITAL_BASED_OUTPATIENT_CLINIC_OR_DEPARTMENT_OTHER)
Admission: EM | Admit: 2023-06-13 | Discharge: 2023-06-13 | Disposition: A | Discharge status: Home / Self Care | Payer: Medicare HMO | Attending: Emergency Medicine | Admitting: Emergency Medicine

## 2023-06-13 ENCOUNTER — Emergency Department (HOSPITAL_BASED_OUTPATIENT_CLINIC_OR_DEPARTMENT_OTHER): Payer: Medicare HMO | Admitting: Radiology

## 2023-06-13 ENCOUNTER — Other Ambulatory Visit: Payer: Self-pay

## 2023-06-13 ENCOUNTER — Encounter (HOSPITAL_BASED_OUTPATIENT_CLINIC_OR_DEPARTMENT_OTHER): Payer: Self-pay

## 2023-06-13 DIAGNOSIS — I110 Hypertensive heart disease with heart failure: Secondary | ICD-10-CM | POA: Insufficient documentation

## 2023-06-13 DIAGNOSIS — E119 Type 2 diabetes mellitus without complications: Secondary | ICD-10-CM | POA: Insufficient documentation

## 2023-06-13 DIAGNOSIS — Z7982 Long term (current) use of aspirin: Secondary | ICD-10-CM | POA: Insufficient documentation

## 2023-06-13 DIAGNOSIS — R0781 Pleurodynia: Secondary | ICD-10-CM | POA: Diagnosis not present

## 2023-06-13 DIAGNOSIS — I509 Heart failure, unspecified: Secondary | ICD-10-CM | POA: Insufficient documentation

## 2023-06-13 DIAGNOSIS — R079 Chest pain, unspecified: Secondary | ICD-10-CM | POA: Diagnosis not present

## 2023-06-13 MED ORDER — LIDOCAINE 5 % EX PTCH
2.0000 | MEDICATED_PATCH | CUTANEOUS | Status: DC
  Administered 2023-06-13: 2 via TRANSDERMAL
  Filled 2023-06-13: qty 2

## 2023-06-13 MED ORDER — HYDROCODONE-ACETAMINOPHEN 5-325 MG PO TABS: 1.0000 | ORAL_TABLET | Freq: Once | ORAL | Status: DC

## 2023-06-13 MED ORDER — LIDOCAINE 5 % EX PTCH: 1.0000 | MEDICATED_PATCH | CUTANEOUS | 0 refills | Status: AC

## 2023-06-13 MED ORDER — HYDROCODONE-ACETAMINOPHEN 5-325 MG PO TABS: 2.0000 | ORAL_TABLET | Freq: Four times a day (QID) | ORAL | 0 refills | Status: AC | PRN

## 2023-06-13 NOTE — Discharge Instructions (Addendum)
As discussed, chest x-ray did show evidence of rib fracture which is most likely causing symptoms.  Will send you home with lidocaine numbing patches as well as medication to take as needed for pain.  Continue use incentive spirometry at home to decrease likelihood of development of a pneumonia.  Please do not hesitate to return if the worrisome signs and symptoms we discussed become apparent.

## 2023-06-13 NOTE — ED Provider Notes (Signed)
[place] EMERGENCY DEPARTMENT AT [facility] Provider Note   CSN: [id number] Arrival date & time: 06/13/23  1049     History  Chief Complaint  Patient presents with   Rib Injury    [name] is a 67 y.o. female.  HPI   67 year old female presents emergency department with complaints of right rib pain.  Patient states that symptoms began on Saturday when she was leaning over in her car tried to pick up a drink that had spilled.  States that she has a history of polio and is wheelchair-bound.  States that the area on her right lower chest with her seatbelt rested pressed on her right rib causing pain.  States that she is had persistent pain since then.  States that she heard a cracking type sensation when she leaned over the first time.  Has been taking Tylenol which has helped some with symptoms.  Denies abdominal pain, nausea, vomiting, trauma elsewhere  Past medical history significant for osteopenia polio, CHF, diverticulitis, hypertension, hyperlipidemia, diabetes mellitus  Home Medications Prior to Admission medications   Medication Sig Start Date End Date Taking? Authorizing Provider  HYDROcodone-acetaminophen (NORCO/VICODIN) 5-325 MG tablet Take 2 tablets by mouth every 6 (six) hours as needed. 06/13/23  Yes Sherian Maroon A, PA  lidocaine (LIDODERM) 5 % Place 1 patch onto the skin daily. Remove & Discard patch within 12 hours or as directed by MD 06/13/23  Yes Peter Garter, PA  acetaminophen (TYLENOL) 500 MG tablet Take 1,000 mg by mouth every 6 (six) hours as needed for moderate pain or headache.    [provider]  ALPRAZolam Prudy Feeler) 0.5 MG tablet Take 0.5 mg by mouth 2 (two) times daily as needed for sleep.     [provider]  Aspirin 81 MG CAPS Take 1 mg by mouth daily.    [provider]  cetirizine (ZYRTEC) 10 MG tablet Take 10 mg by mouth as needed for allergies.    [provider]  Cholecalciferol 50 MCG  (2000 UT) TABS Take 2,000 Units by mouth daily.     [provider]  Docusate Sodium (COLACE PO) Take 2 capsules by mouth daily.    [provider]  docusate sodium (COLACE) 100 MG capsule Take 1 capsule (100 mg total) by mouth 2 (two) times daily. While taking narcotic pain medicine. 02/18/22   Jacinta Shoe, PA-C  fluticasone [facility]) 50 MCG/ACT nasal spray Place 1 spray into both nostrils as needed for allergies or rhinitis.    [provider]  gabapentin (NEURONTIN) 300 MG capsule Take 300 mg by mouth daily.    [provider]  ibuprofen (ADVIL) 200 MG tablet Take 200 mg by mouth every 6 (six) hours as needed for moderate pain.    [provider]  MOUNJARO 5 MG/0.5ML Pen Inject 5 mg into the skin once a week. 01/08/23   [provider]  pantoprazole (PROTONIX) 40 MG tablet Take 40 mg by mouth daily.    [provider]  Polyethyl Glycol-Propyl Glycol (SYSTANE OP) Place 1-2 drops into both eyes as needed (dry eyes).    [provider]  traZODone (DESYREL) 100 MG tablet Take 100 mg by mouth at bedtime as needed for sleep.    [provider]  venlafaxine XR (EFFEXOR-XR) 75 MG 24 hr capsule Take 75 mg by mouth daily with breakfast.    [provider]      Allergies    Amoxicillin, Flagyl [metronidazole],  Plaquenil [hydroxychloroquine sulfate], Acetaminophen-codeine, Codeine, Doxycycline, Percocet [oxycodone-acetaminophen], and Sulfa antibiotics    Review of Systems   Review of Systems  All other systems reviewed and are negative.   Physical Exam Updated Vital Signs BP (!) 141/88   Pulse 71   Temp 98.7 F (37.1 C)   Resp 16   SpO2 96%  Physical Exam Vitals and nursing note reviewed.  Constitutional:      General: She is not in acute distress.    Appearance: She is well-developed.  HENT:     Head: Normocephalic and atraumatic.  Eyes:     Conjunctiva/sclera: Conjunctivae normal.   Cardiovascular:     Rate and Rhythm: Normal rate and regular rhythm.  Pulmonary:     Effort: Pulmonary effort is normal. No respiratory distress.     Breath sounds: Normal breath sounds. No wheezing, rhonchi or rales.     Comments: Right lower chest wall tenderness along ribs 10 through 12 spans anterior to lateral right lower chest wall.  No obvious palpable crepitus/step-off. Abdominal:     Palpations: Abdomen is soft.     Tenderness: There is no abdominal tenderness. There is no guarding.  Musculoskeletal:        General: No swelling.     Cervical back: Neck supple.  Skin:    General: Skin is warm and dry.     Capillary Refill: Capillary refill takes less than 2 seconds.  Neurological:     Mental Status: She is alert.  Psychiatric:        Mood and Affect: Mood normal.     ED Results / Procedures / Treatments   Labs (all labs ordered are listed, but only abnormal results are displayed) Labs Reviewed - No data to display  EKG None  Radiology DG Ribs Unilateral W/Chest Right Result Date: 06/13/2023 CLINICAL DATA:  Right chest pain after injury EXAM: RIGHT RIBS AND CHEST - 3+ VIEW COMPARISON:  01/10/2022 chest radiograph. FINDINGS: TAVR in place. Stable cardiomediastinal silhouette with normal heart size. No pneumothorax. No pleural effusion. No pulmonary edema. No acute consolidative airspace disease. Lucency in the lateral right tenth rib, suspicious for nondisplaced fracture. No additional potential right rib fractures. No focal osseous lesions. Surgical clips are seen in the right upper quadrant of the abdomen. IMPRESSION: Suspected nondisplaced acute lateral right tenth fracture. No pneumothorax. No active cardiopulmonary disease. Electronically Signed   By: Delbert Phenix M.D.   On: 06/13/2023 12:35    Procedures Procedures    Medications Ordered in ED Medications  HYDROcodone-acetaminophen (NORCO/VICODIN) 5-325 MG per tablet 1 tablet (1 tablet Oral Patient Refused/Not  Given 06/13/23 1156)  lidocaine (LIDODERM) 5 % 2 patch (2 patches Transdermal Patch Applied 06/13/23 1150)    ED Course/ Medical Decision Making/ A&P                                 Medical Decision Making Amount and/or Complexity of Data Reviewed Radiology: ordered.  Risk Prescription drug management.   This patient presents to the ED for concern of rib pain, this involves an extensive number of treatment options, and is a complaint that carries with it a high risk of complications and morbidity.  The differential diagnosis includes fracture, dislocation, strain/pain, ACS, pneumothorax, other   Co morbidities that complicate the patient evaluation  See HPI   Additional history obtained:  Additional history obtained from EMR External records from outside source obtained and reviewed  including hospital records   Lab Tests:  N/a   Imaging Studies ordered:  I ordered imaging studies including x-ray chest with right ribs I independently visualized and interpreted imaging which showed right lateral 10th rib fracture.  No pneumothorax or other cardiopulmonary abnormality. I agree with the radiologist interpretation   Cardiac Monitoring: / EKG:  The patient was maintained on a cardiac monitor.  I personally viewed and interpreted the cardiac monitored which showed an underlying rhythm of: Sinus rhythm   Consultations Obtained:  N/a   Problem List / ED Course / Critical interventions / Medication management  Right rib pain I ordered medication including Norco, Lidoderm   Reevaluation of the patient after these medicines showed that the patient improved I have reviewed the patients home medicines and have made adjustments as needed   Social Determinants of Health:  Denies tobacco, illicit drug use   Test / Admission - Considered:  Right rib pain Vitals signs significant for hypertension blood pressure 141/88. Otherwise within normal range and stable  throughout visit. Imaging studies significant for: See above 67 year old female presents emergency department with complaints of right-sided rib pain.  Pain occurred on Saturday when she bent over and heard a popping sensation when her seatbelt pressed on her right lower ribs.  Reproducible tenderness in the area.  Chest x-ray concerning for isolated nondisplaced 10th rib fracture without pneumothorax or other abnormality.  Patient treated with medications in the emergency department and noted improvement.  Will send home with incentive spirometry and medication to help with patient's discomfort.  Follow-up with primary care recommended for reevaluation.  Treatment plan discussed at length with patient and she acknowledged understanding was agreeable to said plan.  Patient overall well-appearing, afebrile in no acute distress. Worrisome signs and symptoms were discussed with the patient, and the patient acknowledged understanding to return to the ED if noticed. Patient was stable upon discharge.          Final Clinical Impression(s) / ED Diagnoses Final diagnoses:  Rib pain on right side    Rx / DC Orders ED Discharge Orders          Ordered    lidocaine (LIDODERM) 5 %  Every 24 hours        06/13/23 1240    HYDROcodone-acetaminophen (NORCO/VICODIN) 5-325 MG tablet  Every 6 hours PRN        06/13/23 1240              Peter Garter, [place] 06/13/23 1255    Melene Plan, DO 06/13/23 1300

## 2023-06-13 NOTE — ED Triage Notes (Signed)
Pt advises, "I popped my rib." States she was riding in car, dropped her drink & "out of instinct, I reached for it & I guess my seatbelt snagged on it." Happened Saturday, continued/ worsened pain, states "it's on my pulling side." Associated SHOB "when I pull especially."

## 2023-06-13 NOTE — ED Notes (Addendum)
Discharge teaching performed. She has incentive spirometer from resp. Assisted patient back into motorized wheelchair. Called patient's brother for ride.

## 2023-08-02 DIAGNOSIS — E119 Type 2 diabetes mellitus without complications: Secondary | ICD-10-CM | POA: Diagnosis not present

## 2023-08-02 DIAGNOSIS — Z6834 Body mass index (BMI) 34.0-34.9, adult: Secondary | ICD-10-CM | POA: Diagnosis not present

## 2023-08-17 DIAGNOSIS — N8189 Other female genital prolapse: Secondary | ICD-10-CM | POA: Diagnosis not present

## 2023-08-19 DIAGNOSIS — E119 Type 2 diabetes mellitus without complications: Secondary | ICD-10-CM | POA: Diagnosis not present

## 2023-08-19 DIAGNOSIS — H52203 Unspecified astigmatism, bilateral: Secondary | ICD-10-CM | POA: Diagnosis not present

## 2023-08-19 DIAGNOSIS — Z961 Presence of intraocular lens: Secondary | ICD-10-CM | POA: Diagnosis not present

## 2023-08-22 DIAGNOSIS — Z23 Encounter for immunization: Secondary | ICD-10-CM | POA: Diagnosis not present

## 2023-08-22 DIAGNOSIS — Z0000 Encounter for general adult medical examination without abnormal findings: Secondary | ICD-10-CM | POA: Diagnosis not present

## 2023-08-22 DIAGNOSIS — G14 Postpolio syndrome: Secondary | ICD-10-CM | POA: Diagnosis not present

## 2023-08-22 DIAGNOSIS — I5032 Chronic diastolic (congestive) heart failure: Secondary | ICD-10-CM | POA: Diagnosis not present

## 2023-08-22 DIAGNOSIS — E119 Type 2 diabetes mellitus without complications: Secondary | ICD-10-CM | POA: Diagnosis not present

## 2023-08-22 DIAGNOSIS — Z952 Presence of prosthetic heart valve: Secondary | ICD-10-CM | POA: Diagnosis not present

## 2023-08-22 DIAGNOSIS — E785 Hyperlipidemia, unspecified: Secondary | ICD-10-CM | POA: Diagnosis not present

## 2023-08-22 DIAGNOSIS — I700 Atherosclerosis of aorta: Secondary | ICD-10-CM | POA: Diagnosis not present

## 2023-08-22 DIAGNOSIS — Z89422 Acquired absence of other left toe(s): Secondary | ICD-10-CM | POA: Diagnosis not present

## 2023-08-23 DIAGNOSIS — G14 Postpolio syndrome: Secondary | ICD-10-CM | POA: Diagnosis not present

## 2023-09-13 ENCOUNTER — Other Ambulatory Visit (HOSPITAL_COMMUNITY): Payer: Self-pay | Admitting: Interventional Radiology

## 2023-09-13 DIAGNOSIS — I671 Cerebral aneurysm, nonruptured: Secondary | ICD-10-CM

## 2023-09-14 ENCOUNTER — Telehealth (HOSPITAL_COMMUNITY): Payer: Self-pay | Admitting: Interventional Radiology

## 2023-09-14 NOTE — Telephone Encounter (Signed)
 Called pt to schedule f/u cta. She has decided not to schedule this exam. She does not plan on treatment so she doesn't want to schedule f/u's. She was advised to call if she changes her mind. She agreed with this plan. AB

## 2023-09-20 DIAGNOSIS — R1110 Vomiting, unspecified: Secondary | ICD-10-CM | POA: Diagnosis not present

## 2023-09-27 DIAGNOSIS — R748 Abnormal levels of other serum enzymes: Secondary | ICD-10-CM | POA: Diagnosis not present

## 2023-09-27 DIAGNOSIS — R109 Unspecified abdominal pain: Secondary | ICD-10-CM | POA: Diagnosis not present

## 2023-10-06 DIAGNOSIS — H938X3 Other specified disorders of ear, bilateral: Secondary | ICD-10-CM | POA: Diagnosis not present

## 2023-10-06 DIAGNOSIS — J309 Allergic rhinitis, unspecified: Secondary | ICD-10-CM | POA: Diagnosis not present

## 2023-10-06 DIAGNOSIS — H9319 Tinnitus, unspecified ear: Secondary | ICD-10-CM | POA: Diagnosis not present

## 2023-10-06 DIAGNOSIS — G14 Postpolio syndrome: Secondary | ICD-10-CM | POA: Diagnosis not present

## 2023-10-13 DIAGNOSIS — H9203 Otalgia, bilateral: Secondary | ICD-10-CM | POA: Diagnosis not present

## 2023-10-13 DIAGNOSIS — H9313 Tinnitus, bilateral: Secondary | ICD-10-CM | POA: Diagnosis not present

## 2023-10-13 DIAGNOSIS — H906 Mixed conductive and sensorineural hearing loss, bilateral: Secondary | ICD-10-CM | POA: Diagnosis not present

## 2023-10-13 NOTE — Progress Notes (Signed)
 Otolaryngology Clinic Note  HPI:    Chief Complaint  Patient presents with  . Tinnitus   [name] is a 68 y.o. female who presents as a new patient for bilateral, non-pulsatile tinnitus. She describes the noise as a constant roaring noise. It sounds like a motor. It began about two years ago. Seems to be worsening. She prefers a quiet bedroom and has not tried masking. It interferes with her ability to sleep; she has tried a Xanax  at bedtime. Feels her hearing is good in both ears. No recent audiogram. She reports occasional ear pain bilaterally in front of the ears.   Denies otorrhea, ear pruritus, dizziness, headache, sore throat, dysphagia, odynophagia, cough, hoarseness, nasal obstruction, rhinorrhea or acute vision changes. No dental pain/sensitivity. No cervicalgia.  No history of recurrent ear infections, ear surgeries or significant noise exposures.   Non-smoker.  PMH/Meds/All/SocHx/FamHx/ROS:   History reviewed. No pertinent past medical history.  History reviewed. No pertinent surgical history.  No family history of bleeding disorders, wound healing problems or difficulty with anesthesia.       Current Outpatient Medications:  .  ALPRAZolam  (XANAX ) 0.5 mg tablet, alprazolam  0.5 mg tablet, Disp: , Rfl:  .  aspirin -calcium  carbonate (Women's Aspirin  with Calcium ) 81 mg-300 mg calcium (777 mg) tab, Take 81 mg by mouth., Disp: , Rfl:  .  benzonatate  (TESSALON ) 100 mg capsule, benzonatate  100 mg capsule TAKE ONE CAPSULE BY MOUTH EVERY 8 HOURS AS NEEDED FOR COUGH, Disp: , Rfl:  .  loratadine  (CLARITIN ) 10 mg tablet, Take 10 mg by mouth., Disp: , Rfl:  .  meloxicam (MOBIC) 7.5 mg tablet, Take 7.5 mg by mouth., Disp: , Rfl:  .  oxymetazoline  (AFRIN) 0.05 % nasal spray, 1 spray., Disp: , Rfl:  .  traZODone  (DESYREL ) 100 mg tablet, trazodone  100 mg tablet, Disp: , Rfl:  .  venlafaxine  (EFFEXOR  XR) 75 mg 24 hr capsule, venlafaxine  ER 75 mg capsule,extended release 24 hr,  Disp: , Rfl:  .  acetaminophen  (TYLENOL ) 500 mg tablet, Take 1,000 mg by mouth., Disp: , Rfl:   A complete ROS was performed with pertinent positives/negatives noted in the HPI. The remainder of the ROS are negative.    Physical Exam:    There were no vitals taken for this visit.   General Awake, at baseline alertness during examination.  Eyes No scleral icterus or conjunctival hemorrhage. Globe position appears normal. EOMI.   Right Ear EAC patent, TM intact w/o inflammation. Middle ear well aerated.   Left Ear EAC patent, TM intact w/o inflammation. Middle ear well aerated.   Nose Patent, no polyps or masses seen on anterior rhinoscopy.  Oral cavity No mucosal lesions or tumors seen. Tongue midline.   Oropharynx Symmetric tonsils.   TMJ Non-tender; no clicking, popping, crepitus or subluxation.  Neck No abnormal cervical lymphadenopathy. No thyromegaly. No thyroid  masses palpated.  Cardio-vascular No cyanosis.  Pulmonary No audible stridor. Breathing easily with no labor.  Neuro Symmetric facial movement.   Psychiatry Appropriate affect and mood for clinic visit.   Independent Review of Additional Tests or Records:   Medical records.   Procedures:   Audiometry: pure tone audiogram demonstrates right ear with a mild loss from 250-500 Hz, rising to normal hearing for 1000-2000 Hz, with a sloping mild to moderate loss for 3000-8000 Hz.   Left ear demonstrates a mild loss from [id number] Hz, rising to normal hearing at 2000 Hz, with a sloping mild to moderate loss for 3000-8000 Hz.   Word  understanding: 92% both ears.  Tympanometry: type A bilaterally.    Impression & Plans:  [name] is a 68 y.o. female with 2-year history of non-pulsatile, bilateral tinnitus. Normal ear exam. 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 Excellent word understanding. Normal type A tympanometry. She is medically cleared for hearing aids  which I explained is a personal decision. I explained that hearing aids not only help with amplification but may help to mitigate tinnitus in up to 85% of patients. I recommend repeat audiogram in 1-2 years, sooner with any new concerns.  Tinnitus precautions advised. Recommend masking technique, avoidance of loud noise and use of ear protection in appropriate environments. Cautioned about use of over-the-counter tinnitus supplements as they are not FDA approved. Reading material provided.   We also discussed potential causes for referred otalgia. I recommend monitoring for possible jaw source (TMJ dysfunction). Symptomatic care measures discussed along with use of Ibuprofen, 600-800mg  TID with meals. Consider addressing this with the dentist if problem persists.   Follow up as needed with ENT. Patient agrees with the plan.   Velia Pry, PA-C [facility]

## 2023-10-26 DIAGNOSIS — G14 Postpolio syndrome: Secondary | ICD-10-CM | POA: Diagnosis not present

## 2023-10-26 DIAGNOSIS — G8104 Flaccid hemiplegia affecting left nondominant side: Secondary | ICD-10-CM | POA: Diagnosis not present

## 2023-11-17 DIAGNOSIS — N8189 Other female genital prolapse: Secondary | ICD-10-CM | POA: Diagnosis not present

## 2023-12-10 ENCOUNTER — Encounter (HOSPITAL_COMMUNITY): Payer: Self-pay | Admitting: Interventional Radiology

## 2023-12-15 DIAGNOSIS — G8104 Flaccid hemiplegia affecting left nondominant side: Secondary | ICD-10-CM | POA: Diagnosis not present

## 2023-12-15 DIAGNOSIS — G14 Postpolio syndrome: Secondary | ICD-10-CM | POA: Diagnosis not present

## 2023-12-27 ENCOUNTER — Ambulatory Visit (HOSPITAL_COMMUNITY)
Admission: RE | Admit: 2023-12-27 | Discharge: 2023-12-27 | Disposition: A | Discharge status: Home / Self Care | Payer: Medicare HMO | Source: Ambulatory Visit | Attending: Internal Medicine | Admitting: Internal Medicine

## 2023-12-27 DIAGNOSIS — I350 Nonrheumatic aortic (valve) stenosis: Secondary | ICD-10-CM | POA: Insufficient documentation

## 2023-12-27 LAB — ECHOCARDIOGRAM COMPLETE
AR max vel: 2.52 cm2
AV Area VTI: 2.92 cm2
AV Area mean vel: 2.82 cm2
AV Mean grad: 7.5 mmHg
AV Peak grad: 15.3 mmHg
Ao pk vel: 1.95 m/s
Area-P 1/2: 2.32 cm2
S' Lateral: 2.3 cm

## 2023-12-29 ENCOUNTER — Ambulatory Visit: Payer: Self-pay | Admitting: Cardiology

## 2024-01-15 NOTE — Progress Notes (Unsigned)
  Cardiology Office Note:   Date:  01/16/2024  ID:  LUGENE HITT, DOB [birth year], MRN [id number] PCP: Kip Righter, MD  [facility] Providers Cardiologist:  Lynwood Schilling, MD Cardiology APP:  Madie Jon Garre, PA {  History of Present Illness:   [name] is a 68 y.o. female who presents for follow up of severe aortic stenosis s/p TAVR (04/17/19).   The most recent follow up echo demonstrated normal left ventricular function.  The EF was 65 to 70%.  TAVR demonstrated normal function with a mean gradient of 7.5.  Since I last saw her she is now up to a 75 pound weight loss with her Mounjaro.  She is wheelchair-bound with paralysis but does notice she has more energy.  She is able to help her set transfer more.  She stopped using CPAP because she was snoring and was not as tired.  She denies any chest pressure, neck or arm discomfort.  She has had no new shortness of breath, PND or orthopnea.  She has had no palpitations, presyncope or syncope.  She has had no weight gain or edema.  ROS: As stated in the HPI and negative for all other systems.  Studies Reviewed:    EKG:   EKG Interpretation Date/Time:  Monday January 16 2024 12:20:18 EDT Ventricular Rate:  73 PR Interval:  176 QRS Duration:  86 QT Interval:  432 QTC Calculation: 475 R Axis:   89  Text Interpretation: Normal sinus rhythm Possible Lateral infarct , age undetermined When compared with ECG of 13-Jan-2023 12:15, QRS axis Shifted right Confirmed by Schilling Lynwood ([id number]) on 01/16/2024 12:34:27 PM     Risk Assessment/Calculations:              Physical Exam:   VS:  BP 131/82   Pulse 74   Ht 5' 8 (1.727 m)   Wt 200 lb (90.7 kg)   SpO2 94%   BMI 30.41 kg/m    Wt Readings from Last 3 Encounters:  01/16/24 200 lb (90.7 kg)  01/13/23 275 lb (124.7 kg)  04/27/22 250 lb (113.4 kg)     GEN: Well nourished, well developed in no acute distress NECK: No JVD; No carotid bruits CARDIAC: RRR, soft  brief apical systolic murmur nonradiating, no diastolic murmurs, rubs, gallops RESPIRATORY:  Clear to auscultation without rales, wheezing or rhonchi  ABDOMEN: Soft, non-tender, non-distended EXTREMITIES:  No edema; No deformity   ASSESSMENT AND PLAN:   Severe AS/status post TAVR:    She has stable aortic valve replacement.  No change in therapy.  I will follow this up with an echo in 2 years.  It is difficult for her to get on and off the echo tables.   Chronic diastolic CHF:    She is euvolemic.  No change in therapy.   OSA:    She took herself off CPAP.   HTN: Her blood pressure is at target.  No change in therapy.  She previously did not want to take statin.    Dyslipidemia:   LDL earlier this year was at 112.  No change in therapy.      Follow up with me in 1 year.  Signed, Lynwood Schilling, MD

## 2024-01-16 ENCOUNTER — Ambulatory Visit: Attending: Cardiology | Admitting: Cardiology

## 2024-01-16 ENCOUNTER — Encounter: Payer: Self-pay | Admitting: Cardiology

## 2024-01-16 VITALS — BP 131/82 | HR 74 | Ht 68.0 in | Wt 200.0 lb

## 2024-01-16 DIAGNOSIS — I350 Nonrheumatic aortic (valve) stenosis: Secondary | ICD-10-CM

## 2024-01-16 DIAGNOSIS — I5032 Chronic diastolic (congestive) heart failure: Secondary | ICD-10-CM | POA: Diagnosis not present

## 2024-01-16 DIAGNOSIS — E785 Hyperlipidemia, unspecified: Secondary | ICD-10-CM | POA: Diagnosis not present

## 2024-01-16 DIAGNOSIS — I10 Essential (primary) hypertension: Secondary | ICD-10-CM

## 2024-01-16 NOTE — Patient Instructions (Signed)

## 2024-02-16 DIAGNOSIS — N8189 Other female genital prolapse: Secondary | ICD-10-CM | POA: Diagnosis not present

## 2024-02-28 DIAGNOSIS — G14 Postpolio syndrome: Secondary | ICD-10-CM | POA: Diagnosis not present

## 2024-02-28 DIAGNOSIS — F418 Other specified anxiety disorders: Secondary | ICD-10-CM | POA: Diagnosis not present

## 2024-02-28 DIAGNOSIS — E119 Type 2 diabetes mellitus without complications: Secondary | ICD-10-CM | POA: Diagnosis not present

## 2024-02-28 DIAGNOSIS — Z23 Encounter for immunization: Secondary | ICD-10-CM | POA: Diagnosis not present

## 2024-02-28 DIAGNOSIS — I5032 Chronic diastolic (congestive) heart failure: Secondary | ICD-10-CM | POA: Diagnosis not present

## 2024-02-28 DIAGNOSIS — E785 Hyperlipidemia, unspecified: Secondary | ICD-10-CM | POA: Diagnosis not present

## 2024-03-16 DIAGNOSIS — G8104 Flaccid hemiplegia affecting left nondominant side: Secondary | ICD-10-CM | POA: Diagnosis not present

## 2024-03-16 DIAGNOSIS — G14 Postpolio syndrome: Secondary | ICD-10-CM | POA: Diagnosis not present

## 2024-03-20 DIAGNOSIS — B353 Tinea pedis: Secondary | ICD-10-CM | POA: Diagnosis not present

## 2024-03-20 DIAGNOSIS — L309 Dermatitis, unspecified: Secondary | ICD-10-CM | POA: Diagnosis not present

## 2024-04-06 DIAGNOSIS — Z1231 Encounter for screening mammogram for malignant neoplasm of breast: Secondary | ICD-10-CM | POA: Diagnosis not present
